# Patient Record
Sex: Male | Born: 1990 | Race: White | Hispanic: No | Marital: Single | Smoking: Former smoker
Health system: Southern US, Community
[De-identification: ages and names within clinical notes are randomized; demographics above are authoritative.]

## PROBLEM LIST (undated history)

## (undated) DIAGNOSIS — F329 Major depressive disorder, single episode, unspecified: Secondary | ICD-10-CM

## (undated) DIAGNOSIS — I10 Essential (primary) hypertension: Secondary | ICD-10-CM

## (undated) DIAGNOSIS — T7840XA Allergy, unspecified, initial encounter: Secondary | ICD-10-CM

## (undated) DIAGNOSIS — K219 Gastro-esophageal reflux disease without esophagitis: Secondary | ICD-10-CM

## (undated) DIAGNOSIS — R569 Unspecified convulsions: Secondary | ICD-10-CM

## (undated) DIAGNOSIS — F32A Depression, unspecified: Secondary | ICD-10-CM

## (undated) DIAGNOSIS — F319 Bipolar disorder, unspecified: Secondary | ICD-10-CM

## (undated) DIAGNOSIS — F419 Anxiety disorder, unspecified: Secondary | ICD-10-CM

## (undated) DIAGNOSIS — G4730 Sleep apnea, unspecified: Secondary | ICD-10-CM

## (undated) HISTORY — DX: Essential (primary) hypertension: I10

## (undated) HISTORY — DX: Major depressive disorder, single episode, unspecified: F32.9

## (undated) HISTORY — DX: Bipolar disorder, unspecified: F31.9

## (undated) HISTORY — DX: Allergy, unspecified, initial encounter: T78.40XA

## (undated) HISTORY — PX: WISDOM TOOTH EXTRACTION: SHX21

## (undated) HISTORY — PX: NO PAST SURGERIES: SHX2092

## (undated) HISTORY — DX: Depression, unspecified: F32.A

## (undated) HISTORY — DX: Unspecified convulsions: R56.9

## (undated) HISTORY — DX: Sleep apnea, unspecified: G47.30

## (undated) HISTORY — DX: Gastro-esophageal reflux disease without esophagitis: K21.9

## (undated) HISTORY — DX: Anxiety disorder, unspecified: F41.9

---
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
## 2014-06-11 ENCOUNTER — Ambulatory Visit: Payer: Self-pay | Admitting: Family Medicine

## 2014-06-20 ENCOUNTER — Emergency Department: Payer: Self-pay | Admitting: Emergency Medicine

## 2014-06-20 LAB — CBC WITH DIFFERENTIAL/PLATELET
Basophil #: 0.1 10*3/uL (ref 0.0–0.1)
Basophil %: 0.3 %
EOS ABS: 0 10*3/uL (ref 0.0–0.7)
Eosinophil %: 0.1 %
HCT: 45.9 % (ref 40.0–52.0)
HGB: 15.4 g/dL (ref 13.0–18.0)
Lymphocyte #: 2 10*3/uL (ref 1.0–3.6)
Lymphocyte %: 6.5 %
MCH: 28.4 pg (ref 26.0–34.0)
MCHC: 33.5 g/dL (ref 32.0–36.0)
MCV: 85 fL (ref 80–100)
MONO ABS: 1.6 x10 3/mm — AB (ref 0.2–1.0)
Monocyte %: 5.2 %
NEUTROS PCT: 87.9 %
Neutrophil #: 27.1 10*3/uL — ABNORMAL HIGH (ref 1.4–6.5)
PLATELETS: 241 10*3/uL (ref 150–440)
RBC: 5.4 10*6/uL (ref 4.40–5.90)
RDW: 13.3 % (ref 11.5–14.5)
WBC: 30.7 10*3/uL — ABNORMAL HIGH (ref 3.8–10.6)

## 2014-06-20 LAB — URINALYSIS, COMPLETE
BLOOD: NEGATIVE
Bilirubin,UR: NEGATIVE
Glucose,UR: NEGATIVE mg/dL (ref 0–75)
LEUKOCYTE ESTERASE: NEGATIVE
NITRITE: NEGATIVE
Ph: 5 (ref 4.5–8.0)
Protein: 30
Specific Gravity: 1.038 (ref 1.003–1.030)
Squamous Epithelial: NONE SEEN
WBC UR: 1 /HPF (ref 0–5)

## 2014-06-20 LAB — BASIC METABOLIC PANEL
Anion Gap: 12 (ref 7–16)
BUN: 18 mg/dL
CALCIUM: 9.4 mg/dL
Chloride: 101 mmol/L
Co2: 24 mmol/L
Creatinine: 1.15 mg/dL
EGFR (African American): >60
Glucose: 117 mg/dL — ABNORMAL HIGH
POTASSIUM: 4 mmol/L
SODIUM: 137 mmol/L

## 2014-07-02 ENCOUNTER — Ambulatory Visit
Admit: 2014-07-02 | Disposition: A | Discharge status: Home / Self Care | Payer: Self-pay | Attending: Family Medicine | Admitting: Family Medicine

## 2014-07-23 ENCOUNTER — Ambulatory Visit
Admit: 2014-07-23 | Disposition: A | Discharge status: Home / Self Care | Payer: Self-pay | Attending: Family Medicine | Admitting: Family Medicine

## 2015-02-23 ENCOUNTER — Inpatient Hospital Stay
Admit: 2015-02-23 | Discharge: 2015-02-28 | DRG: 885 | Disposition: A | Discharge status: Home / Self Care | Payer: BLUE CROSS/BLUE SHIELD | Source: Intra-hospital | Attending: Psychiatry | Admitting: Psychiatry

## 2015-02-23 ENCOUNTER — Encounter: Payer: Self-pay | Admitting: *Deleted

## 2015-02-23 ENCOUNTER — Emergency Department
Admission: EM | Admit: 2015-02-23 | Discharge: 2015-02-23 | Disposition: A | Discharge status: Other Acute Inpatient Hospital | Payer: BLUE CROSS/BLUE SHIELD | Attending: Emergency Medicine | Admitting: Emergency Medicine

## 2015-02-23 DIAGNOSIS — R45850 Homicidal ideations: Secondary | ICD-10-CM | POA: Diagnosis present

## 2015-02-23 DIAGNOSIS — I10 Essential (primary) hypertension: Secondary | ICD-10-CM | POA: Diagnosis present

## 2015-02-23 DIAGNOSIS — R45851 Suicidal ideations: Secondary | ICD-10-CM

## 2015-02-23 DIAGNOSIS — F32A Depression, unspecified: Secondary | ICD-10-CM

## 2015-02-23 DIAGNOSIS — F332 Major depressive disorder, recurrent severe without psychotic features: Principal | ICD-10-CM | POA: Diagnosis present

## 2015-02-23 DIAGNOSIS — E663 Overweight: Secondary | ICD-10-CM | POA: Diagnosis present

## 2015-02-23 DIAGNOSIS — F419 Anxiety disorder, unspecified: Secondary | ICD-10-CM | POA: Diagnosis present

## 2015-02-23 DIAGNOSIS — F329 Major depressive disorder, single episode, unspecified: Secondary | ICD-10-CM | POA: Diagnosis not present

## 2015-02-23 DIAGNOSIS — G4700 Insomnia, unspecified: Secondary | ICD-10-CM | POA: Diagnosis present

## 2015-02-23 LAB — CBC
HCT: 45.2 % (ref 40.0–52.0)
HEMOGLOBIN: 15.3 g/dL (ref 13.0–18.0)
MCH: 27.7 pg (ref 26.0–34.0)
MCHC: 33.8 g/dL (ref 32.0–36.0)
MCV: 82 fL (ref 80.0–100.0)
Platelets: 188 10*3/uL (ref 150–440)
RBC: 5.51 MIL/uL (ref 4.40–5.90)
RDW: 12.9 % (ref 11.5–14.5)
WBC: 7.1 10*3/uL (ref 3.8–10.6)

## 2015-02-23 LAB — COMPREHENSIVE METABOLIC PANEL
ALBUMIN: 4.4 g/dL (ref 3.5–5.0)
ALK PHOS: 71 U/L (ref 38–126)
ALT: 58 U/L (ref 17–63)
AST: 28 U/L (ref 15–41)
Anion gap: 8 (ref 5–15)
BILIRUBIN TOTAL: 0.8 mg/dL (ref 0.3–1.2)
BUN: 18 mg/dL (ref 6–20)
CO2: 28 mmol/L (ref 22–32)
Calcium: 9.9 mg/dL (ref 8.9–10.3)
Chloride: 105 mmol/L (ref 101–111)
Creatinine, Ser: 1.03 mg/dL (ref 0.61–1.24)
GFR calc Af Amer: >60 mL/min (ref >60)
GLUCOSE: 113 mg/dL — AB (ref 65–99)
Potassium: 3.9 mmol/L (ref 3.5–5.1)
Sodium: 141 mmol/L (ref 135–145)
Total Protein: 7.8 g/dL (ref 6.5–8.1)

## 2015-02-23 LAB — URINE DRUG SCREEN, QUALITATIVE (ARMC ONLY)
Amphetamines, Ur Screen: NOT DETECTED
BARBITURATES, UR SCREEN: NOT DETECTED
Benzodiazepine, Ur Scrn: NOT DETECTED
CANNABINOID 50 NG, UR ~~LOC~~: NOT DETECTED
COCAINE METABOLITE, UR ~~LOC~~: NOT DETECTED
MDMA (ECSTASY) UR SCREEN: NOT DETECTED
Methadone Scn, Ur: NOT DETECTED
Opiate, Ur Screen: NOT DETECTED
PHENCYCLIDINE (PCP) UR S: NOT DETECTED
TRICYCLIC, UR SCREEN: NOT DETECTED

## 2015-02-23 LAB — TSH: TSH: 3.197 u[IU]/mL (ref 0.350–4.500)

## 2015-02-23 LAB — ETHANOL: Alcohol, Ethyl (B): <5 mg/dL (ref <5)

## 2015-02-23 MED ORDER — FLUOXETINE HCL 20 MG PO CAPS
20.0000 mg | ORAL_CAPSULE | Freq: Every day | ORAL | Status: DC
  Administered 2015-02-23 – 2015-02-28 (×6): 20 mg via ORAL
  Filled 2015-02-23 (×6): qty 1

## 2015-02-23 MED ORDER — ACETAMINOPHEN 325 MG PO TABS: 650.0000 mg | ORAL_TABLET | Freq: Four times a day (QID) | ORAL | Status: DC | PRN

## 2015-02-23 MED ORDER — FLUOXETINE HCL 20 MG PO CAPS: 20.0000 mg | ORAL_CAPSULE | Freq: Every day | ORAL | Status: DC

## 2015-02-23 MED ORDER — MAGNESIUM HYDROXIDE 400 MG/5ML PO SUSP: 30.0000 mL | Freq: Every day | ORAL | Status: DC | PRN

## 2015-02-23 MED ORDER — ALUM & MAG HYDROXIDE-SIMETH 200-200-20 MG/5ML PO SUSP: 30.0000 mL | ORAL | Status: DC | PRN
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
## 2015-02-23 NOTE — ED Notes (Signed)
Pt ready for transport to the unit downstairs. Waiting on an officer to escort the pt. Report has been called to Dole FoodCaroline RN.

## 2015-02-23 NOTE — ED Notes (Signed)
Pt requesting to speak to a chaplain. Chaplain being paged at this time. Pt made aware.
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
## 2015-02-23 NOTE — ED Notes (Signed)
Pt speaking with psychiatrist
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
## 2015-02-23 NOTE — ED Notes (Signed)
Pt sitting in day room watching tv. No complaints.   Q 15 min checks continue. Monitored for safety.

## 2015-02-23 NOTE — ED Notes (Signed)
Pt asked to make a phone call. Went to room to make call.

## 2015-02-23 NOTE — Progress Notes (Signed)
   02/23/15 1745  Clinical Encounter Type  Visited With Patient  Visit Type Initial;Behavioral Health  Referral From Nurse  Consult/Referral To Chaplain  Spiritual Encounters  Spiritual Needs Literature;Prayer;Emotional  Stress Factors  Patient Stress Factors Family relationships  [facility]. Upset about recent break up in relationship. Wanted someone to talk to and say a prayer. Chaplain Cordelia PocheEmily Syed Zukas
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
## 2015-02-23 NOTE — ED Notes (Signed)
Pt eating diner in his room.

## 2015-02-23 NOTE — Discharge Instructions (Signed)
You were evaluated today for depression and anxiety with some suicidal thoughts. After evaluation by the psychiatrist Dr. Toni Amendlapacs, he is recommending you start on fluoxetine and has provided a prescription for 1 month. You do need to follow up with a psychiatrist, and you may find a private psychiatrist through your insurance referral, or at [facility].  Also follow up with your counselor in the next 1-2 days.  Return to the emergency department immediately for any worsening depression, or any thoughts of wanting to hurt or kill yourself or others.    Major Depressive Disorder Major depressive disorder is a mental illness. It also may be called clinical depression or unipolar depression. Major depressive disorder usually causes feelings of sadness, hopelessness, or helplessness. Some people with this disorder do not feel particularly sad but lose interest in doing things they used to enjoy (anhedonia). Major depressive disorder also can cause physical symptoms. It can interfere with work, school, relationships, and other normal everyday activities. The disorder varies in severity but is longer lasting and more serious than the sadness we all feel from time to time in our lives. Major depressive disorder often is triggered by stressful life events or major life changes. Examples of these triggers include divorce, loss of your job or home, a move, and the death of a family member or close friend. Sometimes this disorder occurs for no obvious reason at all. People who have family members with major depressive disorder or bipolar disorder are at higher risk for developing this disorder, with or without life stressors. Major depressive disorder can occur at any age. It may occur just once in your life (single episode major depressive disorder). It may occur multiple times (recurrent major depressive disorder). SYMPTOMS People with major depressive disorder have either anhedonia or depressed mood on nearly a daily  basis for at least 2 weeks or longer. Symptoms of depressed mood include:  Feelings of sadness (blue or down in the dumps) or emptiness.  Feelings of hopelessness or helplessness.  Tearfulness or episodes of crying (may be observed by others).  Irritability (children and adolescents). In addition to depressed mood or anhedonia or both, people with this disorder have at least four of the following symptoms:  Difficulty sleeping or sleeping too much.   Significant change (increase or decrease) in appetite or weight.   Lack of energy or motivation.  Feelings of guilt and worthlessness.   Difficulty concentrating, remembering, or making decisions.  Unusually slow movement (psychomotor retardation) or restlessness (as observed by others).   Recurrent wishes for death, recurrent thoughts of self-harm (suicide), or a suicide attempt. People with major depressive disorder commonly have persistent negative thoughts about themselves, other people, and the world. People with severe major depressive disorder may experiencedistorted beliefs or perceptions about the world (psychotic delusions). They also may see or hear things that are not real (psychotic hallucinations). DIAGNOSIS Major depressive disorder is diagnosed through an assessment by your health care provider. Your health care provider will ask aboutaspects of your daily life, such as mood,sleep, and appetite, to see if you have the diagnostic symptoms of major depressive disorder. Your health care provider may ask about your medical history and use of alcohol or drugs, including prescription medicines. Your health care provider also may do a physical exam and blood work. This is because certain medical conditions and the use of certain substances can cause major depressive disorder-like symptoms (secondary depression). Your health care provider also may refer you to a mental health specialist for  further evaluation and  treatment. TREATMENT It is important to recognize the symptoms of major depressive disorder and seek treatment. The following treatments can be prescribed for this disorder:   Medicine. Antidepressant medicines usually are prescribed. Antidepressant medicines are thought to correct chemical imbalances in the brain that are commonly associated with major depressive disorder. Other types of medicine may be added if the symptoms do not respond to antidepressant medicines alone or if psychotic delusions or hallucinations occur.  Talk therapy. Talk therapy can be helpful in treating major depressive disorder by providing support, education, and guidance. Certain types of talk therapy also can help with negative thinking (cognitive behavioral therapy) and with relationship issues that trigger this disorder (interpersonal therapy). A mental health specialist can help determine which treatment is best for you. Most people with major depressive disorder do well with a combination of medicine and talk therapy. Treatments involving electrical stimulation of the brain can be used in situations with extremely severe symptoms or when medicine and talk therapy do not work over time. These treatments include electroconvulsive therapy, transcranial magnetic stimulation, and vagal nerve stimulation.   This information is not intended to replace advice given to you by your health care provider. Make sure you discuss any questions you have with your health care provider.   Document Released: 07/07/2012 Document Revised: 04/02/2014 Document Reviewed: 07/07/2012 Elsevier Interactive Patient Education 2016 [employer].   Suicidal Feelings: How to Help Yourself Suicide is the taking of one's own life. If you feel as though life is getting too tough to handle and are thinking about suicide, get help right away. To get help:  Call your local emergency services (911 in the [place]  Call a suicide hotline to speak with a  trained counselor who understands how you are feeling. The following is a list of suicide hotlines in the [place]. For a list of hotlines in [place], visit InkDistributor.it.  1-800-273-TALK 959-572-8924).  1-800-SUICIDE 8138440091).  310 145 3250. This is a hotline for Spanish speakers.  6-644-034-7QQV (574) 667-0969). This is a hotline for TTY users.  1-866-4-U-TREVOR 223-161-0601). This is a hotline for lesbian, gay, bisexual, transgender, or questioning youth.  Contact a crisis center or a local suicide prevention center. To find a crisis center or suicide prevention center:  Call your local hospital, clinic, community service organization, mental health center, social service provider, or health department. Ask for assistance in connecting to a crisis center.  Visit https://www.patel-king.com/ for a list of crisis centers in the [place], or visit www.suicideprevention.ca/thinking-about-suicide/find-a-crisis-centre for a list of centers in [place].  Visit the following websites:  National Suicide Prevention Lifeline: www.suicidepreventionlifeline.org  Hopeline: www.hopeline.com  [employer] for Suicide Prevention: https://www.ayers.com/  [employer] (for lesbian, gay, bisexual, transgender, or questioning youth): www.thetrevorproject.org HOW CAN I HELP MYSELF FEEL BETTER?  Promise yourself that you will not do anything drastic when you have suicidal feelings. Remember, there is hope. Many people have gotten through suicidal thoughts and feelings, and you will, too. You may have gotten through them before, and this proves that you can get through them again.  Let family, friends, teachers, or counselors know how you are feeling. Try not to isolate yourself from those who care about you. Remember, they will want to help you. Talk with someone every day, even if you do not  feel sociable. Face-to-face conversation is best.  Call a mental health professional and see one regularly.  Visit your primary health care provider every year.  Eat a well-balanced diet,  and space your meals so you eat regularly.  Get plenty of rest.  Avoid alcohol and drugs, and remove them from your home. They will only make you feel worse.  If you are thinking of taking a lot of medicine, give your medicine to someone who can give it to you one day at a time. If you are on antidepressants and are concerned you will overdose, let your health care provider know so he or she can give you safer medicines. Ask your mental health professional about the possible side effects of any medicines you are taking.  Remove weapons, poisons, knives, and anything else that could harm you from your home.  Try to stick to routines. Follow a schedule every day. Put self-care on your schedule.  Make a list of realistic goals, and cross them off when you achieve them. Accomplishments give a sense of worth.  Wait until you are feeling better before doing the things you find difficult or unpleasant.  Exercise if you are able. You will feel better if you exercise for even a half hour each day.  Go out in the sun or into nature. This will help you recover from depression faster. If you have a favorite place to walk, go there.  Do the things that have always given you pleasure. Play your favorite music, read a good book, paint a picture, play your favorite instrument, or do anything else that takes your mind off your depression if it is safe to do.  Keep your living space well lit.  When you are feeling well, write yourself a letter about tips and support that you can read when you are not feeling well.  Remember that life's difficulties can be sorted out with help. Conditions can be treated. You can work on thoughts and strategies that serve you well.   This information is not intended to replace advice  given to you by your health care provider. Make sure you discuss any questions you have with your health care provider.   Document Released: 09/16/2002 Document Revised: 04/02/2014 Document Reviewed: 07/07/2013 Elsevier Interactive Patient Education [employer]

## 2015-02-23 NOTE — ED Notes (Signed)
Pt wanting to speak with a psychiatrist regarding his IVC.  Pt informed there was no psychiatrist at this time.  RN speaking with pt to answers his questions.  Q 15 min checks continue. Monitored for safety.

## 2015-02-23 NOTE — Tx Team (Addendum)
Initial Interdisciplinary Treatment Plan   PATIENT STRESSORS: Legal issue Marital or family conflict   PATIENT STRENGTHS: Capable of independent living Physical Health   PROBLEM LIST: Problem List/Patient Goals Date to be addressed Date deferred Reason deferred Estimated date of resolution  Depression  11/30           Suicide Ideas  11/30            "work on my coping skills"                               DISCHARGE CRITERIA:  Ability to meet basic life and health needs Improved stabilization in mood, thinking, and/or behavior  PRELIMINARY DISCHARGE PLAN: Attend aftercare/continuing care group Return to previous living arrangement  PATIENT/FAMIILY INVOLVEMENT: This treatment plan has been presented to and reviewed with the patient, Chase Hampton, and/or family member, .  The patient and family have been given the opportunity to ask questions and make suggestions.  Ignacia FellingJennifer A Morrow 02/23/2015, 6:23 PM
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
## 2015-02-23 NOTE — ED Notes (Signed)
Pt eating diner in his room. 

## 2015-02-23 NOTE — Progress Notes (Signed)
Pt. is to be admitted to [facility] by Dr. Toni Amendlapacs. Attending Physician will be [name].  Pt. has been assigned to room 316, by [facility] Charge Nurse Gwen.  Intake Paper Work has been signed and placed on pt. chart. ER staff Rivka Barbara( [facility].; Dr. Shaune PollackLord, ER MD;  Amy Patient's Nurse &  Renee Patient Access) have been made aware of the admission.  02/23/2015 Cheryl FlashNicole Cameren Earnest, MS, NCC, LPCA Therapeutic Triage Specialist

## 2015-02-23 NOTE — ED Notes (Signed)
Pt arrives POV with SI and HI thoughts. Pt was recently dx with depression. Pt and his fiance called off their engagement and ended the relationship which seemed to have sparked SI and HI thoughts. Pt tearful. Pt arrives with his parents. Pt pleasant and cooperative at this time. No hx of such thoughts.

## 2015-02-23 NOTE — ED Notes (Signed)
Pt speaking with chaplain 

## 2015-02-23 NOTE — ED Notes (Signed)
[phone] Chase Hampton, mother. Pt states that it is okay to release medical information to mother.

## 2015-02-24 ENCOUNTER — Encounter: Payer: Self-pay | Admitting: Psychiatry

## 2015-02-24 LAB — LIPID PANEL
Cholesterol: 167 mg/dL (ref 0–200)
HDL: 41 mg/dL (ref >40)
LDL Cholesterol: 110 mg/dL — ABNORMAL HIGH (ref 0–99)
Total CHOL/HDL Ratio: 4.1 RATIO
Triglycerides: 81 mg/dL (ref <150)
VLDL: 16 mg/dL (ref 0–40)

## 2015-02-24 LAB — TSH: TSH: 4.591 u[IU]/mL — AB (ref 0.350–4.500)

## 2015-02-24 MED ORDER — TRAZODONE HCL 100 MG PO TABS
100.0000 mg | ORAL_TABLET | Freq: Every evening | ORAL | Status: DC | PRN
  Administered 2015-02-24 (×2): 100 mg via ORAL
  Filled 2015-02-24 (×2): qty 1

## 2015-02-24 NOTE — Progress Notes (Signed)
Recreation Therapy Notes  INPATIENT RECREATION THERAPY ASSESSMENT  Patient Details Name: Gelene MinkStewart Wasco Vanepps MRN: [id number] DOB: 12/03/1990 Today's Date: 02/24/2015  Patient Stressors: Family, Relationship, Death, Work, Other (Comment) (Parents can be stressful - just moved back in with parents; fiance just broke up with him; fiance's grandfather just passed away; just started a new job 7 months ago and had a stressful weekend recently; was planning a wedding )  Coping Skills:   Isolate, Arguments, Avoidance, Talking, Music, Sports, Other (Comment) (Netflix, play games, play with dog)  Personal Challenges: Anger, Communication, Concentration, Expressing Yourself, Relationships, Self-Esteem/Confidence, Social Interaction, Stress Management, Time Management, Trusting Others  Leisure Interests (2+):  Individual - Other (Comment) (Watch sports, go to the movies, play games, play with dog)  Awareness of Community Resources:  Yes  Community Resources:  Research scientist (physical sciences)Movie Theaters, [employer]  Current Use: Yes  If no, Barriers?:    Patient Strengths:  Medical sales representativempathetic, intelligent  Patient Identified Areas of Improvement:  Self-esteem, learning to deal with stress better  Current Recreation Participation:  Not much  Patient Goal for Hospitalization:  To build self-esteem and work on Social research officer, governmentsocial axiety and try to understand what was going on  [place]idence[place]  [place]  County of Residence:  [place]   Current SI (including self-harm):  No  Current HI:  No  Consent to Intern Participation: N/A   Jacquelynn CreeGreene,Issabela Lesko M, LRT/CTRS 02/24/2015, 4:54 PM

## 2015-02-24 NOTE — [facility] Group Notes (Signed)
[facility] Group Notes:  (Nursing/MHT/Case Management/Adjunct)  Date:  02/24/2015  Time:  2:06 PM  Type of Therapy:  Group Therapy  Participation Level:  Active  Participation Quality:  Appropriate, Attentive and Sharing  Affect:  Appropriate  Cognitive:  Alert, Appropriate and Oriented  Insight:  Appropriate  Engagement in Group:  Engaged  Modes of Intervention:  Activity  Summary of Progress/Problems:  English Tomer De'Chelle Kashawna Manzer 02/24/2015, 2:06 PM

## 2015-02-24 NOTE — Progress Notes (Signed)
Pt is angry and upset. When approached, states he was tricked into coming here by the doctor. States he has no thoughts of hurting himself. Pt reports having a gun, states he would never use it to hurt himself. Med compliant. Attends group. No behavior problems noted. Denies SI/HI/AV/H. No c/o pain/discomfort.   At 0200, pt  c/o difficulty sleeping and racing thoughts. Dr Ardyth HarpsHernandez notified. N.O. Trazodone 100 mg po given at 0235 PRN for sleep. Slept 5.45 hours. Will continue to monitor for safety and behavior.

## 2015-02-24 NOTE — Progress Notes (Signed)
Patient noted to be anxious. He asked about getting discharged and stated that there was a big misunderstanding that made him to be brought here. Patient remained cooperative with unit routine and attended groups.

## 2015-02-24 NOTE — Plan of Care (Signed)
Problem: Alteration in mood Goal: LTG-Patient reports reduction in suicidal thoughts (Patient reports reduction in suicidal thoughts and is able to verbalize a safety plan for whenever patient is feeling suicidal)  Outcome: Progressing Pt denies SI at this time  Problem: Diagnosis: Increased Risk For Suicide Attempt Goal: LTG-Patient Will Report Improved Mood and Deny Suicidal LTG (by discharge) Patient will report improved mood and deny suicidal ideation.  Outcome: Progressing Pt stated he was feeling better today than yesterday

## 2015-02-24 NOTE — Progress Notes (Signed)
Recreation Therapy Notes  Date: 12.01.16 Time: 3:00 pm Location: Craft Room  Group Topic: Leisure Education  Goal Area(s) Addresses:  Patient will identify activities for each letter of the alphabet. Patient will verbalize ability to integrate positive leisure into life post d/c. Patient will verbalize ability to use leisure as a Associate Professorcoping skill.  Behavioral Response: Attentive, Interactive  Intervention: Leisure Alphabet  Activity: Patients were given a leisure Information systems manageralphabet worksheet and instructed to write positive leisure activities for each letter of the alphabet.  Education: LRT educated patients on what they need to participate in leisure  Education Outcome: Acknowledges education/In group clarification offered  Clinical Observations/Feedback: Patient completed activity by writing healthy leisure activities down. Patient contributed to group discussion by stating some healthy leisure activities.  Jacquelynn CreeGreene,Carvell Hoeffner M, LRT/CTRS 02/24/2015 4:13 PM

## 2015-02-24 NOTE — Progress Notes (Signed)
D: Pt denies SI/HI/AVH. Pt is pleasant and cooperative. Pt stated he was doing better due to him "coping and dealing better" with his situations. Pt seen interacting with peers in the dayroom this evening.  A: Pt was offered support and encouragement. Pt was given scheduled medications. Pt was encourage to attend groups. Q 15 minute checks were done for safety.   R:Pt attends groups and interacts well with peers and staff. Pt is taking medication. Pt has no complaints at this time .Pt receptive to treatment and safety maintained on unit.

## 2015-02-24 NOTE — Plan of Care (Signed)
Problem: Diagnosis: Increased Risk For Suicide Attempt Goal: STG-Patient Will Comply With Medication Regime Outcome: Not Met (add Reason) Pt remains anxious. Med compliant. No voiced thoughts of hurting himself. q 15 min checks maintained for safety. Will continue to monitor for behavior.
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
## 2015-02-24 NOTE — [facility] Suicide Risk Assessment (Signed)
[facility]   Nursing information obtained from:    Demographic factors:    Current Mental Status:    Loss Factors:    Historical Factors:    Risk Reduction Factors:    Total Time spent with patient: 1 hour Principal Problem: Severe recurrent major depression without psychotic features [facility](HCC) Diagnosis:   Patient Active Problem List   Diagnosis Date Noted  . Severe recurrent major depression without psychotic features (HCC) [F33.2] 02/23/2015     Continued Clinical Symptoms:  Alcohol Use Disorder Identification Test Final Score (AUDIT): 0 The "Alcohol Use Disorders Identification Test", Guidelines for Use in Primary Care, Second Edition.  World Science writerHealth Organization [facility](WHO). Score between 0-7:  no or low risk or alcohol related problems. Score between 8-15:  moderate risk of alcohol related problems. Score between 16-19:  high risk of alcohol related problems. Score 20 or above:  warrants further diagnostic evaluation for alcohol dependence and treatment.   CLINICAL FACTORS:   Severe Anxiety and/or Agitation Depression:   Aggression    Psychiatric Specialty Exam: Physical Exam  ROS    COGNITIVE FEATURES THAT CONTRIBUTE TO RISK:  None    SUICIDE RISK:   Mild:  Suicidal ideation of limited frequency, intensity, duration, and specificity.  There are no identifiable plans, no associated intent, mild dysphoria and related symptoms, good self-control (both objective and subjective assessment), few other risk factors, and identifiable protective factors, including available and accessible social support.  [facility]  Medical Decision Making:  Established Problem, Worsening (2)  I certify that inpatient services furnished can reasonably be expected to improve the patient's condition.   Jimmy FootmanHernandez-Gonzalez,  Meral Geissinger 02/24/2015, 4:09 PM

## 2015-02-24 NOTE — Plan of Care (Signed)
Problem: Diagnosis: Increased Risk For Suicide Attempt Goal: STG-Patient Will Attend All Groups On The Unit Outcome: Progressing Patient attended groups today.

## 2015-02-25 LAB — HEMOGLOBIN A1C: HEMOGLOBIN A1C: 5.2 % (ref 4.0–6.0)

## 2015-02-25 MED ORDER — TRAZODONE HCL 150 MG PO TABS: 150.0000 mg | ORAL_TABLET | Freq: Every day | ORAL | Status: DC

## 2015-02-25 MED ORDER — TRAZODONE HCL 50 MG PO TABS
150.0000 mg | ORAL_TABLET | Freq: Every day | ORAL | Status: DC
  Administered 2015-02-25 – 2015-02-27 (×3): 150 mg via ORAL
  Filled 2015-02-25 (×3): qty 1

## 2015-02-25 MED ORDER — FLUOXETINE HCL 20 MG PO CAPS: 20.0000 mg | ORAL_CAPSULE | Freq: Every day | ORAL | Status: DC

## 2015-02-25 NOTE — Progress Notes (Signed)
Pt's mood is euthymic this AM with congruent affect. Pt denies any SI/HI/VAH, said did not sleep too well last night; pt denies any other concerns, remains complaint with meds and meals. Trazodone added to pt's regimen for sleep. Pt's 72hr release expires on Monday 01/28/15 @ 1536, will continue to monitor for safety.

## 2015-02-25 NOTE — Plan of Care (Signed)
Problem: [facility] Participation in Recreation Therapeutic Interventions Goal: STG-Patient will demonstrate improved self esteem by identif STG: Self-Esteem - Within 3 treatment sessions, patient will verbalize at least 5 positive affirmation statements in one treatment session to increase self-esteem post d/c.  Outcome: Completed/Met Date Met:  02/25/15 Treatment Session 1; Completed 1 out of 1: At approximately 12:00 pm, LRT met with patient in craft room. Patient verbalized 5 positive affirmation statements. Patient reported it felt "good". LRT encouraged patient to continue saying positive affirmation statements. Intervention Used: I Am statements   M , LRT/CTRS 12.02.16 1:47 pm Goal: STG-Patient will identify at least five coping skills for ** STG: Coping Skills - Within 3 treatment sessions, patient will verbalize at least 5 coping skills for anger in one treatment session to increase anger management skills post d/c.  Outcome: Completed/Met Date Met:  02/25/15 Treatment Session 1; Completed 1 out of 1: At approximately 12:00 pm, LRT met with patient in craft room. Patient verbalized 5 coping skills for anger. Patient verbalized what triggers him to get angry, how his body responds to anger, and how he is going to remember to use his healthy coping skills. LRT provided suggestions as well. Intervention Used: Coping Skills worksheet   M , LRT/CTRS 12.02.16 1:48 pm Goal: STG-Other Recreation Therapy Goal (Specify) STG: Stress Management - Within 3 treatment sessions, patient will verbalize understanding of the stress management techniques in one treatment session to increase stress management skills post d/c.  Outcome: Completed/Met Date Met:  02/25/15 Treatment Session 1; Completed 1 out of 1: At approximately 12:00 pm, LRT met with patient in craft room. LRT educated and provided patient with handouts on stress management techniques. Patient verbalized understanding.  LRT encouraged patient to read over and practice the stress management technqiues. Intervention Used: Stress Management handouts   M , LRT/CTRS 12.02.16 1:49 pm     

## 2015-02-25 NOTE — Progress Notes (Signed)
[facility] MD Progress Note  02/25/2015 4:18 PM Chase Hampton  MRN:  [id number] Subjective:  Patient reports feeling much better. He is requesting discharge. He denies having any suicidal thoughts since Tuesday of this week. He denies any thoughts of wanting to harm anybody else. He denies issues with appetite, energy or concentration. As far as his sleep is that he had a fair night of sleep last night. He denies side effects from the medications. He denies having any physical complaints today. Patient has been seen attending groups.  Patient signed a 72 hour request for yesterday afternoon. He will expire on Sunday afternoon around 3:00pm.    Per patient to come he had has been removed by his family and is on a locked storage inside a jewelry store.  Per nursing D: Pt denies SI/HI/AVH. Pt is pleasant and cooperative. Pt stated he was doing better due to him "coping and dealing better" with his situations. Pt seen interacting with peers in the dayroom this evening.  A: Pt was offered support and encouragement. Pt was given scheduled medications. Pt was encourage to attend groups. Q 15 minute checks were done for safety.   R:Pt attends groups and interacts well with peers and staff. Pt is taking medication. Pt has no complaints at this time .Pt receptive to treatment and safety maintained on unit.  Principal Problem: Severe recurrent major depression without psychotic features (HCC) Diagnosis:   Patient Active Problem List   Diagnosis Date Noted  . Severe recurrent major depression without psychotic features (HCC) [F33.2] 02/23/2015   Total Time spent with patient: 30 minutes    Past Psychiatric History: Patient has never had a psychiatric hospitalization. No history of suicide attempts. Patient describes some aggression as noted above. Denies ever having been arrested on any assault charges. Never has been prescribed any medicine for psychiatric treatment in the past.   Past Medical  History:Patient is overweight and has been told by his doctor that he was on the borderline for diabetes but has no diagnosed medical problems. No current medication. Patient reports history of multiple concussions while playing football.  States during college he had a shoulder injury which limited his ability to do sports, and as result of that he did gain 60 pounds.  Family History: History reviewed. No pertinent family history.  Family Psychiatric History:  Social History: Engineer, maintenance (IT) works in Firefighter. Had been involved with the same woman since high school. This is the woman with whom he has the problematic relationship. Currently has been residing for the last couple days back with his biological parents. Denies history of legal problems. Patient just graduated from college in May he has a Neurosurgeon in business and Firefighter. He currently works for [employer] in [place]. History        Sleep: Fair  Appetite:  Good  Current Medications: Current Facility-Administered Medications  Medication Dose Route Frequency Provider Last Rate Last Dose  . acetaminophen (TYLENOL) tablet 650 mg  650 mg Oral Q6H PRN Audery Amel, MD      . alum & mag hydroxide-simeth (MAALOX/MYLANTA) 200-200-20 MG/5ML suspension 30 mL  30 mL Oral Q4H PRN Audery Amel, MD      . FLUoxetine (PROZAC) capsule 20 mg  20 mg Oral Daily Audery Amel, MD   20 mg at 02/25/15 0929  . magnesium hydroxide (MILK OF MAGNESIA) suspension 30 mL  30 mL Oral Daily PRN Audery Amel, MD      .  traZODone (DESYREL) tablet 150 mg  150 mg Oral QHS Jimmy Footman, MD        Lab Results:  Results for orders placed or performed during the hospital encounter of 02/23/15 (from the past 48 hour(s))  Hemoglobin A1c     Status: None   Collection Time: 02/24/15  6:56 AM  Result Value Ref Range   Hgb A1c MFr Bld 5.2 4.0 - 6.0 %  Lipid panel, fasting     Status: Abnormal   Collection  Time: 02/24/15  6:56 AM  Result Value Ref Range   Cholesterol 167 0 - 200 mg/dL   Triglycerides 81 <161 mg/dL   HDL 41 >09 mg/dL   Total CHOL/HDL Ratio 4.1 RATIO   VLDL 16 0 - 40 mg/dL   LDL Cholesterol 604 (H) 0 - 99 mg/dL    Comment:        Total Cholesterol/HDL:CHD Risk Coronary Heart Disease Risk Table                     Men   Women  1/2 Average Risk   3.4   3.3  Average Risk       5.0   4.4  2 X Average Risk   9.6   7.1  3 X Average Risk  23.4   11.0        Use the calculated Patient Ratio above and the CHD Risk Table to determine the patient's CHD Risk.        ATP III CLASSIFICATION (LDL):  <100     mg/dL   Optimal  540-981  mg/dL   Near or Above                    Optimal  130-159  mg/dL   Borderline  191-478  mg/dL   High  >295     mg/dL   Very High   TSH     Status: Abnormal   Collection Time: 02/24/15  6:56 AM  Result Value Ref Range   TSH 4.591 (H) 0.350 - 4.500 uIU/mL    Physical Findings: AIMS: Facial and Oral Movements Muscles of Facial Expression: None, normal Lips and Perioral Area: None, normal Jaw: None, normal Tongue: None, normal,Extremity Movements Upper (arms, wrists, hands, fingers): None, normal Lower (legs, knees, ankles, toes): None, normal, Trunk Movements Neck, shoulders, hips: None, normal, Overall Severity Severity of abnormal movements (highest score from questions above): None, normal Incapacitation due to abnormal movements: None, normal Patient's awareness of abnormal movements (rate only patient's report): No Awareness, Dental Status Current problems with teeth and/or dentures?: No Does patient usually wear dentures?: No  CIWA:    COWS:     Musculoskeletal: Strength & Muscle Tone: within normal limits Gait & Station: normal Patient leans: N/A  Psychiatric Specialty Exam: Review of Systems  Constitutional: Negative.   HENT: Negative.   Eyes: Negative.   Respiratory: Negative.   Cardiovascular: Negative.    Gastrointestinal: Negative.   Genitourinary: Negative.   Musculoskeletal: Negative.   Skin: Negative.   Neurological: Negative.   Endo/Heme/Allergies: Negative.   Psychiatric/Behavioral: Negative.     Blood pressure 138/88, pulse 92, temperature 98.1 F (36.7 C), temperature source Oral, resp. rate 18, height  (1.753 m), weight 127.007 kg (280 lb), SpO2 98 %.Body mass index is 41.33 kg/(m^2).  General Appearance: Well Groomed  Eye Contact::  Good  Speech:  Clear and Coherent  Volume:  Normal  Mood:  Euthymic  Affect:  Congruent  Thought Process:  Linear  Orientation:  Full (Time, Place, and Person)  Thought Content:  Hallucinations: None  Suicidal Thoughts:  No  Homicidal Thoughts:  No  Memory:  Immediate;   Good Recent;   Good Remote;   Good  Judgement:  Fair  Insight:  Fair  Psychomotor Activity:  Normal  Concentration:  Good  Recall:  Good  Fund of Knowledge:Good  Language: Good  Akathisia:  No  Handed:    AIMS (if indicated):     Assets:  ArchitectCommunication Skills Financial Resources/Insurance Housing Social Support  ADL's:  Intact  Cognition: WNL  Sleep:  Number of Hours: 7.3   Treatment Plan Summary: Daily contact with patient to assess and evaluate symptoms and progress in treatment and Medication management   24 year old single Caucasian male who presented to the emergency department voicing worsening depression, thoughts of suicide and some homicidal ideation towards his girlfriend. These worsening of symptoms was triggered after his girlfriend of 5 years broke up with him. Patient reports a long history of anger issues that started in childhood. The patient also has history of multiple concussions due to playing football. Per reports from the patient's family he was holding a gun and threatening to kill himself and her.  Major depressive disorder : patient has been is started on fluoxetine 20 mg by mouth daily to target depressive symptoms and irritability  . The patient has received education about this medication. And potential side effects of SSRIs including day risk of increasing suicidality in young adults. Tolerating SSRI well  Insomnia: I will increase trazodone to 150 mg by mouth daily at bedtime as patient did not sleep last night  Hypertension: Blood pressure has been mildly elevated since admission. Will continue to monitor and see if there is any need of antihypertensives  Collateral information will be obtained from patient's family  Precautions every 15 minute checks  Hospitalization status: Patient was made voluntary on December 1 thing he signed a 72 hour discharge requests that day.  72h  will expire on Sunday evening  Discharge planning: Once a stable patient will be discharged back to his parents  Discharge follow-up: Patient will be discharged to follow with psychiatry and will try to arrange anger management.  Jimmy FootmanHernandez-Gonzalez,  Shivaan Tierno 02/25/2015, 4:18 PM

## 2015-02-25 NOTE — Plan of Care (Signed)
Problem: Consults Goal: Suicide Risk Patient Education (See Patient Education module for education specifics)  Outcome: Progressing Suicide behavior not identified, pt denies any suicide ideations at this time.

## 2015-02-25 NOTE — Progress Notes (Signed)
Recreation Therapy Notes  Date: 12.02.16 Time: 3:00 pm Location: Craft Room  Group Topic: Problem solving, Communication, Teamwork  Goal Area(s) Addresses:  Patient will effectively work with peer towards shared goal. Patient will identify skills used to make activity successful. Patient will identify benefit of using group skills effectively post d/c.  Behavioral Response: Arrived late, Attentive, Interactive  Intervention: Pipe Stage managerCleaner Tower  Activity: Patients were given 2 minutes to strategize on how they were going to build a free standing tower out of 15 pipe cleaners. After approximately 5 minutes of building, patients were instructed to put their dominant hand behind their back. After approximately 4 minutes, patients were instructed not to talk to each other.  Education: LRT educated patients on healthy support systems.  Education Outcome: Acknowledges education/In group clarification offered  Clinical Observations/Feedback: Patient arrived to group at approximately 3:37 pm. Patient contributed to group discussion by stating what prevents him from using communication, problem solving, and teamwork at home.  Jacquelynn CreeGreene,Clemence Lengyel M, LRT/CTRS 02/25/2015 5:00 PM

## 2015-02-26 DIAGNOSIS — F332 Major depressive disorder, recurrent severe without psychotic features: Principal | ICD-10-CM

## 2015-02-26 NOTE — Tx Team (Signed)
Interdisciplinary Treatment Plan Update (Adult)  Date:  02/26/2015 Time Reviewed:  11:41 AM  Progress in Treatment: Attending groups: Yes. Participating in groups:  Yes. Taking medication as prescribed:  Yes. Tolerating medication:  Yes. Family/Significant othe contact made:  No, will contact:  Parents  Patient understands diagnosis:  Yes. Discussing patient identified problems/goals with staff:  Yes. Medical problems stabilized or resolved:  Yes. Denies suicidal/homicidal ideation: Yes. Issues/concerns per patient self-inventory:  No. Other:  New problem(s) identified: No, Describe:  NA  Discharge Plan or Barriers: Pt plans to return home and follow up with outpatient.   Reason for Continuation of Hospitalization: Anxiety Depression Medication stabilization Suicidal ideation  Comments:Patient reports feeling much better. He is requesting discharge. He denies having any suicidal thoughts since Tuesday of this week. He denies any thoughts of wanting to harm anybody else. He denies issues with appetite, energy or concentration. As far as his sleep is that he had a fair night of sleep last night. He denies side effects from the medications. He denies having any physical complaints today. Patient has been seen attending groups. Patient signed a 72 hour request for yesterday afternoon. He will expire on Sunday afternoon around 3:00pm.    Estimated length of stay: Pt will d/c on Sunday.   New goal(s): NA  Review of initial/current patient goals per problem list:   1.  Goal(s): Patient will participate in aftercare plan * Met:  * Target date: at discharge * As evidenced by: Patient will participate within aftercare plan AEB aftercare provider and housing plan at discharge being identified.   2.  Goal (s): Patient will exhibit decreased depressive symptoms and suicidal ideations. * Met:  *  Target date: at discharge * As evidenced by: Patient will utilize self rating of  depression at 3 or below and demonstrate decreased signs of depression or be deemed stable for discharge by MD.   3.  Goal(s): Patient will demonstrate decreased signs and symptoms of anxiety. * Met:  * Target date: at discharge * As evidenced by: Patient will utilize self rating of anxiety at 3 or below and demonstrated decreased signs of anxiety, or be deemed stable for discharge by MD    Attendees: Patient:  Chase Hampton 12/3/201611:41 AM  Family:   12/3/201611:41 AM  Physician:  Dr. Jerilee Hoh   12/3/201611:41 AM  Nursing:   Marcie Bal, RN 12/3/201611:41 AM  Case Manager:   12/3/201611:41 AM  Counselor:   12/3/201611:41 AM  Other:  Wray Kearns, LCSWA 12/3/201611:41 AM  Other:  Everitt Amber, LRT  12/3/201611:41 AM  Other:   12/3/201611:41 AM  Other:  12/3/201611:41 AM  Other:  12/3/201611:41 AM  Other:  12/3/201611:41 AM  Other:  12/3/201611:41 AM  Other:  12/3/201611:41 AM  Other:  12/3/201611:41 AM  Other:   12/3/201611:41 AM   Scribe for Treatment Team:   Campbell Stall Maraki Macquarrie,MSW, [place]  02/26/2015, 11:41 AM

## 2015-02-26 NOTE — Progress Notes (Signed)
Patient will be seen by Dr Jenne CampusMcQueen due to conflict of interest with this Clinical research associatewriter.

## 2015-02-26 NOTE — Progress Notes (Signed)
D: Observed pt in day room interacting with peers. Patient alert and oriented x4. Patient denies SI/HI/AVH. Pt affect is anxious. Pt talked at length about problems leading to current admission. Pt states " I'm feeling good and optimistic." Pt hopefull for discharge tomorrow. Pt stated " I've been able to use positive coping skills." A: Offered active listening and support. Provided therapeutic communication. Administered scheduled medications. Encouraged continual use of positive coping skills post discharge. Discussed plan for SI post-discharge is situation becomes overwhelming. R: Patient endorsed keeping distance from ex-GF, living with parents, and continual use of coping skills as a plan. Pt endorsed positive support system. Will continue Q15 min. checks.
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
## 2015-02-26 NOTE — [facility] Group Notes (Signed)
[facility] Group Notes:  (Nursing/MHT/Case Management/Adjunct)  Date:  02/26/2015  Time:  3:32 PM  Type of Therapy:  Psychoeducational Skills  Participation Level:  Active  Participation Quality:  Appropriate, Attentive and Sharing  Affect:  Appropriate  Cognitive:  Alert and Appropriate  Insight:  Appropriate and Good  Engagement in Group:  Engaged  Modes of Intervention:  Discussion, Education and Support  Summary of Progress/Problems:  Lynelle SmokeCara Travis Bobbie Valletta 02/26/2015, 3:32 PM
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
## 2015-02-26 NOTE — Progress Notes (Addendum)
D:  Per pt self inventory pt reports sleeping good, appetite good, energy level normal, ability to pay attention is good, rates depression at a 2 out of 10, denies hopelessness, rates anxiety at a 2 out of 10,  pt states that he is hoping he will get to go home today, states that he signed a 72 hour request for discharge that will be up at 3:30pm tomorrow 12/4, denies SI/HI/AVH, anxious during interaction. Goal to "Read info about coping and place it into action, talk with the social worker and MD"  A:  Emotional support provided, Encouraged pt to continue with treatment plan and attend all group activities, q15 min checks maintained for safety.  R:  Pt is receptive, going to groups, visible in the milieu and interacts appropriately, pt states that he feels he is ready to go home because he has "good supportive family and friends at home" and he plans to live with his parents after discharge.

## 2015-02-26 NOTE — Plan of Care (Signed)
Problem: Alteration in mood Goal: STG-Patient is able to discuss feelings and issues (Patient is able to discuss feelings and issues leading to depression)  Outcome: Progressing Pt discussed issues and feelings leading to depression and current admission.

## 2015-02-26 NOTE — Progress Notes (Addendum)
[facility] MD Progress Note  02/26/2015 3:04 PM Chase Hampton  MRN:  [id number]    Subjective:  Chase Hampton is a 24 y.o. with a recent diagnosis of depression who states he is doing better today. He states he is doing well overall. He is medication compliant and without medication side effects. He is eating well and sleeping better with Trazodone  po QHS for insomnia.   Nursing notes reviewed.   Chase Hampton shared his reasons for admission. He noted the occurences from last week were irrational.  He discussed grabbing gun and discussing wanting to shoot himself and his ex-fiance. The pt denies holding the gun to his head or otherwise. His parents were able to get the gun away from the pt and he did no harm to himself or anyone else. He states the gun is now at his brother-in-law's jewelry store locked in a safe where the pt is not able to access it. When he came to [facility] ED, he was IVC'd because he wanted to leave. He also was not honest about obtaining the gun and threatening to harm himself and others. Pt shared his engagement ended Thursday.   Today the pt states medications and inpatient psychiatric therapy have been very helpful.  Chase Hampton shared he does not want to contact his fiance after discharge from the hospital. He states he wants to give her a letter (after discharge) to discuss some of his feelings and to let her know he is out of the hospital and doing well.   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 He lives with his parents and will return to their home upon discharge.  Pt denies SI, HI and AVH.   SW shared there are no outpatient appts set up just yet for the patient. He has a therapist, Madaline Guthrie at [facility]. Pt normally has a an appt with him each Wednesday but pt feels he  may have attend therapy twice per week. He also does not have a psychiatrist at this time but wants to see a psychiatrist at [facility].    Principal Problem: Severe recurrent major depression without psychotic features (HCC) Diagnosis:   Patient Active Problem List   Diagnosis Date Noted  . Severe recurrent major depression without psychotic features (HCC) [F33.2] 02/23/2015   Total Time spent with patient: 30 minutes    Past Psychiatric History: Patient has never had a psychiatric hospitalization. No history of suicide attempts. Patient describes some aggression as noted above. Denies ever having been arrested on any assault charges. Never has been prescribed any medicine for psychiatric treatment in the past.   Past Medical History:Patient is overweight and has been told by his doctor that he was on the borderline for diabetes but has no diagnosed medical problems. No current medication. Patient reports history of multiple concussions while playing football.  States during college he had a shoulder injury which limited his ability to do sports, and as result of that he did gain 60 pounds.  Family History: History reviewed. No pertinent family history.  Family Psychiatric History:  Social History: Engineer, maintenance (IT) works in Firefighter. Had been involved with the same woman since high school. This is the woman with whom he has the problematic relationship. Currently has been residing for the last couple days back with his biological parents.  Denies history of legal problems. Patient just graduated from college in May he has a Neurosurgeonbachelor degrees in business and Firefighterinformation technology. He currently works for [employer] in [place]. History        Sleep: Fair  Appetite:  Good  Current Medications: Current Facility-Administered Medications  Medication Dose Route Frequency Provider Last Rate Last Dose  . acetaminophen (TYLENOL) tablet 650 mg  650 mg Oral Q6H PRN Audery AmelJohn T Clapacs, MD       . alum & mag hydroxide-simeth (MAALOX/MYLANTA) 200-200-20 MG/5ML suspension 30 mL  30 mL Oral Q4H PRN Audery AmelJohn T Clapacs, MD      . FLUoxetine (PROZAC) capsule 20 mg  20 mg Oral Daily Audery AmelJohn T Clapacs, MD   20 mg at 02/26/15 0813  . magnesium hydroxide (MILK OF MAGNESIA) suspension 30 mL  30 mL Oral Daily PRN Audery AmelJohn T Clapacs, MD      . traZODone (DESYREL) tablet 150 mg  150 mg Oral QHS Jimmy FootmanAndrea Hernandez-Gonzalez, MD   150 mg at 02/25/15 2237    Lab Results:  No results found for this or any previous visit (from the past 48 hour(s)).  Physical Findings: AIMS: Facial and Oral Movements Muscles of Facial Expression: None, normal Lips and Perioral Area: None, normal Jaw: None, normal Tongue: None, normal,Extremity Movements Upper (arms, wrists, hands, fingers): None, normal Lower (legs, knees, ankles, toes): None, normal, Trunk Movements Neck, shoulders, hips: None, normal, Overall Severity Severity of abnormal movements (highest score from questions above): None, normal Incapacitation due to abnormal movements: None, normal Patient's awareness of abnormal movements (rate only patient's report): No Awareness, Dental Status Current problems with teeth and/or dentures?: No Does patient usually wear dentures?: No  CIWA:    COWS:     Musculoskeletal: Strength & Muscle Tone: within normal limits Gait & Station: normal Patient leans: N/A  Psychiatric Specialty Exam: Review of Systems  Constitutional: Negative.   HENT: Negative.   Eyes: Negative.   Respiratory: Negative.   Cardiovascular: Negative.   Gastrointestinal: Negative.   Genitourinary: Negative.   Musculoskeletal: Negative.   Skin: Negative.   Neurological: Negative.   Endo/Heme/Allergies: Negative.   Psychiatric/Behavioral: Negative.     Blood pressure 94/76, pulse 80, temperature 98.2 F (36.8 C), temperature source Oral, resp. rate 18, height 5\' 9"  (1.753 m), weight 127.007 kg (280 lb), SpO2 98 %.Body mass index is 41.33  kg/(m^2).  General Appearance: Well Groomed  Patent attorneyye Contact::  Good  Speech:  Clear and Coherent  Volume:  Normal  Mood:  Euthymic  Affect:  Congruent  Thought Process:  Linear  Orientation:  Full (Time, Place, and Person)  Thought Content:  Hallucinations: None  Suicidal Thoughts:  No  Homicidal Thoughts:  No  Memory:  Immediate;   Good Recent;   Good Remote;   Good  Judgement:  Fair  Insight:  Fair  Psychomotor Activity:  Normal  Concentration:  Good  Recall:  Good  Fund of Knowledge:Good  Language: Good  Akathisia:  No  Handed:    AIMS (if indicated):     Assets:  ArchitectCommunication Skills Financial Resources/Insurance Housing Social Support  ADL's:  Intact  Cognition: WNL  Sleep:  Number of Hours: 6.5   Treatment Plan Summary: Daily contact with patient to assess and evaluate symptoms and progress in treatment and Medication management   24 year old single Caucasian male who presented to the emergency department voicing worsening depression, thoughts of suicide and some homicidal ideation towards his girlfriend. Pt feels he is improving but there  an adequate  safety plan has not been developed at this point. Outpatient psychiatric appointments need to be confirmed and the pt's knife collection should be removed from the home.   Plan:  1. Major depressive disorder, single episode: patient has    - Continue fluoxetine  po daily to target depressive symptoms and irritability .   2. Insomnia: improving   - Continue trazodone  po QHS   3. Hypertension: Blood pressure has been mildly elevated since admission. Will continue to monitor and see if there is any need of antihypertensives  4. Precautions every 15 minute checks  5. Hospitalization status: Patient was made voluntary on December 1 thing he signed a 72 hour discharge requests that day.  72h  will expire on Sunday evening  6. Discharge planning: Once a stable patient will be discharged back to his parents  7.  Discharge follow-up: Patient will be discharged to follow with psychiatry and will try to arrange anger management.  Gena Fray 02/26/2015, 3:04 PM

## 2015-02-27 NOTE — Plan of Care (Signed)
Problem: Diagnosis: Increased Risk For Suicide Attempt Goal: LTG-Patient Will Report Improved Mood and Deny Suicidal LTG (by discharge) Patient will report improved mood and deny suicidal ideation.  Outcome: Progressing Pt denies SI Goal: STG-Patient Will Comply With Medication Regime Outcome: Progressing Pt taking medications as prescribed     

## 2015-02-27 NOTE — Progress Notes (Signed)
D) Patient pleasant and cooperative upon my assessment. Patient did complete Self Inventory Assessment and rates depression as  0 /10, patient rates hopeless feelings as 0 /10.  Patient denies SI/HI, denies A/V hallucinations.  Patient's affect is sad and mood is appropriate.  Reports that  His appetite is good.  Patient was scheduled to D/C per 72hour hold.  Patient was IVC'd per Dr. Tami Ribas, MD explained reason for IVC, patient received information without incident.   A) Patient offered support and encouragement, patient encouraged to discuss feelings/concerns with staff. Patient verbalized understanding. Patient monitored Q15 minutes for safety. Patient met with MD  to discuss today's goals and plan of care.  R) Patient visible in milieu, he attended groups today.  Patient come to dining room for meals and snacks. Patient appropriate with staff and peers.   Patient taking medications as ordered. Will continue to monitor.

## 2015-02-27 NOTE — Progress Notes (Signed)
[facility]  02/27/2015 4:27 PM Chase Hampton  MRN:  [id number]    Subjective:  Chase Hampton  Giller is a 24 y.o. with a recent diagnosis of depression who states he is doing better today. He states he is doing well overall. He is medication compliant and without medication side effects. He is eating well and sleeping better with Trazodone 150mg  po QHS for insomnia.   Nursing notes reviewed.   Chase Hampton discussed he is currently doing well. He is medication compliant and without medication side effects. Sleep and appetite are stable. Pt does wake with some anxiety in the morning but states that has been occuring since Friday with anticipation of discharge. He contacted his ex-fiance today by telephone and it was decided between the two of them, they will not speak. Pt denies SI, HI and AVH.    Principal Problem: Severe recurrent major depression without psychotic features (HCC) Diagnosis:   Patient Active Problem List   Diagnosis Date Noted  . Severe recurrent major depression without psychotic features (HCC) [F33.2] 02/23/2015   Total Time spent with patient: 30 minutes    Past Psychiatric History: Patient has never had a psychiatric hospitalization. No history of suicide attempts. Patient describes some aggression as noted above. Denies ever having been arrested on any assault charges. Never has been prescribed any medicine for psychiatric treatment in the past.   Past Medical History:Patient is overweight and has been told by his doctor that he was on the borderline for diabetes but has no diagnosed medical problems. No current medication. Patient reports history of multiple concussions while playing football.  States during college he had a shoulder injury which limited his ability to do sports, and as result of that he did gain 60 pounds.  Family History: History reviewed. No pertinent family history.  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 Had been involved with the same woman since high school. This is the woman with whom he has the problematic relationship. Currently has been residing for the last couple days back with his biological parents. Denies history of legal problems. Patient just graduated from college in May he has a Neurosurgeonbachelor degrees in business and Firefighterinformation technology. He currently works for [employer] in [place]. History        Sleep: Fair  Appetite:  Good  Current Medications: Current Facility-Administered Medications  Medication Dose Route Frequency Provider Last Rate Last Dose  . acetaminophen (TYLENOL) tablet 650 mg  650 mg Oral Q6H PRN Chase AmelJohn T Clapacs, MD      . alum & mag hydroxide-simeth (MAALOX/MYLANTA) 200-200-20 MG/5ML suspension 30 mL  30 mL Oral Q4H PRN Chase AmelJohn T Clapacs, MD      . FLUoxetine (PROZAC) capsule 20 mg  20 mg Oral Daily Chase AmelJohn T Clapacs, MD   20 mg at 02/27/15 0935  . magnesium hydroxide (MILK OF MAGNESIA) suspension 30 mL  30 mL Oral Daily PRN Chase AmelJohn T Clapacs, MD      . traZODone (DESYREL) tablet 150 mg  150 mg Oral QHS Chase FootmanAndrea Hernandez-Gonzalez, MD   150 mg at 02/26/15 2253    Lab Results:  No results found for this or any previous visit (from the past 48 hour(s)).  Physical Findings: AIMS: Facial and Oral Movements Muscles of Facial Expression: None, normal Lips and Perioral Area: None, normal Jaw: None, normal Tongue: None, normal,Extremity Movements Upper (arms, wrists, hands, fingers): None, normal Lower (legs, knees, ankles, toes): None, normal, Trunk Movements  Neck, shoulders, hips: None, normal, Overall Severity Severity of abnormal movements (highest score from questions above): None, normal Incapacitation due to abnormal movements: None, normal Patient's awareness of abnormal movements (rate only patient's report): No Awareness, Dental Status Current problems with teeth and/or dentures?: No Does patient usually wear  dentures?: No  CIWA:    COWS:     Musculoskeletal: Strength & Muscle Tone: within normal limits Gait & Station: normal Patient leans: N/A  Psychiatric Specialty Exam: Review of Systems  Constitutional: Negative.   HENT: Negative.   Eyes: Negative.   Respiratory: Negative.   Cardiovascular: Negative.   Gastrointestinal: Negative.   Genitourinary: Negative.   Musculoskeletal: Negative.   Skin: Negative.   Neurological: Negative.   Endo/Heme/Allergies: Negative.   Psychiatric/Behavioral: Negative.     Blood pressure 113/82, pulse 92, temperature 98.2 F (36.8 C), temperature source Oral, resp. rate 18, height  (1.753 m), weight 127.007 kg (280 lb), SpO2 98 %.Body mass index is 41.33 kg/(m^2).  General Appearance: Well Groomed  Patent attorney::  Good  Speech:  Clear and Coherent  Volume:  Normal  Mood:  Euthymic  Affect:  Congruent  Thought Process:  Linear  Orientation:  Full (Time, Place, and Person)  Thought Content:  Hallucinations: None  Suicidal Thoughts:  No  Homicidal Thoughts:  No  Memory:  Immediate;   Good Recent;   Good Remote;   Good  Judgement:  Fair  Insight:  Fair  Psychomotor Activity:  Normal  Concentration:  Good  Recall:  Good  Fund of Knowledge:Good  Language: Good  Akathisia:  No  Handed:    AIMS (if indicated):     Assets:  Architect Housing Social Support  ADL's:  Intact  Cognition: WNL  Sleep:  Number of Hours: 7   Treatment Plan Summary: Daily contact with patient to assess and evaluate symptoms and progress in treatment and Medication management   24 year old single Caucasian male who presented to the emergency department voicing worsening depression, thoughts of suicide and some homicidal ideation towards his girlfriend. Pt feels he is improving but there an adequate  safety plan has not been developed at this point. Outpatient psychiatric appointments need to be confirmed and the pt's  knife collection should be removed from the home.   Plan:  1. Major depressive disorder, single episode: patient has    - Continue fluoxetine  po daily to target depressive symptoms and irritability .   2. Insomnia: improving   - Continue trazodone  po QHS   3. Hypertension: Blood pressure has been mildly elevated since admission. Will continue to monitor and see if there is any need of antihypertensives  4. Precautions every 15 minute checks  5. Hospitalization status: IVC was reinstated today and it can be reversed tomorrow if pt is to be discharged.   6. Discharge planning: pt will need psychiatric appts confirmed/scheduled.   7. Discharge follow-up: Patient will be discharged to follow with psychiatry and will try to arrange anger management.  Gena Fray 02/27/2015, 4:27 PM

## 2015-02-27 NOTE — Progress Notes (Signed)
D: Pt in dayroom upon approach. Pt is pleasant and cooperative. Denies SI/HI/AVH at this time. Denies pain. Pt discusses 72 hour request for discharge that will be up tomorrow 12/4. No concerns or complaints at this time. A: Medications given as prescribed. Support and encouragement offered. q15 minute checks maintained. R: Pt remains free from harm.

## 2015-02-28 NOTE — Progress Notes (Signed)
  [facility] Discharge [facility] :  Will you be returning to the same living situation after discharge:  Yes,  Home At discharge, do you have transportation home?: Yes,  parents  Do you have the ability to pay for your medications: Yes,  Insuance, income  Release of information consent forms completed and in the chart;  Patient's signature needed at discharge.  Patient to Follow up at: Follow-up Information    [facility]  On 04/27/2015.   Why:  Your neuology appointment is on Wednesday, Feb. 1st at 10:30am. They will call you if an eariler appointment comes available. They will mail you new patient forms to complete prior to your appointment. Please bring those forms and any insurance info.    Contact information:   [address] [place], [place] [address] [phone] phone [phone] fax      [facility] On 03/29/2015.   Why:  Your hospital follow up appointment is on Tuesday, Jan. 3rd at 9:00 am. Please bring your photo ID, insurance information and medication list.    Contact information:   [address] [place], [place] [address] ([phone])  [phone]      Next level of care provider has [facility] to [facility] Link:no  Patient denies SI/HI: Yes,  Yes    Safety Planning and Suicide Prevention discussed: Yes,  with patient and parents  Have you used any form of tobacco in the last 30 days? (Cigarettes, Smokeless Tobacco, Cigars, and/or Pipes): No  Has patient been referred to the Quitline?: N/A patient is not a smoker  Sempra EnergyCandace L Erendira Crabtree MSW, LCSWA  02/28/2015, 2:29 PM

## 2015-02-28 NOTE — [facility] Group Notes (Signed)
[facility] Group Notes:  (Nursing/MHT/Case Management/Adjunct)  Date:  02/28/2015  Time:  1:57 PM  Type of Therapy:  Psychoeducational Skills  Participation Level:  Active  Participation Quality:  Appropriate  Affect:  Appropriate  Cognitive:  Appropriate  Insight:  Good  Engagement in Group:  Engaged  Modes of Intervention:  Support  Summary of Progress/Problems:  Mayra NeerJackie L Aadya Kindler 02/28/2015, 1:57 PM

## 2015-02-28 NOTE — Discharge Summary (Addendum)
Physician Discharge Summary Note  Patient:  Chase Hampton is an 24 y.o., male MRN:  [id number] DOB:  04/03/1990 Patient phone:  [phone] (home)  Patient address:   [address] [place] [address],  Total Time spent with patient: 45 minutes  Date of Admission:  02/23/2015 Date of Discharge: 03/01/2015   Principal Problem: Severe recurrent major depression without psychotic features [facility]) Discharge Diagnoses: Patient Active Problem List   Diagnosis Date Noted  . Severe recurrent major depression without psychotic features ([place]) [F33.2] 02/23/2015      Past Psychiatric History: Patient has never had a psychiatric hospitalization. No history of suicide attempts. Patient describes some aggression as noted above. Denies ever having been arrested on any assault charges. Never has been prescribed any medicine for psychiatric treatment in the past.   Past Medical History:Patient is overweight and has been told by his doctor that he was on the borderline for diabetes but has no diagnosed medical problems. No current medication. Patient reports history of multiple concussions while playing football.  States during college he had a shoulder injury which limited his ability to do sports, and as result of that he did gain 60 pounds.  Family History: History reviewed. No pertinent family history.  Family Psychiatric History:  Social History: Forensic psychologist works in Librarian, academic. Had been involved with the same woman since high school. This is the woman with whom he has the problematic relationship. Currently has been residing for the last couple days back with his biological parents. Denies history of legal problems. Patient just graduated from college in May he has a Sports administrator in business and Librarian, academic. He currently works for [employer] in [place]. History  Alcohol Use No    History  Drug Use Not on file    Social History   Social  History  . Marital Status: Single    Spouse Name: N/A  . Number of Children: N/A  . Years of Education: N/A   Social History Main Topics  . Smoking status: Never Smoker   . Smokeless tobacco: None  . Alcohol Use: No  . Drug Use: None  . Sexual Activity: Not Asked   Other Topics Concern  . None   Social History Narrative    Allergies: No Known Allergies          Hospital Course:    24 year old single Caucasian male who presented to the emergency department voicing worsening depression, thoughts of suicide and some homicidal ideation towards his girlfriend. These worsening of symptoms was triggered after his girlfriend of 5 years broke up with him. Patient reports a long history of anger issues that started in childhood. The patient also has history of multiple concussions due to playing football. Per reports from the patient's family he was holding a gun and threatening to kill himself and her.  Major depressive disorder : patient has been  started on fluoxetine 20 mg by mouth daily to target depressive symptoms and irritability . The patient has received education about this medication. And potential side effects of SSRIs including day risk of increasing suicidality in young adults.  Insomnia the patient has been started on trazodone 100 mg by mouth daily at bedtime when necessary.   Collateral information was obtained from the patient's parents would not have any concerns about his safety prior to discharge. They confirmed that all guns have been removed from the home and they are locked in a safe. The patient's girlfriend Wells Guiles was also contacted. She was  informed that the patient was going to be discharged. Patient provided consent for asked to contact his girlfriend.  No other information was provided to her.  After the patient was hospitalized he no longer made threats against himself or anybody else.  During his stay in the unit the  patient was calm, pleasant, friendly and cooperative he did not display any unsafe behaviors. He did not require seclusion, restraints or forced medications.  Patient participated actively in groups. He had appropriate interactions with peers and with staff.  Patient is not longer meeting criteria for involuntary commitment and therefore the involuntary commitment will be discontinued.  Patient tolerated his medications well. He felt that they were beneficial. On the day of the discharge he denied SI, HI or having auditory or visual hallucinations. He denied having problems with his sleep, appetite, energy or concentration. He didn't display any symptoms consistent with mania, hypomania, paranoia, delusions or any type of psychosis. He appeared hopeful, future oriented and motivated for treatment.  A follow-up appointment will be made with psychiatry. He has been advised to continue following up with his individual therapist and to seek anger management treatment. He also will be referred to neurology for evaluation of possible traumatic brain injury as a result of having multiple concussions from playing football in the past.  Physical Findings: AIMS: Facial and Oral Movements Muscles of Facial Expression: None, normal Lips and Perioral Area: None, normal Jaw: None, normal Tongue: None, normal,Extremity Movements Upper (arms, wrists, hands, fingers): None, normal Lower (legs, knees, ankles, toes): None, normal, Trunk Movements Neck, shoulders, hips: None, normal, Overall Severity Severity of abnormal movements (highest score from questions above): None, normal Incapacitation due to abnormal movements: None, normal Patient's awareness of abnormal movements (rate only patient's report): No Awareness, Dental Status Current problems with teeth and/or dentures?: No Does patient usually wear dentures?: No  CIWA:    COWS:     Musculoskeletal: Strength & Muscle Tone: within normal limits Gait &  Station: normal Patient leans: N/A  Psychiatric Specialty Exam: Review of Systems  Constitutional: Negative.   HENT: Negative.   Eyes: Negative.   Respiratory: Negative.   Cardiovascular: Negative.   Gastrointestinal: Negative.   Genitourinary: Negative.   Musculoskeletal: Negative.   Skin: Negative.   Neurological: Negative.   Endo/Heme/Allergies: Negative.   Psychiatric/Behavioral: Negative.     Blood pressure 131/84, pulse 103, temperature 98 F (36.7 C), temperature source Oral, resp. rate 20, height '5\' 9"'  (1.753 m), weight 127.007 kg (280 lb), SpO2 98 %.Body mass index is 41.33 kg/(m^2).  General Appearance: Well Groomed  Engineer, water::  Good  Speech:  Normal Rate  Volume:  Normal  Mood:  Euthymic  Affect:  Congruent  Thought Process:  Logical  Orientation:  Full (Time, Place, and Person)  Thought Content:  Hallucinations: None  Suicidal Thoughts:  No  Homicidal Thoughts:  No  Memory:  Immediate;   Good Recent;   Good Remote;   Good  Judgement:  Fair  Insight:  Fair  Psychomotor Activity:  Normal  Concentration:  Good  Recall:  NA  Fund of Knowledge:Good  Language: Good  Akathisia:  No  Handed:    AIMS (if indicated):     Assets:  Chief Executive Officer Physical Health Social Support  ADL's:  Intact  Cognition: WNL  Sleep:  Number of Hours: 6.96    Metabolic Disorder Labs:  Lab Results  Component Value Date   HGBA1C 5.2 02/24/2015   No results found for: PROLACTIN  Lab Results  Component Value Date   CHOL 167 02/24/2015   TRIG 81 02/24/2015   HDL 41 02/24/2015   CHOLHDL 4.1 02/24/2015   VLDL 16 02/24/2015   LDLCALC 110* 02/24/2015   Results for EUAN, WANDLER [facility] (MRN [id number]) as of 02/28/2015 15:23  Ref. Range 06/20/2014 22:42 07/23/2014 13:39 02/23/2015 10:02 02/24/2015 06:56  Sodium Latest Ref Range: 135-145 mmol/L 137  141   Potassium Latest Ref Range: 3.5-5.1 mmol/L 4.0  3.9   Chloride Latest Ref Range: 101-111 mmol/L 101  105    CO2 Latest Ref Range: 22-32 mmol/L 24  28   BUN Latest Ref Range: 6-20 mg/dL 18  18   Creatinine Latest Ref Range: 0.61-1.24 mg/dL 1.15  1.03   Calcium Latest Ref Range: 8.9-10.3 mg/dL 9.4  9.9   EGFR (Non-African Amer.) Latest Ref Range: >60 mL/min >60  >60   EGFR (African American) Latest Ref Range: >60 mL/min >60  >60   Glucose Latest Ref Range: 65-99 mg/dL 117 (H)  113 (H)   Anion gap Latest Ref Range: 5-'15  12  8   ' Alkaline Phosphatase Latest Ref Range: 38-126 U/L   71   Albumin Latest Ref Range: 3.5-5.0 g/dL   4.4   AST Latest Ref Range: 15-41 U/L   28   ALT Latest Ref Range: 17-63 U/L   58   Total Protein Latest Ref Range: 6.5-8.1 g/dL   7.8   Total Bilirubin Latest Ref Range: 0.3-1.2 mg/dL   0.8   Cholesterol Latest Ref Range: 0-200 mg/dL    167  Triglycerides Latest Ref Range: <150 mg/dL    81  HDL Cholesterol Latest Ref Range: >40 mg/dL    41  LDL (calc) Latest Ref Range: 0-99 mg/dL    110 (H)  VLDL Latest Ref Range: 0-40 mg/dL    16  Total CHOL/HDL Ratio Latest Units: RATIO    4.1  WBC Latest Ref Range: 3.8-10.6 K/uL 30.7 (H)  7.1   RBC Latest Ref Range: 4.40-5.90 MIL/uL 5.40  5.51   Hemoglobin Latest Ref Range: 13.0-18.0 g/dL   15.3   HGB Latest Ref Range: 13.0-18.0 g/dL 15.4     HCT Latest Ref Range: 40.0-52.0 %   45.2   HCT Latest Ref Range: 40.0-52.0 % 45.9     MCV Latest Ref Range: 80.0-100.0 fL 85  82.0   MCH Latest Ref Range: 26.0-34.0 pg 28.4  27.7   MCHC Latest Ref Range: 32.0-36.0 g/dL 33.5  33.8   RDW Latest Ref Range: 11.5-14.5 % 13.3  12.9   Platelets Latest Ref Range: 150-440 K/uL 241  188   Neutrophils Latest Units: % 87.9     Lymphocytes Latest Units: % 6.5     Monocytes Relative Latest Units: % 5.2     Eosinophil Latest Units: % 0.1     Basophil Latest Units: % 0.3     NEUT# Latest Ref Range: 1.4-6.5 x10 3/mm 3 27.1 (H)     Lymphocyte # Latest Ref Range: 1.0-3.6 x10 3/mm 3 2.0     Monocyte # Latest Ref Range: 0.2-1.0 x10 3/mm  1.6 (H)      Eosinophils Absolute Latest Ref Range: 0.0-0.7 x10 3/mm 3 0.0     Basophils Absolute Latest Ref Range: 0.0-0.1 x10 3/mm 3 0.1     Hemoglobin A1C Latest Ref Range: 4.0-6.0 %    5.2  TSH Latest Ref Range: 0.350-4.500 uIU/mL   3.197 4.591 (H)  Bacteria Latest Ref Range: NONE SEEN  TRACE  Bilirubin,UR Latest Ref Range: NEGATIVE  Negative     Blood Latest Ref Range: NEGATIVE  Negative     Clarity - urine Unknown Clear     Color - urine Unknown Amber     Glucose,UR Latest Ref Range: 0-75 mg/dL Negative     Ketone Latest Ref Range: NEGATIVE  Trace     Leukocyte Esterase Latest Ref Range: NEGATIVE  Negative     Mucous Unknown PRESENT     Nitrite Latest Ref Range: NEGATIVE  Negative     pH Latest Ref Range: 4.5-8.0  5.0     Protein Latest Ref Range: NEGATIVE  30 mg/dL     RBC,UR Latest Ref Range: 0-5 /HPF 2 /HPF     SPECIFIC GRAVITY Latest Ref Range: 1.003-1.030  1.038     Squamous Epithelial Unknown NONE SEEN     WBC UR Latest Ref Range: 0-5 /HPF 1 /HPF     Potassium, Urine Random Latest Units: mmol/L DNP     Sodium, Urine Random Latest Units: mmol/L DNP     Chloride, Urine Random Latest Units: mmol/L DNP     Glucose, CSF Latest Units: mg/dL DNP     Alcohol, Ethyl (B) Latest Ref Range: <5 mg/dL   <5   Amphetamines, Ur Screen Latest Ref Range: NONE DETECTED    NONE DETECTED   Barbiturates, Ur Screen Latest Ref Range: NONE DETECTED    NONE DETECTED   Benzodiazepine, Ur Scrn Latest Ref Range: NONE DETECTED    NONE DETECTED   Cocaine Metabolite,Ur [place] Latest Ref Range: NONE DETECTED    NONE DETECTED   Methadone Scn, Ur Latest Ref Range: NONE DETECTED    NONE DETECTED   MDMA (Ecstasy)Ur Screen Latest Ref Range: NONE DETECTED    NONE DETECTED   Cannabinoid 50 Ng, Ur [place] Latest Ref Range: NONE DETECTED    NONE DETECTED   Opiate, Ur Screen Latest Ref Range: NONE DETECTED    NONE DETECTED   Phencyclidine (PCP) Ur S Latest Ref Range: NONE DETECTED    NONE DETECTED   Tricyclic, Ur Screen Latest Ref  Range: NONE DETECTED    NONE DETECTED   DG CHEST 2 VIEW Unknown  Rpt         Medication List    TAKE these medications      Indication   FLUoxetine 20 MG capsule  Commonly known as:  PROZAC  Take 1 capsule (20 mg total) by mouth daily.  Notes to Patient:  depression      traZODone 150 MG tablet  Commonly known as:  DESYREL  Take 1 tablet (150 mg total) by mouth at bedtime.  Notes to Patient:  Insomnia        Follow-up Information    Follow up with [facility] -Neurology  On 04/27/2015.   Why:  Your neuology appointment is on Wednesday, Feb. 1st at 10:30am. They will call you if an eariler appointment comes available. They will mail you new patient forms to complete prior to your appointment. Please bring those forms and any insurance info.    Contact information:   [address] [place], [place] [address] [phone] phone [phone] fax      Follow up with [facility] On 03/29/2015.   Why:  Your hospital follow up appointment is on Tuesday, Jan. 3rd at 9:00 am. Please bring your photo ID, insurance information and medication list.    Contact information:   [address] [place], [place]  [address] ([phone] (f)  [phone]        Signed: Hildred Priest 02/28/2015, 2:52 PM

## 2015-02-28 NOTE — Plan of Care (Signed)
Problem: Diagnosis: Increased Risk For Suicide Attempt Goal: STG-Patient Will Attend All Groups On The Unit Outcome: Progressing Patient attended groups today.      

## 2015-02-28 NOTE — Plan of Care (Signed)
Problem: Diagnosis: Increased Risk For Suicide Attempt Goal: LTG-Patient Will Report Improved Mood and Deny Suicidal LTG (by discharge) Patient will report improved mood and deny suicidal ideation.  Outcome: Progressing Pt states he is "feeling good" tonight and denies SI.

## 2015-02-28 NOTE — [facility] Group Notes (Signed)
[facility] Group Notes:  (Nursing/MHT/Case Management/Adjunct)  Date:  02/28/2015  Time:  2:55 AM  Type of Therapy:  Group Therapy  Participation Level:  Active  Participation Quality:  Appropriate  Affect:  Appropriate  Cognitive:  Appropriate  Insight:  Appropriate  Engagement in Group:  Engaged  Modes of Intervention:  n/a  Summary of Progress/Problems:  Chase Hampton 02/28/2015, 2:55 AM

## 2015-02-28 NOTE — Progress Notes (Signed)
D: Observed pt in day room interacting with peers. Patient alert and oriented x4. Patient denies SI/HI/AVH. Pt affect is depressed but improving. Pt rates depression as 3 and denies anxiety. Pt stated "I fee let down" in regards to not going home and being IVC'd, but pt stated that "I understand though" and he seems to be  taking it well. Pt feels that IVC is a temporary situation, and his expecting to be discharged tomorrow. Pt said that conversation with ex-fiance was good, but had a stressful visitation with family.    A: Offered active listening and support. Provided therapeutic communication. Administered scheduled medications. Encouraged continual use of positive coping skills post discharge.  R:  Pt remains calm and cooperative. Will continue Q15 min. checks.

## 2015-02-28 NOTE — Progress Notes (Signed)
Recreation Therapy Notes  INPATIENT RECREATION TR PLAN  Patient Details Name: Chase Hampton MRN: [id number] DOB: Feb 19, 1991 Today's Date: 02/28/2015  Rec Therapy Plan Is patient appropriate for Therapeutic Recreation?: Yes Treatment times per week: At least once a week TR Treatment/Interventions: 1:1 session, Group participation (Comment) (Appropriate participation in daily recreation therapy tx)  Discharge Criteria Pt will be discharged from therapy if:: Treatment goals are met, Discharged Treatment plan/goals/alternatives discussed and agreed upon by:: Patient/family  Discharge Summary Short term goals set: See Care Plan Short term goals met: Complete Progress toward goals comments: One-to-one attended Which groups?: Leisure education, Communication, Other (Comment) (Problem solving, teamwork) One-to-one attended: Self-esteem, anger management, stress management Reason goals not met: N/A Therapeutic equipment acquired: None Reason patient discharged from therapy: Discharge from hospital Pt/family agrees with progress & goals achieved: Yes Date patient discharged from therapy: 02/28/15   Leonette Monarch, LRT/CTRS 02/28/2015, 5:36 PM

## 2015-02-28 NOTE — [facility] Suicide Risk Assessment (Signed)
[facility]   Demographic Factors:  Male and Caucasian  Total Time spent with patient: 30 minutes   Psychiatric Specialty Exam: Physical Exam  ROS                                                         Have you used any form of tobacco in the last 30 days? (Cigarettes, Smokeless Tobacco, Cigars, and/or Pipes): No  Has this patient used any form of tobacco in the last 30 days? (Cigarettes, Smokeless Tobacco, Cigars, and/or Pipes) No  Mental Status Per Nursing Assessment::   On Admission:     Current Mental Status by Physician: Calm, pleasant, cooperative, hopeful, future oriented, has good insight. Denies any thoughts of wanting to harm himself or anybody else. Patient has a job, no prior history of suicide or violence towards others, no substance abuse issues. Has a supportive family. No access to guns this has been confirmed  Loss Factors: Loss of significant relationship  Historical Factors: Impulsivity  Risk Reduction Factors:   Sense of responsibility to family, Religious beliefs about death, Living with another person, especially a relative and Positive social support  Continued Clinical Symptoms:  Depression:   Impulsivity  Cognitive Features That Contribute To Risk:  None    Suicide Risk:  Minimal: No identifiable suicidal ideation.  Patients presenting with no risk factors but with morbid ruminations; may be classified as minimal risk based on the severity of the depressive symptoms  Principal Problem: Severe recurrent major depression without psychotic features [facility](HCC) Discharge Diagnoses:  Patient Active Problem List   Diagnosis Date Noted  . Severe recurrent major depression without psychotic features ([facility]) [F33.2] 02/23/2015    Follow-up Information    [facility]  On 04/[facility]/2015.   Why:  Your neuology appointment is on Wednesday, Feb. 1st at 10:30am. They will call you if an  eariler appointment comes available. They will mail you new patient forms to complete prior to your appointment. Please bring those forms and any insurance info.    Contact information:   [address] [place], [place] [id number] [phone] phone [phone] fax        Is patient on multiple antipsychotic therapies at discharge:  No   Has Patient had three or more failed trials of antipsychotic monotherapy by history:  No  Recommended Plan for Multiple Antipsychotic Therapies: NA    Jimmy FootmanHernandez-Gonzalez,  Bradley Bostelman 02/28/2015, 11:07 AM

## 2015-02-28 NOTE — [facility] Group Notes (Signed)
[facility]   02/28/2015 1:12 PM  Participation Quality: Patient attended and participated in group discussion appropriately sharing that his SMART goal is to "follow up with self care and use my coping skills to deal with disappointments". Patient reports he was promised discharge on Sunday and this did not happen but will be discharging today.    Mood/Affect:  Appropriate   Thoughts of Suicide:  No Will you contract for safety?   NA  Current AVH:  No  Plan for Discharge/Comments:   Patient will discharge home with follow up care scheduled for mental health outpatient care.   Lulu RidingIngle, Shaily Librizzi T, MSW, LCSWA

## 2015-02-28 NOTE — Progress Notes (Signed)
Patient's discharge instructions and medications discussed with patient and he verbalized understanding. Patient had nothing in his locker but had his own clothes/belongings that he took with him. Patient was escorted to the lobby where his parents were waiting for him. Patient had no concerns at the time of d/c.

## 2015-02-28 NOTE — Progress Notes (Signed)
Patient stated that he was anxious because he did not know what was going on as far as his discharge was concerned. Patient however stated he was going to wait to talk to his doctor this morning.   Patient spoke to his MD and is on schedule for d/c.

## 2015-03-02 ENCOUNTER — Ambulatory Visit (INDEPENDENT_AMBULATORY_CARE_PROVIDER_SITE_OTHER): Payer: BLUE CROSS/BLUE SHIELD | Admitting: Family Medicine

## 2015-03-02 ENCOUNTER — Encounter: Payer: Self-pay | Admitting: Family Medicine

## 2015-03-02 VITALS — BP 118/77 | HR 80 | Resp 16 | Ht 69.0 in | Wt 274.0 lb

## 2015-03-02 DIAGNOSIS — F332 Major depressive disorder, recurrent severe without psychotic features: Secondary | ICD-10-CM

## 2015-03-02 DIAGNOSIS — F419 Anxiety disorder, unspecified: Secondary | ICD-10-CM

## 2015-03-02 MED ORDER — LORAZEPAM 0.5 MG PO TABS: 0.5000 mg | ORAL_TABLET | Freq: Two times a day (BID) | ORAL | Status: DC | PRN

## 2015-03-02 NOTE — Patient Instructions (Signed)
Contact me if any further anxiety issues.

## 2015-03-02 NOTE — Progress Notes (Signed)
Name: Chase Hampton   MRN: [id number]    DOB: [birth year]   Date:03/02/2015       Progress Note  Subjective  Chief Complaint  Chief Complaint  Patient presents with  . Depression    Hosp FU    Depression        Associated symptoms include no myalgias and no headaches.  Here for f/u of hospitalization for depressive breakdown.  Involuntarily committed for several days.  On Prozac and Trazodone for 1 week.  He has a Psychiatric appointment on Jan. 3.  He has lost his counselor and must establish another.  He has the worst time early in AM with vivid dreams and "anxiety attack" in the AM .  No suicidal ideations. No problem-specific assessment & plan notes found for this encounter.   Past Medical History  Diagnosis Date  . GERD (gastroesophageal reflux disease)   . Anxiety   . Depression     History reviewed. No pertinent past surgical history.  Family History  Problem Relation Age of Onset  . Heart disease Maternal Grandfather   . Diabetes Maternal Grandfather   . Heart disease Paternal Grandmother     Social History   Social History  . Marital Status: Single    Spouse Name: N/A  . Number of Children: N/A  . Years of Education: N/A   Occupational History  . Not on file.   Social History Main Topics  . Smoking status: Light Tobacco Smoker    Types: Cigars  . Smokeless tobacco: Never Used  . Alcohol Use: No  . Drug Use: Yes    Special: Marijuana  . Sexual Activity: Not on file   Other Topics Concern  . Not on file   Social History Narrative     Current outpatient prescriptions:  .  FLUoxetine (PROZAC) 20 MG capsule, Take 1 capsule (20 mg total) by mouth daily., Disp: 30 capsule, Rfl: 0 .  LORazepam (ATIVAN) 0.5 MG tablet, Take 1 tablet (0.5 mg total) by mouth 2 (two) times daily as needed for anxiety., Disp: 60 tablet, Rfl: 0  No Known Allergies   Review of Systems  Constitutional: Negative for fever, chills, weight loss and malaise/fatigue.   HENT: Negative for hearing loss.   Eyes: Negative for blurred vision and double vision.  Respiratory: Negative for cough, shortness of breath and wheezing.   Cardiovascular: Negative for chest pain, palpitations and leg swelling.  Gastrointestinal: Negative for heartburn, nausea, vomiting, abdominal pain, diarrhea and blood in stool.  Genitourinary: Negative for dysuria, urgency and frequency.  Musculoskeletal: Negative for myalgias and joint pain.  Skin: Negative for rash.  Neurological: Negative for weakness and headaches.  Psychiatric/Behavioral: Positive for depression. The patient is nervous/anxious.        Depression with anxiety      Objective  Filed Vitals:   03/02/15 1417  BP: 118/77  Pulse: 80  Resp: 16  Height: _0  (1.753 m)  Weight: 274 lb (124.286 kg)    Physical Exam     Recent Results (from the past 2160 hour(s))  Comprehensive metabolic panel     Status: Abnormal   Collection Time: 02/23/15 10:02 AM  Result Value Ref Range   Sodium 141 135 - 145 mmol/L   Potassium 3.9 3.5 - 5.1 mmol/L   Chloride 105 101 - 111 mmol/L   CO2 28 22 - 32 mmol/L   Glucose, Bld 113 (H) 65 - 99 mg/dL   BUN 18 6 - 20 mg/dL  Creatinine, Ser 1.03 0.61 - 1.24 mg/dL   Calcium 9.9 8.9 - 10.3 mg/dL   Total Protein 7.8 6.5 - 8.1 g/dL   Albumin 4.4 3.5 - 5.0 g/dL   AST 28 15 - 41 U/L   ALT 58 17 - 63 U/L   Alkaline Phosphatase 71 38 - 126 U/L   Total Bilirubin 0.8 0.3 - 1.2 mg/dL   GFR calc non Af Amer >60 >60 mL/min   GFR calc Af Amer >60 >60 mL/min    Comment: (NOTE) The eGFR has been calculated using the CKD EPI equation. This calculation has not been validated in all clinical situations. eGFR's persistently <60 mL/min signify possible Chronic Kidney Disease.    Anion gap 8 5 - 15  Ethanol (ETOH)     Status: None   Collection Time: 02/23/15 10:02 AM  Result Value Ref Range   Alcohol, Ethyl (B) <5 <5 mg/dL    Comment:        LOWEST DETECTABLE LIMIT FOR SERUM  ALCOHOL IS 5 mg/dL FOR MEDICAL PURPOSES ONLY   CBC     Status: None   Collection Time: 02/23/15 10:02 AM  Result Value Ref Range   WBC 7.1 3.8 - 10.6 K/uL   RBC 5.51 4.40 - 5.90 MIL/uL   Hemoglobin 15.3 13.0 - 18.0 g/dL   HCT 45.2 40.0 - 52.0 %   MCV 82.0 80.0 - 100.0 fL   MCH 27.7 26.0 - 34.0 pg   MCHC 33.8 32.0 - 36.0 g/dL   RDW 12.9 11.5 - 14.5 %   Platelets 188 150 - 440 K/uL  Urine Drug Screen, Qualitative ([facility] only)     Status: None   Collection Time: 02/23/15 10:02 AM  Result Value Ref Range   Tricyclic, Ur Screen NONE DETECTED NONE DETECTED   Amphetamines, Ur Screen NONE DETECTED NONE DETECTED   MDMA (Ecstasy)Ur Screen NONE DETECTED NONE DETECTED   Cocaine Metabolite,Ur [place] NONE DETECTED NONE DETECTED   Opiate, Ur Screen NONE DETECTED NONE DETECTED   Phencyclidine (PCP) Ur S NONE DETECTED NONE DETECTED   Cannabinoid 50 Ng, Ur [place] NONE DETECTED NONE DETECTED   Barbiturates, Ur Screen NONE DETECTED NONE DETECTED   Benzodiazepine, Ur Scrn NONE DETECTED NONE DETECTED   Methadone Scn, Ur NONE DETECTED NONE DETECTED    Comment: (NOTE) 902  Tricyclics, urine               Cutoff 1000 ng/mL 200  Amphetamines, urine             Cutoff 1000 ng/mL 300  MDMA (Ecstasy), urine           Cutoff 500 ng/mL 400  Cocaine Metabolite, urine       Cutoff 300 ng/mL 500  Opiate, urine                   Cutoff 300 ng/mL 600  Phencyclidine (PCP), urine      Cutoff 25 ng/mL 700  Cannabinoid, urine              Cutoff 50 ng/mL 800  Barbiturates, urine             Cutoff 200 ng/mL 900  Benzodiazepine, urine           Cutoff 200 ng/mL 1000 Methadone, urine                Cutoff 300 ng/mL 1100 1200 The urine drug screen provides only a preliminary, unconfirmed 1300 analytical test result  and should not be used for non-medical 1400 purposes. Clinical consideration and professional judgment should 1500 be applied to any positive drug screen result due to possible 1600 interfering substances. A more  specific alternate chemical method 1700 must be used in order to obtain a confirmed analytical result.  1800 Gas chromato graphy / mass spectrometry (GC/MS) is the preferred 1900 confirmatory method.   TSH     Status: None   Collection Time: 02/23/15 10:02 AM  Result Value Ref Range   TSH 3.197 0.350 - 4.500 uIU/mL  Hemoglobin A1c     Status: None   Collection Time: 02/24/15  6:56 AM  Result Value Ref Range   Hgb A1c MFr Bld 5.2 4.0 - 6.0 %  Lipid panel, fasting     Status: Abnormal   Collection Time: 02/24/15  6:56 AM  Result Value Ref Range   Cholesterol 167 0 - 200 mg/dL   Triglycerides 81 <150 mg/dL   HDL 41 >40 mg/dL   Total CHOL/HDL Ratio 4.1 RATIO   VLDL 16 0 - 40 mg/dL   LDL Cholesterol 110 (H) 0 - 99 mg/dL    Comment:        Total Cholesterol/HDL:CHD Risk Coronary Heart Disease Risk Table                     Men   Women  1/2 Average Risk   3.4   3.3  Average Risk       5.0   4.4  2 X Average Risk   9.6   7.1  3 X Average Risk  23.4   11.0        Use the calculated Patient Ratio above and the CHD Risk Table to determine the patient's CHD Risk.        ATP III CLASSIFICATION (LDL):  <100     mg/dL   Optimal  100-129  mg/dL   Near or Above                    Optimal  130-159  mg/dL   Borderline  160-189  mg/dL   High  >190     mg/dL   Very High   TSH     Status: Abnormal   Collection Time: 02/24/15  6:56 AM  Result Value Ref Range   TSH 4.591 (H) 0.350 - 4.500 uIU/mL     Assessment & Plan  Problem List Items Addressed This Visit      Other   Severe recurrent major depression without psychotic features (HCC)   Relevant Medications   LORazepam (ATIVAN) 0.5 MG tablet   Acute anxiety - Primary   Relevant Medications   LORazepam (ATIVAN) 0.5 MG tablet      Meds ordered this encounter  Medications  . LORazepam (ATIVAN) 0.5 MG tablet    Sig: Take 1 tablet (0.5 mg total) by mouth 2 (two) times daily as needed for anxiety.    Dispense:  60 tablet     Refill:  0   1. Acute anxiety  - LORazepam (ATIVAN) 0.5 MG tablet; Take 1 tablet (0.5 mg total) by mouth 2 (two) times daily as needed for anxiety.  Dispense: 60 tablet; Refill: 0  2. Severe recurrent major depression without psychotic features ([place]) Keep Psych appointment Discussed counseling choices.

## 2015-03-06 ENCOUNTER — Emergency Department
Admission: EM | Admit: 2015-03-06 | Discharge: 2015-03-07 | Disposition: A | Discharge status: Other Acute Inpatient Hospital | Payer: BLUE CROSS/BLUE SHIELD | Attending: Emergency Medicine | Admitting: Emergency Medicine

## 2015-03-06 DIAGNOSIS — F29 Unspecified psychosis not due to a substance or known physiological condition: Secondary | ICD-10-CM | POA: Diagnosis not present

## 2015-03-06 DIAGNOSIS — F332 Major depressive disorder, recurrent severe without psychotic features: Secondary | ICD-10-CM | POA: Diagnosis present

## 2015-03-06 DIAGNOSIS — R000 Tachycardia, unspecified: Secondary | ICD-10-CM | POA: Diagnosis not present

## 2015-03-06 DIAGNOSIS — F911 Conduct disorder, childhood-onset type: Secondary | ICD-10-CM | POA: Insufficient documentation

## 2015-03-06 DIAGNOSIS — F23 Brief psychotic disorder: Secondary | ICD-10-CM

## 2015-03-06 DIAGNOSIS — R456 Violent behavior: Secondary | ICD-10-CM | POA: Insufficient documentation

## 2015-03-06 DIAGNOSIS — F17210 Nicotine dependence, cigarettes, uncomplicated: Secondary | ICD-10-CM | POA: Insufficient documentation

## 2015-03-06 DIAGNOSIS — R45850 Homicidal ideations: Secondary | ICD-10-CM | POA: Diagnosis present

## 2015-03-06 MED ORDER — HALOPERIDOL LACTATE 5 MG/ML IJ SOLN
INTRAMUSCULAR | Status: AC
  Administered 2015-03-06: 10 mg via INTRAMUSCULAR
  Filled 2015-03-06: qty 2

## 2015-03-06 MED ORDER — LORAZEPAM 2 MG/ML IJ SOLN
2.0000 mg | Freq: Once | INTRAMUSCULAR | Status: AC
Start: 2015-03-06 — End: 2015-03-06
  Administered 2015-03-06: 2 mg via INTRAMUSCULAR

## 2015-03-06 MED ORDER — DIPHENHYDRAMINE HCL 50 MG/ML IJ SOLN
INTRAMUSCULAR | Status: AC
  Administered 2015-03-06: 50 mg via INTRAMUSCULAR
  Filled 2015-03-06: qty 1

## 2015-03-06 MED ORDER — HALOPERIDOL LACTATE 5 MG/ML IJ SOLN
10.0000 mg | Freq: Once | INTRAMUSCULAR | Status: AC
  Administered 2015-03-06: 10 mg via INTRAMUSCULAR

## 2015-03-06 MED ORDER — LORAZEPAM 2 MG/ML IJ SOLN
INTRAMUSCULAR | Status: AC
  Administered 2015-03-06: 2 mg via INTRAMUSCULAR
  Filled 2015-03-06: qty 1

## 2015-03-06 MED ORDER — DIPHENHYDRAMINE HCL 50 MG/ML IJ SOLN
50.0000 mg | Freq: Once | INTRAMUSCULAR | Status: AC
  Administered 2015-03-06: 50 mg via INTRAMUSCULAR

## 2015-03-06 NOTE — ED Notes (Signed)
Violent restraints ordered by ed dr - pt placed in restraints. meds also ordered but held when pt appeared will to stop his violent behavior.

## 2015-03-06 NOTE — ED Notes (Signed)
While documenting restraints pt became violent in the restraints, rolling and hitting rails and trying to spit on officers. Pt medicated as ordered.

## 2015-03-06 NOTE — ED Provider Notes (Signed)
[facility] Emergency Department Provider Note     Time seen: ----------------------------------------- 10:02 PM on 03/06/2015 -----------------------------------------    I have reviewed the triage vital signs and the nursing notes.   HISTORY  Chief Complaint No chief complaint on file.    HPI Chase Hampton is a 24 y.o. male brought the ER by police for psychotic behavior and violence. According to report he got into an altercation with his father and then stated he was getting in his car to go kill his fiance and her family which lives very close by. Police were called and isn't the patient he's been belligerent and violent since he was taken into custody.   Past Medical History  Diagnosis Date  . GERD (gastroesophageal reflux disease)   . Anxiety   . Depression     Patient Active Problem List   Diagnosis Date Noted  . Acute anxiety 03/02/2015  . Severe recurrent major depression without psychotic features (HCC) 02/23/2015    No past surgical history on file.  Allergies Review of patient's allergies indicates no known allergies.  Social History Social History  Substance Use Topics  . Smoking status: Light Tobacco Smoker    Types: Cigars  . Smokeless tobacco: Never Used  . Alcohol Use: No    Review of Systems Constitutional: Negative for fever. Eyes: Negative for visual changes. ENT: Negative for sore throat. Cardiovascular: Negative for chest pain. Respiratory: Negative for shortness of breath. Gastrointestinal: Negative for abdominal pain, vomiting and diarrhea. Genitourinary: Negative for dysuria. Musculoskeletal: Negative for back pain. Skin: Negative for rash. Neurological: Negative for headaches, focal weakness or numbness. Psychiatric: Patient denies suicidal or homicidal ideation.  10-point ROS otherwise negative.  ____________________________________________   PHYSICAL EXAM:  VITAL SIGNS: ED Triage  Vitals  Enc Vitals Group     BP --      Pulse --      Resp --      Temp --      Temp src --      SpO2 --      Weight --      Height --      Head Cir --      Peak Flow --      Pain Score --      Pain Loc --      Pain Edu? --      Excl. in GC? --     Constitutional: Alert and oriented, patient uncooperative but in no distress. Eyes: Conjunctivae are normal. PERRL. Normal extraocular movements. ENT   Head: Normocephalic and atraumatic.   Nose: No congestion/rhinnorhea.   Mouth/Throat: Mucous membranes are moist.   Neck: No stridor. Cardiovascular: Rapid rate, regular rhythm. Normal and symmetric distal pulses are present in all extremities. No murmurs, rubs, or gallops. Respiratory: Normal respiratory effort without tachypnea nor retractions. Breath sounds are clear and equal bilaterally. No wheezes/rales/rhonchi. Gastrointestinal: Soft and nontender. No distention. No abdominal bruits.  Musculoskeletal: Nontender with normal range of motion in all extremities. No joint effusions.  No lower extremity tenderness nor edema. Neurologic:  Normal speech and language. No gross focal neurologic deficits are appreciated. Skin:  Skin is warm, dry and intact. No rash noted. Psychiatric: Patient with bizarre mood and affect, stating he will only speak to someone with an IQ as high as his. Patient denies suicidal or homicidal ideation at this time. ____________________________________________  ED COURSE:  Pertinent labs & imaging results that were available during my care of the patient  were reviewed by me and considered in my medical decision making (see chart for details). Patient is having to be put in physical and chemical restraints as he will not comply with even reasonable commands. ____________________________________________    LABS (pertinent positives/negatives)  Labs Reviewed - No data to display  ____________________________________________  FINAL ASSESSMENT  AND PLAN  Acute psychosis, aggressive behavior  Plan: Patient with labs and imaging as dictated above. Patient be involuntarily committed, he did receive Haldol, Ativan, Benadryl and is currently in physical restraints due to violent behavior.   Emily FilbertWilliams, Jonathan E, MD   Emily FilbertJonathan E Williams, MD 03/06/15 [phone]

## 2015-03-06 NOTE — ED Notes (Signed)
Pt has eyes closed but is wide awake - voice is forced, and pt has hands clenched even though he states feels calmer.

## 2015-03-06 NOTE — BH Assessment (Addendum)
Assessment Note  Chase Hampton is an 24 y.o. male. Chase Hampton report to the ED by way of the police department because he is stressed by his ex-fiance.  He Got a phone call from her and she upset him. He reports that he went out for a drive and when he got home the police were there. He states that the police told him that they were IVC and bringing him to the hospital .  He reports "a little" depression. He reports that he is anxious with racing thoughts and jitteryiness.  He denied having auditory or visual hallucinations. He denied suicidal or homicidal ideation or intent.  He denied the use of alcohol or drugs. IVC paperwork reports that Chase Hampton, Chase Hampton stated that "I am going to kill her Chase Hampton) and kill the two of them Chase Hampton and Chase Hampton).  He is reported as seeing mobile crisis 03/05/2015.   Diagnosis: Depression  Past Medical History:  Past Medical History  Diagnosis Date  . GERD (gastroesophageal reflux disease)   . Anxiety   . Depression     No past surgical history on file.  Family History:  Family History  Problem Relation Age of Onset  . Heart disease Maternal Grandfather   . Diabetes Maternal Grandfather   . Heart disease Paternal Grandmother     Social History:  reports that he has been smoking Cigars.  He has never used smokeless tobacco. He reports that he uses illicit drugs (Marijuana). He reports that he does not drink alcohol.  Additional Social History:  Alcohol / Drug Use History of alcohol / drug use?: Yes Substance #1 Name of Substance 1: Alcohol 1 - Age of First Use: 17 1 - Amount (size/oz): 1 beer 1 - Frequency: about once a month 1 - Last Use / Amount: 3 weeks ago  CIWA: CIWA-Ar BP: 130/67 mmHg Pulse Rate: (!) 132 COWS:    Allergies: No Known Allergies  Home Medications:  (Not in a hospital admission)  OB/GYN Status:  No LMP for male patient.  General Assessment Data Location of Assessment: [facility] ED TTS  Assessment: In system Is this a Tele or Face-to-Face Assessment?: Face-to-Face Is this an Initial Assessment or a Re-assessment for this encounter?: Initial Assessment Marital status: Single Maiden name: n/a Is patient pregnant?: No Pregnancy Status: No Living Arrangements: Parent Can pt return to current living arrangement?: Yes Admission Status: Involuntary Is patient capable of signing voluntary admission?: Yes Referral Source: Self/Family/Friend Insurance type: BCBS  Medical Screening Exam [facility] Walk-in ONLY) Medical Exam completed: Yes  Crisis Care Plan Living Arrangements: Parent Legal Guardian:  (None) Name of Psychiatrist: Appt. Scheduled for [facility] (Not seen as yet) Name of Therapist: [facility]   Education Status Is patient currently in school?: No Current Grade: n/a Highest grade of school patient has completed: Bachelors degree Name of school: [employer] person: n/a  Risk to self with the past 6 months Suicidal Ideation: No Has patient been a risk to self within the past 6 months prior to admission? : Yes Suicidal Intent: No-Not Currently/Within Last 6 Months Has patient had any suicidal intent within the past 6 months prior to admission? : Yes Is patient at risk for suicide?: No Suicidal Plan?: No-Not Currently/Within Last 6 Months Has patient had any suicidal plan within the past 6 months prior to admission? : Yes Access to Means: No What has been your use of drugs/alcohol within the last 12 months?: Reports use  of alcohol about once a month Previous Attempts/Gestures: No How many times?: 0 Other Self Harm Risks: denied Triggers for Past Attempts: Family contact (ex- fiance conflicts) Intentional Self Injurious Behavior: None Family Suicide History: No Recent stressful life event(s): Conflict (Comment) (ex-fiance) Persecutory voices/beliefs?: No Depression: Yes Depression Symptoms: Feeling  angry/irritable Substance abuse history and/or treatment for substance abuse?: No Suicide prevention information given to non-admitted patients: Not applicable  Risk to Others within the past 6 months Homicidal Ideation: No Does patient have any lifetime risk of violence toward others beyond the six months prior to admission? : Yes (comment) Thoughts of Harm to Others: No-Not Currently Present/Within Last 6 Months Current Homicidal Intent: No-Not Currently/Within Last 6 Months Current Homicidal Plan: No-Not Currently/Within Last 6 Months Access to Homicidal Means: No Identified Victim: None reported History of harm to others?: No Assessment of Violence: None Noted Violent Behavior Description: denied Does patient have access to weapons?: No Criminal Charges Pending?: No Does patient have a court date: No Is patient on probation?: No  Psychosis Hallucinations: None noted Delusions: None noted  Mental Status Report Appearance/Hygiene: Unremarkable Eye Contact: Poor Motor Activity: Unremarkable Speech: Logical/coherent Level of Consciousness: Quiet/awake Mood: Irritable Affect: Anxious Anxiety Level: Minimal Thought Processes: Coherent Judgement: Partial Orientation: Person, Place, Time, Situation Obsessive Compulsive Thoughts/Behaviors: None  Cognitive Functioning Concentration: Normal Memory: Recent Intact IQ: Average Insight: Poor Impulse Control: Poor Appetite: Fair Sleep: Decreased Vegetative Symptoms: None  ADLScreening [facility]) Patient's cognitive ability adequate to safely complete daily activities?: Yes Patient able to express need for assistance with ADLs?: Yes Independently performs ADLs?: Yes (appropriate for developmental age)  Prior Inpatient Therapy Prior Inpatient Therapy: No  Prior Outpatient Therapy Prior Outpatient Therapy: Yes Prior Therapy Dates: Current Prior Therapy Facilty/Provider(s): [facility]  Reason  for Treatment: Depression  Does patient have an ACCT team?: No Does patient have Intensive In-House Services?  : No Does patient have [facility] services? : No Does patient have P4CC services?: No  ADL Screening (condition at time of admission) Patient's cognitive ability adequate to safely complete daily activities?: Yes Patient able to express need for assistance with ADLs?: Yes Independently performs ADLs?: Yes (appropriate for developmental age)       Abuse/Neglect Assessment (Assessment to be complete while patient is alone) Physical Abuse: Denies Verbal Abuse: Denies Sexual Abuse: Denies Exploitation of patient/patient's resources: Denies Self-Neglect: Denies Values / Beliefs Cultural Requests During Hospitalization: None Spiritual Requests During Hospitalization: None        Additional Information 1:1 In Past 12 Months?: Yes CIRT Risk: Yes Elopement Risk: Yes Does patient have medical clearance?: Yes     Disposition:  Disposition Initial Assessment Completed for this Encounter: Yes Disposition of Patient: Other dispositions  On Site Evaluation by:   Reviewed with Physician:    Justice DeedsKeisha Dejohn Ibarra 03/06/2015 11:29 PM

## 2015-03-06 NOTE — ED Notes (Addendum)
Pt resting quietly; currently calm and cooperative; following commands; Stacy, hospital orderly now sitting with pt; says pt is known to her from his last visit; he has contracted for safety and understands any acting out after restraints removed will mean restraints will have to be placed back on his limbs; lower limb restraints removed at this time as trial; pt appreciative, calm and cooperative; given sips of water; upper limbs remain in place at this time

## 2015-03-06 NOTE — ED Notes (Signed)
Evaluated last week for trying to kill his ex fiance and her kids - today threatening to hurt them again and was stalking her. Violent with police and with staff. Hard restraint applied which pt promptly began trying to escape from - pt medicated as ordered.

## 2015-03-07 ENCOUNTER — Inpatient Hospital Stay
Admit: 2015-03-07 | Discharge: 2015-03-14 | DRG: 885 | Disposition: A | Discharge status: Home / Self Care | Payer: BLUE CROSS/BLUE SHIELD | Source: Intra-hospital | Attending: Psychiatry | Admitting: Psychiatry

## 2015-03-07 DIAGNOSIS — F17290 Nicotine dependence, other tobacco product, uncomplicated: Secondary | ICD-10-CM | POA: Diagnosis present

## 2015-03-07 DIAGNOSIS — F332 Major depressive disorder, recurrent severe without psychotic features: Principal | ICD-10-CM | POA: Diagnosis present

## 2015-03-07 DIAGNOSIS — Z8249 Family history of ischemic heart disease and other diseases of the circulatory system: Secondary | ICD-10-CM | POA: Diagnosis not present

## 2015-03-07 DIAGNOSIS — R45850 Homicidal ideations: Secondary | ICD-10-CM | POA: Diagnosis present

## 2015-03-07 DIAGNOSIS — R4587 Impulsiveness: Secondary | ICD-10-CM | POA: Diagnosis present

## 2015-03-07 DIAGNOSIS — E663 Overweight: Secondary | ICD-10-CM | POA: Diagnosis present

## 2015-03-07 DIAGNOSIS — G4700 Insomnia, unspecified: Secondary | ICD-10-CM | POA: Diagnosis present

## 2015-03-07 DIAGNOSIS — F29 Unspecified psychosis not due to a substance or known physiological condition: Secondary | ICD-10-CM | POA: Diagnosis not present

## 2015-03-07 DIAGNOSIS — F419 Anxiety disorder, unspecified: Secondary | ICD-10-CM | POA: Diagnosis present

## 2015-03-07 DIAGNOSIS — K219 Gastro-esophageal reflux disease without esophagitis: Secondary | ICD-10-CM | POA: Diagnosis present

## 2015-03-07 DIAGNOSIS — Z833 Family history of diabetes mellitus: Secondary | ICD-10-CM | POA: Diagnosis not present

## 2015-03-07 LAB — SALICYLATE LEVEL: Salicylate Lvl: <4 mg/dL (ref 2.8–30.0)

## 2015-03-07 LAB — CBC WITH DIFFERENTIAL/PLATELET
BASOS PCT: 1 %
Basophils Absolute: 0.1 10*3/uL (ref 0–0.1)
EOS ABS: 0.1 10*3/uL (ref 0–0.7)
Eosinophils Relative: 0 %
HEMATOCRIT: 44.3 % (ref 40.0–52.0)
HEMOGLOBIN: 15.3 g/dL (ref 13.0–18.0)
LYMPHS ABS: 2.2 10*3/uL (ref 1.0–3.6)
Lymphocytes Relative: 14 %
MCH: 28 pg (ref 26.0–34.0)
MCHC: 34.5 g/dL (ref 32.0–36.0)
MCV: 81.4 fL (ref 80.0–100.0)
MONOS PCT: 7 %
Monocytes Absolute: 1.2 10*3/uL — ABNORMAL HIGH (ref 0.2–1.0)
NEUTROS ABS: 12.5 10*3/uL — AB (ref 1.4–6.5)
NEUTROS PCT: 78 %
Platelets: 198 10*3/uL (ref 150–440)
RBC: 5.44 MIL/uL (ref 4.40–5.90)
RDW: 13.3 % (ref 11.5–14.5)
WBC: 16 10*3/uL — AB (ref 3.8–10.6)

## 2015-03-07 LAB — COMPREHENSIVE METABOLIC PANEL
ALBUMIN: 4.1 g/dL (ref 3.5–5.0)
ALT: 47 U/L (ref 17–63)
AST: 22 U/L (ref 15–41)
Alkaline Phosphatase: 77 U/L (ref 38–126)
Anion gap: 6 (ref 5–15)
BUN: 14 mg/dL (ref 6–20)
CALCIUM: 9.5 mg/dL (ref 8.9–10.3)
CHLORIDE: 109 mmol/L (ref 101–111)
CO2: 26 mmol/L (ref 22–32)
CREATININE: 1.18 mg/dL (ref 0.61–1.24)
GFR calc Af Amer: >60 mL/min (ref >60)
GLUCOSE: 93 mg/dL (ref 65–99)
POTASSIUM: 3.4 mmol/L — AB (ref 3.5–5.1)
SODIUM: 141 mmol/L (ref 135–145)
TOTAL PROTEIN: 7.3 g/dL (ref 6.5–8.1)
Total Bilirubin: 1 mg/dL (ref 0.3–1.2)

## 2015-03-07 LAB — TSH: TSH: 3.913 u[IU]/mL (ref 0.350–4.500)

## 2015-03-07 LAB — ETHANOL

## 2015-03-07 LAB — ACETAMINOPHEN LEVEL

## 2015-03-07 MED ORDER — FLUOXETINE HCL 20 MG PO CAPS
20.0000 mg | ORAL_CAPSULE | Freq: Every day | ORAL | Status: DC
  Administered 2015-03-07: 20 mg via ORAL
  Filled 2015-03-07: qty 1

## 2015-03-07 MED ORDER — TRAZODONE HCL 100 MG PO TABS: 150.0000 mg | ORAL_TABLET | Freq: Every day | ORAL | Status: DC

## 2015-03-07 MED ORDER — FLUOXETINE HCL 20 MG PO CAPS
20.0000 mg | ORAL_CAPSULE | Freq: Every day | ORAL | Status: DC
  Administered 2015-03-08 – 2015-03-14 (×7): 20 mg via ORAL
  Filled 2015-03-07 (×8): qty 1

## 2015-03-07 MED ORDER — MAGNESIUM HYDROXIDE 400 MG/5ML PO SUSP: 30.0000 mL | Freq: Every day | ORAL | Status: DC | PRN

## 2015-03-07 MED ORDER — ALUM & MAG HYDROXIDE-SIMETH 200-200-20 MG/5ML PO SUSP: 30.0000 mL | ORAL | Status: DC | PRN

## 2015-03-07 MED ORDER — TRAZODONE HCL 50 MG PO TABS
150.0000 mg | ORAL_TABLET | Freq: Every day | ORAL | Status: DC
  Administered 2015-03-07: 150 mg via ORAL
  Filled 2015-03-07: qty 1

## 2015-03-07 MED ORDER — ACETAMINOPHEN 325 MG PO TABS: 650.0000 mg | ORAL_TABLET | Freq: Four times a day (QID) | ORAL | Status: DC | PRN

## 2015-03-07 MED ORDER — LORAZEPAM 0.5 MG PO TABS: 0.5000 mg | ORAL_TABLET | Freq: Once | ORAL | Status: DC

## 2015-03-07 NOTE — ED Notes (Addendum)
Wrong pt

## 2015-03-07 NOTE — ED Notes (Signed)
Tech at bedside for blood draw

## 2015-03-07 NOTE — ED Notes (Signed)
Pt sleeping soundly; resp even and unlabored; sitter at bedside released
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
## 2015-03-07 NOTE — BH Assessment (Signed)
[facility] by Dr. Toni Amendlapacs.  Attending Physician will be Dr. Jennet MaduroPucilowska.   Patient has [facility], [facility].   Intake Paper Work has been signed and placed on patient chart.  ER staff is aware of the admission Misty Stanley(Lisa, ER Sect.; Dr. Mayford KnifeWilliams, ER MD; Nellie Patient's Nurse & Dennie BiblePat, Patient Access).

## 2015-03-08 DIAGNOSIS — F332 Major depressive disorder, recurrent severe without psychotic features: Principal | ICD-10-CM

## 2015-03-08 MED ORDER — DIVALPROEX SODIUM ER 500 MG PO TB24
1000.0000 mg | ORAL_TABLET | Freq: Every day | ORAL | Status: DC
  Administered 2015-03-08 – 2015-03-14 (×7): 1000 mg via ORAL
  Filled 2015-03-08 (×8): qty 2

## 2015-03-08 MED ORDER — LORAZEPAM 0.5 MG PO TABS
0.5000 mg | ORAL_TABLET | Freq: Every day | ORAL | Status: DC
  Administered 2015-03-08 – 2015-03-12 (×5): 0.5 mg via ORAL
  Filled 2015-03-08 (×5): qty 1

## 2015-03-08 MED ORDER — DIPHENHYDRAMINE HCL 50 MG/ML IJ SOLN
50.0000 mg | Freq: Once | INTRAMUSCULAR | Status: AC
  Administered 2015-03-08: 50 mg via INTRAMUSCULAR
  Filled 2015-03-08: qty 1

## 2015-03-08 MED ORDER — DIPHENHYDRAMINE HCL 25 MG PO CAPS
50.0000 mg | ORAL_CAPSULE | Freq: Every day | ORAL | Status: DC
  Administered 2015-03-09: 50 mg via ORAL
  Filled 2015-03-08 (×2): qty 2

## 2015-03-08 MED ORDER — DIPHENHYDRAMINE HCL 50 MG/ML IJ SOLN
50.0000 mg | Freq: Three times a day (TID) | INTRAMUSCULAR | Status: DC | PRN
  Administered 2015-03-08 (×2): 50 mg via INTRAVENOUS
  Filled 2015-03-08 (×2): qty 1
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
## 2015-03-08 NOTE — Progress Notes (Signed)
Admission Assessment: 24 yo WM admitted from [facility] Ed for Depression, HI towards ex-fiance, BIB and IVC by PD, "My fiance keeps on breaking up the engagement, I had it with her .Marland Kitchen..." Place on S&R while in ED, was recently discharged from this unit last Monday. Patient searched by 2 nursing staffs on admission for contrabands to ensure a safe and therapeutic milieu for all, no paraphilia or any other contrabands found on patient or in the belongings. Skin Assessment completed and documented, gross skin intact; no tattoos, no piercing.

## 2015-03-08 NOTE — B[facility] Group Notes (Signed)
[facility] LCSW Group Therapy   03/08/2015 1:15 PM   Type of Therapy: Group Therapy   Participation Level: Did Not Attend. Patient invited to participate but declined.    Allyn Bartelson F. Arturo Sofranko, MSW, LCSWA, LCAS   

## 2015-03-08 NOTE — Progress Notes (Signed)
D:  Pt has had 2 dystonic episodes, MD was notified and new order given for Benadryl 50mg  IM injection q8 hours prn, pt has a depressed/flat affect, denies SI/HI/AVH.     A:  Emotional support provided, Encouraged pt to continue with treatment plan and attend all group activities, q15 min checks maintained for safety.  R:  Pt has not been able to go to all the groups and activities today due to his dystonic reactions, pt is receptive and cooperative, pleasant with staff and other patients on the unit.

## 2015-03-08 NOTE — Plan of Care (Signed)
Problem: Ineffective individual coping Goal: LTG: Patient will report a decrease in negative feelings Outcome: Progressing Patient denies SI/HI.      
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
## 2015-03-08 NOTE — BHH Suicide Risk Assessment (Signed)
[facility] INPATIENT:  Family/Significant Other Suicide Prevention Education  Suicide Prevention Education:  Patient Refusal for Family/Significant Other Suicide Prevention Education: The patient Chase Hampton has refused to provide written consent for family/significant other to be provided Family/Significant Other Suicide Prevention Education during admission and/or prior to discharge.  Physician notified.  CSW completed with pt.  Dorothe PeaJonathan F Berkleigh Beckles LCSWA, LCAS   03/08/2015, 10:16 AM

## 2015-03-08 NOTE — Progress Notes (Signed)
D: Pt denies SI/HI/AVH. Pt is pleasant and cooperative, affect flat but brightens upon approach, Patient had a dystonic episode at night time he was medicated with 50 mg benadryl IM with great effect, he appears less anxious and he is interacting with peers and staff appropriately.  A: Pt was offered support and encouragement. Pt was given scheduled medications. Pt was encouraged to attend groups. Q 15 minute checks were done for safety.  R:Pt attends groups and interacts well with peers and staff. Pt is taking medication. Pt has no complaints.Pt receptive to treatment and safety maintained on unit.

## 2015-03-08 NOTE — [facility] Suicide Risk Assessment (Signed)
[facility]   Nursing information obtained from:    Demographic factors:    Current Mental Status:    Loss Factors:    Historical Factors:    Risk Reduction Factors:    Total Time spent with patient: 1 hour Principal Problem: Severe recurrent major depression without psychotic features [facility](HCC) Diagnosis:   Patient Active Problem List   Diagnosis Date Noted  . Severe recurrent major depression without psychotic features (HCC) [F33.2] 02/23/2015     Continued Clinical Symptoms:  Alcohol Use Disorder Identification Test Final Score (AUDIT): 0 The "Alcohol Use Disorders Identification Test", Guidelines for Use in Primary Care, Second Edition.  World Science writerHealth Organization [facility](WHO). Score between 0-7:  no or low risk or alcohol related problems. Score between 8-15:  moderate risk of alcohol related problems. Score between 16-19:  high risk of alcohol related problems. Score 20 or above:  warrants further diagnostic evaluation for alcohol dependence and treatment.   CLINICAL FACTORS:   Severe Anxiety and/or Agitation Depression:   Aggression Impulsivity Severe Previous Psychiatric Diagnoses and Treatments    Psychiatric Specialty Exam: Physical Exam  ROS    COGNITIVE FEATURES THAT CONTRIBUTE TO RISK:  None    SUICIDE RISK:   Moderate:  Frequent suicidal ideation with limited intensity, and duration, some specificity in terms of plans, no associated intent, good self-control, limited dysphoria/symptomatology, some risk factors present, and identifiable protective factors, including available and accessible social support.  PL[facility] OF CARE[facility]  Medical Decision Making:  Established Problem, Worsening (2)  I certify that inpatient services furnished can reasonably be expected to improve the patient's condition.   Jimmy FootmanHernandez-Gonzalez,  Jakota Manthei 03/08/2015, 2:17 PM

## 2015-03-08 NOTE — Progress Notes (Signed)
Recreation Therapy Notes  Date: 12.13.16 Time: 3:00 pm Location: Craft Room  Group Topic: Self-expression  Goal Area(s) Addresses:  Patient will effectively use art as a means of self-expression. Patient will recognize positive benefit of self-expression. Patient will be able to identify one emotion experienced during group session. Patient will identify use of art/self-expression as a coping skill.  Behavioral Response: Did not attend  Intervention: Two Faces of Me  Activity: Patients were given a blank face worksheet and instructed to draw a line down the middle. On one side, patients were instructed to draw or write how they felt when they were admitted to the hospital and on the other side they were instructed to draw or write how they want to feel when they are d/c.  Education: LRT educated patient on different forms of self-expression.  Education Outcome: Patient did not attend group.   Clinical Observations/Feedback: Patient did not attend group.  Jacquelynn CreeGreene,Raymondo Garcialopez M, LRT/CTRS 03/08/2015 4:29 PM

## 2015-03-08 NOTE — Plan of Care (Signed)
Problem: Aggression Towards others,Towards Self, and or Destruction Goal: LTG - No aggression,physical/verbal/destruction prior to D/C (Patient will have no episodes of physical or verbal aggression or property destruction towards self or others for _____ day (s) prior to discharge.)  Outcome: Progressing Pt shows no signs of aggression today on day shift.

## 2015-03-08 NOTE — Progress Notes (Signed)
Pleasant on admission and adjusted well immediately to the unit; Trazodone 150 mg given as scheduled; requested for Ativan, "The Dr. Nathaneil CanaryJust added ativan to my medications, it helps me to sleep better. Can I request the care of [name], I got along well the last time I was here .Marland Kitchen.Marland Kitchen."

## 2015-03-08 NOTE — Tx Team (Signed)
Initial Interdisciplinary Treatment Plan   PATIENT STRESSORS: Loss of (Anticipated Loss of Fiance) Marital or family conflict   PATIENT STRENGTHS: Ability for insight Average or above average intelligence Capable of independent living   PROBLEM LIST: Problem List/Patient Goals Date to be addressed Date deferred Reason deferred Estimated date of resolution  Mood Liability 03/07/2015     Depressive Symptoms 03/07/2015     Homicidal Ideations 03/07/2015     Anger and Aggression 03/07/2015                                    DISCHARGE CRITERIA:  Improved stabilization in mood, thinking, and/or behavior Motivation to continue treatment in a less acute level of care Reduction of life-threatening or endangering symptoms to within safe limits  PRELIMINARY DISCHARGE PLAN: Outpatient therapy Return to previous living arrangement  PATIENT/FAMIILY INVOLVEMENT: This treatment plan has been presented to and reviewed with the patient, Robyne PeersStewart [employer] Jiles.  The patient and family have been given the opportunity to ask questions and make suggestions.  Kidada Ging Mosie Lukes Eliah Marquard 03/08/2015, 4:39 AM

## 2015-03-08 NOTE — Progress Notes (Signed)
Recreation Therapy Notes  INPATIENT RECREATION THERAPY ASSESSMENT  Patient Details Name: Chase Hampton MRN: [id number] DOB: 05/23/1990 Today's Date: 03/08/2015  Patient Stressors: Family, Relationship, Work ("My parents are pestering me every 10 minutes"; talked to ex-fiance and got upset)  Coping Skills:   Isolate, Arguments, Avoidance, Exercise, Music, Sports, Other (Comment) (Working, go for a drive, deep breathing, mindfulness, play with dog)  Personal Challenges: Anger, Communication, Concentration, Expressing Yourself, Relationships, Self-Esteem/Confidence, Social Interaction, Stress Management, Time Management, Trusting Others  Leisure Interests (2+):  Individual - Other (Comment) (Go for a drive, play with dog)  Awareness of Community Resources:  Yes  Community Resources:  Park, Other (Comment) (Track)  Current Use: Yes  If no, Barriers?:    Patient Strengths:  Smart, caring  Patient Identified Areas of Improvement:  Anger management  Current Recreation Participation:  Watch TV, play with dog  Patient Goal for Hospitalization:  To get out  [place]  County [place] Residence:  [place]   Current SI (including self-harm):  No  Current HI:  No  Consent to Intern Participation: N/A   Jacquelynn CreeGreene,Krista Godsil M, LRT/CTRS 03/08/2015, 1:23 PM
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
## 2015-03-08 NOTE — [facility] Group Notes (Signed)
[facility] Group Notes:  (Nursing/MHT/Case Management/Adjunct)  Date:  03/08/2015  Time:  2:21 PM  Type of Therapy:  Psychoeducational Skills  Participation Level:  Did Not Attend    Arelia LongestLinda F Woodward Klem 03/08/2015, 2:21 PM

## 2015-03-09 NOTE — Plan of Care (Signed)
Problem: [facility] Participation in Recreation Therapeutic Interventions Goal: STG-Patient will identify at least five coping skills for ** STG: Coping Skills - Within 4 treatment sessions, patient will verbalize at least 5 new coping skills in each of 2 treatment sessions to increase healthy coping skills post d/c.  Outcome: Progressing Treatment Session 1; Completed 1 out of 2: At approximately 12:05 pm, LRT met with patient in consultation room. Patient verbalized 5 coping skills. LRT encouraged patient to continue thinking of coping skills.  Intervention Used: Coping Skills worksheet  Leonette Monarch, LRT/CTRS 12.14.16 12:19 pm Goal: STG-Other Recreation Therapy Goal (Specify) STG: Time Management - Within 4 treatment sessions, patient will verbalize understanding of scheduling in each of 2 treatment sessions to increase time management skills post d/c.  Outcome: Progressing Treatment Session 1; Completed 1 out of 2: At approximately 12:05 pm, LRT met with patient in consultation room. LRT educated and provided patient with blank schedules. Patient verbalized understanding. LRT encouraged patient to use schedules to help him manage his time. Intervention Used: [place], LRT/CTRS 12.14.16 12:20 pm
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
## 2015-03-09 NOTE — Progress Notes (Signed)
Recreation Therapy Notes  Date: 12.14.16 Time: 3:00 pm Location: Craft Room  Group Topic: Self-esteem  Goal Area(s) Addresses:   Patient will write at least one positive trait about self. Patient will verbalize benefit of having a healthy self-esteem.  Behavioral Response: Attentive  Intervention: I Am  Activity: Patients were given a worksheet with the letter I on it and instructed to fill it with as many positive traits about themselves as they could.  Education: LRT educated patients on ways they can increase their self-esteem.  Education Outcome: Acknowledges education/In group clarification offered  Clinical Observations/Feedback: Patient completed activity by writing positive traits down. Patient did not contribute to group discussion.  Jacquelynn CreeGreene,Charron Coultas M, LRT/CTRS 03/09/2015 4:25 PM

## 2015-03-09 NOTE — Progress Notes (Signed)
D- Patient in bed resting upon approach.   Patient denies SI/ HI/AVH and pain. Contracts for safety during inpatient stay. Patient rates depression a 5/ 10 and endorses anxiety rated a 4/10. Patient states that his depression and anxiety levels have decreased since he has been participating in care. Patient attends scheduled groups and is seen interacting with peers.   A- Nurse provided reassurance and helped maintain a safe environment. Every 15 minute checks completed for safety. Provided scheduled medications.  R-  Patient compliant with treatment and is visible in the milieu. Safety maintained.

## 2015-03-09 NOTE — Plan of Care (Signed)
Problem: Ineffective individual coping Goal: LTG: Patient will report a decrease in negative feelings Outcome: Progressing Patient reports decreased anxiety and depression.

## 2015-03-09 NOTE — [facility] Group Notes (Signed)
[facility]   03/09/2015 9:15 AM  ?  Participation Quality: Alert, Appropriate and Oriented   Mood/Affect: Appropriate  Depression Rating:  6  Anxiety Rating: 5  Thoughts of Suicide: Pt denies SI/HI   Will you contract for safety? Yes   Current AVH: Pt denies   Plan for Discharge/Comments: Pt attended discharge planning group and actively participated in group. CSW provided pt with today's workbook. Pt was polite and cooperative with the CSW and other group members and focused and attentive to the topics discussed and the sharing of others.   Transportation Means: Pt reports access to transportation   Supports: No supports mentioned at this time     Dorothe PeaJonathan F. Hazaiah [place], MSW, LCSWA, LCAS

## 2015-03-09 NOTE — B[facility] Group Notes (Signed)
[facility] Group Notes:  (Nursing/MHT/Case Management/Adjunct)  Date:  03/09/2015  Time:  12:35 PM  Type of Therapy:  Psychoeducational Skills  Participation Level:  Did Not Attend   Lynelle SmokeCara Travis [facility] 03/09/2015, 12:35 PM

## 2015-03-10 MED ORDER — DIPHENHYDRAMINE HCL 25 MG PO CAPS
50.0000 mg | ORAL_CAPSULE | Freq: Three times a day (TID) | ORAL | Status: DC | PRN
  Administered 2015-03-10 – 2015-03-12 (×2): 50 mg via ORAL
  Filled 2015-03-10 (×2): qty 2

## 2015-03-10 NOTE — Progress Notes (Signed)
D: Patient appears brighter. He stated he had a better day than previously. He denies SI/HI/AVH. He also denies pain.  A: Medication was given with education. Encouragement provided.  R: Patient was compliant with medication. He has been calm and cooperative. He was seen reading a book that his parent gave him. No complaints.

## 2015-03-10 NOTE — [facility] Group Notes (Signed)
[facility] Group Notes:  (Nursing/MHT/Case Management/Adjunct)  Date:  03/10/2015  Time:  2:19 PM  Type of Therapy:  Group Therapy  Participation Level:  Active  Participation Quality:  Appropriate  Affect:  Appropriate  Cognitive:  Alert, Appropriate and Oriented  Insight:  Appropriate  Engagement in Group:  Engaged  Modes of Intervention:  Discussion  Summary of Progress/Problems:  Chase Hampton 03/10/2015, 2:19 PM

## 2015-03-10 NOTE — B[facility] Group Notes (Signed)
[facility] LCSW Group Therapy   03/10/2015 11:00 am   Type of Therapy: Group Therapy   Participation Level: Active   Participation Quality: Attentive, Sharing and Supportive   Affect: Appropriate   Cognitive: Alert and Oriented   Insight: Developing/Improving and Engaged   Engagement in Therapy: Developing/Improving and Engaged   Modes of Intervention: Clarification, Confrontation, Discussion, Education, Exploration, Limit-setting, Orientation, Problem-solving, Rapport Building, Dance movement psychotherapisteality Testing, Socialization and Support   Summary of Progress/Problems: The topic for group was balance in life. Today's group focused on defining balance in one's own words, identifying things that can knock one off balance, and exploring healthy ways to maintain balance in life. Group members were asked to provide an example of a time when they felt off balance, describe how they handled that situation, and process healthier ways to regain balance in the future. Group members were asked to share the most important tool for maintaining balance that they [facility] and how they plan to apply this method after discharge. Pt presented as pleasant and calm and at times displayed a sense of humor and at times was smiling and laughing with others during group  Pt shared his situation would get better if he were to stabilize using therapy and medication management and how the pt would, in the future, balance work, going to movies and seeking out anger management groups which would enable the pt to effectively cope with lonelness.

## 2015-03-10 NOTE — Plan of Care (Signed)
Problem: [facility] Participation in Recreation Therapeutic Interventions Goal: STG-Patient will identify at least five coping skills for ** STG: Coping Skills - Within 4 treatment sessions, patient will verbalize at least 5 new coping skills in each of 2 treatment sessions to increase healthy coping skills post d/c.  Outcome: Completed/Met Date Met:  03/10/15 Treatment Session 2; Completed 2 out of 2: At approximately 10:15 am, LRT met with patient in craft room. Patient verbalized about 35 new coping skills. LRT encouraged patient to continue thinking about coping skills and to write them down as he thought of them. Intervention Used: Coping Skills worksheet   M , LRT/CTRS 12.15.16 1:36 pm Goal: STG-Other Recreation Therapy Goal (Specify) STG: Time Management - Within 4 treatment sessions, patient will verbalize understanding of scheduling in each of 2 treatment sessions to increase time management skills post d/c.  Outcome: Completed/Met Date Met:  03/10/15 Treatment Session 2; Completed 2 out of 2: At approximately 10:15 am, LRT met with patient in craft room. Patient verbalized understanding of the schedules. LRT encouraged patient to use the schedules to help him manage his time. Intervention Used: Schedules   M , LRT/CTRS 12.15.16 1:37 pm     

## 2015-03-10 NOTE — Plan of Care (Signed)
Problem: Aggression Towards others,Towards Self, and or Destruction Goal: STG-Patient will comply with prescribed medication regimen (Patient will comply with prescribed medication regimen)  Outcome: Progressing Patient has been compliant with medication during this shift.

## 2015-03-10 NOTE — [facility] Group Notes (Signed)
[facility] LCSW Group Therapy   03/09/2015 1:15 PM   Type of Therapy: Group Therapy   Participation Level: Did Not Attend. Patient invited to participate but declined.    Sequoia Mincey F. Dona Klemann, MSW, LCSWA, LCAS   

## 2015-03-10 NOTE — [facility] Group Notes (Signed)
[facility] Group Notes:  (Nursing/MHT/Case Management/Adjunct)  Date:  03/10/2015  Time:  1:07 AM  Type of Therapy:  Group Therapy  Participation Level:  Active  Participation Quality:  Appropriate  Affect:  Appropriate  Cognitive:  Appropriate  Insight:  Appropriate  Engagement in Group:  Engaged  Modes of Intervention:  n/a  Summary of Progress/Problems:  Chase Hampton 03/10/2015, 1:07 AM

## 2015-03-10 NOTE — Progress Notes (Signed)
Recreation Therapy Notes  LRT did not hold group as there were not appropriate group rooms available.  Tashanda Fuhrer M, LRT/CTRS 03/10/2015 4:48 PM 
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
## 2015-03-10 NOTE — Progress Notes (Signed)
D:  Per pt self inventory pt reports sleeping good, appetite fair, energy level low, ability to pay attention poor, rates depression at a 5 out of 10, hopelessness at a 5 out of 10, anxiety at a 4 out of 10, goal: "getting adjusted to meds, try to stay out of bed"     A:  Emotional support provided, Encouraged pt to continue with treatment plan and attend all group activities, q15 min checks maintained for safety.  R:  Pt is receptive going to group, pleasant and cooperative with staff and other patients.

## 2015-03-11 NOTE — Progress Notes (Signed)
Recreation Therapy Notes  Date: 12.16.16 Time: 3:15 pm Location: Craft Room  Group Topic: Coping Skills  Goal Area(s) Addresses:  Patient will participate in healthy coping skill. Patient will verbalize benefit of using art as a coping skill.  Behavioral Response: Attentive  Intervention: Coloring  Activity: Patients were given coloring sheets and instructed to color and focus on what emotions they were feeling and what they were focused on.   Education: LRT educated patients on healthy coping skills.  Education Outcome: Acknowledges education/In group clarification offered  Clinical Observations/Feedback: Patient colored coloring sheet. Patient did not contribute to group discussion.  Jacquelynn CreeGreene,Ronell Boldin M, LRT/CTRS 03/11/2015 4:31 PM
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
## 2015-03-11 NOTE — [facility] Group Notes (Signed)
[facility] Group Notes:  (Nursing/MHT/Case Management/Adjunct)  Date:  03/11/2015  Time:  4:07 AM  Type of Therapy:  Psychoeducational Skills  Participation Level:  Minimal  Participation Quality:  Appropriate and Attentive  Affect:  Appropriate  Cognitive:  Appropriate and Oriented  Insight:  Good  Engagement in Group:  Improving and Lacking  Modes of Intervention:  Discussion  Summary of Progress/Problems:  Rameen Gohlke R Stefhanie Kachmar 03/11/2015, 4:07 AM

## 2015-03-11 NOTE — [facility] Group Notes (Signed)
[facility] Group Notes:  (Nursing/MHT/Case Management/Adjunct)  Date:  03/11/2015  Time:  11:47 AM  Type of Therapy:  Group Therapy  Participation Level:  Active  Participation Quality:  Appropriate and Attentive  Affect:  Appropriate  Cognitive:  Alert, Appropriate and Oriented  Insight:  Appropriate  Engagement in Group:  Engaged  Modes of Intervention:  Activity  Summary of Progress/Problems:  Chase Hampton Chase Hampton 03/11/2015, 11:47 AM

## 2015-03-11 NOTE — Progress Notes (Signed)
D- Patient had visitors. Interacted appropriately. Patient denies SI/ HI/AVH and pain. Contracts for safety.  is overall feeling better and looking forward to discharge next week. A- Nurse provided encouragement and helped maintain a safe environment. Q15 minute checks completed for safety. Provided scheduled medications.  R- Patient compliant with treatment and is visible in the milieu . Safety maintained. No complaints or concerns.

## 2015-03-11 NOTE — [facility] Group Notes (Signed)
[facility] LCSW Group Therapy   03/11/2015 1:15 PM   Type of Therapy: Group Therapy   Participation Level: Active   Participation Quality: Attentive, Sharing and Supportive   Affect: Depressed and Flat   Cognitive: Alert and Oriented   Insight: Developing/Improving and Engaged   Engagement in Therapy: Developing/Improving and Engaged   Modes of Intervention: Clarification, Confrontation, Discussion, Education, Exploration, Limit-setting, Orientation, Problem-solving, Rapport Building, Dance movement psychotherapisteality Testing, Socialization and Support   Summary of Progress/Problems: The topic for today was feelings about relapse. Pt discussed what relapse prevention is to them and identified triggers that they are on the path to relapse. Pt processed their feeling towards relapse and was able to relate to peers. Pt discussed coping skills that can be used for relapse prevention.  Pt described relapse as going backwards and having felt symptoms of depression, as a result.  Pt shared with the group that he has used self-reflection as a coping mechanism and reconstructing how he became depressed and there have been good results, as a result.         Dorothe PeaJonathan F. Salisa Broz, MSW, LCSWA, LCAS
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
## 2015-03-11 NOTE — Plan of Care (Signed)
Problem: Ineffective individual coping Goal: LTG: Patient will report a decrease in negative feelings Outcome: Progressing Patient does not have SI or HI but still endorses depression.

## 2015-03-11 NOTE — [facility] Group Notes (Signed)
[facility] LCSW Aftercare Discharge Planning Group Note  03/11/2015 9:15 AM  Participation Quality: Did Not Attend. Patient invited to participate but declined.   Chase Hampton F. Keylah Darwish, MSW, LCSWA, LCAS   

## 2015-03-11 NOTE — Plan of Care (Signed)
Problem: Ineffective individual coping Goal: LTG: Patient will report a decrease in negative feelings Outcome: Progressing Patient reports feeling he is progressing.
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
## 2015-03-11 NOTE — Care Management (Signed)
Several attempts made to obtain authorization for this admission.Seems to be a lapse in coverage. Advised Mr. And Chase Hampton, who state they will call [employer] to have information updated.

## 2015-03-11 NOTE — Progress Notes (Signed)
D- Patient in bed resting upon approach.  Patient denies SI/ HI/AVH and pain. Contracts for safety during inpatient stay. Patient rates depression a  6 / 10 and endorses anxiety of a 2/10.States that he is overall feeling better but still is struggling with depression. However he wishes to work on acceptance and how to change his thinking.  A- Nurse provided reassurance and helped maintain a safe environment. Every 15 minute checks completed for safety. Provided scheduled medications.  R-  Patient compliant with treatment and is visible in the milieu. Safety maintained.

## 2015-03-12 NOTE — Progress Notes (Signed)
Pt has been seclusive to his room most of the day. Pt's mood and affect has been depressed. Pt not attending unit activities. Pt endorses fleeting thoughts of suicide but denies having a plan. Pt denies A/V hallucinations. Pt encouraged to participate in unit activities.

## 2015-03-12 NOTE — [facility] Group Notes (Addendum)
[facility] LCSW Group Therapy  03/12/2015 4:24 PM  Type of Therapy:  Group Therapy  Participation Level:  Minimal  Participation Quality:  Attentive  Affect:  Depressed   Cognitive:  Alert  Insight:  Limited  Engagement in Therapy:  Limited  Modes of Intervention:  Discussion, Education, Socialization and Support  Summary of Progress/Problems: Balance in life: Patients will discuss the concept of balance and how it looks and feels to be unbalanced. Pt will identify areas in their life that is unbalanced and ways to become more balanced.  Pt attended group and stayed the entire time. He sat quietly and listened to other group members share. He appeared to be frustrated. When asked how he was doing, he states he is going to talk to his lawyer.   Sempra EnergyCandace L Damira Kem MSW, LCSWA  03/12/2015, 4:24 PM
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
## 2015-03-13 MED ORDER — LORAZEPAM 2 MG PO TABS
2.0000 mg | ORAL_TABLET | Freq: Four times a day (QID) | ORAL | Status: DC | PRN
  Administered 2015-03-13: 2 mg via ORAL
  Filled 2015-03-13: qty 1

## 2015-03-13 NOTE — Progress Notes (Signed)
Pt has been seclusive to his room most of the day. Pt's mood and affect has been depressed. Pt not attending unit activities. Pt endorses fleeting thoughts of suicide but denies having a plan. Pt denies A/V hallucinations. Pt encouraged to participate in unit activities but said, no.Pt stated he was tried  And needed to sleep. .Marland Kitchen

## 2015-03-13 NOTE — Plan of Care (Signed)
Problem: Alteration in mood Goal: LTG-Pt's behavior demonstrates decreased signs of depression (Patient's behavior demonstrates decreased signs of depression to the point the patient is safe to return home and continue treatment in an outpatient setting)  Outcome: Progressing Pt is pleasant and interacting with staff appropriately.
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
## 2015-03-13 NOTE — Progress Notes (Signed)
D: Pt is awake and active in the milieu this evening. Pt mood is depressed and his affect is sad. Pt denies SI/HI and AVH at this time. Pt is pleasant and cooperative with staff but is somewhat isolative and forwards little to staff.   A: Writer provided emotional support and administered medications as prescribed. Prn medications were effective for sleep.  R: Pt spent most of the evening in his room and he went to bed following medication administration.

## 2015-03-13 NOTE — [facility] Group Notes (Signed)
[facility] LCSW Group Therapy  03/13/2015 4:09 PM  Type of Therapy:  Group Therapy  Participation Level:  Minimal  Participation Quality:  Attentive  Affect:  Irritable  Cognitive:  Alert  Insight:  Limited  Engagement in Therapy:  Limited  Modes of Intervention:  Discussion, Education, Socialization and Support  Summary of Progress/Problems: Pt will identify unhealthy thoughts and how they impact their emotions and behavior. Pt will be encouraged to discuss these thoughts, emotions and behaviors with the group. Chase Hampton attended group but did not stay the entire time. He expressed frustration about being involuntarily committed and how he will be talking to his lawyer on Monday.   Sempra EnergyCandace L Darlena Koval MSW, LCSWA  03/13/2015, 4:09 PM

## 2015-03-14 ENCOUNTER — Telehealth: Payer: Self-pay | Admitting: *Deleted

## 2015-03-14 LAB — VALPROIC ACID LEVEL: Valproic Acid Lvl: 73 ug/mL (ref 50.0–100.0)

## 2015-03-14 MED ORDER — DIVALPROEX SODIUM ER 500 MG PO TB24: 1000.0000 mg | ORAL_TABLET | Freq: Every day | ORAL | Status: DC

## 2015-03-14 MED ORDER — LORAZEPAM 0.5 MG PO TABS: 0.5000 mg | ORAL_TABLET | Freq: Every evening | ORAL | Status: DC | PRN

## 2015-03-14 NOTE — [facility] Group Notes (Signed)
[facility] Group Notes:  (Nursing/MHT/Case Management/Adjunct)  Date:  03/14/2015  Time:  1:49 PM  Type of Therapy:  Group Therapy  Participation Level:  Active    Participation Quality:  Appropriate  Affect:  Appropriate  Cognitive:  Appropriate  Insight:  Appropriate  Engagement in Group:  Engaged  Modes of Intervention:  Discussion and Support  Summary of Progress/Problems:  Chase Hampton 03/14/2015, 1:49 PM

## 2015-03-14 NOTE — Telephone Encounter (Signed)
Excellent-jh

## 2015-03-14 NOTE — Telephone Encounter (Signed)
Called spoke to [place]llis in regards to moving up neuro appt. She will send a message back to Dr. Sherryll BurgerShah to see if patient can be seen earlier. They will give us a call.

## 2015-03-14 NOTE — Progress Notes (Signed)
D- Patient in bed resting upon approach.  Patient denies SI/ HI/AVH and pain. Patient states that he no longer has homicidal ideation because he needed the time way to "gather his thoughts" and now he feels safe to return home. Patient states that he became agitated the night prior because he felt ready to be discharged. Patient has no complaints and was compliant with scheduled medications. A- Nurse provided reassurance and helped maintain a safe environment. Every 15 minute checks completed for safety. Provided scheduled medications.  R-  Patient compliant with treatment and is visible in the milieu. Safety maintained.

## 2015-03-14 NOTE — Telephone Encounter (Signed)
Appt 03/15/2015 @  10:15 Patient's mother aware.
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
## 2015-03-14 NOTE — Progress Notes (Signed)
Patient ID: Chase Hampton R Sensabaugh, male   DOB: 04/20/1990, 24 y.o.   MRN: [id number]  Patient denies SI/HI/AVH and pain. Patient discharged in custody of parents. Patient belongings (1 sweatshirt) returned to patient. Discharge instructions reviewed.
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
## 2015-03-14 NOTE — Telephone Encounter (Signed)
Thanks.  Let me know results.-jh

## 2015-03-14 NOTE — Telephone Encounter (Signed)
Patient's mother aware of appt change to [facility] 03/15/2015.

## 2015-03-14 NOTE — Progress Notes (Signed)
Nurse spoke with mother Wenda LowLynne Lequire [phone] concerning patient's  pickup from the hospital. The parents agreed to pick up the patient this afternoon.

## 2015-03-14 NOTE — [facility] Group Notes (Signed)
[facility]   03/14/2015 9:15 AM  ?  Participation Quality: Alert, Appropriate and Oriented   Mood/Affect: Appropriate  Depression Rating: 2  Anxiety Rating: 4  Thoughts of Suicide: Pt denies SI/HI   Will you contract for safety? Yes   Current AVH: Pt denies   Plan for Discharge/Comments: Pt attended discharge planning group and actively participated in group. CSW provided pt with today's workbook.   Transportation Means: Pt reports access to transportation   Supports: Pt lists his friends and his family as supports.    Dorothe PeaJonathan F. Vaeda Westall, MSW, LCSWA, LCAS

## 2015-03-14 NOTE — Progress Notes (Signed)
Mother of patient called wanted to speak to nurse. Nurse informed mother of discharge plan and mother stated she would check with her husband and call back to let staff know when to pick him up.
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
## 2015-03-14 NOTE — [facility] Suicide Risk Assessment (Signed)
[facility]   Demographic Factors:  Male, Adolescent or young adult and Caucasian  Total Time spent with patient: 30 minutes   Psychiatric Specialty Exam: Physical Exam  ROS   Has this patient used any form of tobacco in the last 30 days? (Cigarettes, Smokeless Tobacco, Cigars, and/or Pipes) No  Mental Status Per Nursing Assessment::   On Admission:     Current Mental Status by Physician: Hopeful, future oriented, mood euthymic affect bright and reactive, calm, pleasant, cooperative participated in programming. Compliant with medications. Has supportive family. Guns have been removed from the home. Patient has a restraining order now  Loss Factors: Loss of significant relationship  Historical Factors: Impulsivity  Risk Reduction Factors:   Sense of responsibility to family, Religious beliefs about death, Employed, Living with another person, especially a relative and Positive social support  Continued Clinical Symptoms:  Previous Psychiatric Diagnoses and Treatments  Cognitive Features That Contribute To Risk:  None    Suicide Risk:  Minimal: No identifiable suicidal ideation.  Patients presenting with no risk factors but with morbid ruminations; may be classified as minimal risk based on the severity of the depressive symptoms  Principal Problem: Severe recurrent major depression without psychotic features [facility](HCC) Discharge Diagnoses:  Patient Active Problem List   Diagnosis Date Noted  . Severe recurrent major depression without psychotic features [facility](HCC) [F33.2] 02/23/2015    Follow-up Information    [facility] with [facility].   Why:  Please call between the hours of 8-5pm M-F to sign up for anger managment classes lasting 12 weeks.  Please arrive as early as possible for prompt service.   Contact information:   [address] [place], [place] [address] Ph: [phone] Fax: [phone]      [facility].   Why:  Please arrive for your appointment on Wednesday, December 21st at 10:45am for your assessment for medication management with Caroline MoreKathleen Sisk   Contact information:   [facility] [address] [place], [place], [place] [address] Ph: [phone] Fax: [phone]      Follow up with [facility].   Why:  Please arrive on Feb 1st, 2016 at 10:15am for your neurology appointment with Dr. Marquette SaaSchadePlease [facility] on Wednesday 03/16/15 at 3pm to see Ramond DialNathan Blake for therapy.  Please arrive15 minutes earlier than the appointment time.   Contact information:   [address] [place] [address] Ph: [phone] Fax: [phone]       Follow up with Ramond DialNathan Blake; [facility].   Why:  [facility] Wednesday 03/16/15 at 3pm to see Ramond DialNathan Blake for therapy.  Please arrive15 minutes earlier than the appointment time and call to reschedule if necessary.   Contact information:   [facility] [address]  [place], [place] [address] [phone]      Is patient on multiple antipsychotic therapies at discharge:  No   Has Patient had three or more failed trials of antipsychotic monotherapy by history:  No  Recommended Plan for Multiple Antipsychotic Therapies: NA    Jimmy FootmanHernandez-Gonzalez,  Jilliann Subramanian 03/14/2015, 9:12 AM

## 2015-03-14 NOTE — Progress Notes (Signed)
  [facility] :  Will you be returning to the same living situation after discharge:  Yes,  pt will return home to live with his parents At discharge, do you have transportation home?: Yes,  pt will be picked up by his parents Do you have the ability to pay for your medications: Yes,  pt will be provided with prescriptions at [facility]  Release of information consent forms completed and in the chart;  Patient's signature needed at discharge.  Patient to Follow up at: Follow-up Information    [facility].   Why:  Please call between the hours of 8-5pm M-F to sign up for anger managment classes lasting 12 weeks.  Please arrive as early as possible for prompt service.   Contact information:   [address] [place], [place] [address] Ph: [phone] Fax: [phone]      [facility] with [facility].   Why:  Please arrive for your appointment on Wednesday, December 21st at 10:45am for your assessment for medication management with Caroline MoreKathleen Sisk   Contact information:   [facility] [address] [place], [place], [place] [address] Ph: [phone] Fax: [phone]      [facility].   Why:  Please arrive on Feb 1st, 2016 at 10:15am for your neurology appointment with Dr. Marquette SaaSchadePlease [facility] on Wednesday 03/16/15 at 3pm to see Ramond DialNathan Blake for therapy.  Please arrive15 minutes earlier than the appointment time.   Contact information:   [address] [place] [address] Ph: [phone] Fax: [phone]       Follow up with Ramond DialNathan Blake; [facility].   Why:  [facility] Wednesday 03/16/15 at 3pm to see Ramond DialNathan Blake for therapy.  Please arrive15 minutes earlier than the appointment time and call to reschedule if necessary.   Contact information:   [facility] [address]  [place], [place]  [phone]      Next level of care provider has access to [facility] Link:no  Safety Planning and Suicide Prevention discussed: Yes,  completed with pt     Has patient been referred to the Quitline?: N/A patient is not a smoker  Patient has been referred for addiction treatment: N/A  Mercy RidingJonathan F Ryheem Jay 03/14/2015, 12:14 PM

## 2015-03-16 NOTE — Progress Notes (Signed)
Recreation Therapy Notes  INPATIENT RECREATION TR PLAN  Patient Details Name: Chase Hampton MRN: [id number] DOB: 05/12/1990 Today's Date: 03/16/2015  Rec Therapy Plan Is patient appropriate for Therapeutic Recreation?: Yes Treatment times per week: At least once a week TR Treatment/Interventions: 1:1 session, Group participation (Comment) (Appropriate participation in daily recreation therapy tx)  Discharge Criteria Pt will be discharged from therapy if:: Treatment goals are met, Discharged Treatment plan/goals/alternatives discussed and agreed upon by:: Patient/family  Discharge Summary Short term goals set: See Care Plan Short term goals met: Complete Progress toward goals comments: One-to-one attended Which groups?: Self-esteem, Coping skills One-to-one attended: Coping skills, time management Reason goals not met: N/A Therapeutic equipment acquired: None Reason patient discharged from therapy: Discharge from hospital Pt/family agrees with progress & goals achieved: Yes Date patient discharged from therapy: 03/14/15   Leonette Monarch, LRT/CTRS 03/16/2015, 1:47 PM

## 2015-03-17 ENCOUNTER — Other Ambulatory Visit: Payer: Self-pay | Admitting: Neurology

## 2015-03-17 DIAGNOSIS — F688 Other specified disorders of adult personality and behavior: Secondary | ICD-10-CM

## 2015-03-29 ENCOUNTER — Ambulatory Visit (INDEPENDENT_AMBULATORY_CARE_PROVIDER_SITE_OTHER): Payer: BLUE CROSS/BLUE SHIELD

## 2015-03-29 ENCOUNTER — Ambulatory Visit
Admission: EM | Admit: 2015-03-29 | Discharge: 2015-03-29 | Disposition: A | Discharge status: Home / Self Care | Payer: BLUE CROSS/BLUE SHIELD | Attending: Family Medicine | Admitting: Family Medicine

## 2015-03-29 DIAGNOSIS — S60222A Contusion of left hand, initial encounter: Secondary | ICD-10-CM | POA: Diagnosis not present

## 2015-03-29 DIAGNOSIS — S60211A Contusion of right wrist, initial encounter: Secondary | ICD-10-CM

## 2015-03-29 MED ORDER — MELOXICAM 15 MG PO TABS: 15.0000 mg | ORAL_TABLET | Freq: Every day | ORAL | Status: DC

## 2015-03-29 NOTE — ED Notes (Signed)
Right thumb and left 5th digit paresthesias for 2 days. Pt also c/o edema of affected areas. "It feels like it's asleep".

## 2015-03-29 NOTE — ED Provider Notes (Signed)
CSN: [id number]     Arrival date & time 03/29/15  1755 History   First MD Initiated Contact with Patient 03/29/15 1946    Nurses notes were reviewed. Chief Complaint  Patient presents with  . Numbness   Patient is here because of numbness and pain of the right thumb and hand and numbness of the left fifth metatarsal area. Patient is having difficulty coping with the engagement ending recently. Apparently he's had some mental illness challenges and his girlfriend called off the wedding that was to take place at the end of the year. Apparently he's not been able to not contact her and she had a restraining order against which subsequently led to his arrest on Sunday and handcuffs being placed. Apparently this did not go well or smoothly and he was handcuffed for an extended amount of time she is being restrained. He has subsequently developed pain in the right thumb and tenderness in the right snuffbox and he also has numbness over the left fifth metacarpal bone as well.  He has subsequently been placed on medication and is calm now but was incarcerated between Sunday and today Tuesday..  While there is a family history of heart disease and diabetes heart disease other than mental illness and GERD he reports no other medical problems. (Consider location/radiation/quality/duration/timing/severity/associated sxs/prior Treatment) Patient is a 25 y.o. male presenting with wrist pain. The history is provided by the patient and a parent. No language interpreter was used.  Wrist Pain This is a new problem. The current episode started more than 2 days ago. The problem occurs constantly. The problem has not changed since onset.Pertinent negatives include no chest pain, no abdominal pain, no headaches and no shortness of breath. The symptoms are aggravated by bending and twisting (Moving his hands worsens the pain and discomfort). The symptoms are relieved by rest. He has tried nothing for the symptoms. The  treatment provided no relief.    Past Medical History  Diagnosis Date  . GERD (gastroesophageal reflux disease)   . Anxiety   . Depression    No past surgical history on file. Family History  Problem Relation Age of Onset  . Heart disease Maternal Grandfather   . Diabetes Maternal Grandfather   . Heart disease Paternal Grandmother    Social History  Substance Use Topics  . Smoking status: Former Smoker    Types: Cigars  . Smokeless tobacco: Never Used  . Alcohol Use: No    Review of Systems  Respiratory: Negative for shortness of breath.   Cardiovascular: Negative for chest pain.  Gastrointestinal: Negative for abdominal pain.  Neurological: Negative for headaches.  All other systems reviewed and are negative.   Allergies  Review of patient's allergies indicates no known allergies.  Home Medications   Prior to Admission medications   Medication Sig Start Date End Date Taking? Authorizing Provider  divalproex (DEPAKOTE ER) 500 MG 24 hr tablet Take 2 tablets (1,000 mg total) by mouth daily. 03/14/15  Yes Jimmy FootmanAndrea Hernandez-Gonzalez, MD  FLUoxetine (PROZAC) 20 MG capsule Take 20 mg by mouth daily.   Yes Historical Provider, MD  LORazepam (ATIVAN) 0.5 MG tablet Take 1 tablet (0.5 mg total) by mouth at bedtime as needed for anxiety. 03/14/15  Yes Jimmy FootmanAndrea Hernandez-Gonzalez, MD  meloxicam (MOBIC) 15 MG tablet Take 1 tablet (15 mg total) by mouth daily. 03/29/15   Hassan RowanEugene Chinedu Agustin, MD   Meds Ordered and Administered this Visit  Medications - No data to display  BP 132/66 mmHg  Pulse 90  Temp(Src) 98.1 F (36.7 C) (Oral)  Resp 16  Ht 5\' 10"  (1.778 m)  Wt 265 lb (120.203 kg)  BMI 38.02 kg/m2  SpO2 98% No data found.   Physical Exam  Constitutional: He is oriented to person, place, and time. He appears well-developed and well-nourished.  HENT:  Head: Normocephalic and atraumatic.  Eyes: Pupils are equal, round, and reactive to light.  Musculoskeletal: He exhibits edema and  tenderness.       Right wrist: He exhibits tenderness and swelling.       Arms:      Right hand: He exhibits decreased range of motion, tenderness, bony tenderness and swelling. He exhibits no deformity. Decreased sensation noted. Decreased sensation is present in the radial distribution.       Left hand: He exhibits tenderness, bony tenderness and swelling.       Hands: Neurological: He is alert and oriented to person, place, and time.  Skin: Skin is warm and dry. No erythema.  Psychiatric: He has a normal mood and affect. His behavior is normal.  Vitals reviewed.   ED Course  Procedures (including critical care time)  Labs Review Labs Reviewed - No data to display  Imaging Review Dg Wrist Complete Right  03/29/2015  CLINICAL DATA:  Radial right wrist pain.  Handcuffed 3 days prior. EXAM: RIGHT WRIST - COMPLETE 3+ VIEW COMPARISON:  None. FINDINGS: No fracture or dislocation. The alignment and joint spaces are maintained. Scaphoid is intact. No focal soft tissue abnormality. IMPRESSION: Negative radiographs of the right wrist. Electronically Signed   By: Rubye Oaks M.D.   On: 03/29/2015 21:37   Dg Hand Complete Left  03/29/2015  CLINICAL DATA:  Left hand pain. Patient was hand cuffed 3 days prior. EXAM: LEFT HAND - COMPLETE 3+ VIEW COMPARISON:  None. FINDINGS: No fracture or dislocation. The alignment and joint spaces are maintained. No focal soft tissue abnormality. IMPRESSION: Negative radiographs of the left hand. Electronically Signed   By: Rubye Oaks M.D.   On: 03/29/2015 21:39     Visual Acuity Review  Right Eye Distance:   Left Eye Distance:   Bilateral Distance:    Right Eye Near:   Left Eye Near:    Bilateral Near:         MDM   1. Wrist contusion, right, initial encounter   2. Hand contusion, left, initial encounter    Patient x-rays were negative. I've extended to him and his mother that the tenderness on the right is over the navicular bone and  because of that we will put him in a right wrist splint with thumb support that he needs where 24 7 and repeat x-ray in 1-2 weeks and take Mobic for pain. On the left wrist indexes are negative that we can just say this is a simple contusion and should be any problems. Ice to keep the swelling down.   Hassan Rowan, MD 03/29/15 2154

## 2015-03-29 NOTE — Discharge Instructions (Signed)
Contusion A contusion is a deep bruise. Contusions happen when an injury causes bleeding under the skin. Symptoms of bruising include pain, swelling, and discolored skin. The skin may turn blue, purple, or yellow. HOME CARE   Rest the injured area.  If told, put ice on the injured area.  Put ice in a plastic bag.  Place a towel between your skin and the bag.  Leave the ice on for 20 minutes, 2-3 times per day.  If told, put light pressure (compression) on the injured area using an elastic bandage. Make sure the bandage is not too tight. Remove it and put it back on as told by your doctor.  If possible, raise (elevate) the injured area above the level of your heart while you are sitting or lying down.  Take over-the-counter and prescription medicines only as told by your doctor. GET HELP IF:  Your symptoms do not get better after several days of treatment.  Your symptoms get worse.  You have trouble moving the injured area. GET HELP RIGHT AWAY IF:   You have very bad pain.  You have a loss of feeling (numbness) in a hand or foot.  Your hand or foot turns pale or cold.   This information is not intended to replace advice given to you by your health care provider. Make sure you discuss any questions you have with your health care provider.   Document Released: 08/29/2007 Document Revised: 12/01/2014 Document Reviewed: 07/28/2014 Elsevier Interactive Patient Education 2016 Else[employer]  Cryotherapy Cryotherapy is when you put ice on your injury. Ice helps lessen pain and puffiness (swelling) after an injury. Ice works the best when you start using it in the first 24 to 48 hours after an injury. HOME CARE  Put a dry or damp towel between the ice pack and your skin.  You may press gently on the ice pack.  Leave the ice on for no more than 10 to 20 minutes at a time.  Check your skin after 5 minutes to make sure your skin is okay.  Rest at least 20 minutes between ice  pack uses.  Stop using ice when your skin loses feeling (numbness).  Do not use ice on someone who cannot tell you when it hurts. This includes small children and people with memory problems (dementia). GET HELP RIGHT AWAY IF:  You have white spots on your skin.  Your skin turns blue or pale.  Your skin feels waxy or hard.  Your puffiness gets worse. MAKE SURE YOU:   Understand these instructions.  Will watch your condition.  Will get help right away if you are not doing well or get worse.   This information is not intended to replace advice given to you by your health care provider. Make sure you discuss any questions you have with your health care provider.   Document Released: 08/29/2007 Document Revised: 06/04/2011 Document Reviewed: 11/02/2010 Elsevier Interactive Patient Education 2016 Else[employer] Inc.  Hand Contusion  A hand contusion is a deep bruise to the hand. Contusions happen when an injury causes bleeding under the skin. Signs of bruising include pain, puffiness (swelling), and discolored skin. The contusion may turn blue, purple, or yellow. HOME CARE  Put ice on the injured area.  Put ice in a plastic bag.  Place a towel between your skin and the bag.  Leave the ice on for 15-20 minutes, 03-04 times a day.  Only take medicines as told by your doctor.  Use an  elastic wrap only as told. You may remove the wrap for sleeping, showering, and bathing. Take the wrap off if you lose feeling (have numbness) in your fingers, or they turn blue or cold. Put the wrap on more loosely.  Keep the hand raised (elevated) with pillows.  Avoid using your hand too much if it is painful. GET HELP RIGHT AWAY IF:   You have more redness, puffiness, or pain in your hand.  Your puffiness or pain does not get better with medicine.  You lose feeling in your hand, or you cannot move your fingers.  Your hand turns cold or blue.  You have pain when you move your fingers.  Your  hand feels warm.  Your contusion does not get better in 2 days. MAKE SURE YOU:   Understand these instructions.  Will watch this condition.  Will get help right away if you are not doing well or you get worse.   This information is not intended to replace advice given to you by your health care provider. Make sure you discuss any questions you have with your health care provider.   Document Released: 08/29/2007 Document Revised: 04/02/2014 Document Reviewed: 09/03/2011 Elsevier Interactive Patient Education [employer] Else[employer] Inc[employer]

## 2015-03-30 ENCOUNTER — Ambulatory Visit: Payer: BLUE CROSS/BLUE SHIELD | Attending: Neurology

## 2015-03-30 DIAGNOSIS — Z8249 Family history of ischemic heart disease and other diseases of the circulatory system: Secondary | ICD-10-CM | POA: Insufficient documentation

## 2015-03-30 DIAGNOSIS — R251 Tremor, unspecified: Secondary | ICD-10-CM | POA: Diagnosis not present

## 2015-03-30 DIAGNOSIS — Z809 Family history of malignant neoplasm, unspecified: Secondary | ICD-10-CM | POA: Diagnosis not present

## 2015-03-30 DIAGNOSIS — G4733 Obstructive sleep apnea (adult) (pediatric): Secondary | ICD-10-CM | POA: Insufficient documentation

## 2015-03-30 DIAGNOSIS — K219 Gastro-esophageal reflux disease without esophagitis: Secondary | ICD-10-CM | POA: Insufficient documentation

## 2015-03-30 DIAGNOSIS — R0683 Snoring: Secondary | ICD-10-CM | POA: Diagnosis present

## 2015-03-30 DIAGNOSIS — F329 Major depressive disorder, single episode, unspecified: Secondary | ICD-10-CM | POA: Diagnosis not present

## 2015-03-30 DIAGNOSIS — Z833 Family history of diabetes mellitus: Secondary | ICD-10-CM | POA: Insufficient documentation

## 2015-04-04 ENCOUNTER — Ambulatory Visit: Payer: Self-pay | Admitting: Family Medicine

## 2015-04-12 ENCOUNTER — Ambulatory Visit: Admission: RE | Admit: 2015-04-12 | Payer: BLUE CROSS/BLUE SHIELD | Source: Ambulatory Visit

## 2015-04-13 ENCOUNTER — Telehealth: Payer: Self-pay

## 2015-04-13 ENCOUNTER — Ambulatory Visit: Payer: Self-pay | Admitting: Family Medicine

## 2015-04-13 NOTE — Telephone Encounter (Signed)
Left message to call and schedule for Flu Shot

## 2015-04-19 ENCOUNTER — Emergency Department
Admission: EM | Admit: 2015-04-19 | Discharge: 2015-04-29 | Disposition: A | Discharge status: Other Acute Inpatient Hospital | Payer: BLUE CROSS/BLUE SHIELD | Attending: Emergency Medicine | Admitting: Emergency Medicine

## 2015-04-19 ENCOUNTER — Encounter: Payer: Self-pay | Admitting: *Deleted

## 2015-04-19 DIAGNOSIS — F919 Conduct disorder, unspecified: Secondary | ICD-10-CM | POA: Diagnosis not present

## 2015-04-19 DIAGNOSIS — Z87891 Personal history of nicotine dependence: Secondary | ICD-10-CM | POA: Diagnosis not present

## 2015-04-19 DIAGNOSIS — S069X9S Unspecified intracranial injury with loss of consciousness of unspecified duration, sequela: Secondary | ICD-10-CM

## 2015-04-19 DIAGNOSIS — Z79899 Other long term (current) drug therapy: Secondary | ICD-10-CM | POA: Diagnosis not present

## 2015-04-19 DIAGNOSIS — F3164 Bipolar disorder, current episode mixed, severe, with psychotic features: Secondary | ICD-10-CM

## 2015-04-19 DIAGNOSIS — R45851 Suicidal ideations: Secondary | ICD-10-CM | POA: Insufficient documentation

## 2015-04-19 DIAGNOSIS — Z791 Long term (current) use of non-steroidal anti-inflammatories (NSAID): Secondary | ICD-10-CM | POA: Insufficient documentation

## 2015-04-19 DIAGNOSIS — F0630 Mood disorder due to known physiological condition, unspecified: Secondary | ICD-10-CM

## 2015-04-19 LAB — COMPREHENSIVE METABOLIC PANEL
ALT: 37 U/L (ref 17–63)
AST: 16 U/L (ref 15–41)
Albumin: 4.1 g/dL (ref 3.5–5.0)
Alkaline Phosphatase: 72 U/L (ref 38–126)
Anion gap: 9 (ref 5–15)
BILIRUBIN TOTAL: 0.9 mg/dL (ref 0.3–1.2)
BUN: 17 mg/dL (ref 6–20)
CHLORIDE: 104 mmol/L (ref 101–111)
CO2: 28 mmol/L (ref 22–32)
CREATININE: 0.96 mg/dL (ref 0.61–1.24)
Calcium: 9.5 mg/dL (ref 8.9–10.3)
Glucose, Bld: 122 mg/dL — ABNORMAL HIGH (ref 65–99)
Potassium: 3.6 mmol/L (ref 3.5–5.1)
Sodium: 141 mmol/L (ref 135–145)
TOTAL PROTEIN: 7.5 g/dL (ref 6.5–8.1)

## 2015-04-19 LAB — CBC
HEMATOCRIT: 44.8 % (ref 40.0–52.0)
HEMOGLOBIN: 15.1 g/dL (ref 13.0–18.0)
MCH: 28.3 pg (ref 26.0–34.0)
MCHC: 33.8 g/dL (ref 32.0–36.0)
MCV: 83.7 fL (ref 80.0–100.0)
Platelets: 188 10*3/uL (ref 150–440)
RBC: 5.35 MIL/uL (ref 4.40–5.90)
RDW: 13.8 % (ref 11.5–14.5)
WBC: 9.5 10*3/uL (ref 3.8–10.6)

## 2015-04-19 LAB — URINE DRUG SCREEN, QUALITATIVE (ARMC ONLY)
AMPHETAMINES, UR SCREEN: NOT DETECTED
BARBITURATES, UR SCREEN: NOT DETECTED
BENZODIAZEPINE, UR SCRN: NOT DETECTED
COCAINE METABOLITE, UR ~~LOC~~: NOT DETECTED
Cannabinoid 50 Ng, Ur ~~LOC~~: NOT DETECTED
MDMA (Ecstasy)Ur Screen: NOT DETECTED
Methadone Scn, Ur: NOT DETECTED
OPIATE, UR SCREEN: NOT DETECTED
PHENCYCLIDINE (PCP) UR S: NOT DETECTED
Tricyclic, Ur Screen: NOT DETECTED

## 2015-04-19 LAB — VALPROIC ACID LEVEL: VALPROIC ACID LVL: 68 ug/mL (ref 50.0–100.0)

## 2015-04-19 LAB — ETHANOL: Alcohol, Ethyl (B): <5 mg/dL (ref <5)

## 2015-04-19 MED ORDER — MELOXICAM 7.5 MG PO TABS
15.0000 mg | ORAL_TABLET | Freq: Every day | ORAL | Status: DC
  Administered 2015-04-20 – 2015-04-29 (×10): 15 mg via ORAL
  Filled 2015-04-19 (×2): qty 2
  Filled 2015-04-19: qty 1
  Filled 2015-04-19 (×6): qty 2

## 2015-04-19 MED ORDER — LORAZEPAM 0.5 MG PO TABS: 0.5000 mg | ORAL_TABLET | Freq: Every evening | ORAL | Status: DC | PRN

## 2015-04-19 MED ORDER — DIVALPROEX SODIUM ER 500 MG PO TB24
1000.0000 mg | ORAL_TABLET | Freq: Every day | ORAL | Status: DC
  Administered 2015-04-20 – 2015-04-29 (×10): 1000 mg via ORAL
  Filled 2015-04-19 (×7): qty 2

## 2015-04-19 MED ORDER — ASENAPINE MALEATE 5 MG SL SUBL
5.0000 mg | SUBLINGUAL_TABLET | Freq: Every day | SUBLINGUAL | Status: DC
  Administered 2015-04-19 – 2015-04-29 (×11): 5 mg via SUBLINGUAL
  Filled 2015-04-19 (×10): qty 1

## 2015-04-19 MED ORDER — FLUOXETINE HCL 20 MG PO CAPS
20.0000 mg | ORAL_CAPSULE | Freq: Every day | ORAL | Status: DC
  Administered 2015-04-20 – 2015-04-26 (×7): 20 mg via ORAL
  Filled 2015-04-19 (×7): qty 1

## 2015-04-19 NOTE — ED Provider Notes (Signed)
Time Seen: Approximately 1850 I have reviewed the triage notes  Chief Complaint: Behavior Problem   History of Present Illness: Chase Hampton is a 25 y.o. male who has a history of suicidal ideation. Patient was recently incarcerated and states that during his stay in the present he had periodic suicidal thoughts. He states he knew he couldn't hurt himself because of his current situation at the prison. Patient denies any current suicidal thoughts, homicidal thoughts, hallucinations. History of substance abuse etc. Patient has a long-standing history of psychiatric illness and was recently started back on his Prozac and Depakote after he been stopped during a stay at an institution in [place]. Patient denies any current physical complaints such as headaches, chest pain, abdominal pain. Past Medical History  Diagnosis Date  . GERD (gastroesophageal reflux disease)   . Anxiety   . Depression     Patient Active Problem List   Diagnosis Date Noted  . Severe recurrent major depression without psychotic features (HCC) 02/23/2015    No past surgical history on file.  No past surgical history on file.  Current Outpatient Rx  Name  Route  Sig  Dispense  Refill  . asenapine (SAPHRIS) 5 MG SUBL 24 hr tablet   Sublingual   Place 5 mg under the tongue Nightly.         . divalproex (DEPAKOTE ER) 500 MG 24 hr tablet   Oral   Take 2 tablets (1,000 mg total) by mouth daily.   60 tablet   0   . FLUoxetine (PROZAC) 20 MG capsule   Oral   Take 20 mg by mouth daily.         Marland Kitchen LORazepam (ATIVAN) 0.5 MG tablet   Oral   Take 1 tablet (0.5 mg total) by mouth at bedtime as needed for anxiety.   30 tablet   0   . meloxicam (MOBIC) 15 MG tablet   Oral   Take 1 tablet (15 mg total) by mouth daily.   30 tablet   1     Allergies:  Haldol  Family History: Family History  Problem Relation Age of Onset  . Heart disease Maternal Grandfather   . Diabetes Maternal Grandfather    . Heart disease Paternal Grandmother     Social History: Social History  Substance Use Topics  . Smoking status: Former Smoker    Types: Cigars  . Smokeless tobacco: Never Used  . Alcohol Use: No     Review of Systems:   10 point review of systems was performed and was otherwise negative:  Constitutional: No fever Eyes: No visual disturbances ENT: No sore throat, ear pain Cardiac: No chest pain Respiratory: No shortness of breath, wheezing, or stridor Abdomen: No abdominal pain, no vomiting, No diarrhea Endocrine: No weight loss, No night sweats Extremities: No peripheral edema, cyanosis Skin: No rashes, easy bruising Neurologic: No focal weakness, trouble with speech or swollowing Urologic: No dysuria, Hematuria, or urinary frequency   Physical Exam:  ED Triage Vitals  Enc Vitals Group     BP 04/19/15 1825 130/85 mmHg     Pulse Rate 04/19/15 1825 96     Resp 04/19/15 1825 20     Temp 04/19/15 1825 97.4 F (36.3 C)     Temp Source 04/19/15 1825 Oral     SpO2 04/19/15 1825 97 %     Weight 04/19/15 1825 270 lb (122.471 kg)     Height 04/19/15 1825  (1.753 m)  Head Cir --      Peak Flow --      Pain Score 04/19/15 2139 0     Pain Loc --      Pain Edu? --      Excl. in GC? --     General: Awake , Alert , and Oriented times 3; GCS 15 Head: Normal cephalic , atraumatic Eyes: Pupils equal , round, reactive to light Nose/Throat: No nasal drainage, patent upper airway without erythema or exudate.  Neck: Supple, Full range of motion, No anterior adenopathy or palpable thyroid masses Lungs: Clear to ascultation without wheezes , rhonchi, or rales Heart: Regular rate, regular rhythm without murmurs , gallops , or rubs Abdomen: Soft, non tender without rebound, guarding , or rigidity; bowel sounds positive and symmetric in all 4 quadrants. No organomegaly .        Extremities: 2 plus symmetric pulses. No edema, clubbing or cyanosis Neurologic: normal  ambulation, Motor symmetric without deficits, sensory intact Skin: warm, dry, no rashes   Labs:   All laboratory work was reviewed including any pertinent negatives or positives listed below:  Labs Reviewed  COMPREHENSIVE METABOLIC PANEL - Abnormal; Notable for the following:    Glucose, Bld 122 (*)    All other components within normal limits  ETHANOL  CBC  URINE DRUG SCREEN, QUALITATIVE ([facility] ONLY)  VALPROIC ACID LEVEL       ED Course:  Patient's stay here was overall uneventful. He is been cooperative since his recent transport from from the jail. He is currently on IVC paperwork and I elected to continue that paperwork until he can be seen by psychiatry. Patient will be continued on all of his current medications and his Depakote levels appear to be therapeutic at this time. The patient is currently medically cleared  Assessment:  Suicidal ideation     Plan:  Psychiatric observation            Jennye Moccasin, MD 04/19/15 2147

## 2015-04-19 NOTE — ED Notes (Signed)
Report received from Jaclyn Prime., RN. Pt. Alert and oriented in no distress denies SI, HI, AVH and pain.  Pt. Instructed to come to me with problems or concerns.Will continue to monitor for safety via security cameras and Q 15 minute checks.
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
## 2015-04-19 NOTE — ED Notes (Addendum)
Pt to triage in a wheelchair handcuffed and schackled.  Pt calm and cooperative.  Pt is IVC from the jail.  Pt states last night while in jail he was SI.  Pt denies SI or HI now.

## 2015-04-19 NOTE — ED Notes (Signed)
Pt. To ED-B[facility] from ED ambulatory without difficulty, to room #3 . Report from RN. Pt. Is alert and oriented, warm and dry in no distress. Pt. Denies SI, HI, and AVH. Presents for c/o having Mood D/O and legal issues. Pt. Is currently calm and cooperative; food and fluids offered; and has had a tour of the unit. Pt. Made aware of security cameras and Q15 minute rounds. Pt. Encouraged to let Nursing staff know of any concerns or needs.

## 2015-04-20 DIAGNOSIS — F3164 Bipolar disorder, current episode mixed, severe, with psychotic features: Secondary | ICD-10-CM | POA: Diagnosis not present

## 2015-04-20 NOTE — ED Notes (Signed)
Pt. Noted in the day room watching the tv. No complaints or concerns voiced. No distress or abnormal behavior noted. Will continue to monitor with security cameras. Q 15 minute rounds continue. 

## 2015-04-20 NOTE — ED Notes (Signed)
Report received from Reather Converse., RN. Pt. Alert and oriented in no distress; verbalizes having SI; and is requesting to go to [facility]; denies having HI, AVH and pain.  Pt. Instructed to come to me with problems or concerns.Will continue to monitor for safety via security cameras and Q 15 minute checks.

## 2015-04-20 NOTE — ED Notes (Signed)
Patient currently denies SI and HI but states that these feelings are consistently passive. Patient does not report AVH or pain. Patient endorses depression related to incarceration and broken relationship with family but denies anxiety at this time. Expresses desire to receive long term inpatient treatment. Maintained on 15 minute checks and observation by security camera for safety.

## 2015-04-20 NOTE — ED Notes (Signed)
IVC/Consult completed/ All Legal paperwork on chart / pending placement
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
## 2015-04-20 NOTE — ED Notes (Signed)
Patient resting quietly in room. No noted distress or abnormal behaviors noted. Will continue 15 minute checks and observation by security camera for safety. 

## 2015-04-20 NOTE — ED Notes (Signed)
Patient received breakfast tray 

## 2015-04-20 NOTE — BHH Counselor (Signed)
Received phone call from [place] from [employer] to report tracking number #[id number].  Provided tracking number to [facility].

## 2015-04-20 NOTE — ED Notes (Signed)
Patient received dinner tray. No signs of distress noted as patient rests in the dayroom. Maintained on 15 minute checks and observation by security camera for safety.

## 2015-04-20 NOTE — ED Notes (Signed)
Patient asleep in room. No noted distress or abnormal behavior. Will continue 15 minute checks and observation by security cameras for safety. 

## 2015-04-20 NOTE — ED Notes (Signed)
Pt. Noted in room awake watching the tv. No complaints or concerns voiced. No distress or abnormal behavior noted. Will continue to monitor with security cameras. Q 15 minute rounds continue. 

## 2015-04-20 NOTE — ED Notes (Signed)
Dr Clapacs here 

## 2015-04-20 NOTE — ED Notes (Signed)
Patient mother called [name] [phone]) and wished to know the treatment plan for her son. Writer informed her that patient is pending psychiatric evaluation. Mother wished to be informed of any treatment decisions and to be reached by staff if needed for more information.

## 2015-04-20 NOTE — ED Notes (Signed)
Several police officers came onto the unit to inform nursing staff and security that they would try to find an avenue to expedite patient's admittance to a long-term inpatient facility. Police officers then agreed to speak with parents about any concerns that they had concerning where the patient would be placed.

## 2015-04-20 NOTE — ED Notes (Signed)
Patient received lunch tray 

## 2015-04-20 NOTE — ED Notes (Signed)
Patient currently using the phone. Patient is calm at this time. No signs of distress noted. Maintained on 15 minute checks and observation by security camera for safety.

## 2015-04-20 NOTE — ED Notes (Signed)
Patient complains of numbness along the lateral 1st digit of the right and lateral 5th digit of the left hand. States that this numbness has been persistent over the past couple of weeks. Psychiatrist notified.

## 2015-04-20 NOTE — ED Notes (Signed)
Patient currently has family visiting. Patient is calm at this time, displays no signs of distress. Maintained on 15 minute checks and observation by security camera for safety.

## 2015-04-20 NOTE — BHH Counselor (Signed)
Received call from Jane with [facility].  Pt is on waitlist.

## 2015-04-20 NOTE — ED Notes (Signed)
A telephone call was received from Detective Denny--[phone] to check on client's status; abd he was told by this Clinical research associate; that client will be seen by a psychiatrist today. Detective Katherina Right stated that; he'l call back later today; to speak with the unit nurse and the Triage Specialist about client's status.

## 2015-04-20 NOTE — ED Notes (Signed)
ENVIRONMENTAL ASSESSMENT Potentially harmful objects out of patient reach: Yes Personal belongings secured: Yes Patient dressed in hospital provided attire only: Yes Plastic bags out of patient reach: Yes Patient care equipment (cords, cables, call bells, lines, and drains) shortened, removed, or accounted for: Yes Equipment and supplies removed from bottom of stretcher: Yes Potentially toxic materials out of patient reach: Yes Sharps container removed or out of patient reach: Yes  Patient currently sleeping. No signs of distress noted. Maintained on 15 minute checks and observation by security camera for safety.  

## 2015-04-20 NOTE — BH Assessment (Signed)
Patient Referred to [facility].  Verbal Screening Completed with [facility] ([phone])   Information faxed to [employer] (Michelle-[phone]), waiting on Tracking/Auth Number for Regional Referral.   Referral information faxed to [facility] ([phone]) and confirmed it was received.  Hospital Denials: [facility] ([phone]) [facility] (Shante-[phone]) [facility] (Hope-[phone])

## 2015-04-21 DIAGNOSIS — F3164 Bipolar disorder, current episode mixed, severe, with psychotic features: Secondary | ICD-10-CM | POA: Diagnosis not present

## 2015-04-21 NOTE — ED Notes (Signed)
Patient with visitor in room, and Chase Hampton standing outside of door monitoring visit.

## 2015-04-21 NOTE — ED Notes (Signed)
Patient watching tv, talked with patient , denies Si/HI, He is alert and oriented, has been calm, cooperative, camera monitoring 24 hours daily, and q 15 min. Checks.

## 2015-04-21 NOTE — ED Provider Notes (Signed)
-----------------------------------------   5:11 AM on 04/21/2015 -----------------------------------------   Blood pressure 123/69, pulse 102, temperature 97.7 F (36.5 C), temperature source Oral, resp. rate 18, height  (1.753 m), weight 122.471 kg, SpO2 99 %.  The patient had no acute events since last update.  Calm and cooperative at this time.  Disposition is pending per Psychiatry/Behavioral Medicine team recommendations; the patient is currently on the wait list for [facility].     Loleta Rose, MD 04/21/15 [phone]

## 2015-04-21 NOTE — ED Notes (Signed)
Snack and beverage given. 

## 2015-04-21 NOTE — ED Notes (Signed)
Report received from Amy B., RN. Pt. Alert and oriented in no distress denies SI, HI, AVH and pain.  Pt. Instructed to come to me with problems or concerns.Will continue to monitor for safety via security cameras and Q 15 minute checks. 

## 2015-04-21 NOTE — ED Notes (Signed)
Pt. Noted in room sleeping;. No complaints or concerns voiced. No distress or abnormal behavior noted. Will continue to monitor with security cameras. Q 15 minute rounds continue. 

## 2015-04-21 NOTE — ED Notes (Signed)
Patient oriented, states that He knows that He needs help, states " I can be fine and then snap like a light switch, and get out of control, I don't know what I might do" I am afraid of what I might do" Nurse listened and told him to let her know if He started to have in changes in His mood, and talked about triggers and how to cope with stressors more positively.

## 2015-04-21 NOTE — ED Notes (Signed)
Nurse talked with patient about how He was feeling, Patient smiling and is pleasant, but He did tell nurse that He was afraid of His own self and that He can black out when He has emotional stresses. Patient is fixated on His girlfriend and states she knew i was having problems and did not tell anyone, Patient is blaming her for His actions. Nurse tried to redirect and He would then change and say " yes I have to work on me" denies si/hi at this time.

## 2015-04-21 NOTE — ED Notes (Signed)
Reported off to oncoming nurse

## 2015-04-21 NOTE — ED Notes (Signed)
Pt. Noted in  room watching the tv.;. No complaints or concerns voiced. No distress or abnormal behavior noted. Will continue to monitor with security cameras. Q 15 minute rounds continue. 

## 2015-04-21 NOTE — ED Notes (Signed)
Pt. Noted in the day room watching the tv.; stating, "I'm just waiting for a bed to open up.". No complaints or concerns voiced. No distress or abnormal behavior noted. Will continue to monitor with security cameras. Q 15 minute rounds continue.

## 2015-04-21 NOTE — Consult Note (Signed)
[place] [facility] Consult   Reason for Consult:  Follow-up for 25 year old man with mood instability and personality disorder-like behavior history of violent homicidal threats and suicidal threats. Currently in the emergency room because of referral from jail. Referring Physician:  Thomasene Lot Patient Identification: Chase Hampton MRN:  [id number] Principal Diagnosis: Bipolar disorder, curr episode mixed, severe, with psychotic features [facility]) Diagnosis:   Patient Active Problem List   Diagnosis Date Noted  . Bipolar disorder, curr episode mixed, severe, with psychotic features ([place]) [F31.64] 04/20/2015  . Suicidal ideation [R45.851] 04/20/2015  . Homicidal ideation [R45.850] 04/20/2015  . Problems related to other legal circumstances [Z65.3] 04/20/2015    Total Time spent with patient: 30 minutes  Subjective:   Chase Hampton is a 25 y.o. male patient admitted with "I guess I'm doing okay".  HPI:  Update from yesterday. Patient has no new complaints. Mood is stated as being stable. Denies feeling depressed. Denies any acute suicidal or homicidal ideation. Still has some level of anxiety. No psychotic symptoms reported. No side effects from medicine  Past Psychiatric History: History of treatment for mood instability and depression. History of violence threats and suicidality all in the context of his past relationship with his ex-girlfriend  Risk to Self: Suicidal Ideation: No-Not Currently/Within Last 6 Months Suicidal Intent: No-Not Currently/Within Last 6 Months Is patient at risk for suicide?: No Suicidal Plan?: No-Not Currently/Within Last 6 Months Access to Means: No Specify Access to Suicidal Means: Currently hospitalized What has been your use of drugs/alcohol within the last 12 months?: social drinking How many times?: 3 Other Self Harm Risks: denied Triggers for Past Attempts: Other (Comment) (Emotional and relationship stressors) Intentional Self Injurious  Behavior: None Risk to Others: Homicidal Ideation: No Thoughts of Harm to Others: No-Not Currently Present/Within Last 6 Months Current Homicidal Intent: No-Not Currently/Within Last 6 Months Current Homicidal Plan: No Access to Homicidal Means: No Identified Victim: Denied History of harm to others?: No Assessment of Violence: None Noted Violent Behavior Description: denied Does patient have access to weapons?: No Criminal Charges Pending?: Yes Describe Pending Criminal Charges: Breaking a restraining order, B&E, Resisting arrest,  Does patient have a court date: Yes Court Date: 04/19/15 (04/18/2015, 05/06/2015, 05/10/2015, 06/14/2015 ) Prior Inpatient Therapy: Prior Inpatient Therapy: Yes Prior Therapy Dates: 02/2015 (3 placements in the last month) Prior Therapy Facilty/Provider(s): [place] Reason for Treatment: Major Depression Prior Outpatient Therapy: Prior Outpatient Therapy: Yes Prior Therapy Dates: Current Prior Therapy Facilty/Provider(s): [facility]  Reason for Treatment: Depression  Does patient have an ACCT team?: No Does patient have Intensive In-House Services?  : No Does patient have [facility] services? : No Does patient have P4CC services?: No  Past Medical History:  Past Medical History  Diagnosis Date  . GERD (gastroesophageal reflux disease)   . Anxiety   . Depression    No past surgical history on file. Family History:  Family History  Problem Relation Age of Onset  . Heart disease Maternal Grandfather   . Diabetes Maternal Grandfather   . Heart disease Paternal Grandmother    Family Psychiatric  History: Nonidentified right now Social History:  History  Alcohol Use No     History  Drug Use  . Yes  . Special: Marijuana    Social History   Social History  . Marital Status: Single    Spouse Name: N/A  . Number of Children: N/A  . Years of Education: N/A   Social History Main  Topics  . Smoking  status: Former Smoker    Types: Cigars  . Smokeless tobacco: Never Used  . Alcohol Use: No  . Drug Use: Yes    Special: Marijuana  . Sexual Activity: Not Asked   Other Topics Concern  . None   Social History Narrative   Additional Social History:    History of alcohol / drug use?: No history of alcohol / drug abuse (Reports social use of alcohol)                     Allergies:   Allergies  Allergen Reactions  . Haldol [Haloperidol Lactate] Other (See Comments)    Labs:  Results for orders placed or performed during the hospital encounter of 04/19/15 (from the past 48 hour(s))  Ethanol (ETOH)     Status: None   Collection Time: 04/19/15  6:28 PM  Result Value Ref Range   Alcohol, Ethyl (B) <5 <5 mg/dL    Comment:        LOWEST DETECTABLE LIMIT FOR SERUM ALCOHOL IS 5 mg/dL FOR MEDICAL PURPOSES ONLY   CBC     Status: None   Collection Time: 04/19/15  6:28 PM  Result Value Ref Range   WBC 9.5 3.8 - 10.6 K/uL   RBC 5.35 4.40 - 5.90 MIL/uL   Hemoglobin 15.1 13.0 - 18.0 g/dL   HCT 44.8 40.0 - 52.0 %   MCV 83.7 80.0 - 100.0 fL   MCH 28.3 26.0 - 34.0 pg   MCHC 33.8 32.0 - 36.0 g/dL   RDW 13.8 11.5 - 14.5 %   Platelets 188 150 - 440 K/uL  Urine Drug Screen, Qualitative ([facility] only)     Status: None   Collection Time: 04/19/15  6:28 PM  Result Value Ref Range   Tricyclic, Ur Screen NONE DETECTED NONE DETECTED   Amphetamines, Ur Screen NONE DETECTED NONE DETECTED   MDMA (Ecstasy)Ur Screen NONE DETECTED NONE DETECTED   Cocaine Metabolite,Ur [place] NONE DETECTED NONE DETECTED   Opiate, Ur Screen NONE DETECTED NONE DETECTED   Phencyclidine (PCP) Ur S NONE DETECTED NONE DETECTED   Cannabinoid 50 Ng, Ur [place] NONE DETECTED NONE DETECTED   Barbiturates, Ur Screen NONE DETECTED NONE DETECTED   Benzodiazepine, Ur Scrn NONE DETECTED NONE DETECTED   Methadone Scn, Ur NONE DETECTED NONE DETECTED    Comment: (NOTE) 308  Tricyclics, urine               Cutoff 1000 ng/mL 200   Amphetamines, urine             Cutoff 1000 ng/mL 300  MDMA (Ecstasy), urine           Cutoff 500 ng/mL 400  Cocaine Metabolite, urine       Cutoff 300 ng/mL 500  Opiate, urine                   Cutoff 300 ng/mL 600  Phencyclidine (PCP), urine      Cutoff 25 ng/mL 700  Cannabinoid, urine              Cutoff 50 ng/mL 800  Barbiturates, urine             Cutoff 200 ng/mL 900  Benzodiazepine, urine           Cutoff 200 ng/mL 1000 Methadone, urine                Cutoff 300 ng/mL 1100 1200 The urine  drug screen provides only a preliminary, unconfirmed 1300 analytical test result and should not be used for non-medical 1400 purposes. Clinical consideration and professional judgment should 1500 be applied to any positive drug screen result due to possible 1600 interfering substances. A more specific alternate chemical method 1700 must be used in order to obtain a confirmed analytical result.  1800 Gas chromato graphy / mass spectrometry (GC/MS) is the preferred 1900 confirmatory method.   Comprehensive metabolic panel     Status: Abnormal   Collection Time: 04/19/15  6:28 PM  Result Value Ref Range   Sodium 141 135 - 145 mmol/L   Potassium 3.6 3.5 - 5.1 mmol/L   Chloride 104 101 - 111 mmol/L   CO2 28 22 - 32 mmol/L   Glucose, Bld 122 (H) 65 - 99 mg/dL   BUN 17 6 - 20 mg/dL   Creatinine, Ser 0.96 0.61 - 1.24 mg/dL   Calcium 9.5 8.9 - 10.3 mg/dL   Total Protein 7.5 6.5 - 8.1 g/dL   Albumin 4.1 3.5 - 5.0 g/dL   AST 16 15 - 41 U/L   ALT 37 17 - 63 U/L   Alkaline Phosphatase 72 38 - 126 U/L   Total Bilirubin 0.9 0.3 - 1.2 mg/dL   GFR calc non Af Amer >60 >60 mL/min   GFR calc Af Amer >60 >60 mL/min    Comment: (NOTE) The eGFR has been calculated using the CKD EPI equation. This calculation has not been validated in all clinical situations. eGFR's persistently <60 mL/min signify possible Chronic Kidney Disease.    Anion gap 9 5 - 15  Valproic acid level     Status: None   Collection  Time: 04/19/15  6:28 PM  Result Value Ref Range   Valproic Acid Lvl 68 50.0 - 100.0 ug/mL    Current Facility-Administered Medications  Medication Dose Route Frequency Provider Last Rate Last Dose  . asenapine (SAPHRIS) sublingual tablet 5 mg  5 mg Sublingual QHS Gregor Hams, MD   5 mg at 04/20/15 2126  . divalproex (DEPAKOTE ER) 24 hr tablet 1,000 mg  1,000 mg Oral Daily Daymon Larsen, MD   1,000 mg at 04/21/15 1013  . FLUoxetine (PROZAC) capsule 20 mg  20 mg Oral Daily Daymon Larsen, MD   20 mg at 04/21/15 1013  . LORazepam (ATIVAN) tablet 0.5 mg  0.5 mg Oral QHS PRN Daymon Larsen, MD      . meloxicam [facility]) tablet 15 mg  15 mg Oral Daily Daymon Larsen, MD   15 mg at 04/21/15 1012   Current Outpatient Prescriptions  Medication Sig Dispense Refill  . asenapine (SAPHRIS) 5 MG SUBL 24 hr tablet Place 5 mg under the tongue Nightly.    . divalproex (DEPAKOTE ER) 500 MG 24 hr tablet Take 2 tablets (1,000 mg total) by mouth daily. 60 tablet 0  . FLUoxetine (PROZAC) 20 MG capsule Take 20 mg by mouth daily.    Marland Kitchen LORazepam (ATIVAN) 0.5 MG tablet Take 1 tablet (0.5 mg total) by mouth at bedtime as needed for anxiety. 30 tablet 0  . meloxicam (MOBIC) 15 MG tablet Take 1 tablet (15 mg total) by mouth daily. 30 tablet 1    Musculoskeletal: Strength & Muscle Tone: within normal limits Gait & Station: normal Patient leans: N/A  Psychiatric Specialty Exam: Review of Systems  Constitutional: Negative.   HENT: Negative.   Eyes: Negative.   Respiratory: Negative.   Cardiovascular: Negative.  Gastrointestinal: Negative.   Musculoskeletal: Negative.   Skin: Negative.   Neurological: Negative.   Psychiatric/Behavioral: Negative for depression, suicidal ideas, hallucinations, memory loss and substance abuse. The patient is nervous/anxious. The patient does not have insomnia.     Blood pressure 117/79, pulse 75, temperature 98.3 F (36.8 C), temperature source Oral, resp. rate 20,  height '5\' 9"'  (1.753 m), weight 122.471 kg (270 lb), SpO2 99 %.Body mass index is 39.85 kg/(m^2).  General Appearance: Casual  Eye Contact::  Fair  Speech:  Normal Rate  Volume:  Normal  Mood:  Euthymic  Affect:  Flat  Thought Process:  Intact  Orientation:  Full (Time, Place, and Person)  Thought Content:  Negative  Suicidal Thoughts:  No  Homicidal Thoughts:  No  Memory:  Immediate;   Fair Recent;   Fair Remote;   Fair  Judgement:  Impaired  Insight:  Shallow  Psychomotor Activity:  Normal  Concentration:  Fair  Recall:  [place]  Language: Fair  Akathisia:  No  Handed:  Right  AIMS (if indicated):     Assets:  Communication Skills Desire for Improvement Financial Resources/Insurance Housing Physical Health Resilience Social Support  ADL's:  Intact  Cognition: WNL  Sleep:      Treatment Plan Summary: Medication management and Plan Patient currently in the emergency room. Under IVC order. Because of the chronicity and tenacity of his behavior and illness and resistance to recent treatments including multiple hospitalizations as well as the dangerousness of his behavior I am recommending that we refer him to a longer term facility such as the state hospital preferably to a forensic bed because of his legal problems. Despite his current presentation the patient remains high risk for dangerous behavior given his multiple previous episodes. He is tolerating medicine well. Continue current medicine as prescribed. We have worked on the referral to [employer].  Disposition: See note above. Trying to refer patient to longer term more appropriate and potentially forensic setting  Alethia Berthold 04/21/2015 4:23 PM

## 2015-04-21 NOTE — ED Notes (Signed)
Patient up eating breakfast, Patient alert and oriented, denies Si/HI, or AVH.

## 2015-04-22 DIAGNOSIS — F3164 Bipolar disorder, current episode mixed, severe, with psychotic features: Secondary | ICD-10-CM | POA: Diagnosis not present

## 2015-04-22 MED ORDER — LORAZEPAM 1 MG PO TABS
1.0000 mg | ORAL_TABLET | Freq: Three times a day (TID) | ORAL | Status: DC | PRN
  Administered 2015-04-22 – 2015-04-29 (×7): 1 mg via ORAL
  Filled 2015-04-22 (×7): qty 1

## 2015-04-22 MED ORDER — LORAZEPAM 1 MG PO TABS
ORAL_TABLET | ORAL | Status: AC
  Administered 2015-04-22: 1 mg via ORAL
  Filled 2015-04-22: qty 1

## 2015-04-22 NOTE — ED Notes (Signed)
Patient resting quietly in room. No noted distress or abnormal behaviors noted. Will continue 15 minute checks and observation by security camera for safety. 

## 2015-04-22 NOTE — ED Notes (Signed)
Maintained on 15 minute checks and observation by security camera for safety. 

## 2015-04-22 NOTE — ED Provider Notes (Signed)
-----------------------------------------   7:08 AM on 04/22/2015 -----------------------------------------   Blood pressure 117/79, pulse 75, temperature 98.3 F (36.8 C), temperature source Oral, resp. rate 20, height  (1.753 m), weight 270 lb (122.471 kg), SpO2 99 %.  The patient had no acute events since last update.  Calm and cooperative at this time.  Disposition is pending per Psychiatry/Behavioral Medicine team recommendations.     Jennye Moccasin, MD 04/22/15 [phone]

## 2015-04-22 NOTE — ED Notes (Signed)
Patient asleep in room. No noted distress or abnormal behavior. Will continue 15 minute checks and observation by security cameras for safety. 

## 2015-04-22 NOTE — ED Notes (Signed)

## 2015-04-22 NOTE — BH Assessment (Signed)
Confirmed with [facility] ([phone]), patient remain on their Waitlist.

## 2015-04-22 NOTE — ED Notes (Signed)
Patient states he is not concerned about his legal issues because he believes his lawyer will use his diagnosis as a defense. Patient says when he has one of his "episodes" he dissociates and should not be held responsible for his actions.  Patient asserts that he is depressed but his affect is bright, he is animated during conversations, and he denies SI.  Patient is waiting for a bed at an inpatient facility.Maintained on 15 minute checks and observation by security camera for safety.

## 2015-04-22 NOTE — ED Notes (Signed)
IVC/ Patient on [facility] wait list at this time 

## 2015-04-22 NOTE — ED Notes (Signed)
Patient visiting with parents. No problems noticed. Maintained on all safety precautions.

## 2015-04-22 NOTE — Consult Note (Signed)
  Psychiatry: Follow-up for this 25 year old man with severe behavior disturbance with threats of suicide and homicide. 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 Patient today has no new complaints. He says that he woke up today feeling a little bit agitated but he was able to calm himself down. He says his mood is now feeling stable. He denies any hallucinations. He denies acute suicidal or homicidal thoughts. He denies any physical symptoms. Patient is alert and oriented 4 cooperative and pleasant. Good eye contact. Psychomotor activity normal. Speech normal. Affect euthymic. No sign of psychotic thinking. Denies suicidal or homicidal ideation. Immediate judgment and insight adequate.  No change to diagnosis. Tolerating medication adequately. As mentioned previously because of his complicated legal situation I have suggested that a better environment for him would be at the state hospital preferably at a forensic bed. We are making referrals at this time. If for any other treatment changes for now. I will continue to follow up daily.

## 2015-04-22 NOTE — ED Notes (Signed)
Patient has been quiet, cooperative.  Maintained on 15 minute checks and observation by security camera for safety.

## 2015-04-22 NOTE — ED Notes (Signed)
Patient alert, awake, and oriented. States he is worried about his "personal and mental problems." He says he is angry because his girlfriend was "not supportive." Declined any further details.  Patient was cooperative with taking all scheduled medications. He took a shower. Will continue all safety precautions.

## 2015-04-22 NOTE — ED Notes (Signed)
Meal served. Patient in room, watching TV. In no apparent distress. Maintained on 15 minute checks and observation by security camera for safety.

## 2015-04-23 NOTE — ED Notes (Addendum)
Pt mother called at the same time this conversation was going on she was crying and upset and said that the pt was very agitated and that he was very angry at them ,because he is still here.,he spoke with his family during phone time

## 2015-04-23 NOTE — ED Notes (Signed)
IVC/ Patient on [facility] wait list at this time

## 2015-04-23 NOTE — ED Notes (Signed)
Patient denies SI/HI/AVH and pain. He states that he feels that his depression and anxiety has improved. Patient is calm and cooperative at this time, no complaints. Will continue to monitor. Maintained on 15 minute checks and observation by security camera for safety.

## 2015-04-23 NOTE — ED Notes (Signed)
Patient resting comfortably in his bed. No signs of distress noted. Maintained on 15 minute checks and observation by security camera for safety.

## 2015-04-23 NOTE — ED Notes (Signed)
ENVIRONMENTAL ASSESSMENT Potentially harmful objects out of patient reach: Yes Personal belongings secured: Yes Patient dressed in hospital provided attire only: Yes Plastic bags out of patient reach: Yes Patient care equipment (cords, cables, call bells, lines, and drains) shortened, removed, or accounted for: Yes Equipment and supplies removed from bottom of stretcher: Yes Potentially toxic materials out of patient reach: Yes Sharps container removed or out of patient reach: Yes  Patient in room sleeping. No signs of distress noted. Maintained on 15 minute checks and observation by security camera for safety.  

## 2015-04-23 NOTE — ED Notes (Signed)
Patient resting quietly in room. Patient had a cordial visit with family.  No noted distress or abnormal behaviors noted. Will continue 15 minute checks and observation by security camera for safety.

## 2015-04-23 NOTE — ED Notes (Signed)
Patient currently has visitors. No signs of distress noted. Will continue to monitor for safety. Maintained on 15 minute checks and observation by security camera for safety.

## 2015-04-23 NOTE — ED Notes (Signed)
Pt awake and alert this evening. Pt mood is frustrated and his affect is depressed. Pt states that he is frustrated about being stuck on this unit rather than going downstairs to wait on the in patient unit. Writer discussed developing plan of care and encouraged him to be patient while waiting for a bed come available. Pt also requested to see his medical records. Pt is pleasant and cooperative despite being frustrated. Writer printed medical records request form. PRN medication orders adjusted, and 15 minute checks ongoing for safety.

## 2015-04-23 NOTE — ED Notes (Signed)
Patient currently resting and watching television. No complaints or signs of discomfort at this time. Patient is calm and cooperative. Maintained on 15 minute checks and observation by security camera for safety.

## 2015-04-23 NOTE — ED Notes (Signed)
Patient resting quietly in room. No noted distress or abnormal behaviors noted. Will continue 15 minute checks and observation by security camera for safety. 

## 2015-04-23 NOTE — ED Notes (Signed)
Patient received breakfast tray 

## 2015-04-23 NOTE — BH Assessment (Signed)
Confirmed with [facility] ([phone]), patient remain on their Waitlist.

## 2015-04-23 NOTE — ED Notes (Signed)
Patient currently on the phone. No signs of distress noted. Maintained on 15 minute checks and observation by security camera for safety.

## 2015-04-23 NOTE — ED Notes (Signed)
Pt is awake watching TV this evening. Pt denies SI/HI and AVH at this time and is calm and cooperative with staff. 15 minute checks are ongoing for safety. Pt went to sleep following medication administration.

## 2015-04-23 NOTE — ED Notes (Signed)
Patient asleep in room. No noted distress or abnormal behavior. Will continue 15 minute checks and observation by security cameras for safety. 

## 2015-04-23 NOTE — ED Provider Notes (Signed)
-----------------------------------------   6:43 AM on 04/23/2015 -----------------------------------------   Blood pressure 133/94, pulse 80, temperature 98 F (36.7 C), temperature source Oral, resp. rate 20, height  (1.753 m), weight 122.471 kg, SpO2 98 %.  The patient had no acute events since last update.  Calm and cooperative at this time.  Disposition is pending per Psychiatry/Behavioral Medicine team recommendations.  The patient is on a wait list for [facility]     Loleta Rose, MD 04/23/15 [phone]

## 2015-04-23 NOTE — ED Notes (Signed)
Patient came to nurse quite frustrated because he did not feel like he was informed of the treatment plan and he said that he wanted to receive treatment in the inpatient unit. Patient became quite red and the face and upset while talking to the nurse and stated that he was frustrated that he had a diagnosis of bipolar disorder also on his record. Nurse spoke with patient at length and allowed him to share his concerns, nurse offered therapeutic support and patient de-escalated. Nurse then offered patient ativan when needed and patient refused at that time. At the end of conversation patient was calm and sat in the dayroom to watch T.V. Will continue to monitor for further escalation and for safety. Maintained on 15 minute checks and observation by security camera for safety.

## 2015-04-23 NOTE — ED Notes (Signed)
Patient is currently on the phone. No signs of distress noted. Maintained on 15 minute checks and observation by security camera for safety.

## 2015-04-24 NOTE — ED Notes (Addendum)
Mother called and asked that patient be asked if the parents could be granted medical power of attorney. Nurse spoke with patient who stated that he did not wish for them to be power of attorney. Nurse asked if he wanted to divulge that information himself to his parents and he said "yes". Nurse then informed mother that he would disclose his decision to the parents at a later time.   Patient is currently calm and cooperative and says that he will accept visitors today. Will continue to monitor for agitation and safety Q15 minutes .

## 2015-04-24 NOTE — ED Notes (Signed)
ENVIRONMENTAL ASSESSMENT Potentially harmful objects out of patient reach: Yes Personal belongings secured: Yes Patient dressed in hospital provided attire only: Yes Plastic bags out of patient reach: Yes Patient care equipment (cords, cables, call bells, lines, and drains) shortened, removed, or accounted for: Yes Equipment and supplies removed from bottom of stretcher: Yes Potentially toxic materials out of patient reach: Yes Sharps container removed or out of patient reach: Yes  Patient currently sleeping. No signs of distress noted. Patient received breakfast tray. Maintained on 15 minute checks and observation by security camera for safety.

## 2015-04-24 NOTE — ED Notes (Signed)
Patient resting quietly in room. No noted distress or abnormal behaviors noted. Will continue 15 minute checks and observation by security camera for safety. 

## 2015-04-24 NOTE — ED Notes (Signed)
BEHAVIORAL HEALTH ROUNDING  Patient sleeping: No.  Patient alert and oriented: yes  Behavior appropriate: Yes. ; If no, describe:  Nutrition and fluids offered: Yes  Toileting and hygiene offered: Yes  Sitter present: not applicable, Q 15 min safety rounds and observation via security camera. Law enforcement present: Yes ODS  

## 2015-04-24 NOTE — ED Notes (Signed)
Patient states that he feels "fine" this morning. He has no complaints or concerns at this time, denies SI/HI/AVH and pain. Patient affect is bright and pleasant, although he says he continually feels depressed. No signs of agitation and patient is calm and cooperative. Maintained on 15 minute checks and observation by security camera for safety.

## 2015-04-24 NOTE — ED Notes (Signed)
IVC/ Patient on [facility] wait list at this time 

## 2015-04-24 NOTE — ED Notes (Signed)
Patient asleep in room. No noted distress or abnormal behavior. Will continue 15 minute checks and observation by security cameras for safety. 

## 2015-04-24 NOTE — ED Notes (Signed)
Patient currently in room resting. No signs of distress noted at this time. Maintained on 15 minute checks and observation by security camera for safety.  

## 2015-04-24 NOTE — ED Notes (Signed)
Patient came to the nurse and requested to have ativan PRN. Nurse noticed that patient's hands were trembling more than at baseline and that patient was beginning to redden in the face. Nurse administered ativan to patient, will continue to monitor for increased agitation and will determine reason for increased agitation once patient wishes to speak and becomes more calm.

## 2015-04-24 NOTE — ED Provider Notes (Signed)
-----------------------------------------   6:39 AM on 04/24/2015 -----------------------------------------   Blood pressure 135/86, pulse 75, temperature 98.3 F (36.8 C), temperature source Oral, resp. rate 20, height  (1.753 m), weight 270 lb (122.471 kg), SpO2 98 %.  The patient had no acute events since last update.  Calm and cooperative at this time.  Disposition is pending per Psychiatry/Behavioral Medicine team recommendations.     Irean Hong, MD 04/24/15 [phone]

## 2015-04-24 NOTE — Progress Notes (Signed)
TTS has been notified that that pt has become agitated and has inquired on several occasions about his treatment plan and referral process. Per staffs' report the pt has requested an updated in regards to placement. TTS has spoken with the pt and provided him with updated information in regards to the referral process. Pt was educated on the average length of stay for various inpt. facilities. Pt was informed that it is very likely that he will be reviewed for placement at [facility]. Pt presents as frustrated and guarded although he did process with staff and remained cooperative throughout. Pt has requested to arrange a time to allow his parents to come to the facility and sign legal guardianship documentation. Pt states that he would prefer to bring an outside notary to accomplish this task. Pt nurse was made aware of his requests. Pt was informed that TTS will follow-up with him to inform him of how we would be able to assist with this transition.   04/24/2015 Cheryl Flash, MS, NCC, LPCA Therapeutic Triage Specialist

## 2015-04-24 NOTE — ED Notes (Signed)
Patient discussed with nurse plans to allow parents to receive medical power of attorney. Nurse informed patient that an outside notary should be used to fill out the form during visitation times. Will continue to offer therapeutic support to patient.

## 2015-04-24 NOTE — ED Notes (Signed)
ENVIRONMENTAL ASSESSMENT  Potentially harmful objects out of patient reach: Yes.  Personal belongings secured: Yes.  Patient dressed in hospital provided attire only: Yes.  Plastic bags out of patient reach: Yes.  Patient care equipment (cords, cables, call bells, lines, and drains) shortened, removed, or accounted for: Yes.  Equipment and supplies removed from bottom of stretcher: Yes.  Potentially toxic materials out of patient reach: Yes.  Sharps container removed or out of patient reach: Yes.   BEHAVIORAL HEALTH ROUNDING  Patient sleeping: No.  Patient alert and oriented: yes  Behavior appropriate: Yes. ; If no, describe:  Nutrition and fluids offered: Yes  Toileting and hygiene offered: Yes  Sitter present: not applicable, Q 15 min safety rounds and observation via security camera. Law enforcement present: Yes ODS   ED BHU PLACEMENT JUSTIFICATION  Is the patient under IVC or is there intent for IVC: Yes.  Is the patient medically cleared: Yes.  Is there vacancy in the ED BHU: Yes.  Is the population mix appropriate for patient: Yes.  Is the patient awaiting placement in inpatient or outpatient setting: Yes.  Has the patient had a psychiatric consult: Yes.  Survey of unit performed for contraband, proper placement and condition of furniture, tampering with fixtures in bathroom, shower, and each patient room: Yes. ; Findings: All clear  APPEARANCE/BEHAVIOR  calm, cooperative and adequate rapport can be established  NEURO ASSESSMENT  Orientation: time, place and person  Hallucinations: No.None noted (Hallucinations)  Speech: Normal  Gait: normal  RESPIRATORY ASSESSMENT  WNL  CARDIOVASCULAR ASSESSMENT  WNL  GASTROINTESTINAL ASSESSMENT  WNL  EXTREMITIES  WNL  PLAN OF CARE  Provide calm/safe environment. Vital signs assessed twice daily. ED BHU Assessment once each 12-hour shift. Collaborate with intake RN daily or as condition indicates. Assure the ED provider has rounded  once each shift. Provide and encourage hygiene. Provide redirection as needed. Assess for escalating behavior; address immediately and inform ED provider.  Assess family dynamic and appropriateness for visitation as needed: Yes. ; If necessary, describe findings:  Educate the patient/family about BHU procedures/visitation: Yes. ; If necessary, describe findings: Pt is calm and cooperative at this time. Pt understanding and accepting of unit procedures/rules. Will continue to monitor with Q 15 min safety rounds and observation via security camera.     

## 2015-04-24 NOTE — ED Notes (Signed)
Patient came up to the nurse's station and spoke with staff saying that he changed his mind and wished for the parents to be power of attorney. Staff agreed to document this information.

## 2015-04-24 NOTE — ED Notes (Signed)
Patient in room currently eating lunch. No signs of distress at this time. Maintained on 15 minute checks and observation by security camera for safety.

## 2015-04-25 DIAGNOSIS — F3164 Bipolar disorder, current episode mixed, severe, with psychotic features: Secondary | ICD-10-CM | POA: Diagnosis not present

## 2015-04-25 NOTE — ED Notes (Signed)
Patient asleep in room. No noted distress or abnormal behavior. Will continue 15 minute checks and observation by security cameras for safety. 
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
## 2015-04-25 NOTE — ED Notes (Signed)
Spoke to patient's mother. Mother wants her son to go to the [facility] in [place]. Mother stated their lawyer was going to speak to the DA to determine if this was an option for her son.  RN has verbal permission to inform Dr. Toni Amend.

## 2015-04-25 NOTE — ED Notes (Signed)
Pt. Noted sleeping in room. No complaints or concerns voiced. No distress or abnormal behavior noted. Will continue to monitor with security cameras. Q 15 minute rounds continue. 

## 2015-04-25 NOTE — ED Notes (Signed)
Pt. Noted in room. No complaints or concerns voiced. No distress or abnormal behavior noted. Will continue to monitor with security cameras. Q 15 minute rounds continue. 

## 2015-04-25 NOTE — ED Notes (Signed)
Pt denies SI/HI and AVH. Denies pain. Pt was in good spirits. No complaints voiced. No distress noted. Pt stated his parents gave him good news this morning: He was accepted by a clinic in [place]. Pt wants to speak with Dr. Toni Amend when he arrives. Maintained on 15 minute checks and observation by security camera for safety.

## 2015-04-25 NOTE — ED Notes (Signed)
Patient resting quietly in room. No noted distress or abnormal behaviors noted. Will continue 15 minute checks and observation by security camera for safety. 

## 2015-04-25 NOTE — ED Notes (Signed)
Pt taking a shower 

## 2015-04-25 NOTE — ED Notes (Signed)
Pt. Noted in day room. No complaints or concerns voiced. No distress or abnormal behavior noted. Will continue to monitor with security cameras. Q 15 minute rounds continue. 

## 2015-04-25 NOTE — ED Provider Notes (Signed)
-----------------------------------------   6:27 AM on 04/25/2015 -----------------------------------------   Blood pressure 126/78, pulse 100, temperature 98.5 F (36.9 C), temperature source Oral, resp. rate 14, height  (1.753 m), weight 270 lb (122.471 kg), SpO2 96 %.  The patient had no acute events since last update.  Calm and cooperative at this time.  Disposition is pending per Psychiatry/Behavioral Medicine team recommendations.     Irean Hong, MD 04/25/15 [phone]

## 2015-04-25 NOTE — ED Notes (Signed)
Pt watching movie in dayroom. Requested PO Ativan. Medication administered. No further complaints. No distress noted. Maintained on 15 minute checks and observation by security camera for safety.

## 2015-04-25 NOTE — BH Assessment (Signed)
Confirmed with [facility] ([phone]), patient remain on their Waitlist.

## 2015-04-25 NOTE — ED Notes (Signed)
Pt visiting with mother.  No distress noted. Pt calm. Maintained on 15 minute checks and observation by security camera for safety.

## 2015-04-25 NOTE — ED Notes (Signed)
Pt speaking on phone in dayroom. Pt remains calm. No complaints. No distress noted. Maintained on 15 minute checks and observation by security camera for safety.

## 2015-04-25 NOTE — BH Assessment (Signed)
Patient requested to talk with TTS. Writer spoke with him in regards to him signing papers for his parents to be his Power of attorney. Writer advised the patient, he would request a chaplain consult on tomorrow (04/26/2015). Patient states, his parents called the hospital today (04/25/2015) and they were advised, they would have to talk with the Hospitals Lawyer. Writer told the patient he was unsure as to why they were told that, but he would relay the message to the chaplain because TTS doesn't complete Power of Attorney forms.

## 2015-04-25 NOTE — ED Notes (Signed)
Report received from Amy RN. Pt. Alert and oriented in no distress denies SI, HI, AVH and pain.  Pt. Instructed to come to me with problems or concerns.Will continue to monitor for safety via security cameras and Q 15 minute checks. 

## 2015-04-25 NOTE — ED Notes (Signed)
Pt asked to use phone to call his mother after speaking to Dr. Toni Amend. Pt remained calm. Minutes later Wenda Low (pt's mother) called to speak with Dr. Toni Amend. Dr. Toni Amend will return her call. Maintained on 15 minute checks and observation by security camera for safety.

## 2015-04-26 LAB — VALPROIC ACID LEVEL: Valproic Acid Lvl: 69 ug/mL (ref 50.0–100.0)

## 2015-04-26 MED ORDER — ASENAPINE MALEATE 5 MG SL SUBL
SUBLINGUAL_TABLET | SUBLINGUAL | Status: AC
  Administered 2015-04-26: 5 mg via SUBLINGUAL
  Filled 2015-04-26: qty 1

## 2015-04-26 NOTE — ED Notes (Signed)
Pt received breakfast tray 

## 2015-04-26 NOTE — ED Notes (Signed)
Pt. Noted sleeping in room. No complaints or concerns voiced. No distress or abnormal behavior noted. Will continue to monitor with security cameras. Q 15 minute rounds continue. 

## 2015-04-26 NOTE — Progress Notes (Signed)
Patient discussed with Psychiatry Medical Director and with Dr. Di Kindle in support of pursuing option for patient to be released to law enforcement with ankle bracelet monitor.  Jackquline Denmark, Director of Security for [employer] spoke with [employer]. Harle Stanford at [employer] [phone]) this afternoon and it has been agreed that so that Mr. Axel Filler could coordinate this plan with them.  Mr. Axel Filler reports that Sgt. Harle Stanford has ordered ankle monitor and expects it to arrive between now and Friday, 2/3.  When it arrives Sgt. Harle Stanford will place it on patient and transport him to his parents' home.  He (Sgt. Harle Stanford) is also encouraging patient's family to explore options for private psychiatric treatment for him at a later date.    Dr. Di Kindle, nursing staff, and Security officer in ED updated.    Rae Halsted, ACSW, Designer, industrial/product, Clinical Social Work [phone]

## 2015-04-26 NOTE — BH Assessment (Signed)
Confirmed with [facility] ([phone]), patient remain on their Waitlist.

## 2015-04-26 NOTE — ED Notes (Signed)
Patient resting quietly in room. No noted distress or abnormal behaviors noted. Will continue 15 minute checks and observation by security camera for safety. 
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
## 2015-04-26 NOTE — ED Notes (Signed)
Patient asleep in room. No noted distress or abnormal behavior. Will continue 15 minute checks and observation by security cameras for safety. 

## 2015-04-26 NOTE — ED Notes (Signed)
Pt cooperative. Denies SI/HI, AVH and pain. Pt stated he had a good night. No complaints voiced. No distress noted. Maintained on 15 minute checks and observation by security camera for safety.

## 2015-04-26 NOTE — ED Provider Notes (Signed)
-----------------------------------------   6:47 AM on 04/26/2015 -----------------------------------------   Blood pressure 122/70, pulse 81, temperature 98.4 F (36.9 C), temperature source Oral, resp. rate 16, height  (1.753 m), weight 270 lb (122.471 kg), SpO2 98 %.  The patient had no acute events since last update.  Calm and cooperative at this time.   The patient has been referred to [facility] and is currently on the waiting list for acceptance.     Rebecka Apley, MD 04/26/15 [phone]

## 2015-04-26 NOTE — ED Notes (Signed)
Patient making phone call. No concerns voiced. No distress noted. Pt calm. Maintained on 15 minute checks and observation by security camera for safety.

## 2015-04-26 NOTE — ED Notes (Signed)
Pt speaking with psychiatrist. RN informed psychiatrist of pt's concerns regarding a second opinion.  Maintained on 15 minute checks and observation by security camera for safety.

## 2015-04-26 NOTE — ED Notes (Signed)
Report to Amy B. RN. 

## 2015-04-26 NOTE — ED Notes (Signed)
With patient permission, his black wallet and several paper documents were given to mother to take home.

## 2015-04-26 NOTE — ED Notes (Signed)
Pt watching tv in room. No complaints. No distress. Remains calm and cooperative. Maintained on 15 minute checks and observation by security camera for safety.

## 2015-04-26 NOTE — ED Notes (Signed)
Pt visiting with grandmother. Pt has remained calm. Pt requesting to speak to [facility], TTS, regarding a second opinion.  RN informed Jerilynn Som. No distress noted. Maintained on 15 minute checks and observation by security camera for safety.

## 2015-04-26 NOTE — ED Notes (Addendum)
Pt resting comfortably in bed watching TV, no needs expressed at this time. Pt reports was feeling anxious earlier today and was given medication to assist to calm patient down. Pt verbalized understanding to contract for safety. Will continue 15 minutes monitoring. NAD noted.

## 2015-04-26 NOTE — BH Assessment (Signed)
Per the request of Psych MD (Dr. Toni Amend), writer called patient's mother [phone]) to inquire about the programs & hospitals they had mentioned to ER Staff, they want to take the patient to. Per the mother, they were told about [facility] [address] and [place]. They also set up an appointment with the "[facility]."  It's a three day, outpatient clinic. The center they arranged to go to was in [place], [place]. Mother has cancelled the appointment, due to the patient being in the ER. Per the mother, the first two days, he will undergo brain scans. On the third day, he will talk with one of their psychiatrist and they will share with him the results of the scans and give him "an accurate diagnosis." After that the discussion, they will develop a treatment plan and will hopefully see a therapist in [place], [place], who is apart of the "[facility].  Writer collected the [facility] contact information and made attempts to contact them.  Per the Mother, Vicente Serene [phone]) was their assigned case worker. She's based out of [facility]. Writer left a HIPPA complaint message, requesting a return phone call. Patient information or any indicators of him, were not on the message. Writer's message asked for more information about the programs and treatment options they offer.  Writer contacted the main toll free number [phone]) for information. Writer was advised to look at their website for information.   [email or web address] Writer did not disclosed any of the patient's course of treatment with the patient's parents, due to the patient being over 6 years old and no Release of information being on file. During the phone call, the mother had this Clinical research associate on speaker phone and at times someone spoke up and shared additional information about the "[facility]." Mother was informed, the information she provided will be forwarded to the Psychiatric Team.

## 2015-04-26 NOTE — ED Notes (Signed)
Pt requested an Ativan. Medication administered as ordered. No further concerns. No distress. Maintained on 15 minute checks and observation by security camera for safety.Maintained on 15 minute checks and observation by security camera for safety.

## 2015-04-26 NOTE — ED Notes (Signed)
Pt compliant with medications and blood draw. Pt states he is still feeling good this morning. Made a phone call, pt remained calm. No complaints. No distress noted. Maintained on 15 minute checks and observation by security camera for safety.

## 2015-04-26 NOTE — ED Notes (Signed)
Pt provided with snack; graham crackers, peanut butter, and ginger ale to drink. Pt denies other needs at this time. Will continue to monitor.

## 2015-04-27 DIAGNOSIS — F3164 Bipolar disorder, current episode mixed, severe, with psychotic features: Secondary | ICD-10-CM | POA: Diagnosis not present

## 2015-04-27 LAB — GLUCOSE, CAPILLARY: GLUCOSE-CAPILLARY: 97 mg/dL (ref 65–99)

## 2015-04-27 MED ORDER — MELOXICAM 7.5 MG PO TABS
ORAL_TABLET | ORAL | Status: AC
  Filled 2015-04-27: qty 2

## 2015-04-27 MED ORDER — ASENAPINE MALEATE 5 MG SL SUBL
SUBLINGUAL_TABLET | SUBLINGUAL | Status: AC
  Filled 2015-04-27: qty 1

## 2015-04-27 MED ORDER — DIVALPROEX SODIUM ER 500 MG PO TB24
ORAL_TABLET | ORAL | Status: AC
  Administered 2015-04-27: 1000 mg via ORAL
  Filled 2015-04-27: qty 2

## 2015-04-27 NOTE — ED Notes (Signed)
Pt. Noted in room. No complaints or concerns voiced. No distress or abnormal behavior noted. Will continue to monitor with security cameras. Q 15 minute rounds continue..both mother and father visited and visit seemed to go well
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
## 2015-04-27 NOTE — ED Notes (Signed)
Dr Demetrius Charity with pt

## 2015-04-27 NOTE — ED Notes (Signed)
Pt. Noted in room. No complaints or concerns voiced. No distress or abnormal behavior noted. Will continue to monitor with security cameras. Q 15 minute rounds continue.,on phone watching tv in day room
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
## 2015-04-27 NOTE — ED Notes (Signed)
Pt awake momentarily for blood sugar check and breakast but has now gone back to sleep , he is no distress

## 2015-04-27 NOTE — BH Assessment (Signed)
Per the request of Psych MD (Dr. Toni Amend), writer called patient's parents [phone]) to check on the arrangements and status of the ankle monitoring bracelet being put in place. Per the report of his parent's, they the way the judge wrote the order and the technology of it, it wasn't ready at this time. Parents have been in communication with the company who will be providing the ankle monitor and they will know something by tomorrow (04/28/2015). At this point, it is unclear when the monitor will be ready. Parents state, they will like for it to be tomorrow but they were unsure as to when it will be.  Per the report of the parent's, [employer]," is the name of the company.  They were selected by the "DA." The contact person they have been in communication with, Hassie Bruce [phone] ext. [phone]).

## 2015-04-27 NOTE — ED Notes (Addendum)
Pt. Noted in room. No complaints or concerns voiced. No distress or abnormal behavior noted. Will continue to monitor with security cameras. Q 15 minute rounds continue.given lunch and he took a shower

## 2015-04-27 NOTE — ED Notes (Signed)
Pt is awake in bed this evening watching TV. Pt mood is appropriate and his affect is bright. Pt states that he is eager to go home and is satisfied with his treatment plan. Pt denies SI/HI and AVH, although he does c/o some acid reflux. Milk provided and will continue to monitor. Pt is pleasant and cooperative with staff. 15 minute checks ongoing for safety.

## 2015-04-27 NOTE — BH Assessment (Signed)
Confirmed with [facility] ([facility]-[phone]), patient remain on their Waitlist.

## 2015-04-27 NOTE — ED Notes (Signed)
Pt. Noted in room. No complaints or concerns voiced. No distress or abnormal behavior noted. Will continue to monitor with security cameras. Q 15 minute rounds continue.,pt remains asleep ,resps easy in no distress

## 2015-04-27 NOTE — ED Notes (Signed)
Pt awake now asking to use the phone, he has no c/o he is aware is is waiting  for a legal ankle bractlet to be applied

## 2015-04-27 NOTE — ED Notes (Signed)
Pt noted sleeping in room. NAD noticed. Will continue 15 minute observations and security monitoring.
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
## 2015-04-27 NOTE — Consult Note (Signed)
  Chase Hampton is a 25 year old male with a history of depression, anxiety, traumatic brain injury, mood instability, suicide attempt, and homicidal threats who was brought to the hospital after he was released from jail on the bond. He was IVC from jail and per judges order the patient was to be held on psychiatry ward or released to the community with a safety bracelet. Up until now we did not realize that the bracelet can be placed in the hospital before the patient is discharged to home.   I was asked to evaluate if the patient is safe to be discharged under condition determined by the judge.   I interviewed the patient personally in the emergency room today. The patient denies any thoughts intentions or plans to hurt himself or others. He is forward thinking and optimistic about the future. Full consult note to follow.  PLAN:  1. In my opinion the patient no longer meets criteria for involuntary inpatient psychiatric commitment. I support release from IVC as long as the bracelet is placed prior to discharge.  2. I concur with current medication regimen that includes Depakote, Saphris, and as needed Zyprexa which the patient finds helpful. I support the discontinuation of Prozac.  3. The patient will follow up with his primary psychiatrist in the community, Dr. Mila Palmer at [facility].  4. The patient has appointment for next week with [facility] in [place] to diagnose and treat presumed traumatic brain injury. The patient agrees with the plan and is rather hopeful about possible treatment.

## 2015-04-28 MED ORDER — MELOXICAM 7.5 MG PO TABS
ORAL_TABLET | ORAL | Status: AC
  Filled 2015-04-28: qty 2

## 2015-04-28 MED ORDER — DIVALPROEX SODIUM 500 MG PO DR TAB
DELAYED_RELEASE_TABLET | ORAL | Status: AC
  Administered 2015-04-28: 1000 mg
  Filled 2015-04-28: qty 2

## 2015-04-28 MED ORDER — ASENAPINE MALEATE 5 MG SL SUBL
SUBLINGUAL_TABLET | SUBLINGUAL | Status: AC
  Administered 2015-04-28: 5 mg via SUBLINGUAL
  Filled 2015-04-28: qty 1

## 2015-04-28 MED ORDER — LORAZEPAM 1 MG PO TABS
ORAL_TABLET | ORAL | Status: AC
  Administered 2015-04-28: 1 mg via ORAL
  Filled 2015-04-28: qty 1

## 2015-04-28 NOTE — ED Notes (Signed)
Patient is talking on phone, no s/s of distress.

## 2015-04-28 NOTE — ED Provider Notes (Signed)
-----------------------------------------   6:07 AM on 04/28/2015 -----------------------------------------   Blood pressure 128/77, pulse 96, temperature 98.4 F (36.9 C), temperature source Oral, resp. rate 20, height  (1.753 m), weight 122.471 kg, SpO2 99 %.  The patient had no acute events since last update.  Calm and cooperative at this time.  Dr. Demetrius Charity and Dr. Toni Amend both evaluated the patient yesterday and decided that he is okay to be discharged and follow-up as an outpatient.  They have rescinded his involuntary commitment.  However, he needs to have an ankle bracelet placed before he may be discharged.  The family is responsible for the ankle bracelet, and is expected to happen today.   Loleta Rose, MD 04/28/15 [phone]

## 2015-04-28 NOTE — Progress Notes (Addendum)
Per the request of the Director, Clinical research associate called patient's parents [phone]) to check on the arrangements and status of the ankle monitoring bracelet being put in place. No answer, TTS will reach out again shortly to attempt to get an update.   Pts mother has return TTS call and given permission for TTS/Social Work to intervene and engaged on the patients behave in attempts to expedite that pts discharge as he has become increasingly agitated and aggressive. TTS/Social Work will contact the DA and that company that is responsible for providing the ankle tracking device. Nurse Toniann Fail has agreed to inform the pt of these plans. Pt mother states that the monitoring company has stated that it could be as late as next tuesday before arrangments can be made to provide service to the pt.   04/28/2015 Cheryl Flash, MS, NCC, LPCA Therapeutic Triage Specialist

## 2015-04-28 NOTE — ED Notes (Signed)
Patient is safe, no s/s of distress, denies Si/HI, patient is calm, watching tv, patient states that he hopes to get out of here soon, 15 min. Checks maintained and security cameras.

## 2015-04-28 NOTE — ED Notes (Signed)
Patient is alert and oriented, no s/s of distress. Watching tv, denies SI/HI patient with q 15 min. Checks and camera monitoring.

## 2015-04-28 NOTE — ED Notes (Signed)
Snack and beverage given. 

## 2015-04-28 NOTE — ED Notes (Signed)
Pt. Noted in room awake watching the tv. No complaints or concerns voiced. No distress or abnormal behavior noted. Will continue to monitor with security cameras. Q 15 minute rounds continue. 
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
## 2015-04-28 NOTE — ED Notes (Signed)
Nurse gave patient breakfast tray, patient without complaints, states that He slept good. Patient denies SI/HI/ avh, q 15 min checks and camera monitoring.

## 2015-04-28 NOTE — Discharge Instructions (Signed)
You have been seen in the Emergency Department (ED) today for a psychiatric complaint.  You have been evaluated by psychiatry and we believe you are safe to be discharged from the hospital.   ° °Please return to the ED immediately if you have ANY thoughts of hurting yourself or anyone else, so that we may help you. ° °Please avoid alcohol and drug use. ° °Follow up with your doctor and/or therapist as soon as possible regarding today's ED visit.   Please follow up any other recommendations and clinic appointments provided by the psychiatry team that saw you in the Emergency Department. ° ° °Bipolar Disorder °Bipolar disorder is a mental illness. The term bipolar disorder actually is used to describe a group of disorders that all share varying degrees of emotional highs and lows that can interfere with daily functioning, such as work, school, or relationships. Bipolar disorder also can lead to drug abuse, hospitalization, and suicide. °The emotional highs of bipolar disorder are periods of elation or irritability and high energy. These highs can range from a mild form (hypomania) to a severe form (mania). People experiencing episodes of hypomania may appear energetic, excitable, and highly productive. People experiencing mania may behave impulsively or erratically. They often make poor decisions. They may have difficulty sleeping. The most severe episodes of mania can involve having very distorted beliefs or perceptions about the world and seeing or hearing things that are not real (psychotic delusions and hallucinations).  °The emotional lows of bipolar disorder (depression) also can range from mild to severe. Severe episodes of bipolar depression can involve psychotic delusions and hallucinations. °Sometimes people with bipolar disorder experience a state of mixed mood. Symptoms of hypomania or mania and depression are both present during this mixed-mood episode. °SIGNS AND SYMPTOMS °There are signs and symptoms of  the episodes of hypomania and mania as well as the episodes of depression. The signs and symptoms of hypomania and mania are similar but vary in severity. They include: °· Inflated self-esteem or feeling of increased self-confidence. °· Decreased need for sleep. °· Unusual talkativeness (rapid or pressured speech) or the feeling of a need to keep talking. °· Sensation of racing thoughts or constant talking, with quick shifts between topics that may or may not be related (flight of ideas). °· Decreased ability to focus or concentrate. °· Increased purposeful activity, such as work, studies, or social activity, or nonproductive activity, such as pacing, squirming and fidgeting, or finger and toe tapping. °· Impulsive behavior and use of poor judgment, resulting in high-risk activities, such as having unprotected sex or spending excessive amounts of money. °Signs and symptoms of depression include the following:  °· Feelings of sadness, hopelessness, or helplessness. °· Frequent or uncontrollable episodes of crying. °· Lack of feeling anything or caring about anything. °· Difficulty sleeping or sleeping too much.  °· Inability to enjoy the things you used to enjoy.   °· Desire to be alone all the time.   °· Feelings of guilt or worthlessness.  °· Lack of energy or motivation.   °· Difficulty concentrating, remembering, or making decisions.  °· Change in appetite or weight beyond normal fluctuations. °· Thoughts of death or the desire to harm yourself. °DIAGNOSIS  °Bipolar disorder is diagnosed through an assessment by your caregiver. Your caregiver will ask questions about your emotional episodes. There are two main types of bipolar disorder. People with type I bipolar disorder have manic episodes with or without depressive episodes. People with type II bipolar disorder have hypomanic episodes and major depressive   episodes, which are more serious than mild depression. The type of bipolar disorder you have can make an  important difference in how your illness is monitored and treated. Your caregiver may ask questions about your medical history and use of alcohol or drugs, including prescription medication. Certain medical conditions and substances also can cause emotional highs and lows that resemble bipolar disorder (secondary bipolar disorder).  TREATMENT  Bipolar disorder is a long-term illness. It is best controlled with continuous treatment rather than treatment only when symptoms occur. The following treatments can be prescribed for bipolar disorders:  Medication--Medication can be prescribed by a doctor that is an expert in treating mental disorders (psychiatrists). Medications called mood stabilizers are usually prescribed to help control the illness. Other medications are sometimes added if symptoms of mania, depression, or psychotic delusions and hallucinations occur despite the use of a mood stabilizer.  Talk therapy--Some forms of talk therapy are helpful in providing support, education, and guidance. A combination of medication and talk therapy is best for managing the disorder over time. A procedure in which electricity is applied to your brain through your scalp (electroconvulsive therapy) is used in cases of severe mania when medication and talk therapy do not work or work too slowly.   This information is not intended to replace advice given to you by your health care provider. Make sure you discuss any questions you have with your health care provider.   Document Released: 06/18/2000 Document Revised: 04/02/2014 Document Reviewed: 04/07/2012 Elsevier Interactive Patient Education 2016 [employer].  Suicidal Feelings: How to Help Yourself Suicide is the taking of one's own life. If you feel as though life is getting too tough to handle and are thinking about suicide, get help right away. To get help:  Call your local emergency services (911 in the [place]  Call a suicide hotline to speak with  a trained counselor who understands how you are feeling. The following is a list of suicide hotlines in the [place]. For a list of hotlines in [place], visit InkDistributor.it.  1-800-273-TALK 276-426-8923).  1-800-SUICIDE 517-814-6520).  423-108-9003. This is a hotline for Spanish speakers.  4-696-295-2WUX 4171347865). This is a hotline for TTY users.  1-866-4-U-TREVOR 3085133575). This is a hotline for lesbian, gay, bisexual, transgender, or questioning youth.  Contact a crisis center or a local suicide prevention center. To find a crisis center or suicide prevention center:  Call your local hospital, clinic, community service organization, mental health center, social service provider, or health department. Ask for assistance in connecting to a crisis center.  Visit https://www.patel-king.com/ for a list of crisis centers in the [place], or visit www.suicideprevention.ca/thinking-about-suicide/find-a-crisis-centre for a list of centers in [place].  Visit the following websites:  National Suicide Prevention Lifeline: www.suicidepreventionlifeline.org  Hopeline: www.hopeline.com  [employer] for Suicide Prevention: https://www.ayers.com/  [employer] (for lesbian, gay, bisexual, transgender, or questioning youth): www.thetrevorproject.org HOW CAN I HELP MYSELF FEEL BETTER?  Promise yourself that you will not do anything drastic when you have suicidal feelings. Remember, there is hope. Many people have gotten through suicidal thoughts and feelings, and you will, too. You may have gotten through them before, and this proves that you can get through them again.  Let family, friends, teachers, or counselors know how you are feeling. Try not to isolate yourself from those who care about you. Remember, they will want to help you. Talk with someone every day, even if you do  not feel sociable. Face-to-face conversation is best.  Call  a mental health professional and see one regularly.  Visit your primary health care provider every year.  Eat a well-balanced diet, and space your meals so you eat regularly.  Get plenty of rest.  Avoid alcohol and drugs, and remove them from your home. They will only make you feel worse.  If you are thinking of taking a lot of medicine, give your medicine to someone who can give it to you one day at a time. If you are on antidepressants and are concerned you will overdose, let your health care provider know so he or she can give you safer medicines. Ask your mental health professional about the possible side effects of any medicines you are taking.  Remove weapons, poisons, knives, and anything else that could harm you from your home.  Try to stick to routines. Follow a schedule every day. Put self-care on your schedule.  Make a list of realistic goals, and cross them off when you achieve them. Accomplishments give a sense of worth.  Wait until you are feeling better before doing the things you find difficult or unpleasant.  Exercise if you are able. You will feel better if you exercise for even a half hour each day.  Go out in the sun or into nature. This will help you recover from depression faster. If you have a favorite place to walk, go there.  Do the things that have always given you pleasure. Play your favorite music, read a good book, paint a picture, play your favorite instrument, or do anything else that takes your mind off your depression if it is safe to do.  Keep your living space well lit.  When you are feeling well, write yourself a letter about tips and support that you can read when you are not feeling well.  Remember that life's difficulties can be sorted out with help. Conditions can be treated. You can work on thoughts and strategies that serve you well.   This information is not intended to replace  advice given to you by your health care provider. Make sure you discuss any questions you have with your health care provider.   Document Released: 09/16/2002 Document Revised: 04/02/2014 Document Reviewed: 07/07/2013 Elsevier Interactive Patient Education 2016 [employer].  No-harm Safety Contract A no-harm Engineer, manufacturing systems is a written or verbal agreement between you and a mental health professional to promote safety. It contains specific actions and promises you agree to. The agreement also includes instructions from the therapist or doctor. The instructions will help prevent you from harming yourself or harming others. Harm can be as mild as pinching yourself, but can increase in intensity to actions like burning or cutting yourself. The extreme level of self-harm would be committing suicide. No-harm safety contracts are also sometimes referred to as a Charity fundraiser, suicide Financial controller, no-harm agreements or decisions, or a Engineer, manufacturing systems.  REASONS FOR NO-HARM SAFETY CONTRACTS Safety contracts are just one part of an overall treatment plan to help keep you safe and free of harm. A safety contract may help to relieve anxiety, restore a sense of control, state clearly the alternatives to harm or suicide, and give you and your therapist or doctor a gauge for how you are doing in between visits. Many factors impact the decision to use a no-harm safety contract and its effectiveness. A proper overall treatment plan and evaluation and good patient understanding are the keys to good outcomes. CONTRACT ELEMENTS  A contract can range from simple to complex.  They include all or some of the following:  Action statements. These are statements you agree to do or not do. Example: If I feel my life is becoming too difficult, I agree to do the following so there is no harm to myself or others:  Talk with family or friends.  Rid myself of all things that I could use to harm myself.  Do an  activity I enjoy or have enjoyed in the recent past. Coping strategies. These are ways to think and feel that decrease stress, such as:  Use of affirmations or positive statements about self.  Good self-care, including improved grooming, and healthy eating, and healthy sleeping patterns.  Increase physical exercise.  Increase social involvement.  Focus on positive aspects of life. Crisis management. This would include what to do if there was trouble following the contract or an urge to harm. This might include notifying family or your therapist of suicidal thoughts. Be open and honest about suicidal urges. To prevent a crisis, do the following:  List reasons to reach out for support.  Keep contact numbers and available hours handy. Treatment goals. These are goals would include no suicidal thoughts, improved mood, and feelings of hopefulness. Listed responsibilities of different people involved in care. This could include family members. A family member may agree to remove firearms or other lethal weapons/substances from your ease of access. A timeline. A timeline can be in place from one therapy session to the next session. HOME CARE INSTRUCTIONS   Follow your no-harm safety contract.  Contact your therapist and/or doctor if you have any questions or concerns. MAKE SURE YOU:   Understand these instructions.  Will watch your condition. Noticing any mood changes or suicidal urges.  Will get help right away if you are not doing well or get worse.   This information is not intended to replace advice given to you by your health care provider. Make sure you discuss any questions you have with your health care provider.   Document Released: 08/30/2009 Document Revised: 04/02/2014 Document Reviewed: 08/30/2009 Elsevier Interactive Patient Education [employer]

## 2015-04-28 NOTE — ED Notes (Signed)
Report received from Wendy L., RN. Pt. Alert and oriented in no distress denies SI, HI, AVH and pain.  Pt. Instructed to come to me with problems or concerns.Will continue to monitor for safety via security cameras and Q 15 minute checks. 

## 2015-04-28 NOTE — Progress Notes (Signed)
TTS contacted the representaive for "[employer],"  the company designated to provide the pt with a tracking bractlet prior to discharge.Representative Hassie Bruce [phone]) has communicated that it could be as late as next Thursday 05/05/15 before the device will be given to the pt. Mr Randa Evens states that this is a complex process and that it will take an extended amount of time to update court documentation and inform all parties involved. TTS explained that the hospital has approved the pt for discharged and inquired as to if there would be anything that we could do to expedite the process. TSS was referred to the Assistant DA, Delila Spence. TTS has attempted to contact Mrs. Everest @ [phone], No answer.   04/28/2015 Cheryl Flash, MS, NCC, LPCA Therapeutic Triage Specialist

## 2015-04-28 NOTE — ED Notes (Signed)
Patient is awake, patient is oriented, denies Si/Hi/avh, Patient without s/s of distress. Camera monitoring, q15 min.

## 2015-04-28 NOTE — Consult Note (Signed)
  Psychiatry: Follow-up for this 25 year old man in the emergency room. Patient seen. Chart reviewed. Patient has no new complaints today. States that his mood is feeling fine. Denies any suicidal ideation. Not displaying any psychotic or dangerous behaviors. No new physical complaints. Glucose is normal. Vital signs stable.  No change to clinical situation or legal situation since yesterday. Plan continues to be that as soon as we can get the ankle bracelet placed by police we will release him from the emergency room with his parents. For now no change to medication. Patient understands the plan.

## 2015-04-28 NOTE — ED Notes (Signed)
Patient had mom and dad to visit back to back,nurse sitter at door, Patient did become elevated with His mom, speaking louder and louder, patient states " I'm done and you are not doing anything about this" Mom states that she is calling daily to try to get something done, MOm left and patient did hit wall with fist, patient did become easily redirected and did not need anything for agitation. Patient started watching tv after nurse spoke with him.

## 2015-04-29 ENCOUNTER — Inpatient Hospital Stay
Admit: 2015-04-29 | Discharge: 2015-05-04 | DRG: 885 | Disposition: A | Discharge status: Other Acute Inpatient Hospital | Payer: BLUE CROSS/BLUE SHIELD | Source: Intra-hospital | Attending: Psychiatry | Admitting: Psychiatry

## 2015-04-29 DIAGNOSIS — E663 Overweight: Secondary | ICD-10-CM | POA: Diagnosis present

## 2015-04-29 DIAGNOSIS — F602 Antisocial personality disorder: Secondary | ICD-10-CM | POA: Diagnosis present

## 2015-04-29 DIAGNOSIS — Z6841 Body Mass Index (BMI) 40.0 and over, adult: Secondary | ICD-10-CM | POA: Diagnosis not present

## 2015-04-29 DIAGNOSIS — Z833 Family history of diabetes mellitus: Secondary | ICD-10-CM | POA: Diagnosis not present

## 2015-04-29 DIAGNOSIS — R45850 Homicidal ideations: Secondary | ICD-10-CM | POA: Diagnosis present

## 2015-04-29 DIAGNOSIS — R451 Restlessness and agitation: Secondary | ICD-10-CM | POA: Diagnosis present

## 2015-04-29 DIAGNOSIS — F919 Conduct disorder, unspecified: Secondary | ICD-10-CM | POA: Diagnosis not present

## 2015-04-29 DIAGNOSIS — E8881 Metabolic syndrome: Secondary | ICD-10-CM | POA: Diagnosis present

## 2015-04-29 DIAGNOSIS — Z8249 Family history of ischemic heart disease and other diseases of the circulatory system: Secondary | ICD-10-CM

## 2015-04-29 DIAGNOSIS — R45851 Suicidal ideations: Secondary | ICD-10-CM | POA: Diagnosis present

## 2015-04-29 DIAGNOSIS — Z888 Allergy status to other drugs, medicaments and biological substances status: Secondary | ICD-10-CM | POA: Diagnosis not present

## 2015-04-29 DIAGNOSIS — S069X9A Unspecified intracranial injury with loss of consciousness of unspecified duration, initial encounter: Secondary | ICD-10-CM

## 2015-04-29 DIAGNOSIS — Z87820 Personal history of traumatic brain injury: Secondary | ICD-10-CM | POA: Diagnosis not present

## 2015-04-29 DIAGNOSIS — Z79899 Other long term (current) drug therapy: Secondary | ICD-10-CM

## 2015-04-29 DIAGNOSIS — K219 Gastro-esophageal reflux disease without esophagitis: Secondary | ICD-10-CM | POA: Diagnosis present

## 2015-04-29 DIAGNOSIS — F410 Panic disorder [episodic paroxysmal anxiety] without agoraphobia: Secondary | ICD-10-CM | POA: Diagnosis present

## 2015-04-29 DIAGNOSIS — Z818 Family history of other mental and behavioral disorders: Secondary | ICD-10-CM | POA: Diagnosis not present

## 2015-04-29 DIAGNOSIS — G4700 Insomnia, unspecified: Secondary | ICD-10-CM | POA: Diagnosis present

## 2015-04-29 DIAGNOSIS — F331 Major depressive disorder, recurrent, moderate: Secondary | ICD-10-CM | POA: Diagnosis present

## 2015-04-29 DIAGNOSIS — S069XAA Unspecified intracranial injury with loss of consciousness status unknown, initial encounter: Secondary | ICD-10-CM

## 2015-04-29 DIAGNOSIS — Z87891 Personal history of nicotine dependence: Secondary | ICD-10-CM | POA: Diagnosis not present

## 2015-04-29 MED ORDER — ZIPRASIDONE MESYLATE 20 MG IM SOLR
INTRAMUSCULAR | Status: AC
  Administered 2015-04-29: 20 mg via INTRAMUSCULAR
  Filled 2015-04-29: qty 20

## 2015-04-29 MED ORDER — DIVALPROEX SODIUM ER 500 MG PO TB24
ORAL_TABLET | ORAL | Status: AC
  Administered 2015-04-29: 1000 mg via ORAL
  Filled 2015-04-29: qty 2

## 2015-04-29 MED ORDER — MAGNESIUM HYDROXIDE 400 MG/5ML PO SUSP: 30.0000 mL | Freq: Every day | ORAL | Status: DC | PRN

## 2015-04-29 MED ORDER — LORAZEPAM 1 MG PO TABS
1.0000 mg | ORAL_TABLET | Freq: Three times a day (TID) | ORAL | Status: DC | PRN
  Administered 2015-05-01 – 2015-05-02 (×3): 1 mg via ORAL
  Filled 2015-04-29 (×3): qty 1

## 2015-04-29 MED ORDER — LORAZEPAM 1 MG PO TABS
ORAL_TABLET | ORAL | Status: AC
  Administered 2015-04-29: 1 mg via ORAL
  Filled 2015-04-29: qty 1

## 2015-04-29 MED ORDER — ACETAMINOPHEN 325 MG PO TABS: 650.0000 mg | ORAL_TABLET | Freq: Four times a day (QID) | ORAL | Status: DC | PRN

## 2015-04-29 MED ORDER — MELOXICAM 7.5 MG PO TABS
ORAL_TABLET | ORAL | Status: AC
  Administered 2015-04-29: 15 mg via ORAL
  Filled 2015-04-29: qty 2

## 2015-04-29 MED ORDER — ZIPRASIDONE MESYLATE 20 MG IM SOLR
20.0000 mg | Freq: Once | INTRAMUSCULAR | Status: AC
  Administered 2015-04-29: 20 mg via INTRAMUSCULAR

## 2015-04-29 MED ORDER — MELOXICAM 7.5 MG PO TABS
15.0000 mg | ORAL_TABLET | Freq: Every day | ORAL | Status: DC
  Administered 2015-04-30 – 2015-05-04 (×5): 15 mg via ORAL
  Filled 2015-04-29 (×5): qty 2
  Filled 2015-04-29: qty 1
  Filled 2015-04-29: qty 2

## 2015-04-29 MED ORDER — DIVALPROEX SODIUM ER 500 MG PO TB24
1000.0000 mg | ORAL_TABLET | Freq: Every day | ORAL | Status: DC
  Administered 2015-04-30 – 2015-05-01 (×2): 1000 mg via ORAL
  Filled 2015-04-29 (×2): qty 2

## 2015-04-29 MED ORDER — ASENAPINE MALEATE 5 MG SL SUBL
5.0000 mg | SUBLINGUAL_TABLET | Freq: Every day | SUBLINGUAL | Status: DC
  Administered 2015-04-30 – 2015-05-03 (×4): 5 mg via SUBLINGUAL
  Filled 2015-04-29 (×5): qty 1

## 2015-04-29 NOTE — ED Notes (Addendum)
Patient is meeting with a visitor. Patient does not have any signs of distress at this time. Maintained on 15 minute checks and observation by security camera for safety.

## 2015-04-29 NOTE — ED Notes (Signed)
Patient resting quietly in room. No noted distress or abnormal behaviors noted. Will continue 15 minute checks and observation by security camera for safety. 

## 2015-04-29 NOTE — BH Assessment (Signed)
Patient is to be admitted to [facility] by Dr. Toni Amend.  Attending Physician will be Dr. Jennet Maduro.   Patient has been assigned to room 307, by [facility] Charge Nurse [place].

## 2015-04-29 NOTE — ED Notes (Signed)
Pt. Noted in room resting quietly;. No complaints or concerns voiced. No distress or abnormal behavior noted. Will continue to monitor with security cameras. Q 15 minute rounds continue. 

## 2015-04-29 NOTE — ED Provider Notes (Signed)
-----------------------------------------   6:39 AM on 04/29/2015 -----------------------------------------   Blood pressure 125/72, pulse 92, temperature 98.3 F (36.8 C), temperature source Oral, resp. rate 20, height  (1.753 m), weight 122.471 kg, SpO2 99 %.  The patient had no acute events since last update.  Calm and cooperative at this time.  Patient still awaiting ankle bracelet, then may be discharged according to psychiatry notes.  Loleta Rose, MD 04/29/15 [phone]

## 2015-04-29 NOTE — ED Notes (Signed)
Patient currently meeting with a visitor. Patient is calm and cooperative at this time. Maintained on 15 minute checks and observation by security camera for safety.

## 2015-04-29 NOTE — ED Notes (Signed)
Patient is currently in room resting. Patient came and apologized to nurse stating that he "lost it" and that he "loses it" more intensely than the average individual. He appears to be more calm at this point. Will continue to monitor. Maintained on 15 minute checks and observation by security camera for safety.

## 2015-04-29 NOTE — ED Notes (Signed)
Pt. Noted in  room watching the tv.;. No complaints or concerns voiced. No distress or abnormal behavior noted. Will continue to monitor with security cameras. Q 15 minute rounds continue. 

## 2015-04-29 NOTE — ED Notes (Signed)
Pt. Noted in room watching the tv;Marland Kitchen No complaints or concerns voiced. No distress or abnormal behavior noted; currently waiting on Behavioral Medicine Inpatient Unit transfer; Will continue to monitor with security cameras. Q 15 minute rounds continue.

## 2015-04-29 NOTE — ED Notes (Signed)
Patient received breakfast tray 

## 2015-04-29 NOTE — ED Notes (Signed)
At approximately 1510, the patient was speaking on the phone and seemed to become increasingly angry. Patient yelled, "Get me the f@!! out of here", slammed the phone to the ground and attempted to throw the chair in the room at the door. He then beat his fists against the door repeatedly and continued to yell and scream that he wanted to leave the hospital. Nurse coaxed patient from outside the door to take Ativan by mouth, which patient refused by yelling that he was not going to take any medicine. When nurse arrived with IM Geodon, patient yelled at staff and security that he would not take the medicine and that they would need "more people than that" to make him take it. At this point, staff informed the patient that he would need to take the medicine per physician's order, and then he reluctantly complied to take the IM medication. Will continue to monitor the patient for increased aggression and safety. Maintained on 15 minute checks and observation by security camera for safety.

## 2015-04-29 NOTE — ED Notes (Signed)
Report received from Karena T., RN. Pt. Alert and oriented in no distress denies SI, HI, AVH and pain.  Pt. Instructed to come to me with problems or concerns.Will continue to monitor for safety via security cameras and Q 15 minute checks. 

## 2015-04-29 NOTE — ED Notes (Signed)
Patient is currently in room resting at this time. No signs of distress noted. Patient took all scheduled medications.  Maintained on 15 minute checks and observation by security camera for safety.

## 2015-04-29 NOTE — ED Notes (Signed)
Patient currently states that he is feeling increased anxiety due to uncertainty of when an ankle bracelet will be placed so that the patient can be discharged. He states that he is trying to continue to keep his "anger under control" during this time. Patient denies SI/HI/AVH and pain. Patient remains calm and cooperative. Will administer 1 mg of Ativan for anxiety. Will continue to monitor. Maintained on 15 minute checks and observation by security camera for safety.

## 2015-04-29 NOTE — ED Notes (Signed)
ENVIRONMENTAL ASSESSMENT Potentially harmful objects out of patient reach: Yes Personal belongings secured: Yes Patient dressed in hospital provided attire only: Yes Plastic bags out of patient reach: Yes Patient care equipment (cords, cables, call bells, lines, and drains) shortened, removed, or accounted for: Yes Equipment and supplies removed from bottom of stretcher: Yes Potentially toxic materials out of patient reach: Yes Sharps container removed or out of patient reach: Yes  Patient currently in room sleeping. No signs of distress noted. Maintained on 15 minute checks and observation by security camera for safety.  

## 2015-04-29 NOTE — Consult Note (Signed)
[facility] Face-to-Face Psychiatry Consult   Reason for Consult:  Follow-up consult for this 25 year old man with mood instability here in the emergency room Referring Physician:  Darnelle Catalan Patient Identification: Chase Hampton MRN:  [id number] Principal Diagnosis: Bipolar disorder, curr episode mixed, severe, with psychotic features [facility]) Diagnosis:   Patient Active Problem List   Diagnosis Date Noted  . Bipolar disorder, curr episode mixed, severe, with psychotic features (HCC) [F31.64] 04/20/2015  . Suicidal ideation [R45.851] 04/20/2015  . Homicidal ideation [R45.850] 04/20/2015  . Problems related to other legal circumstances [Z65.3] 04/20/2015    Total Time spent with patient: 30 minutes  Subjective:   Chase Hampton is a 25 y.o. male patient admitted with "I just got a little frustrated".  HPI:  Patient interviewed. Chart reviewed. Case reviewed with emergency room staff. This patient who has been waiting in the emergency room got frustrated this afternoon when he learned that the ankle bracelet would not be available until next week. Patient was reportedly yelling and turned over one of the heavy chairs. I'm not told that he directed any violence directly at anyone. He was given an intramuscular shot of Geodon. By the time I spoke with him he had calm down significantly. He was able to explain what he was angry about. He was able to discuss how going downstairs would relieve his frustration and anger because he would have treatment in the form of groups and other professionals around, more space to move, more timely access to his family. All of this made sense to me. Under the circumstances I think the best thing to do would be to admit him to the hospital downstairs now that we know that he is likely to need to be in the hospital for several more days. Case discussed with Dr. Nadara Mode poor will be on call this weekend. Orders placed.  Past Psychiatric History: See previous notes this young  man has a history of mood instability with a possible diagnosis of a atypical bipolar disorder.  Risk to Self: Suicidal Ideation: No-Not Currently/Within Last 6 Months Suicidal Intent: No-Not Currently/Within Last 6 Months Is patient at risk for suicide?: No Suicidal Plan?: No-Not Currently/Within Last 6 Months Access to Means: No Specify Access to Suicidal Means: Currently hospitalized What has been your use of drugs/alcohol within the last 12 months?: social drinking How many times?: 3 Other Self Harm Risks: denied Triggers for Past Attempts: Other (Comment) (Emotional and relationship stressors) Intentional Self Injurious Behavior: None Risk to Others: Homicidal Ideation: No Thoughts of Harm to Others: No-Not Currently Present/Within Last 6 Months Current Homicidal Intent: No-Not Currently/Within Last 6 Months Current Homicidal Plan: No Access to Homicidal Means: No Identified Victim: Denied History of harm to others?: No Assessment of Violence: None Noted Violent Behavior Description: denied Does patient have access to weapons?: No Criminal Charges Pending?: Yes Describe Pending Criminal Charges: Breaking a restraining order, B&E, Resisting arrest,  Does patient have a court date: Yes Court Date: 04/19/15 (04/18/2015, 05/06/2015, 05/10/2015, 06/14/2015 ) Prior Inpatient Therapy: Prior Inpatient Therapy: Yes Prior Therapy Dates: 02/2015 (3 placements in the last month) Prior Therapy Facilty/Provider(s): [facility], [facility] Reason for Treatment: Major Depression Prior Outpatient Therapy: Prior Outpatient Therapy: Yes Prior Therapy Dates: Current Prior Therapy Facilty/Provider(s): [facility]  Reason for Treatment: Depression  Does patient have an ACCT team?: No Does patient have Intensive In-House Services?  : No Does patient have [facility] services? : No Does patient have P4CC services?: No  Past Medical  History:  Past Medical History   Diagnosis Date  . GERD (gastroesophageal reflux disease)   . Anxiety   . Depression    No past surgical history on file. Family History:  Family History  Problem Relation Age of Onset  . Heart disease Maternal Grandfather   . Diabetes Maternal Grandfather   . Heart disease Paternal Grandmother    Family Psychiatric  History: Not identified Social History:  History  Alcohol Use No     History  Drug Use  . Yes  . Special: Marijuana    Social History   Social History  . Marital Status: Single    Spouse Name: N/A  . Number of Children: N/A  . Years of Education: N/A   Social History Main Topics  . Smoking status: Former Smoker    Types: Cigars  . Smokeless tobacco: Never Used  . Alcohol Use: No  . Drug Use: Yes    Special: Marijuana  . Sexual Activity: Not Asked   Other Topics Concern  . None   Social History Narrative   Additional Social History:    Allergies:   Allergies  Allergen Reactions  . Haldol [Haloperidol Lactate] Other (See Comments)    Labs: No results found for this or any previous visit (from the past 48 hour(s)).  Current Facility-Administered Medications  Medication Dose Route Frequency Provider Last Rate Last Dose  . asenapine (SAPHRIS) sublingual tablet 5 mg  5 mg Sublingual QHS Darci Current, MD   5 mg at 04/28/15 2140  . divalproex (DEPAKOTE ER) 24 hr tablet 1,000 mg  1,000 mg Oral Daily Jennye Moccasin, MD   1,000 mg at 04/29/15 1008  . LORazepam (ATIVAN) tablet 1 mg  1 mg Oral TID PRN Darien Ramus, MD   1 mg at 04/29/15 1008  . meloxicam (MOBIC) tablet 15 mg  15 mg Oral Daily Jennye Moccasin, MD   15 mg at 04/29/15 1007   Current Outpatient Prescriptions  Medication Sig Dispense Refill  . asenapine (SAPHRIS) 5 MG SUBL 24 hr tablet Place 5 mg under the tongue Nightly.    . divalproex (DEPAKOTE ER) 500 MG 24 hr tablet Take 2 tablets (1,000 mg total) by mouth daily. 60 tablet 0  . FLUoxetine (PROZAC) 20 MG capsule Take 20 mg by  mouth daily.    Marland Kitchen LORazepam (ATIVAN) 0.5 MG tablet Take 1 tablet (0.5 mg total) by mouth at bedtime as needed for anxiety. 30 tablet 0  . meloxicam (MOBIC) 15 MG tablet Take 1 tablet (15 mg total) by mouth daily. 30 tablet 1    Musculoskeletal: Strength & Muscle Tone: within normal limits Gait & Station: normal Patient leans: N/A  Psychiatric Specialty Exam: Review of Systems  Constitutional: Negative.   HENT: Negative.   Eyes: Negative.   Respiratory: Negative.   Cardiovascular: Negative.   Gastrointestinal: Negative.   Musculoskeletal: Negative.   Skin: Negative.   Neurological: Negative.   Psychiatric/Behavioral: Positive for depression. Negative for suicidal ideas, hallucinations, memory loss and substance abuse. The patient is nervous/anxious and has insomnia.     Blood pressure 124/69, pulse 79, temperature 98.3 F (36.8 C), temperature source Oral, resp. rate 20, height  (1.753 m), weight 122.471 kg (270 lb), SpO2 100 %.Body mass index is 39.85 kg/(m^2).  General Appearance: Casual  Eye Contact::  Fair  Speech:  Normal Rate  Volume:  Normal  Mood:  Dysphoric  Affect:  Constricted  Thought Process:  Linear  Orientation:  Full (Time, Place, and Person)  Thought Content:  Negative  Suicidal Thoughts:  No  Homicidal Thoughts:  No  Memory:  Immediate;   Good Recent;   Fair Remote;   Fair  Judgement:  Fair  Insight:  Fair  Psychomotor Activity:  Decreased  Concentration:  Fair  Recall:  [employer] of Knowledge:Fair  Language: Fair  Akathisia:  No  Handed:  Right  AIMS (if indicated):     Assets:  Communication Skills Desire for Improvement Financial Resources/Insurance Housing Physical Health Resilience Social Support  ADL's:  Intact  Cognition: WNL  Sleep:      Treatment Plan Summary: Daily contact with patient to assess and evaluate symptoms and progress in treatment, Medication management and Plan I have asked the nurses downstairs on the ward and  they are agreeable to his admission. He will be continued on his current dose of Saphris and Depakote and Ativan. Patient will be on observation when he first goes downstairs. I've reviewed the plan with him and he is agreeable to this and did not become angry when we discussed it. Hopefully this will improve his mood and allow him to better tolerate his time in the hospital. Orders done. Reviewed with emergency room staff. During the decision-making process I also briefly discussed the case with the medical director of the hospital.  Disposition: Recommend psychiatric Inpatient admission when medically cleared. Supportive therapy provided about ongoing stressors.  Mordecai Rasmussen, MD 04/29/2015 4:23 PM

## 2015-04-29 NOTE — ED Notes (Addendum)
Mother called stating that on a phone call with the patient the patient seemed to become quite agitated because he didn't understand why he his ankle monitor was not placed today. Mother told nurse that the patient stated that one way or another he would leave today.  Nurse assured mother that patient would be monitored for increased aggression and for safety. Maintained on 15 minute checks and observation by security camera for safety.

## 2015-04-29 NOTE — Tx Team (Addendum)
Initial Interdisciplinary Treatment Plan   PATIENT STRESSORS: Legal issue Loss of relationship with fiance Marital or family conflict   PATIENT STRENGTHS: Active sense of humor Average or above average intelligence Capable of independent living Communication skills General fund of knowledge Physical Health Supportive family/friends   PROBLEM LIST: Problem List/Patient Goals Date to be addressed Date deferred Reason deferred Estimated date of resolution  Aggression 04/29/15     History of suicide attempt 04/29/15           "I want to learn more coping skills" 04/29/15                                    DISCHARGE CRITERIA:  Improved stabilization in mood, thinking, and/or behavior Motivation to continue treatment in a less acute level of care Need for constant or close observation no longer present Reduction of life-threatening or endangering symptoms to within safe limits Verbal commitment to aftercare and medication compliance  PRELIMINARY DISCHARGE PLAN: Attend aftercare/continuing care group Outpatient therapy Participate in family therapy Return to previous living arrangement  PATIENT/FAMIILY INVOLVEMENT: This treatment plan has been presented to and reviewed with the patient, Chase Hampton, and/or family member.  The patient and family have been given the opportunity to ask questions and make suggestions.  [name] 04/29/2015, 11:27 PM

## 2015-04-29 NOTE — ED Notes (Signed)
Patient asleep in room. No noted distress or abnormal behavior. Will continue 15 minute checks and observation by security cameras for safety. 

## 2015-04-29 NOTE — ED Notes (Signed)
Patient is currently on the phone. No signs of distress at the moment. Maintained on 15 minute checks and observation by security camera for safety.

## 2015-04-29 NOTE — ED Notes (Signed)
Snack and beverage given. 

## 2015-04-30 ENCOUNTER — Encounter: Payer: Self-pay | Admitting: Psychiatry

## 2015-04-30 ENCOUNTER — Inpatient Hospital Stay: Payer: BLUE CROSS/BLUE SHIELD

## 2015-04-30 DIAGNOSIS — F331 Major depressive disorder, recurrent, moderate: Principal | ICD-10-CM

## 2015-04-30 MED ORDER — GADOBENATE DIMEGLUMINE 529 MG/ML IV SOLN
20.0000 mL | Freq: Once | INTRAVENOUS | Status: AC | PRN
  Administered 2015-04-30: 20 mL via INTRAVENOUS

## 2015-04-30 MED ORDER — DULOXETINE HCL 20 MG PO CPEP
40.0000 mg | ORAL_CAPSULE | Freq: Every day | ORAL | Status: DC
  Administered 2015-04-30 – 2015-05-04 (×5): 40 mg via ORAL
  Filled 2015-04-30 (×5): qty 2

## 2015-04-30 NOTE — BHH Suicide Risk Assessment (Signed)
[facility] Admission Suicide Risk Assessment   Nursing information obtained from:  Patient, Review of record Demographic factors:  Male, Adolescent or young adult, Caucasian Current Mental Status:  NA Loss Factors:  Loss of significant relationship, Legal issues Historical Factors:  Prior suicide attempts, Impulsivity Risk Reduction Factors:  Sense of responsibility to family, Living with another person, especially a relative, Positive social support, Positive therapeutic relationship  Total Time spent with patient: 1 hour Principal Problem: <principal problem not specified> Diagnosis:   Patient Active Problem List   Diagnosis Date Noted  . Bipolar affective disorder (HCC) [F31.9] 04/29/2015  . Bipolar disorder, curr episode mixed, severe, with psychotic features (HCC) [F31.64] 04/20/2015  . Suicidal ideation [R45.851] 04/20/2015  . Homicidal ideation [R45.850] 04/20/2015  . Problems related to other legal circumstances [Z65.3] 04/20/2015   Subjective Data:   Continued Clinical Symptoms:  Alcohol Use Disorder Identification Test Final Score (AUDIT): 1 The "Alcohol Use Disorders Identification Test", Guidelines for Use in Primary Care, Second Edition.  World Science writer [facility]). Score between 0-7:  no or low risk or alcohol related problems. Score between 8-15:  moderate risk of alcohol related problems. Score between 16-19:  high risk of alcohol related problems. Score 20 or above:  warrants further diagnostic evaluation for alcohol dependence and treatment.   CLINICAL FACTORS:   Depression:   Impulsivity Previous Psychiatric Diagnoses and Treatments    Psychiatric Specialty Exam: ROS                                                          COGNITIVE FEATURES THAT CONTRIBUTE TO RISK:  None    SUICIDE RISK:   Mild:  Suicidal ideation of limited frequency, intensity, duration, and specificity.  There are no identifiable plans, no associated  intent, mild dysphoria and related symptoms, good self-control (both objective and subjective assessment), few other risk factors, and identifiable protective factors, including available and accessible social support.  PLAN OF CARE: admit to [facility]  I certify that inpatient services furnished can reasonably be expected to improve the patient's condition.   Jimmy Footman, MD 04/30/2015, 9:54 AM

## 2015-04-30 NOTE — BHH Counselor (Signed)
Adult Comprehensive Assessment  Patient ID: Chase Hampton, male DOB: 04-Jun-1990, 25 y.o. MRN: [id number]  Information Source: Information source: Patient  Current Stressors:  Educational / Learning stressors: None reported  Employment / Job issues: Currently employed.  Family Relationships: None reported.  Financial / Lack of resources (include bankruptcy): None reported  Housing / Lack of housing: Lives with parents.  Physical health (include injuries & life threatening diseases): Past head trauma due to sports.  Social relationships: Ex-girlfriend and pt broke up and called off the engagement.  Substance abuse: Denies use.  Bereavement / Loss: Fiance recently broke up with him  Living/Environment/Situation:  Living Arrangements: Lives with parents Living conditions (as described by patient or guardian): "good"  How long has patient lived in current situation?: Since May, 2016 What is atmosphere in current home: Comfortable, Supportive  Family History:  Marital status: Other (comment) Are you sexually active?: No What is your sexual orientation?: Heterosexual  Has your sexual activity been affected by drugs, alcohol, medication, or emotional stress?: None reported  Does patient have children?: No  Childhood History:  By whom was/is the patient raised?: Both parents Description of patient's relationship with caregiver when they were a child: Good relationship with parents  Patient's description of current relationship with people who raised him/her: Good relationship with parents  How were you disciplined when you got in trouble as a child/adolescent?: None reported  Does patient have siblings?: Yes Number of Siblings: 2 Description of patient's current relationship with siblings: sister and brother in law. Good relationship with them.  Did patient suffer any verbal/emotional/physical/sexual abuse as a child?: No Did patient suffer from severe  childhood neglect?: No Has patient ever been sexually abused/assaulted/raped as an adolescent or adult?: No Was the patient ever a victim of a crime or a disaster?: No Witnessed domestic violence?: No Has patient been effected by domestic violence as an adult?: No  Education:  Highest grade of school patient has completed: Chief Operating Officer degree Currently a student?: No Learning disability?: No  Employment/Work Situation:  Employment situation: Employed Where is patient currently employed?: [employer] long has patient been employed?: Since May 2016 Patient's job has been impacted by current illness: No What is the longest time patient has a held a job?: 3 years  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  Has patient ever been in the [employer]?: No Are There Guns or Other Weapons in Your Home?: Yes Types of Guns/Weapons: Higher education careers adviser gun  Are These Geophysical data processor Secured?: Yes (Pt's brother in law has it locked in a safe away from the patient.)  Financial Resources:  Financial resources: Income from employment Does patient have a representative payee or guardian?: No  Alcohol/Substance Abuse:  What has been your use of drugs/alcohol within the last 12 months?: None reported  Alcohol/Substance Abuse Treatment Hx: Denies past history  Social Support System:  Forensic psychologist System: Good Describe Community Support System: Family  Type of faith/religion: Christianity  How does patient's faith help to cope with current illness?: Comfort   Leisure/Recreation:  Leisure and Hobbies: netflix, walking dog.  Strengths/Needs:  What things does the patient do well?: "my job"  In what areas does patient struggle / problems for patient: anxiety, depression, anger, relationship problems.   Discharge Plan:  Does patient have access to transportation?: Yes, parents will pick the pt up by car Will patient be returning to same living situation after  discharge?: Yes Currently receiving community mental health services: Yes (From  Whom) [facility] Ramond Dial, pt's therapist, Pt has a Neurology appt with Dr. Tonny Branch   Does patient have financial barriers related to discharge medications?: No  Summary/Recommendations:    Patient is a 25 year old male admitted with a diagnosis of Major Depression. Patient presented to the hospital due to judge ordering mental health treatment upon being released from jail. Pt has been hospitalized at [facility] three times since November 2016. After his last admission, he broke into his ex- fiance's house and threatened a murder suicide. He then took off to [place] and his parents found him. On their way back from [place] he attempted to jump out of a moving car in [facility]. He was then hospitalized in [facility]. After being discharged, he was sent to jail. He is currently waiting for the [facility] police department to bring a ankle monitor before he can be discharged. Pt plans to return home and follow up with outpatient. His parents have set up an appointment for him at the [facility] in [place] for testing and treatment.  Patient will benefit from crisis stabilization, medication evaluation, group therapy and psycho education in addition to case management for discharge. At discharge, it is recommended that patient remain compliant with established discharge plan and continued treatment.   Etheridge Geil L Wilsie Kern MSW, [address]

## 2015-04-30 NOTE — BHH Group Notes (Signed)
BHH LCSW Group Therapy  04/30/2015 3:25 PM  Type of Therapy:  Group Therapy  Participation Level:  Did Not Attend  Modes of Intervention:  Discussion, Education, Socialization and Support  Summary of Progress/Problems: Boundaries: Patients defined boundaries and discussed the importance of them. Patients identified their own boundaries and how they feel when they are crossed. Patients discussed ways to create and/ or improve their personal boundaries.   Karol Liendo L Carrisa Keller MSW, LCSWA  04/30/2015, 3:25 PM  

## 2015-04-30 NOTE — H&P (Addendum)
Psychiatric Admission Assessment Adult  Patient Identification: KYLIE GROS MRN:  [id number] Date of Evaluation:  04/30/2015 Chief Complaint:  Bipolar Principal Diagnosis: Major depressive disorder, recurrent episode, moderate (HCC) Diagnosis:   Patient Active Problem List   Diagnosis Date Noted  . Major depressive disorder, recurrent episode, moderate (HCC) [F33.1] 04/30/2015   History of Present Illness:  Patient is a 25 y/o single, caucasian male from [place] who presented to our ER on 04/19/15 under petition from local jail due to suicidal ideation. He is out on bond and the the conditions for his release  stipulate that he must be either in a mental health treatment or have an ankle monitor. Parents filed commitment paperwork.   This patient is known to me as he has been admitted to our behavioral health unit twice in Nov and then Dec 2016.  Patient had no known prior psychiatric history before his first admission in November after his fiance of 5 years broke off their engagement and cancelled the wedding which was planned for Dec 31st.   In Nov patient was admitted as he reported depressive symptoms, suicidality and homicidality.  Patient was holding a gun and threatening to kill himself and his exfiance.  He was diagnosed with MDD and prescribed with fluoxetine.  During that admission he reported a long history of anger management issues that started in childhood.  He also reported h/o multiple concussion while playing football and other contact sports in school.  Patient was schedule to f/u with neurology in order to r/o head injury.  Patient came back again to our ER on 12/12 after getting into an altercation with his father and then stated he was getting in his car to go kill his fiance and her family who live very close by. Police were called. The patient was belligerent and violent.  During that admission depakote was added in order to target irritability and aggression.   Prior to his discharge there was a restraining order in place filed by his exfiance.  Patient tells me that on 12/31 he violated the restraining by attempting to call and text her.  He went to jail for 2 days were he became more depressed and started having suicidal thoughts.    He again violated the restraining order early in January when he went to her house.  Prior to getting arrested he drove to [place] were his grandparents live.  His parents went to [facility] to bring him back to [facility]. On the way back to [place] he attempted to jump out of the car (in [facility]) and then tried to tie a cord around his neck.  He was hospitalized psychiatrically in [facility] for 7 days.  Patient reports he was treated for depression and fluoxetine was increased to 40 mg.  After his d/c from [facility] he was taken in to custody in [place] for 5 days.  During his incarceration he started having passive suicidal thoughts "what is the point in living".  He was released on bond with the condition that he was going to be in a psychiatric facility and had an ankle monitor placed upon discharge.  Patient was brought in to our ER were he was referred to  [facility] for hospitalization.  [facility] has forensic services and neuropsychologist (for possible TBI evaluation) these services are no available in our facility.  Patient was in our ER for 10 days awaiting admission there.  Patient started becoming agitated (pt was frustrated with the wait) and on 2/3 he  broke one of the phone in the ER [facility] area.  Eventually it was decided to transfer the patient to our unit.  Patient is currently reporting that his mood is still depressed, he has insomnia and feelings of guilt about the distress he has caused for his parents. He says he has intermittent suicidal thoughts (passive suicidality, no plan).  Denies having hallucinations or homicidality.  During prior hospitalizations there was no evidence of mania, hypomania, delusions, psychosis, or cognitive  deficits.  Substance abuse: patient denies the use of illicit substances, abusing prescriptions medications or alcohol.  Denies use of tobacco.  Associated Signs/Symptoms: Depression Symptoms:  depressed mood, insomnia, feelings of worthlessness/guilt, suicidal thoughts without plan, panic attacks, (Hypo) Manic Symptoms:  no euphoria, or labile mood, no psychomotor agitation, no pressure speech, thought process was linear and logical.  No evidence of flight of ideas.  Anxiety Symptoms:  Panic Symptoms, Psychotic Symptoms:  no evidence of delusions, paranoia, hallucinations. PTSD Symptoms: Negative Total Time spent with patient: 1 hour   Past Psychiatric History: no prior psychiatric history before Nov 2016 (no suicidal attempts, never prescribed with any psychotropics).   1st Hospitalization in our unit from 02/23/15 to 03/01/15.  "presented to the emergency department voicing worsening depression, thoughts of suicide and some homicidal ideation towards his girlfriend. These worsening of symptoms was triggered after his girlfriend of 5 years broke up with him. Patient reports a long history of anger issues that started in childhood. The patient also has history of multiple concussions due to playing football. Per reports from the patient's family he was holding a gun and threatening to kill himself and her."  He was diagnosed with depression and was started on fluoxetine.  2nd Hospitalization in our unit: 03/07/15 to 03/14/15: "presented to our ER on 12/11 under police custody. According to report he got into an altercation with his father and then stated he was getting in his car to go kill his fiance and her family which lives very close by. Police were called. Tthe patient was belligerent and violent."  Diagnosed with MDD and d/c on fluoxetine and depakote for irritability and anger.  3rd Hospitalization in [place]: no records available: jump out of his parents car and tried to  tied a cord around his neck.  Per pt, prozac was increased to 40 mg.  Depakote was d/c.  After his second hospitalization he f/u with [facility] in [place] were saphris was added to prozac and depakote.  He also f/o with Ramond Dial for therapy.    Risk to Self: Is patient at risk for suicide?: No Risk to Others:  no  Alcohol Screening: 1. How often do you have a drink containing alcohol?: Monthly or less 2. How many drinks containing alcohol do you have on a typical day when you are drinking?: 1 or 2 3. How often do you have six or more drinks on one occasion?: Never Preliminary Score: 0 4. How often during the last year have you found that you were not able to stop drinking once you had started?: Never 5. How often during the last year have you failed to do what was normally expected from you becasue of drinking?: Never 6. How often during the last year have you needed a first drink in the morning to get yourself going after a heavy drinking session?: Never 7. How often during the last year have you had a feeling of guilt of remorse after drinking?: Never 8. How often during the last  year have you been unable to remember what happened the night before because you had been drinking?: Never 9. Have you or someone else been injured as a result of your drinking?: No 10. Has a relative or friend or a doctor or another health worker been concerned about your drinking or suggested you cut down?: No Alcohol Use Disorder Identification Test Final Score (AUDIT): 1 Brief Intervention: AUDIT score less than 7 or less-screening does not suggest unhealthy drinking-brief intervention not indicated  Past Medical History: Patient is overweight and has been told by his doctor in the past that he was on the borderline for diabetes but has no diagnosed medical problems. No current medication. Patient reports history of multiple concussions while playing football.  States during college he had a shoulder injury  which limited his ability to do sports. Past Medical History  Diagnosis Date  . GERD (gastroesophageal reflux disease)   . Anxiety   . Depression     Past Surgical History  Procedure Laterality Date  . No past surgeries     Family History:  Family History  Problem Relation Age of Onset  . Heart disease Maternal Grandfather   . Diabetes Maternal Grandfather   . Heart disease Paternal Grandmother    Family Psychiatric  History:mother takes cymbalta for depression.  No family history of other mental illness or suicide.  Social History: Single, never married, no children.  Born and raised in [place].  Has 2 siblings. Currently living with his parents and siblings in [place]. 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 He currently works for [employer] in [place].  No prior legal history before December of 2016.   Currently he has multiple charges for violating restraining order several times.  Court order in place stating when d/c from psychiatry he would need an ankle bracelet. History  Alcohol Use No     History  Drug Use  . Yes  . Special: Marijuana    Allergies:   Allergies  Allergen Reactions  . Haldol [Haloperidol Lactate] Other (See Comments)   Lab Results: No results found for this or any previous visit (from the past 48 hour(s)).  Metabolic Disorder Labs:  Lab Results  Component Value Date   HGBA1C 5.2 02/24/2015   No results found for: PROLACTIN Lab Results  Component Value Date   CHOL 167 02/24/2015   TRIG 81 02/24/2015   HDL 41 02/24/2015   CHOLHDL 4.1 02/24/2015   VLDL 16 02/24/2015   LDLCALC 110* 02/24/2015    Current Medications: Current Facility-Administered Medications  Medication Dose Route Frequency Provider Last Rate Last Dose  . acetaminophen (TYLENOL) tablet 650 mg  650 mg Oral Q6H PRN Audery Amel, MD      . asenapine (SAPHRIS) sublingual tablet 5 mg  5 mg Sublingual  QHS Audery Amel, MD      . divalproex (DEPAKOTE ER) 24 hr tablet 1,000 mg  1,000 mg Oral Daily Audery Amel, MD   1,000 mg at 04/30/15 0949  . DULoxetine (CYMBALTA) DR capsule 40 mg  40 mg Oral Daily Jimmy Footman, MD   40 mg at 04/30/15 1610  . LORazepam (ATIVAN) tablet 1 mg  1 mg Oral TID PRN Audery Amel, MD      . magnesium hydroxide (MILK OF MAGNESIA) suspension 30 mL  30 mL Oral Daily PRN Audery Amel, MD      . meloxicam [facility]) tablet 15  mg  15 mg Oral Daily Audery Amel, MD   15 mg at 04/30/15 4098   PTA Medications: Prescriptions prior to admission  Medication Sig Dispense Refill Last Dose  . asenapine (SAPHRIS) 5 MG SUBL 24 hr tablet Place 5 mg under the tongue Nightly.     . divalproex (DEPAKOTE ER) 500 MG 24 hr tablet Take 2 tablets (1,000 mg total) by mouth daily. 60 tablet 0 04/18/2015 at Unknown time  . LORazepam (ATIVAN) 0.5 MG tablet Take 1 tablet (0.5 mg total) by mouth at bedtime as needed for anxiety. 30 tablet 0 04/18/2015 at Unknown time  . meloxicam (MOBIC) 15 MG tablet Take 1 tablet (15 mg total) by mouth daily. 30 tablet 1 04/18/2015 at Unknown time    Musculoskeletal: Strength & Muscle Tone: within normal limits Gait & Station: normal Patient leans: N/A  Psychiatric Specialty Exam: Physical Exam  Constitutional: He is oriented to person, place, and time. He appears well-developed and well-nourished.  HENT:  Head: Normocephalic and atraumatic.  Eyes: EOM are normal.  Neck: Normal range of motion. Neck supple.  Respiratory: Effort normal.  Musculoskeletal: Normal range of motion.  Neurological: He is alert and oriented to person, place, and time.    Review of Systems  Constitutional: Negative.   HENT: Negative.   Eyes: Negative.   Respiratory: Negative.   Cardiovascular: Negative.   Gastrointestinal: Negative.   Genitourinary: Negative.   Musculoskeletal: Negative.   Skin: Negative.   Neurological: Negative.    Endo/Heme/Allergies: Negative.   Psychiatric/Behavioral: Positive for depression. Negative for suicidal ideas, hallucinations, memory loss and substance abuse. The patient is nervous/anxious. The patient does not have insomnia.     Blood pressure 116/82, pulse 90, temperature 97.7 F (36.5 C), temperature source Oral, resp. rate 20, height 5\' 9"  (1.753 m), weight 122.925 kg (271 lb).Body mass index is 40 kg/(m^2).  General Appearance: Disheveled  Eye Contact::  Good  Speech:  Clear and Coherent  Volume:  Normal  Mood:  Dysphoric  Affect:  Blunt  Thought Process:  Linear and Logical  Orientation:  Full (Time, Place, and Person)  Thought Content:  Hallucinations: None  Suicidal Thoughts:  No  Homicidal Thoughts:  No  Memory:  Immediate;   Good Recent;   Good Remote;   Good  Judgement:  Fair  Insight:  Fair  Psychomotor Activity:  Decreased  Concentration:  Good  Recall:  Good  Fund of Knowledge:Good  Language: Good  Akathisia:  No  Handed:    AIMS (if indicated):     Assets:  Architect Housing Physical Health Social Support Talents/Skills Transportation Vocational/Educational  ADL's:  Intact  Cognition: WNL  Sleep:  Number of Hours: 6.25     Treatment Plan Summary: Daily contact with patient to assess and evaluate symptoms and progress in treatment and Medication management  25 y/o wm with 4 hospitalizations since Nov 2016 for suicidality and homicidality.  No known prior h/o psychiatric problems or criminal behavior until his girlfriend of 21 y/o ended their relationship and canceled their wedding.  Since then pt has accumulated a multitude of charges for violating restraining order filed by his ex.  MDD: patient has been on prozac since December. Reports no significant benefit with fluoxetine.  Medication was d/c in the ER.  Since it was d/c mood has worsen.  Pt would like to restart an antidepressant.  Says his mother takes  cymbalta with good response.  Discussed SE, pt aggress with a trial  of cymbalta.  Will start 40 mg po q day  Irritability-explosive episodes: continue depakote ER 1000 mg po qhs and saphris 5 mg qhs.  Off label use---pt understands.  Pt and family voicing concerns for possible brain injury----Will order brain MRI  Insomnia: continue ativan qhs  Agitation: continue ativan prn.  Personality d/o: consider MMPI in order to r/o cluster b personality disorder.  Metabolic syndrome screening: HbA1c 5.2, lipid panel mildly increased LDL  Prolactin levels: will order today  Labs:  TSH wnl.  Will order Depakote level tomorrow night and prolactin as he in on antipsychotic.  Imaging: MRI of brain with and w/po contracts has been ordered  Hospitalization status: will change admission to voluntary status.  Precautions q 15 minutes  Diet regular  VS: q day  Dispo: will d/c home with ankle monitoring as requested by [place].  F/u: will continue F/u with [facility] and Therapist Ramond Dial. Has f/u with neurology at [facility].  Plan to obtain collateral from family and records from [facility] hospitalization  >90 minutes.  Review of records, labs  I certify that inpatient services furnished can reasonably be expected to improve the patient's condition.    Jimmy Footman, MD 2/4/20179:58 AM

## 2015-04-30 NOTE — Progress Notes (Signed)
Pt has been pleasant and cooperative.Pt did attend some unit activities but has been mostly seclusive to his room. Pt's mood and affect has been pleasant. Pt denies SI and A/V hallucinations. No inappropriate behaviors noted this period. Pt taken to MRI for testing without incident .

## 2015-04-30 NOTE — Progress Notes (Signed)
Pt admitted to unit from [facility]. Pt is alert and oriented x4. Pt denies SI/HI/AVH at this time. Pt reports that he came here for a "psych evaluation" and was in the [facility] for 10 days. Pt does not have any concerns or complaints at this time. He is calm and cooperative with assessment. Pt reports that his biggest stressors are relationship issues with his family and ex-fiance. Pt states his goal while here is "to learn more positive coping skills." Skin search performed and no contraband found. Pt has stretch marks on his abdomen. Pt oriented to unit. q15 minute safety checks maintained. Pt remains free from harm.

## 2015-05-01 MED ORDER — DIVALPROEX SODIUM ER 500 MG PO TB24
1000.0000 mg | ORAL_TABLET | Freq: Every day | ORAL | Status: DC
  Administered 2015-05-02 – 2015-05-03 (×2): 1000 mg via ORAL
  Filled 2015-05-01 (×2): qty 2

## 2015-05-01 NOTE — Progress Notes (Signed)
Chase Hampton was cooperative on shift, he spent most of the evening in the dayroom talking with peers, he voiced no complaints on shift and he was medication complaint. He had no behavioral issues to report on shift at this time. He appeared to rest well through out the night.

## 2015-05-01 NOTE — [facility] Group Notes (Signed)
[facility] LCSW Group Therapy  05/01/2015 2:51 PM  Type of Therapy:  Group Therapy  Participation Level:  Minimal  Participation Quality:  Attentive  Affect:  Appropriate   Cognitive:  Alert  Insight:  Improving  Engagement in Therapy:  Improving  Modes of Intervention:  Activity, Discussion, Education, Socialization and Support  Summary of Progress/Problems:Feelings around diagnosis: Patients discussed what mental health means to them and how it has impacted their lives. Patients discussed receiving a diagnosis and how it made them feel. They were encouraged to share experiences related to mental health diagnosis and how other people have reacted. Patient attended group and stayed the entire time. He sat quietly and listened to other group members.   Marquet Faircloth L Pinchos Topel  MSW, LCSWA   05/01/2015, 2:51 PM

## 2015-05-01 NOTE — Progress Notes (Signed)
Pt has been pleasant and cooperative.Pt did attend some unit activities. Pt noted to be less seclusive to his room.Pt's mood and affect has been pleasant. Pt denies SI and A/V hallucinations. No inappropriate behaviors noted this period. Pt has been active on the unit.

## 2015-05-01 NOTE — Progress Notes (Signed)
[facility] MD Progress Note  05/01/2015 10:13 AM Chase Hampton  MRN:  [id number] Subjective:  Patient reports feeling somewhat better this morning he hasn't had any suicidal thoughts Friday night. He denies homicidality or having auditory or visual hallucinations. Yesterday the patient took his first dose of Cymbalta he tolerated it well his only side effect was headache and drowsiness. The patient reports his sleep and energy are fair in, he had some trouble falling asleep last night. His appetite and concentration are good. As far as physical complaints patient states that he has a below some neuropathic pain in both hands which he thinks is secondary to being handcuffed too tight back in December.  Reports having numbness and some swelling.  Per nursing: [name] was cooperative on shift, he spent most of the evening in the dayroom talking with peers, he voiced no complaints on shift and he was medication complaint. He had no behavioral issues to report on shift at this time. He appeared to rest well through out the night.  Principal Problem: Major depressive disorder, recurrent episode, moderate (HCC) Diagnosis:   Patient Active Problem List   Diagnosis Date Noted  . Major depressive disorder, recurrent episode, moderate (HCC) [F33.1] 04/30/2015   Total Time spent with patient: 30 minutes   Sleep: Good  Appetite:  Good   Past Medical History: Patient is overweight and has been told by his doctor in the past that he was on the borderline for diabetes but has no diagnosed medical problems. No current medication. Patient reports history of multiple concussions while playing football.  States during college he had a shoulder injury which limited his ability to do sports. Past Medical History  Diagnosis Date  . GERD (gastroesophageal reflux disease)   . Anxiety   . Depression     Past Surgical History  Procedure Laterality Date  . No past surgeries     Family History:   Family History  Problem Relation Age of Onset  . Heart disease Maternal Grandfather   . Diabetes Maternal Grandfather   . Heart disease Paternal Grandmother    Family Psychiatric History:mother takes cymbalta for depression. No family history of other mental illness or suicide.  Social History: Single, never married, no children. Born and raised in [place]. Has 2 siblings. Currently living with his parents and siblings in [place]. 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 He currently works for [employer] in [place].  No prior legal history before December of 2016. Currently he has multiple charges for violating restraining order several times. Court order in place stating when d/c from psychiatry he would need an ankle bracelet. History  Alcohol Use No    History  Drug Use  . Yes  . Special: Marijuana    Allergies:  Allergies  Allergen Reactions  . Haldol [Haloperidol Lactate] Other (See Comments)         Current Medications: Current Facility-Administered Medications  Medication Dose Route Frequency Provider Last Rate Last Dose  . acetaminophen (TYLENOL) tablet 650 mg  650 mg Oral Q6H PRN Audery Amel, MD      . asenapine (SAPHRIS) sublingual tablet 5 mg  5 mg Sublingual QHS Audery Amel, MD   5 mg at 04/30/15 2230  . divalproex (DEPAKOTE ER) 24 hr tablet 1,000 mg  1,000 mg Oral Daily Audery Amel, MD   1,000 mg at 05/01/15 0847  . DULoxetine (CYMBALTA) DR capsule 40 mg  40 mg Oral Daily Jimmy Footman, MD   40 mg at 05/01/15 0848  . LORazepam (ATIVAN) tablet 1 mg  1 mg Oral TID PRN Audery Amel, MD      . magnesium hydroxide (MILK OF MAGNESIA) suspension 30 mL  30 mL Oral Daily PRN Audery Amel, MD      . meloxicam (MOBIC) tablet 15 mg  15 mg Oral Daily Audery Amel, MD   15 mg at 05/01/15 0848    Lab Results: No results found for this or  any previous visit (from the past 48 hour(s)).  Physical Findings: AIMS: Facial and Oral Movements Muscles of Facial Expression: None, normal Lips and Perioral Area: None, normal Jaw: None, normal Tongue: None, normal,Extremity Movements Upper (arms, wrists, hands, fingers): None, normal Lower (legs, knees, ankles, toes): None, normal, Trunk Movements Neck, shoulders, hips: None, normal, Overall Severity Severity of abnormal movements (highest score from questions above): None, normal Incapacitation due to abnormal movements: None, normal Patient's awareness of abnormal movements (rate only patient's report): No Awareness, Dental Status Current problems with teeth and/or dentures?: No Does patient usually wear dentures?: No  CIWA:    COWS:     Musculoskeletal: Strength & Muscle Tone: within normal limits Gait & Station: normal Patient leans: N/A  Psychiatric Specialty Exam: Review of Systems  Constitutional: Negative.   HENT: Negative.   Eyes: Negative.   Respiratory: Negative.   Cardiovascular: Negative.   Gastrointestinal: Negative.   Musculoskeletal: Negative.   Skin: Negative.   Neurological: Negative.   Endo/Heme/Allergies: Negative.   Psychiatric/Behavioral: Positive for depression. Negative for suicidal ideas, hallucinations, memory loss and substance abuse. The patient is not nervous/anxious and does not have insomnia.     Blood pressure 112/71, pulse 72, temperature 98.2 F (36.8 C), temperature source Oral, resp. rate 20, height 5\' 9"  (1.753 m), weight 122.925 kg (271 lb).Body mass index is 40 kg/(m^2).  General Appearance: Fairly Groomed  Patent attorney::  Good  Speech:  Clear and Coherent  Volume:  Normal  Mood:  Dysphoric  Affect:  Appropriate  Thought Process:  Linear and Logical  Orientation:  Full (Time, Place, and Person)  Thought Content:  Hallucinations: None  Suicidal Thoughts:  No  Homicidal Thoughts:  No  Memory:  Immediate;   Good Recent;    Good Remote;   Good  Judgement:  Fair  Insight:  Fair  Psychomotor Activity:  Normal  Concentration:  Good  Recall:  Good  Fund of Knowledge:Good  Language: Good  Akathisia:  No  Handed:    AIMS (if indicated):     Assets:  Communication Skills Desire for Improvement Financial Resources/Insurance Housing Physical Health Social Support Talents/Skills Transportation Vocational/Educational  ADL's:  Intact  Cognition: WNL  Sleep:  Number of Hours: 6.25   Treatment Plan Summary: Daily contact with patient to assess and evaluate symptoms and progress in treatment and Medication management   25 y/o wm with 4 hospitalizations since Nov 2016 for suicidality and homicidality. No known prior h/o psychiatric problems or criminal behavior until his girlfriend of 40 y/o ended their relationship and canceled their wedding. Since then pt has accumulated a multitude of charges for violating restraining order filed by his ex.  MDD: patient has been on prozac since December. Reports no significant benefit with fluoxetine. Medication was d/c in the ER. Since it was d/c mood has worsen. Pt would like to restart an antidepressant. Says his mother takes cymbalta with good response. Discussed SE, pt aggress  with a trial of cymbalta.Started on Cymbalata 40 mg po q day.  Irritability-explosive episodes: continue depakote ER 1000 mg po qhs and saphris 5 mg qhs. Off label use---pt understands. Pt and family voicing concerns for possible brain injury----Brain MRI wnl  Insomnia: continue ativan qhs prn Agitation: continue ativan prn.  Personality d/o: consider MMPI in order to r/o cluster b personality disorder.  Neuropathic pain: Patient reports some tingling and numbness on the side of his hands that has been the result of being put in handcuffs back in December. Patient is currently on Cymbalta and is on Mobic  Metabolic syndrome screening: HbA1c 5.2, lipid panel mildly increased  LDL  Prolactin levels: will order today  Labs: TSH wnl. Will order a Depakote level and prolactin level on Monday night.  Imaging: MRI of brain----  WNL  Hospitalization status: will change admission to voluntary status.  Precautions q 15 minutes  Diet regular  VS: q day  Dispo: will d/c home with ankle monitoring as requested by [place] court.  F/u: will continue F/u with [facility] and Therapist Ramond Dial. Has f/u with neurology at [facility].  Plan to obtain collateral from family and records from [facility] hospitalization   Jimmy Footman, MD 05/01/2015, 10:13 AM

## 2015-05-01 NOTE — Plan of Care (Signed)
Problem: Ineffective individual coping Goal: STG: Patient will remain free from self harm Outcome: Progressing Patient denies any thoughts suicidal ideation.

## 2015-05-01 NOTE — [facility] Group Notes (Signed)
[facility] Group Notes:  (Nursing/MHT/Case Management/Adjunct)  Date:  05/01/2015  Time:  6:08 PM  Type of Therapy:  Community Meeting   Participation Level:  Did Not Attend  Devona Holmes De'Chelle Kindred Reidinger 05/01/2015, 6:08 PM

## 2015-05-02 NOTE — Progress Notes (Signed)
D:  Patient denies SI/HI/AVH.   Rates depression as a 4/10.  Declines to discuss what caused him to have to be admitted.   Complains of anxiety.   A:   Support and  Encouragement offered.   Safety maintained.  Medicated x1 for anxiety and other medications given per orders.  R:  Receptive to treatment plan.

## 2015-05-02 NOTE — [facility] Group Notes (Signed)
[facility] Group Notes:  (Nursing/MHT/Case Management/Adjunct)  Date:  05/02/2015  Time:  1:13 PM  Type of Therapy:  Psychoeducational Skills  Participation Level:  Active  Participation Quality:  Appropriate  Affect:  Appropriate  Cognitive:  Appropriate  Insight:  Appropriate  Engagement in Group:  Engaged  Modes of Intervention:  Discussion and Education  Summary of Progress/Problems:  Mickey Farber 05/02/2015, 1:13 PM

## 2015-05-02 NOTE — Progress Notes (Signed)
Recreation Therapy Notes  Date: 02.06.17 Time: 3:00 pm Location: Craft Room  Group Topic: Wellness  Goal Area(s) Addresses:  Patient will identify at least one item per dimension of health. Patient will examine areas they are deficient in.  Behavioral Response: Attentive  Intervention: 6 Dimensions of Health  Activity: Patients were given a sheets with the definitions of the 6 Dimensions of Health on it and a worksheet with the 6 Dimensions written out. Patients were instructed to write 2-3 things they are currently doing in each category.  Education: LRT educated patients on ways they can increase areas they are deficient in.  Education Outcome: Acknowledges education/In group clarification offered  Clinical Observations/Feedback: Patient completed activity by writing at least 1 item in each category. Patient did not contribute to group discussion.  Jacquelynn Cree, LRT/CTRS 05/02/2015 4:21 PM

## 2015-05-02 NOTE — Progress Notes (Signed)
Recreation Therapy Notes  INPATIENT RECREATION THERAPY ASSESSMENT  Patient Details Name: Chase Hampton MRN: [id number] DOB: 01-09-1991 Today's Date: 05/02/2015  Patient Stressors: Relationship, Work, Other (Comment) (Ex-fiance is trying to put him in jail every chance she gets; legal issues)  Coping Skills:   Isolate, Arguments, Exercise, Talking, Music, Sports, Other (Comment) (Play with dog, reflecting)  Personal Challenges: Anger, Concentration, Relationships, Self-Esteem/Confidence, Stress Management, Time Management, Work Nutritional therapist (2+):  [employer] - Therapist, music, Individual - Other (Comment) (Go to concerts; skiing)  Awareness of Community Resources:  Yes  Community Resources:  [employer], [employer]  Current Use: Yes  If no, Barriers?:    Patient Strengths:  Counselling psychologist, caring  Patient Identified Areas of Improvement:  Everything, stress management, implusivity  Current Recreation Participation:  Playing with dog, watching movies  Patient Goal for Hospitalization:  To get stabilized and get further answers  [place] of Residence:  [place] of Residence:  [place]   Current SI (including self-harm):  No  Current HI:  No  Consent to Intern Participation: N/A   Jacquelynn Cree, LRT/CTRS 05/02/2015, 2:16 PM

## 2015-05-02 NOTE — Plan of Care (Signed)
Problem: Aggression Towards others,Towards Self, and or Destruction Goal: LTG - No aggression,physical/verbal/destruction prior to D/C (Patient will have no episodes of physical or verbal aggression or property destruction towards self or others for _____ day (s) prior to discharge.)  Outcome: Progressing No destruction of property, no physical or verbal aggression.

## 2015-05-02 NOTE — Plan of Care (Signed)
Problem: Ineffective individual coping Goal: LTG: Patient will report a decrease in negative feelings Outcome: Not Progressing Looks sad and depressed.  Denies SI.  Refuses to discuss why he is here.

## 2015-05-02 NOTE — [facility] Group Notes (Signed)
[facility] LCSW Group Therapy  05/02/2015 3:31 PM  Type of Therapy:  Group Therapy  Participation Level:  Did Not Attend  Summary of Progress/Problems:Patient was called to group but did not attend.  Lulu Riding, MSW, LCSWA  05/02/2015, 3:31 PM

## 2015-05-02 NOTE — [facility] Group Notes (Signed)
[facility] LCSW Aftercare Discharge Planning Group Note   05/02/2015 12:38 PM  Participation Quality:  Patient was called to group but did not attend   Lulu Riding, MSW, LCSWA

## 2015-05-02 NOTE — Progress Notes (Signed)
D: Pt denies SI/HI/AVH. Pt is pleasant and cooperative. Pt appears anxious and restless, was medicated per standing order and he felt better.   A: Pt was offered support and encouragement. Pt was given scheduled medications. Pt was encouraged to attend groups. Q 15 minute checks were done for safety.  R:Pt attends group; super bowl and interacts well with peers and staff. Pt is taking medication. Pt has no complaints.Pt receptive to treatment and safety maintained on unit.

## 2015-05-02 NOTE — Progress Notes (Signed)
[facility] MD Progress Note  05/02/2015 1:28 PM AMRITPAL SHROPSHIRE  MRN:  [id number] Subjective:  Patient reports feeling better since he was transferred from the emergency department to our unit. He feels hopeful that things are going to be better for him. He however had an episode of anxiety yesterday when I ask him to explain to me what triggered it he told  his parents came to visit him on Sunday night. His parents told him that ex-girlfriend had changed her phone number. After that patient called his ex-girlfriend to see if she actually had changed the number. Unfortunately for him she picked up the phone. This will be another violation of the restraining order.  Patient tells me he is not suicidal today, say he has not been suicidal in several days. He denies any homicidal ideation. Denies any psychotic symptoms. Today there is no evidence of hypomania or mania. He denies side effects from medications. He denies any physical complaints.  As he once again violated the restraining order his disposition/plan might change. The original plan was for him to leave tomorrow with an ankle monitor. Now new legal repercussions are unknown.   Per nursing:  D: Pt denies SI/HI/AVH. Pt is pleasant and cooperative. Pt appears anxious and restless, was medicated per standing order and he felt better.  A: Pt was offered support and encouragement. Pt was given scheduled medications. Pt was encouraged to attend groups. Q 15 minute checks were done for safety.  R:Pt attends group; super bowl and interacts well with peers and staff. Pt is taking medication. Pt has no complaints.Pt receptive to treatment and safety maintained on unit.  Principal Problem: Major depressive disorder, recurrent episode, moderate (HCC) Diagnosis:   Patient Active Problem List   Diagnosis Date Noted  . Major depressive disorder, recurrent episode, moderate (HCC) [F33.1] 04/30/2015   Total Time spent with patient: 30 minutes   Sleep:  Good  Appetite:  Good   Past Medical History: Patient is overweight and has been told by his doctor in the past that he was on the borderline for diabetes but has no diagnosed medical problems. No current medication. Patient reports history of multiple concussions while playing football.  States during college he had a shoulder injury which limited his ability to do sports. Past Medical History  Diagnosis Date  . GERD (gastroesophageal reflux disease)   . Anxiety   . Depression     Past Surgical History  Procedure Laterality Date  . No past surgeries     Family History:  Family History  Problem Relation Age of Onset  . Heart disease Maternal Grandfather   . Diabetes Maternal Grandfather   . Heart disease Paternal Grandmother    Family Psychiatric History:mother takes cymbalta for depression. No family history of other mental illness or suicide.  Social History: Single, never married, no children. Born and raised in [place]. Has 2 siblings. Currently living with his parents and siblings in [place]. 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 He currently works for [employer] in [place].  No prior legal history before December of 2016. Currently he has multiple charges for violating restraining order several times. Court order in place stating when d/c from psychiatry he would need an ankle bracelet. History  Alcohol Use No    History  Drug Use  . Yes  . Special: Marijuana    Allergies:  Allergies  Allergen Reactions  . Haldol [Haloperidol Lactate] Other (  See Comments)         Current Medications: Current Facility-Administered Medications  Medication Dose Route Frequency Provider Last Rate Last Dose  . acetaminophen (TYLENOL) tablet 650 mg  650 mg Oral Q6H PRN Audery Amel, MD      . asenapine (SAPHRIS) sublingual tablet 5 mg  5 mg  Sublingual QHS Audery Amel, MD   5 mg at 05/01/15 2242  . divalproex (DEPAKOTE ER) 24 hr tablet 1,000 mg  1,000 mg Oral QHS Jimmy Footman, MD      . DULoxetine (CYMBALTA) DR capsule 40 mg  40 mg Oral Daily Jimmy Footman, MD   40 mg at 05/02/15 1021  . LORazepam (ATIVAN) tablet 1 mg  1 mg Oral TID PRN Audery Amel, MD   1 mg at 05/01/15 2104  . magnesium hydroxide (MILK OF MAGNESIA) suspension 30 mL  30 mL Oral Daily PRN Audery Amel, MD      . meloxicam (MOBIC) tablet 15 mg  15 mg Oral Daily Audery Amel, MD   15 mg at 05/02/15 1021    Lab Results: No results found for this or any previous visit (from the past 48 hour(s)).  Physical Findings: AIMS: Facial and Oral Movements Muscles of Facial Expression: None, normal Lips and Perioral Area: None, normal Jaw: None, normal Tongue: None, normal,Extremity Movements Upper (arms, wrists, hands, fingers): None, normal Lower (legs, knees, ankles, toes): None, normal, Trunk Movements Neck, shoulders, hips: None, normal, Overall Severity Severity of abnormal movements (highest score from questions above): None, normal Incapacitation due to abnormal movements: None, normal Patient's awareness of abnormal movements (rate only patient's report): No Awareness, Dental Status Current problems with teeth and/or dentures?: No Does patient usually wear dentures?: No  CIWA:    COWS:     Musculoskeletal: Strength & Muscle Tone: within normal limits Gait & Station: normal Patient leans: N/A  Psychiatric Specialty Exam: Review of Systems  Constitutional: Negative.   HENT: Negative.   Eyes: Negative.   Respiratory: Negative.   Cardiovascular: Negative.   Gastrointestinal: Negative.   Musculoskeletal: Negative.   Skin: Negative.   Neurological: Negative.   Endo/Heme/Allergies: Negative.   Psychiatric/Behavioral: Positive for depression. Negative for suicidal ideas, hallucinations, memory loss and substance abuse.  The patient is not nervous/anxious and does not have insomnia.     Blood pressure 115/79, pulse 84, temperature 98 F (36.7 C), temperature source Oral, resp. rate 20, height  (1.753 m), weight 122.925 kg (271 lb).Body mass index is 40 kg/(m^2).  General Appearance: Fairly Groomed  Patent attorney::  Good  Speech:  Clear and Coherent  Volume:  Normal  Mood:  Dysphoric  Affect:  Appropriate  Thought Process:  Linear and Logical  Orientation:  Full (Time, Place, and Person)  Thought Content:  Hallucinations: None  Suicidal Thoughts:  No  Homicidal Thoughts:  No  Memory:  Immediate;   Good Recent;   Good Remote;   Good  Judgement:  Fair  Insight:  Fair  Psychomotor Activity:  Normal  Concentration:  Good  Recall:  Good  Fund of Knowledge:Good  Language: Good  Akathisia:  No  Handed:    AIMS (if indicated):     Assets:  Communication Skills Desire for Improvement Financial Resources/Insurance Housing Physical Health Social Support Talents/Skills Transportation Vocational/Educational  ADL's:  Intact  Cognition: WNL  Sleep:  Number of Hours: 5.75   Treatment Plan Summary: Daily contact with patient to assess and evaluate symptoms and progress in treatment  and Medication management   25 y/o wm with 4 hospitalizations since Nov 2016 for suicidality and homicidality. No known prior h/o psychiatric problems or criminal behavior until his girlfriend of 63 y/o ended their relationship and canceled their wedding. Since then pt has accumulated a multitude of charges for violating restraining order filed by his ex.  MDD: patient has been on prozac since December. Reports no significant benefit with fluoxetine. Medication was d/c in the ER. Since it was d/c mood has worsen. Pt would like to restart an antidepressant. Says his mother takes cymbalta with good response. Discussed SE, pt aggress with a trial of cymbalta.Continue Cymbalata 40 mg po q day.  Irritability-explosive  episodes: continue depakote ER 1000 mg po qhs and saphris 5 mg qhs. Off label use---pt understands. Pt and family voicing concerns for possible brain injury----Brain MRI wnl  Insomnia: continue ativan qhs prn  Agitation: continue ativan prn.  Personality d/o: Psychology was consulted and patient is completed MMPI  Neuropathic pain: Patient reports some tingling and numbness on the side of his hands that has been the result of being put in handcuffs back in December. Patient is currently on Cymbalta and is on Mobic  Metabolic syndrome screening: HbA1c 5.2, lipid panel mildly increased LDL  Prolactin levels: will order today  Labs: TSH wnl. Will order a Depakote level and prolactin level on Monday night.  Imaging: MRI of brain----  WNL  Hospitalization status: will change admission to voluntary status.  Precautions q 15 minutes  Diet regular  VS: q day  Dispo: will d/c home with ankle monitoring as requested by [place] court. However he might have new charges and he is disposition might be different.  F/u: will continue F/u with [facility] and Therapist Ramond Dial. Has f/u with neurology at [facility].  Plan to obtain collateral from family and records from [facility] hospitalization--records have been requested   Jimmy Footman, MD 05/02/2015, 1:28 PM

## 2015-05-03 LAB — VALPROIC ACID LEVEL: VALPROIC ACID LVL: 21 ug/mL — AB (ref 50.0–100.0)

## 2015-05-03 MED ORDER — ALUM & MAG HYDROXIDE-SIMETH 200-200-20 MG/5ML PO SUSP: 30.0000 mL | ORAL | Status: DC | PRN

## 2015-05-03 MED ORDER — CLONAZEPAM 0.5 MG PO TABS
0.5000 mg | ORAL_TABLET | Freq: Every day | ORAL | Status: DC
  Administered 2015-05-03: 0.5 mg via ORAL
  Filled 2015-05-03: qty 1

## 2015-05-03 MED ORDER — CLONAZEPAM 0.5 MG PO TABS
0.5000 mg | ORAL_TABLET | Freq: Three times a day (TID) | ORAL | Status: DC | PRN
  Administered 2015-05-03: 0.5 mg via ORAL
  Filled 2015-05-03: qty 1

## 2015-05-03 NOTE — [facility] Group Notes (Signed)
[facility] Group Notes:  (Nursing/MHT/Case Management/Adjunct)  Date:  05/03/2015  Time:  3:58 AM  Type of Therapy:  Group Therapy  Participation Level:  Active  Participation Quality:  Appropriate  Affect:  Appropriate  Cognitive:  Appropriate  Insight:  Appropriate  Engagement in Group:  Engaged  Modes of Intervention:  n/a  Summary of Progress/Problems:  Chase Hampton 05/03/2015, 3:58 AM

## 2015-05-03 NOTE — Tx Team (Signed)
Interdisciplinary Treatment Plan Update (Adult)  Date:  05/03/2015 Time Reviewed:  5:26 PM  Progress in Treatment: Attending groups: No  Participating in groups: No Taking medication as prescribed: Yes. Tolerating medication: Yes. Family/Significant othe contact made: Yes, parents Patient understands diagnosis: Yes. Discussing patient identified problems/goals with staff: Yes. Medical problems stabilized or resolved: Yes. Denies suicidal/homicidal ideation: Yes. Issues/concerns per patient self-inventory: No. Other:  New problem(s) identified: No, Describe: NA  Discharge Plan or Barriers: Unclear at this time due to legal problems, but plans to return home and follow up with outpatient services for medication management and therapy  Reason for Continuation of Hospitalization: Anxiety Depression Medication stabilization Suicidal ideation  Comments: Patient reports feeling very anxious and sad as he is unsure as to what's going to happen with him once he leaves the hospital. He fears that he might have to go to jail as he violated the restraining order on Sunday evening when he called his girlfriend from here. Patient says his parents are meeting with his lawyer today to figure out what will be the legal repercussions.  Patient tells me he is not suicidal today, say he has not been suicidal in several days. However he feels that he could become suicidal if he is faced with going back to jail. He denies any homicidal ideation. Denies any psychotic symptoms. Today there is no evidence of hypomania or mania. He denies side effects from medications. He denies any physical complaints.  As he once again violated the restraining order his disposition/plan might change. The original plan was for him to leave today with an ankle monitor. Now new legal repercussions are unknown. We will be holding a family meeting tomorrow with the parents in order to formulate a discharge plan.  The  patient completed MMPI today. 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   Estimated date of discharge: 3-5 days  New goal(s): NA  Review of initial/current patient goals per problem list:  1. Goal(s): Patient will participate in aftercare plan  Met:NO  Target date: at discharge  As evidenced by: Patient will participate within aftercare plan AEB aftercare provider and housing plan at discharge being identified.  05/03/2015 -Plan in process pending understanding of legal restrictions.  2. Goal (s): Patient will exhibit decreased depressive symptoms and suicidal ideations.  Met:No  Target date: at discharge  As evidenced by: Patient will utilize self rating of depression at 3 or below and demonstrate decreased signs of depression or be deemed stable for discharge by MD.  05/03/2015: Goal progressing  3. Goal(s): Patient will demonstrate decreased signs and symptoms of anxiety.  Met:No  Target date: at discharge  As evidenced by: Patient will utilize self rating of anxiety at 3 or below and demonstrated decreased signs of anxiety, or be deemed stable for discharge by MD  05/03/2015: Goal progressing  Scribe for Treatment Team:   August Saucer, 05/03/2015, 5:26 PM, MSW, LCSW

## 2015-05-03 NOTE — Progress Notes (Signed)
Recreation Therapy Notes  Date: 02.07.17 Time: 3:00 pm Location: Craft Room  Group Topic: Self-expression  Goal Area(s) Addresses:  Patient will be able to identify a color that represents each emotion. Patient will verbalize benefit of using art as a means of self-expression. Patient will verbalize one emotion experienced while participating in activity.  Behavioral Response: Attentive, Interactive  Intervention: The Colors Within Me  Activity: Patients were given a blank face worksheet and instructed to analyze the emotions they were experiencing, pick a color for each emotion, and show on the face how much of that emotion they were experiencing.  Education: LRT educated patients on different forms of self-expression.  Education Outcome: Acknowledges education/In group clarification offered  Clinical Observations/Feedback: Patient completed activity by picking colors for each emotion he is experiencing and showing on the worksheet how much of that emotion he was experiencing. Patient contributed to group discussion by stating what emotions he was feeling.  Jacquelynn Cree, LRT/CTRS 05/03/2015 4:11 PM

## 2015-05-03 NOTE — Plan of Care (Signed)
Problem: Ineffective individual coping Goal: STG: Patient will remain free from self harm Outcome: Progressing Pt remains free from harm  Problem: Aggression Towards others,Towards Self, and or Destruction Goal: STG-Patient will comply with prescribed medication regimen (Patient will comply with prescribed medication regimen)  Outcome: Progressing Pt taking medications as prescribed     

## 2015-05-03 NOTE — Progress Notes (Signed)
D: Pt is pleasant and cooperative this evening. He is seen in the milieu interacting with peers. Denies SI/HI/AVH at this time. Denies pain. Pt requests PRN medication for anxiety with evening medications. Pt does not elaborate on what brought him here or what he would like to work on during this admission. A: Emotional support and encouragement provided. Medications administered as prescribed. q15 minute safety checks maintained. R: Pt remains free from harm.

## 2015-05-03 NOTE — Progress Notes (Signed)
[facility] MD Progress Note  05/03/2015 2:16 PM OAKLAN PERSONS  MRN:  [id number] Subjective:  Patient reports feeling very anxious and sad as he is unsure as to what's going to happen with him once he leaves the hospital. He fears that he might have to go to jail as he violated the restraining order on Sunday evening when he called his girlfriend from here.  Patient says his parents are meeting with his lawyer today to figure out what will be the legal repercussions.  Patient tells me he is not suicidal today, say he has not been suicidal in several days. However he feels that he could become suicidal if he is faced with going back to jail. He denies any homicidal ideation. Denies any psychotic symptoms. Today there is no evidence of hypomania or mania. He denies side effects from medications. He denies any physical complaints.  As he once again violated the restraining order his disposition/plan might change. The original plan was for him to leave today with an ankle monitor. Now new legal repercussions are unknown.  We will be holding a family meeting tomorrow with the parents in order to formulate a discharge plan.  The patient completed MMPI today. 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   Per nursing: D: Pt is pleasant and cooperative this evening. He is seen in the milieu interacting with peers. Denies SI/HI/AVH at this time. Denies pain. Pt requests PRN medication for anxiety with evening medications. Pt does not elaborate on what brought him here or what he would like to work on during this admission. A: Emotional support and encouragement provided. Medications administered as prescribed. q15 minute safety checks maintained. R: Pt remains free from harm.  Principal Problem: Major depressive disorder, recurrent episode, moderate (HCC) Diagnosis:   Patient Active Problem List   Diagnosis Date Noted  . Major depressive disorder, recurrent episode, moderate  (HCC) [F33.1] 04/30/2015   Total Time spent with patient: 30 minutes   Sleep: Good  Appetite:  Good   Past Medical History: Patient is overweight and has been told by his doctor in the past that he was on the borderline for diabetes but has no diagnosed medical problems. No current medication. Patient reports history of multiple concussions while playing football.  States during college he had a shoulder injury which limited his ability to do sports. Past Medical History  Diagnosis Date  . GERD (gastroesophageal reflux disease)   . Anxiety   . Depression     Past Surgical History  Procedure Laterality Date  . No past surgeries     Family History:  Family History  Problem Relation Age of Onset  . Heart disease Maternal Grandfather   . Diabetes Maternal Grandfather   . Heart disease Paternal Grandmother    Family Psychiatric History:mother takes cymbalta for depression. No family history of other mental illness or suicide.  Social History: Single, never married, no children. Born and raised in [place]. Has 2 siblings. Currently living with his parents and siblings in [place]. 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 He currently works for [employer] in [place].  No prior legal history before December of 2016. Currently he has multiple charges for violating restraining order several times. Court order in place stating when d/c from psychiatry he would need an ankle bracelet. History  Alcohol Use No    History  Drug Use  . Yes  . Special: Marijuana  Allergies:  Allergies  Allergen Reactions  . Haldol [Haloperidol Lactate] Other (See Comments)         Current Medications: Current Facility-Administered Medications  Medication Dose Route Frequency Provider Last Rate Last Dose  . acetaminophen (TYLENOL) tablet 650 mg  650 mg Oral Q6H PRN  Audery Amel, MD      . asenapine (SAPHRIS) sublingual tablet 5 mg  5 mg Sublingual QHS Audery Amel, MD   5 mg at 05/02/15 2143  . divalproex (DEPAKOTE ER) 24 hr tablet 1,000 mg  1,000 mg Oral QHS Jimmy Footman, MD   1,000 mg at 05/02/15 2143  . DULoxetine (CYMBALTA) DR capsule 40 mg  40 mg Oral Daily Jimmy Footman, MD   40 mg at 05/03/15 1037  . LORazepam (ATIVAN) tablet 1 mg  1 mg Oral TID PRN Audery Amel, MD   1 mg at 05/02/15 2143  . magnesium hydroxide (MILK OF MAGNESIA) suspension 30 mL  30 mL Oral Daily PRN Audery Amel, MD      . meloxicam (MOBIC) tablet 15 mg  15 mg Oral Daily Audery Amel, MD   15 mg at 05/03/15 1037    Lab Results: No results found for this or any previous visit (from the past 48 hour(s)).  Physical Findings: AIMS: Facial and Oral Movements Muscles of Facial Expression: None, normal Lips and Perioral Area: None, normal Jaw: None, normal Tongue: None, normal,Extremity Movements Upper (arms, wrists, hands, fingers): None, normal Lower (legs, knees, ankles, toes): None, normal, Trunk Movements Neck, shoulders, hips: None, normal, Overall Severity Severity of abnormal movements (highest score from questions above): None, normal Incapacitation due to abnormal movements: None, normal Patient's awareness of abnormal movements (rate only patient's report): No Awareness, Dental Status Current problems with teeth and/or dentures?: No Does patient usually wear dentures?: No  CIWA:    COWS:     Musculoskeletal: Strength & Muscle Tone: within normal limits Gait & Station: normal Patient leans: N/A  Psychiatric Specialty Exam: Review of Systems  Constitutional: Negative.   HENT: Negative.   Eyes: Negative.   Respiratory: Negative.   Cardiovascular: Negative.   Gastrointestinal: Negative.   Musculoskeletal: Negative.   Skin: Negative.   Neurological: Negative.   Endo/Heme/Allergies: Negative.   Psychiatric/Behavioral:  Positive for depression. Negative for suicidal ideas, hallucinations, memory loss and substance abuse. The patient is not nervous/anxious and does not have insomnia.     Blood pressure 133/76, pulse 72, temperature 98.2 F (36.8 C), temperature source Oral, resp. rate 20, height 5\' 9"  (1.753 m), weight 122.925 kg (271 lb).Body mass index is 40 kg/(m^2).  General Appearance: Fairly Groomed  Patent attorney::  Good  Speech:  Clear and Coherent  Volume:  Normal  Mood:  Dysphoric  Affect:  Appropriate  Thought Process:  Linear and Logical  Orientation:  Full (Time, Place, and Person)  Thought Content:  Hallucinations: None  Suicidal Thoughts:  No  Homicidal Thoughts:  No  Memory:  Immediate;   Good Recent;   Good Remote;   Good  Judgement:  Fair  Insight:  Fair  Psychomotor Activity:  Normal  Concentration:  Good  Recall:  Good  Fund of Knowledge:Good  Language: Good  Akathisia:  No  Handed:    AIMS (if indicated):     Assets:  Communication Skills Desire for Improvement Financial Resources/Insurance Housing Physical Health Social Support Talents/Skills Transportation Vocational/Educational  ADL's:  Intact  Cognition: WNL  Sleep:  Number of Hours: 6.75   Treatment  Plan Summary: Daily contact with patient to assess and evaluate symptoms and progress in treatment and Medication management   25 y/o wm with 4 hospitalizations since Nov 2016 for suicidality and homicidality. No known prior h/o psychiatric problems or criminal behavior until his girlfriend of 11 y/o ended their relationship and canceled their wedding. Since then pt has accumulated a multitude of charges for violating restraining order filed by his ex.  MDD: patient has been on prozac since December. Reports no significant benefit with fluoxetine. Medication was d/c in the ER. Since it was d/c mood has worsen. Pt would like to restart an antidepressant. Says his mother takes cymbalta with good response. Discussed  SE, pt aggress with a trial of cymbalta.Continue Cymbalata 40 mg po q day.  Plan to increase Cymbalta to 60 mg in the next few days.  Irritability-explosive episodes: continue depakote ER 1000 mg po qhs and saphris 5 mg qhs. Off label use---pt understands. Pt and family voicing concerns for possible brain injury----Brain MRI wnl  Insomnia: I plan to change Ativan to clonazepam 0.5 mg by mouth daily at bedtime  Agitation: I will change Ativan to clonazepam 0.5 mg by mouth 3 times a day as needed.  Personality d/o: Psychology was consulted.  Patient completed MMPI. MMPI shows cluster B personality traits.  Neuropathic pain: Patient reports some tingling and numbness on the side of his hands that has been the result of being put in handcuffs back in December. Patient is currently on Cymbalta and is on Mobic  Metabolic syndrome screening: HbA1c 5.2, lipid panel mildly increased LDL  Prolactin levels: will order again today  Labs: TSH wnl. Depakote level and prolactin level were not checked last night. Will order these tests again this evening  Imaging: MRI of brain----  WNL  Hospitalization status: will change admission to voluntary status.  Precautions q 15 minutes  Diet regular  VS: q day  Dispo: will d/c home with ankle monitoring as requested by [place] court. However he might have new charges and he is disposition might be different.  Family meeting will be held tomorrow  F/u: will continue F/u with [facility] and Therapist Ramond Dial. Has f/u with neurology at [facility].  Plan to obtain collateral from family and records from [facility] hospitalization--records have been requested.  Records are still pending   Jimmy Footman, MD 05/03/2015, 2:16 PM

## 2015-05-03 NOTE — [facility] Group Notes (Signed)
[facility] Group Notes:  (Nursing/MHT/Case Management/Adjunct)  Date:  05/03/2015  Time:  2:05 PM  Type of Therapy:  Psychoeducational Skills  Participation Level:  Minimal  Participation Quality:  Attentive  Affect:  Flat  Cognitive:  Appropriate  Insight:  Appropriate  Engagement in Group:  Limited  Modes of Intervention:  Discussion and Education  Summary of Progress/Problems:  Marquette Old 05/03/2015, 2:05 PM

## 2015-05-03 NOTE — Progress Notes (Signed)
Support and encouragement offered.  Safety maintained.   Denies SI/HI/AVH.  Medicated x 1 for anxiety with good results.

## 2015-05-03 NOTE — Plan of Care (Signed)
Problem: [facility] Participation in Recreation Therapeutic Interventions Goal: STG-Patient will identify at least five coping skills for ** STG: Coping Skills - Within 4 treatment sessions, patient will verbalize at least 5 coping skills for anger in each of 2 treatment sessions to increase anger management skills post d/c.  Outcome: Progressing Treatment Session 1; Completed 1 out of 2: At approximately 9:40 am, LRT met with patient in craft room. Patient verbalized 5 coping skills for anger. Patient verbalized what triggers him to get angry, how his body responds to anger, and how he is going to remind himself to use his healthy coping skills. LRT provided suggestions as well. Intervention Used: Coping Skills worksheet  Leonette Monarch, LRT/CTRS 02.07.17 11:39 am Goal: STG-Other Recreation Therapy Goal (Specify) STG: Stress Management - Within 4 treatment sessions, patient will verbalize understanding of the stress management techniques in each of 2 treatment sessions to increase stress management skills post d/c.  Outcome: Progressing Treatment Session 1; Completed 1 out of 2: At approximately 9:40 am, LRT met with patient in craft room. LRT educated and provided patient with a handout on a stress management technique. Patient verbalized understanding. LRT encouraged patient to read over and practice the stress management technique. Intervention Used: Stress Management handout  Leonette Monarch, LRT/CTRS 02.07.17 11:40 am

## 2015-05-03 NOTE — Plan of Care (Signed)
Problem: Aggression Towards others,Towards Self, and or Destruction Goal: LTG - No aggression,physical/verbal/destruction prior to D/C (Patient will have no episodes of physical or verbal aggression or property destruction towards self or others for _____ day (s) prior to discharge.)  Outcome: Progressing No aggressive behaviors noted.  Pt is quiet and reserved.  Stays to himself.
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
## 2015-05-03 NOTE — [facility] Group Notes (Signed)
[facility] Group Notes:  (Nursing/MHT/Case Management/Adjunct)  Date:  05/03/2015  Time:  10:16 PM  Type of Therapy:  Evening Wrap-up Group  Participation Level:  Active  Participation Quality:  Appropriate  Affect:  Blunted  Cognitive:  Appropriate  Insight:  Good  Engagement in Group:  Developing/Improving  Modes of Intervention:  Discussion  Summary of Progress/Problems: Pt. Stated he wanted to work on his anger and anxiety.  Shira Bobst Nanta Sha Burling 05/03/2015, 10:16 PM

## 2015-05-03 NOTE — Consult Note (Signed)
  Reviewed results of testing with Mr. Zilka. Answered all questions. No problems or concerns noted.

## 2015-05-03 NOTE — [facility] Group Notes (Signed)
[facility] LCSW Group Therapy  05/03/2015 2:53 PM  Type of Therapy:  Group Therapy  Participation Level:  Did Not Attend  Summary of Progress/Problems: Patient was called to group but did not attend.  Lulu Riding, MSW, LCSWA 05/03/2015, 2:53 PM

## 2015-05-04 MED ORDER — DULOXETINE HCL 60 MG PO CPEP: 60.0000 mg | ORAL_CAPSULE | Freq: Every day | ORAL | Status: DC

## 2015-05-04 MED ORDER — MELOXICAM 15 MG PO TABS: 15.0000 mg | ORAL_TABLET | Freq: Every day | ORAL | Status: DC

## 2015-05-04 MED ORDER — DIVALPROEX SODIUM ER 500 MG PO TB24: 1000.0000 mg | ORAL_TABLET | Freq: Every day | ORAL | Status: DC

## 2015-05-04 MED ORDER — LORAZEPAM 2 MG/ML IJ SOLN
INTRAMUSCULAR | Status: AC
  Administered 2015-05-04: 12:00:00
  Filled 2015-05-04: qty 1

## 2015-05-04 MED ORDER — DIPHENHYDRAMINE HCL 50 MG/ML IJ SOLN
INTRAMUSCULAR | Status: AC
  Administered 2015-05-04: 12:00:00
  Filled 2015-05-04: qty 1

## 2015-05-04 MED ORDER — LORAZEPAM 2 MG/ML IJ SOLN: 2.0000 mg | Freq: Once | INTRAMUSCULAR | Status: DC

## 2015-05-04 MED ORDER — ASENAPINE MALEATE 5 MG SL SUBL: 5.0000 mg | SUBLINGUAL_TABLET | Freq: Every day | SUBLINGUAL | Status: DC

## 2015-05-04 MED ORDER — DIPHENHYDRAMINE HCL 50 MG/ML IJ SOLN
INTRAMUSCULAR | Status: AC
  Administered 2015-05-04: 13:00:00
  Filled 2015-05-04: qty 1

## 2015-05-04 MED ORDER — FLUPHENAZINE HCL 2.5 MG/ML IJ SOLN
10.0000 mg | Freq: Once | INTRAMUSCULAR | Status: DC
  Administered 2015-05-04: 10 mg via INTRAMUSCULAR

## 2015-05-04 MED ORDER — DIPHENHYDRAMINE HCL 50 MG/ML IJ SOLN: 50.0000 mg | Freq: Once | INTRAMUSCULAR | Status: DC

## 2015-05-04 MED ORDER — CLONAZEPAM 0.5 MG PO TABS: 0.5000 mg | ORAL_TABLET | Freq: Every day | ORAL | Status: DC

## 2015-05-04 MED ORDER — DULOXETINE HCL 40 MG PO CPEP: 60.0000 mg | ORAL_CAPSULE | Freq: Every day | ORAL | Status: DC

## 2015-05-04 MED ORDER — CLONAZEPAM 0.5 MG PO TABS: 0.5000 mg | ORAL_TABLET | Freq: Three times a day (TID) | ORAL | Status: DC | PRN

## 2015-05-04 NOTE — Progress Notes (Signed)
Patient ID: Chase Hampton, male   DOB: June 07, 1990, 25 y.o.   MRN: [id number] Family Meeting Held 12/02/2015   Patient, both of his parents, this Probation officer, [place] student [name], and Dr. Jerilee Hoh present.Meeting was held from 11 AM to 12 PM. During the meeting the patient's parents reported that because he violated the restraining order on February 5 he no longer has the option of getting an ankle monitor. Patient will have to be discharged to jail when he leaves the psychiatric unit. During the discussion family requested: -Private meeting between the patient and his lawyer here in the unit  -they requested for me to contact the patient's lawyer  -To have a notary come to the unit with and 2 witness for the pt to [place] of Attorney paperwork -Letter for jail detailing need for medications -Follow up with outpatient psychiatrist.  We all agreed to have these request met prior to discharge. We decided to set d/c date for Monday Feb 13. It was clear pt disposition was going to be jail.  After saying goodbye to parents, Pt began throwing objects and breaking things around the room.  SW escorted Pt's parents off of the unit as Pt continued to escalate.  Pt's mother tearful and walked them to waiting room off of the unit.  SW excused self to return to unit in case needed for intervention.  Pt was seen walking voluntarily down the hall to seclusion room followed by Security.    SW returned to parents on the way out to another appointment and let them know that Pt seemed to have calmed down and that we would be in touch if anything changed.    SW later alerted that PT behavior had continued to escalate requiring hold and restraint.  After meeting with treatment team it was determined that it would be most therapeutic for Pt to be discharged to local Police as Pt behaviors related to Antisocial traits and an effort to extend his stay at hospital instead of going to jail.  [name],  MSW, LCSW

## 2015-05-04 NOTE — Discharge Summary (Addendum)
Physician Discharge Summary Note  Patient:  Chase Hampton is an 25 y.o., male MRN:  [id number] DOB:  05-30-1990 Patient phone:  [phone] (home)  Patient address:   [address]  [place] [address],  Total Time spent with patient: 1 hour  Date of Admission:  04/29/2015 Date of Discharge: 05/04/2015  Reason for Admission:  suicidality  Principal Problem: Major depressive disorder, recurrent episode, moderate ([place]) Discharge Diagnoses: Patient Active Problem List   Diagnosis Date Noted  . Major depressive disorder, recurrent episode, moderate ([place]) [F33.1] 04/30/2015     Past Psychiatric History: no prior psychiatric history before Nov 2016 (no suicidal attempts, never prescribed with any psychotropics).   1st Hospitalization in our unit from 02/23/15 to 03/01/15. "presented to the emergency department voicing worsening depression, thoughts of suicide and some homicidal ideation towards his girlfriend. These worsening of symptoms was triggered after his girlfriend of 5 years broke up with him. Patient reports a long history of anger issues that started in childhood. The patient also has history of multiple concussions due to playing football. Per reports from the patient's family he was holding a gun and threatening to kill himself and her." He was diagnosed with depression and was started on fluoxetine.  2nd Hospitalization in our unit: 03/07/15 to 03/14/15: "presented to our ER on 84/13 under police custody. According to report he got into an altercation with his father and then stated he was getting in his car to go kill his fiance and her family which lives very close by. Police were called. The patient was belligerent and violent." Diagnosed with MDD and d/c on fluoxetine and depakote for irritability and anger.  3rd Hospitalization in [place]: no records available: jump out of his parents car and tried to tie a cord around his neck. Per pt, prozac was increased to 40  mg. Depakote was d/c.  After his second hospitalization he f/u with [facility] in [place] where saphris was added to prozac and depakote. He also f/o with Eugenia Pancoast for therapy.  Past Medical History:  Patient is overweight and has been told by his doctor in the past that he was on the borderline for diabetes but has no diagnosed medical problems. No current medication. Patient reports history of multiple concussions while playing football.  States during college he had a shoulder injury which limited his ability to do sports. Past Medical History  Diagnosis Date  . GERD (gastroesophageal reflux disease)   . Anxiety   . Depression     Past Surgical History  Procedure Laterality Date  . No past surgeries     Family History:  Family History  Problem Relation Age of Onset  . Heart disease Maternal Grandfather   . Diabetes Maternal Grandfather   . Heart disease Paternal Grandmother    Family Psychiatric  History:mother takes cymbalta for depression. No family history of other mental illness or suicide.  Social History: Single, never married, no children. Born and raised in [place]. Has 2 siblings. Currently living with his parents and siblings in [place]. Patient graduated from college in May 2016 he has a Sports administrator in business and Librarian, academic. He currently works for [employer] in [place].  No prior legal history before December of 2016. Currently he has multiple charges for violating restraining order several times. Court order in place stating when d/c from psychiatry he would need an ankle bracelet. History  Alcohol Use No     History  Drug Use  . Yes  .  Special: Marijuana    Social History   Social History  . Marital Status: Single    Spouse Name: N/A  . Number of Children: N/A  . Years of Education: N/A   Social History Main Topics  . Smoking status: Former Smoker    Types: Cigars  . Smokeless tobacco: Never Used  . Alcohol Use: No  .  Drug Use: Yes    Special: Marijuana  . Sexual Activity: No   Other Topics Concern  . None   Social History Narrative    Hospital Course:    25 y/o wm with 4 hospitalizations since Nov 2016 for suicidality and homicidality. No known prior h/o psychiatric problems or criminal behavior until his girlfriend of 51 y/o ended their relationship and canceled their wedding. Since then pt has accumulated a multitude of charges for violating restraining order filed by his ex.  MDD: patient has been on prozac since December. Reports no significant benefit with fluoxetine. Medication was d/c in the ER. Since it was d/c mood has worsen. Pt would like to restart an antidepressant. Says his mother takes cymbalta with good response. Discussed SE, pt aggress with a trial of cymbalta.Patient was restarted on Cymbalta 40 mg by mouth daily on 04/30/15.  He tolerated the Cymbalta well. Today I will increase the dose to 60 mg a day.    Irritability-explosive episodes: continue depakote ER 1000 mg po qhs and saphris 5 mg qhs. Off label use---pt understands. Pt and family voicing concerns for possible brain injury----Brain MRI wnl. No evidence that these explosive episodes are secondary to bipolar disorder (mania-hypomania), psychosis or TBI (nl MRI, no evidence of cognitive deficits).  Pt used terms such as "psychotic episodes" when referring to his episodes of aggression. He also reported being in a "dissociative state" during his whole stay back in [place]. There has been no objective/clinical evidence of psychosis or a dissociative state.    Insomnia: Ativan has been changed to clonazepam 0.5 mg by mouth daily at bedtime.  As pt is concerned that if he goes to jail he will not receive Ativan as it is a controlled substance. In order to prevent withdrawals I will discontinue the Ativan and instead transitioned to a long-acting benzodiazepine.  Agitation:  Ativan has been changed  to clonazepam 0.5 mg by mouth  3 times a day as needed (for same reasons mentioned above)  Personality d/o: Psychology was consulted. Patient completed MMPI. MMPI shows cluster B personality traits.   Neuropathic pain: Patient reports some tingling and numbness on the side of his hands that has been the result of being put in handcuffs back in December. Patient is currently on Cymbalta and is on Mobic  Metabolic syndrome screening: HbA1c 5.2, lipid panel mildly increased LDL  Prolactin levels: pending  Labs: TSH wnl. Depakote level and prolactin were checked on 05/03/15.   Imaging: MRI of brain---- WNL  Hospitalization status: voluntary  Precautions: during his stay he was on q 15 minutes (routine checks)  Diet regular  VS: q day  Records from [facility] hospitalization--records from [facility] were received today: Patient's discharge diagnoses were major depressive disorder recurrent moderate, rule out malingering, antisocial personality disorder, obesity, GERD.   records: "Initial Mental status exam: The patient states multiple times that he was a stress in college and had a lot of "stress", but when times of stress are brought up as bringing out mental illness he states, "I was never stressed". His stories are incongruent in he changes his story  multiple times as we were talking. I spoke to him about the stressful periods in time been entering college and graduated college and how these might bring about mental illness. I also spoke about how these are times of growth, becoming an adult and making decisions and sometimes we make poor decisions. He then is states that he did not make poor decisions and that he, in fact, was paranoid during these times. He states he believed that lizard  people were living in the ground and were trying to taking over and that he was a clone. As stated before his stories are incongruent. He is oriented to person, place and time. He is neat, clean and cooperative. "  Patient was transferred from the  emergency department to our unit on February 3rd.  He was in the emergency department in a hospital for about 10 days. The psychiatrists in the emergency department made a referral for admission to [facility].   [facility] usually offers a longer hospital stay for patient's, they also is half forensic services and neuropsychologist.  None of these services are available in our facility.  He awaited in the ER for a bed there w/o success.  While in the emergency department he threatened to sue the psychiatrist for violating his rights and mistreatment.    On Feb 3rd: "At approximately 1510, the patient was speaking on the phone and seemed to become increasingly angry. Patient yelled, "Get me the f@!! out of here", slammed the phone to the ground and attempted to throw the chair in the room at the door. He then beat his fists against the door repeatedly and continued to yell and scream that he wanted to leave the hospital. Nurse coaxed patient from outside the door to take Ativan by mouth, which patient refused by yelling that he was not going to take any medicine. When nurse arrived with IM Geodon, patient yelled at staff and security that he would not take the medicine and that they would need "more people than that" to make him take it. At this point, staff informed the patient that he would need to take the medicine per physician's order, and then he reluctantly complied to take the IM medication. Will continue to monitor the patient for increased aggression and safety. Maintained on 15 minute checks and observation by security camera for safety. "  As noted was found at [facility] the patient was admitted to our inpatient psychiatric facility.  On February 4 he was started on cymbalta 40 mg as he reported depressed mood and on and off passive suicidal thoughts.    On February 5  he once again violated the restraining order by contacting his  ex-fiance by phone from the unit.  On Feb 7: "Pt affect is anxious and irritable this evening. Pt is seen on the phone with his parents becoming increasingly agitated. Pt's mother called the nurses station and asked staff to keep an eye on pt so that he doesn't call his ex-fiance. Staff approached pt, who stated "get the f**k away from me, I need to be alone in order to calm down." Pt went to his room. Pt came out of room later in evening apologizing to staff and asking to get his labs drawn and get his evening medications. Pt refused to elaborate on what irritated him this evening. Pt reports passive SI. Pt does contract for safety. Denies HI/AVH at this time."  On Feb 8: Family meeting  was held.  Patient, both of his parents, Verdia Kuba, [place] student [name], and this Probation officer.  Meeting was held from 11 AM to 12 PM.  During the meeting the patient's parents reported that because he violated the restraining order on February 5 he no longer has the option of getting an ankle monitor. Patient will have to be discharged to jail when he leaves the psychiatric unit.  During the discussion family requested: -Private meeting between the patient and his lawyer here in the unit  -they requested for me to contact the patient's lawyer  -To have a notary come to the unit with a health care power of attorney and 2 witness for the pt to sign. -Letter for jail detailing need for medications -F/u with psychiatrist.  We all agreed to have these request met prior to discharge.  We decided to set d/c date for Monday Feb 13.  It was clear pt disposition was going to be jail.  During the meeting the patient was calm, he asked questions appropriately. He was respectful and cooperative.   After the patient said goodbye to his parents.  He suddenly started to grab and throw things around the room (parents were still there). He broke a radio, hand sanitizer dispenser, he flipped 2 tables and chairs.  He broke  a Administrator, arts. He did not respond to redirection by security.  He eventually willingly walked out of the room and went to the quiet room.  IM medications were ordered.  Patient refused and became agitated again. At that point he required manual hold.  He then started banging his head against a seclusion room walls.  He kicked a door and broke the glass from the door.  Another manual hold was needed at that point.  Multiple security officers had to restrain the patient.    Patient was then moved back to seclusion (with door open).  He at that point was calmer.  Dr Bary Leriche evaluted him at that point.  (see second opinion note written by her on Feb 8)  Treatment team met: Nursing staff, unit psychologist, social worker and this Probation officer met. As these aggressive behavior are not occurring in the setting of mania, hypomania, delusions, paranoia or fueled  by hallucinations, we decided to coordinate discharge with local jail.  Staff and myself felt this was an attempt on the patient's part to prolong his hospital stay and prevent his discharge to jail on Monday. Once the patient was calm he was discharged to police custody. Parents were notified.    Patient diagnosis seems to be more consistent with an antisocial personality disorder.  We requested to lieutenant  to have the patient on one-to-one watch due to his high aggressive behavior. Alden Server received this information and he agreed.  Letter will be faxed to jail indicating the patient's diagnosis, major depressive disorder, and need to continue current medications which include Saphris, Depakote and Cymbalta. Prescriptions were given.  Dispo: will d/c from hospital today.  [place] police to pick him up from hospital.  Per police pt will be incarcerated.   F/u: will continue F/u with [facility] and Therapist Eugenia Pancoast. Has f/u with neurology at [facility] (pt is already a establish pt there)  Follow-up has been arranged by our social  worker   Physical Findings: AIMS: Facial and Oral Movements Muscles of Facial Expression: None, normal Lips and Perioral Area: None, normal Jaw: None, normal Tongue: None, normal,Extremity Movements Upper (arms, wrists, hands, fingers): None, normal  Lower (legs, knees, ankles, toes): None, normal, Trunk Movements Neck, shoulders, hips: None, normal, Overall Severity Severity of abnormal movements (highest score from questions above): None, normal Incapacitation due to abnormal movements: None, normal Patient's awareness of abnormal movements (rate only patient's report): No Awareness, Dental Status Current problems with teeth and/or dentures?: No Does patient usually wear dentures?: No  CIWA:    COWS:     Musculoskeletal: Strength & Muscle Tone: within normal limits Gait & Station: normal Patient leans: N/A  Psychiatric Specialty Exam: Review of Systems  Constitutional: Negative.   HENT: Negative.   Eyes: Negative.   Respiratory: Negative.   Cardiovascular: Negative.   Gastrointestinal: Negative.   Genitourinary: Negative.   Musculoskeletal: Negative.   Skin: Negative.   Neurological: Negative.   Endo/Heme/Allergies: Negative.   Psychiatric/Behavioral: Positive for depression. Negative for suicidal ideas, hallucinations, memory loss and substance abuse. The patient is not nervous/anxious and does not have insomnia.     Blood pressure 119/81, pulse 89, temperature 98 F (36.7 C), temperature source Oral, resp. rate 20, height '5\' 9"'  (1.753 m), weight 122.925 kg (271 lb).Body mass index is 40 kg/(m^2).  General Appearance: Fairly Groomed  Engineer, water::  Good  Speech:  Clear and Coherent  Volume:  Normal  Mood:  Dysphoric  Affect:  Appropriate  Thought Process:  Linear and Logical  Orientation:  Full (Time, Place, and Person)  Thought Content:  Hallucinations: None  Suicidal Thoughts:  No  Homicidal Thoughts:  No  Memory:  Immediate;   Good Recent;   Good Remote;    Good  Judgement:  Poor  Insight:  Lacking  Psychomotor Activity:  Normal  Concentration:  Good  Recall:  Good  Fund of Knowledge:Good  Language: Good  Akathisia:  No  Handed:    AIMS (if indicated):     Assets:  Agricultural consultant [place] Talents/Skills  ADL's:  Intact  Cognition: WNL  Sleep:  Number of Hours: 7.5   Have you used any form of tobacco in the last 30 days? (Cigarettes, Smokeless Tobacco, Cigars, and/or Pipes): No  Has this patient used any form of tobacco in the last 30 days? (Cigarettes, Smokeless Tobacco, Cigars, and/or Pipes)  No  Metabolic Disorder Labs:  Lab Results  Component Value Date   HGBA1C 5.2 02/24/2015   No results found for: PROLACTIN Lab Results  Component Value Date   CHOL 167 02/24/2015   TRIG 81 02/24/2015   HDL 41 02/24/2015   CHOLHDL 4.1 02/24/2015   VLDL 16 02/24/2015   LDLCALC 110* 02/24/2015    Results for ETTORE, TREBILCOCK (MRN [id number]) as of 05/04/2015 13:03  Ref. Range 04/19/2015 18:28 04/26/2015 10:13 04/27/2015 07:45 04/30/2015 14:20 05/03/2015 21:54  Sodium Latest Ref Range: 135-145 mmol/L 141      Potassium Latest Ref Range: 3.5-5.1 mmol/L 3.6      Chloride Latest Ref Range: 101-111 mmol/L 104      CO2 Latest Ref Range: 22-32 mmol/L 28      BUN Latest Ref Range: 6-20 mg/dL 17      Creatinine Latest Ref Range: 0.61-1.24 mg/dL 0.96      Calcium Latest Ref Range: 8.9-10.3 mg/dL 9.5      EGFR (Non-African Amer.) Latest Ref Range: >60 mL/min >60      EGFR (African American) Latest Ref Range: >60 mL/min >60      Glucose Latest Ref Range: 65-99 mg/dL 122 (H)      Anion gap Latest Ref  Range: 5-15  9      Alkaline Phosphatase Latest Ref Range: 38-126 U/L 72      Albumin Latest Ref Range: 3.5-5.0 g/dL 4.1      AST Latest Ref Range: 15-41 U/L 16      ALT Latest Ref Range: 17-63 U/L 37      Total Protein Latest Ref Range: 6.5-8.1 g/dL 7.5      Total Bilirubin Latest Ref  Range: 0.3-1.2 mg/dL 0.9      WBC Latest Ref Range: 3.8-10.6 K/uL 9.5      RBC Latest Ref Range: 4.40-5.90 MIL/uL 5.35      Hemoglobin Latest Ref Range: 13.0-18.0 g/dL 15.1      HCT Latest Ref Range: 40.0-52.0 % 44.8      MCV Latest Ref Range: 80.0-100.0 fL 83.7      MCH Latest Ref Range: 26.0-34.0 pg 28.3      MCHC Latest Ref Range: 32.0-36.0 g/dL 33.8      RDW Latest Ref Range: 11.5-14.5 % 13.8      Platelets Latest Ref Range: 150-440 K/uL 188      Valproic Acid,S Latest Ref Range: 50.0-100.0 ug/mL 68 69   21 (L)  Alcohol, Ethyl (B) Latest Ref Range: <5 mg/dL <5      Amphetamines, Ur Screen Latest Ref Range: NONE DETECTED  NONE DETECTED      Barbiturates, Ur Screen Latest Ref Range: NONE DETECTED  NONE DETECTED      Benzodiazepine, Ur Scrn Latest Ref Range: NONE DETECTED  NONE DETECTED      Cocaine Metabolite,Ur [place] Latest Ref Range: NONE DETECTED  NONE DETECTED      Methadone Scn, Ur Latest Ref Range: NONE DETECTED  NONE DETECTED      MDMA (Ecstasy)Ur Screen Latest Ref Range: NONE DETECTED  NONE DETECTED      Cannabinoid 50 Ng, Ur [place] Latest Ref Range: NONE DETECTED  NONE DETECTED      Opiate, Ur Screen Latest Ref Range: NONE DETECTED  NONE DETECTED      Phencyclidine (PCP) Ur S Latest Ref Range: NONE DETECTED  NONE DETECTED      Tricyclic, Ur Screen Latest Ref Range: NONE DETECTED  NONE DETECTED       CLINICAL DATA: Brain injury. History of concussions. Now with aggressive in a pulse of behavior.  EXAM: MRI HEAD WITHOUT AND WITH CONTRAST  TECHNIQUE: Multiplanar, multiecho pulse sequences of the brain and surrounding structures were obtained without and with intravenous contrast.  CONTRAST: 32m MULTIHANCE GADOBENATE DIMEGLUMINE 529 MG/ML IV SOLN  COMPARISON: Head CT 12/10/2009  FINDINGS: There is no evidence of acute infarct, intracranial hemorrhage, mass, midline shift, or extra-axial fluid collection. Ventricles and sulci are normal for age. No brain parenchymal  signal abnormality or abnormal enhanced is identified.  Orbits are unremarkable. A small right maxillary sinus mucous retention cyst is noted no significant mastoid effusion. Major intracranial vascular flow voids are preserved. Calvarium and scalp soft tissues are unremarkable.  IMPRESSION: Unremarkable appearance of the brain.  See Psychiatric Specialty Exam and Suicide Risk Assessment completed by Attending Physician prior to discharge.  Discharge destination:  Other:  jail  Is patient on multiple antipsychotic therapies at discharge:  No   Has Patient had three or more failed trials of antipsychotic monotherapy by history:  No  Recommended Plan for Multiple Antipsychotic Therapies: NA     Medication List    STOP taking these medications        LORazepam 0.5 MG tablet  Commonly known as:  ATIVAN      TAKE these medications      Indication   asenapine 5 MG Subl 24 hr tablet  Commonly known as:  SAPHRIS  Place 1 tablet (5 mg total) under the tongue at bedtime.  Notes to Patient:  aggression      clonazePAM 0.5 MG tablet  Commonly known as:  KLONOPIN  Take 1 tablet (0.5 mg total) by mouth at bedtime.  Notes to Patient:  Anxiety-insomnia      clonazePAM 0.5 MG tablet  Commonly known as:  KLONOPIN  Take 1 tablet (0.5 mg total) by mouth 3 (three) times daily as needed (anxiety).  Notes to Patient:  anxiety      divalproex 500 MG 24 hr tablet  Commonly known as:  DEPAKOTE ER  Take 2 tablets (1,000 mg total) by mouth at bedtime.  Notes to Patient:  irritability      DULoxetine 60 MG capsule  Commonly known as:  CYMBALTA  Take 1 capsule (60 mg total) by mouth daily.  Notes to Patient:  Depression and anxiety      meloxicam 15 MG tablet  Commonly known as:  MOBIC  Take 1 tablet (15 mg total) by mouth daily.  Notes to Patient:  pain   Indication:  joint pain       Follow-up Information    Follow up with [facility]. Go on 05/10/2015.   Why:   11AM   Contact information:   [place] [phone]      Follow-up recommendations:  Other:  will request 1:1 watch while in jail for high aggression.  >30 minutes  Signed: Hildred Priest, MD 05/04/2015, 2:49 PM

## 2015-05-04 NOTE — Plan of Care (Signed)
Problem: Ineffective individual coping Goal: STG: Patient will remain free from self harm Outcome: Progressing Pt remains free from harm  Problem: Aggression Towards others,Towards Self, and or Destruction Goal: LTG-Pt initiate timeout/other activity,confront AngerTrigger (Patient will initiate time out or other activity when confronting situations which trigger anger)  Outcome: Progressing Pt agitated this evening. Pt went to room on his own in order to calm down
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
## 2015-05-04 NOTE — Progress Notes (Signed)
D: Pt affect is anxious and irritable this evening. Pt is seen on the phone with his parents becoming increasingly agitated. Pt's mother called the nurses station and asked staff to keep an eye on pt so that he doesn't call his ex-fiance. Staff approached pt, who stated "get the f**k away from me, I need to be alone in order to calm down." Pt went to his room. Pt came out of room later in evening apologizing to staff and asking to get his labs drawn and get his evening medications. Pt refused to elaborate on what irritated him this evening. Pt reports passive SI. Pt does contract for safety. Denies HI/AVH at this time. Denies pain.  A: Emotional support and encouragement provided. Medications administered as prescribed. Pt encouraged to come to staff with any concerns or complaints he may have. q15 minute safety checks maintained. R: Pt remains free from harm.

## 2015-05-04 NOTE — [facility] Suicide Risk Assessment (Addendum)
[facility] Discharge Suicide Risk Assessment   Principal Problem: Major depressive disorder, recurrent episode, moderate (HCC) Discharge Diagnoses:  Patient Active Problem List   Diagnosis Date Noted  . Major depressive disorder, recurrent episode, moderate (HCC) [F33.1] 04/30/2015    Total Time spent with patient: 1 hour  Psychiatric Specialty Exam: ROS  Blood pressure 119/81, pulse 89, temperature 98 F (36.7 C), temperature source Oral, resp. rate 20, height  (1.753 m), weight 122.925 kg (271 lb).Body mass index is 40 kg/(m^2).                                                       Mental Status Per Nursing Assessment::   On Admission:  NA  Demographic Factors:  Male, Adolescent or young adult, Caucasian and single  Loss Factors: Loss of significant relationship and Legal issues  Historical Factors: Prior suicide attempts and Impulsivity  Risk Reduction Factors:   Sense of responsibility to family, Religious beliefs about death, Living with another person, especially a relative and Positive social support  Continued Clinical Symptoms:  Severe Anxiety and/or Agitation Depression:   Aggression Impulsivity  Cognitive Features That Contribute To Risk:  None    Suicide Risk:  Minimal: No identifiable suicidal ideation.  Patients presenting with no risk factors but with morbid ruminations; may be classified as minimal risk based on the severity of the depressive symptoms  Current suicidality is minimal. He has been denying suicidality consisting since Saturday, February 4. He did not display any unsafe or disruptive behaviors in the unit prior to his aggressive outbursts on February 8 that was clearly a trigger after he was told we were going to discharging on Monday to jail. This patient however has a high chronic risk for aggression and suicidality. Patient is currently facing multiple legal charges, he is single, Caucasian, and impulsive, prior  suicidal behavior.Patient also has communicated threats and had consistently violated a restraining order.    Plan Of Care/Follow-up recommendations:  Other:  will request a 1:1 watch while in jail due to high aggression  Jimmy Footman, MD 05/04/2015, 12:36 PM

## 2015-05-04 NOTE — Progress Notes (Signed)
  [facility] Adult Case Management Discharge Plan :  Will you be returning to the same living situation after discharge:  No. At discharge, do you have transportation home?: Yes,  Police Do you have the ability to pay for your medications: Yes,     Release of information consent forms completed and in the chart;  Patient's signature needed at discharge.  Patient to Follow up at: Follow-up Information    Follow up with [facility]. Go on 05/10/2015.   Why:  11AM   Contact information:   [address] [place]      Next level of care provider has access to [facility] and Suicide Prevention discussed: Yes,     Have you used any form of tobacco in the last 30 days? (Cigarettes, Smokeless Tobacco, Cigars, and/or Pipes): No  Has patient been referred to the Quitline?: N/A patient is not a smoker  Patient has been referred for addiction treatment: N/A  Glennon Mac 05/04/2015, 4:50 PM
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
## 2015-05-04 NOTE — [facility] Suicide Risk Assessment (Signed)
[facility] INPATIENT:  Family/Significant Other Suicide Prevention Education  Suicide Prevention Education:  Education Completed; Engineer, water, Mother,  (name of family member/significant other) has been identified by the patient as the family member/significant other with whom the patient will be residing, and identified as the person(s) who will aid the patient in the event of a mental health crisis (suicidal ideations/suicide attempt).  With written consent from the patient, the family member/significant other has been provided the following suicide prevention education, prior to the and/or following the discharge of the patient.  The suicide prevention education provided includes the following:  Suicide risk factors  Suicide prevention and interventions  National Suicide Hotline telephone number  [facility] assessment telephone number  [facility] Emergency Assistance 911  [facility] and/or Residential Mobile Crisis Unit telephone number  Request made of family/significant other to:  Remove weapons (e.g., guns, rifles, knives), all items previously/currently identified as safety concern.    Remove drugs/medications (over-the-counter, prescriptions, illicit drugs), all items previously/currently identified as a safety concern.  The family member/significant other verbalizes understanding of the suicide prevention education information provided.  The family member/significant other agrees to remove the items of safety concern listed above.  Glennon Mac, MSW, LCSW 05/04/2015, 4:50 PM

## 2015-05-04 NOTE — [facility] Group Notes (Signed)
[facility] Group Notes:  (Nursing/MHT/Case Management/Adjunct)  Date:  05/04/2015  Time:  2:03 PM  Type of Therapy:  Psychoeducational Skills  Participation Level:  Did Not Attend  Lynelle Smoke [facility] 05/04/2015, 2:03 PM

## 2015-05-05 LAB — PROLACTIN: PROLACTIN: 8.6 ng/mL (ref 4.0–15.2)

## 2015-05-15 ENCOUNTER — Emergency Department: Payer: BLUE CROSS/BLUE SHIELD

## 2015-05-15 ENCOUNTER — Emergency Department
Admission: EM | Admit: 2015-05-15 | Discharge: 2015-05-15 | Disposition: A | Discharge status: Other Acute Inpatient Hospital | Payer: BLUE CROSS/BLUE SHIELD | Attending: Emergency Medicine | Admitting: Emergency Medicine

## 2015-05-15 ENCOUNTER — Inpatient Hospital Stay (HOSPITAL_COMMUNITY): Payer: BLUE CROSS/BLUE SHIELD

## 2015-05-15 ENCOUNTER — Inpatient Hospital Stay (HOSPITAL_COMMUNITY)
Admission: AD | Admit: 2015-05-15 | Discharge: 2015-06-14 | DRG: 091 | Payer: BLUE CROSS/BLUE SHIELD | Source: Other Acute Inpatient Hospital | Attending: Family Medicine | Admitting: Family Medicine

## 2015-05-15 ENCOUNTER — Encounter: Payer: Self-pay | Admitting: Radiology

## 2015-05-15 DIAGNOSIS — R451 Restlessness and agitation: Secondary | ICD-10-CM | POA: Diagnosis not present

## 2015-05-15 DIAGNOSIS — F319 Bipolar disorder, unspecified: Secondary | ICD-10-CM | POA: Diagnosis not present

## 2015-05-15 DIAGNOSIS — R739 Hyperglycemia, unspecified: Secondary | ICD-10-CM | POA: Diagnosis not present

## 2015-05-15 DIAGNOSIS — F329 Major depressive disorder, single episode, unspecified: Secondary | ICD-10-CM | POA: Diagnosis not present

## 2015-05-15 DIAGNOSIS — Z9289 Personal history of other medical treatment: Secondary | ICD-10-CM

## 2015-05-15 DIAGNOSIS — Z87891 Personal history of nicotine dependence: Secondary | ICD-10-CM

## 2015-05-15 DIAGNOSIS — F061 Catatonic disorder due to known physiological condition: Secondary | ICD-10-CM | POA: Diagnosis present

## 2015-05-15 DIAGNOSIS — F639 Impulse disorder, unspecified: Secondary | ICD-10-CM | POA: Diagnosis not present

## 2015-05-15 DIAGNOSIS — G40901 Epilepsy, unspecified, not intractable, with status epilepticus: Secondary | ICD-10-CM | POA: Diagnosis present

## 2015-05-15 DIAGNOSIS — R509 Fever, unspecified: Secondary | ICD-10-CM | POA: Diagnosis not present

## 2015-05-15 DIAGNOSIS — J9600 Acute respiratory failure, unspecified whether with hypoxia or hypercapnia: Secondary | ICD-10-CM | POA: Diagnosis present

## 2015-05-15 DIAGNOSIS — I959 Hypotension, unspecified: Secondary | ICD-10-CM | POA: Diagnosis not present

## 2015-05-15 DIAGNOSIS — Z888 Allergy status to other drugs, medicaments and biological substances status: Secondary | ICD-10-CM | POA: Diagnosis not present

## 2015-05-15 DIAGNOSIS — E861 Hypovolemia: Secondary | ICD-10-CM | POA: Diagnosis not present

## 2015-05-15 DIAGNOSIS — R45851 Suicidal ideations: Secondary | ICD-10-CM | POA: Diagnosis not present

## 2015-05-15 DIAGNOSIS — Z4659 Encounter for fitting and adjustment of other gastrointestinal appliance and device: Secondary | ICD-10-CM

## 2015-05-15 DIAGNOSIS — F3130 Bipolar disorder, current episode depressed, mild or moderate severity, unspecified: Secondary | ICD-10-CM | POA: Diagnosis not present

## 2015-05-15 DIAGNOSIS — K219 Gastro-esophageal reflux disease without esophagitis: Secondary | ICD-10-CM | POA: Diagnosis not present

## 2015-05-15 DIAGNOSIS — R000 Tachycardia, unspecified: Secondary | ICD-10-CM | POA: Insufficient documentation

## 2015-05-15 DIAGNOSIS — M6282 Rhabdomyolysis: Secondary | ICD-10-CM | POA: Diagnosis present

## 2015-05-15 DIAGNOSIS — Z6835 Body mass index (BMI) 35.0-35.9, adult: Secondary | ICD-10-CM | POA: Diagnosis not present

## 2015-05-15 DIAGNOSIS — R45850 Homicidal ideations: Secondary | ICD-10-CM | POA: Diagnosis present

## 2015-05-15 DIAGNOSIS — J9601 Acute respiratory failure with hypoxia: Secondary | ICD-10-CM | POA: Diagnosis present

## 2015-05-15 DIAGNOSIS — E669 Obesity, unspecified: Secondary | ICD-10-CM | POA: Diagnosis present

## 2015-05-15 DIAGNOSIS — Z9114 Patient's other noncompliance with medication regimen: Secondary | ICD-10-CM

## 2015-05-15 DIAGNOSIS — R29898 Other symptoms and signs involving the musculoskeletal system: Secondary | ICD-10-CM | POA: Diagnosis not present

## 2015-05-15 DIAGNOSIS — R21 Rash and other nonspecific skin eruption: Secondary | ICD-10-CM | POA: Diagnosis not present

## 2015-05-15 DIAGNOSIS — R1310 Dysphagia, unspecified: Secondary | ICD-10-CM | POA: Diagnosis not present

## 2015-05-15 DIAGNOSIS — M62838 Other muscle spasm: Secondary | ICD-10-CM | POA: Diagnosis present

## 2015-05-15 DIAGNOSIS — B957 Other staphylococcus as the cause of diseases classified elsewhere: Secondary | ICD-10-CM | POA: Diagnosis not present

## 2015-05-15 DIAGNOSIS — R7881 Bacteremia: Secondary | ICD-10-CM | POA: Diagnosis present

## 2015-05-15 DIAGNOSIS — R531 Weakness: Secondary | ICD-10-CM | POA: Diagnosis not present

## 2015-05-15 DIAGNOSIS — D62 Acute posthemorrhagic anemia: Secondary | ICD-10-CM | POA: Insufficient documentation

## 2015-05-15 DIAGNOSIS — G9340 Encephalopathy, unspecified: Secondary | ICD-10-CM | POA: Diagnosis present

## 2015-05-15 DIAGNOSIS — R159 Full incontinence of feces: Secondary | ICD-10-CM | POA: Insufficient documentation

## 2015-05-15 DIAGNOSIS — R569 Unspecified convulsions: Secondary | ICD-10-CM

## 2015-05-15 DIAGNOSIS — R291 Meningismus: Secondary | ICD-10-CM | POA: Diagnosis not present

## 2015-05-15 DIAGNOSIS — J9602 Acute respiratory failure with hypercapnia: Secondary | ICD-10-CM | POA: Diagnosis not present

## 2015-05-15 DIAGNOSIS — Z79899 Other long term (current) drug therapy: Secondary | ICD-10-CM | POA: Diagnosis not present

## 2015-05-15 DIAGNOSIS — I9589 Other hypotension: Secondary | ICD-10-CM | POA: Diagnosis not present

## 2015-05-15 DIAGNOSIS — D696 Thrombocytopenia, unspecified: Secondary | ICD-10-CM | POA: Diagnosis not present

## 2015-05-15 DIAGNOSIS — G210 Malignant neuroleptic syndrome: Principal | ICD-10-CM | POA: Diagnosis present

## 2015-05-15 DIAGNOSIS — F6381 Intermittent explosive disorder: Secondary | ICD-10-CM | POA: Diagnosis present

## 2015-05-15 DIAGNOSIS — I10 Essential (primary) hypertension: Secondary | ICD-10-CM | POA: Insufficient documentation

## 2015-05-15 DIAGNOSIS — Z8669 Personal history of other diseases of the nervous system and sense organs: Secondary | ICD-10-CM | POA: Diagnosis present

## 2015-05-15 DIAGNOSIS — F419 Anxiety disorder, unspecified: Secondary | ICD-10-CM | POA: Diagnosis not present

## 2015-05-15 DIAGNOSIS — K5900 Constipation, unspecified: Secondary | ICD-10-CM | POA: Diagnosis not present

## 2015-05-15 DIAGNOSIS — Z9911 Dependence on respirator [ventilator] status: Secondary | ICD-10-CM | POA: Diagnosis not present

## 2015-05-15 DIAGNOSIS — R32 Unspecified urinary incontinence: Secondary | ICD-10-CM

## 2015-05-15 DIAGNOSIS — E870 Hyperosmolality and hypernatremia: Secondary | ICD-10-CM | POA: Diagnosis present

## 2015-05-15 DIAGNOSIS — R069 Unspecified abnormalities of breathing: Secondary | ICD-10-CM

## 2015-05-15 LAB — COMPREHENSIVE METABOLIC PANEL
ALBUMIN: 3.9 g/dL (ref 3.5–5.0)
ALK PHOS: 65 U/L (ref 38–126)
ALT: 92 U/L — ABNORMAL HIGH (ref 17–63)
ANION GAP: 13 (ref 5–15)
AST: 184 U/L — ABNORMAL HIGH (ref 15–41)
BUN: 34 mg/dL — ABNORMAL HIGH (ref 6–20)
CHLORIDE: 108 mmol/L (ref 101–111)
CO2: 27 mmol/L (ref 22–32)
Calcium: 9.8 mg/dL (ref 8.9–10.3)
Creatinine, Ser: 1.06 mg/dL (ref 0.61–1.24)
GFR calc Af Amer: >60 mL/min (ref >60)
GFR calc non Af Amer: >60 mL/min (ref >60)
GLUCOSE: 107 mg/dL — AB (ref 65–99)
POTASSIUM: 4.5 mmol/L (ref 3.5–5.1)
SODIUM: 148 mmol/L — AB (ref 135–145)
Total Bilirubin: 1.5 mg/dL — ABNORMAL HIGH (ref 0.3–1.2)
Total Protein: 7.4 g/dL (ref 6.5–8.1)

## 2015-05-15 LAB — CBC
HCT: 43.7 % (ref 39.0–52.0)
Hemoglobin: 14.2 g/dL (ref 13.0–17.0)
MCH: 28.5 pg (ref 26.0–34.0)
MCHC: 32.5 g/dL (ref 30.0–36.0)
MCV: 87.8 fL (ref 78.0–100.0)
Platelets: 86 10*3/uL — ABNORMAL LOW (ref 150–400)
RBC: 4.98 MIL/uL (ref 4.22–5.81)
RDW: 14.3 % (ref 11.5–15.5)
WBC: 13.6 10*3/uL — AB (ref 4.0–10.5)

## 2015-05-15 LAB — CBC WITH DIFFERENTIAL/PLATELET
BASOS PCT: 0 %
Basophils Absolute: 0 10*3/uL (ref 0–0.1)
EOS ABS: 0 10*3/uL (ref 0–0.7)
Eosinophils Relative: 0 %
HEMATOCRIT: 49.3 % (ref 40.0–52.0)
Hemoglobin: 16.6 g/dL (ref 13.0–18.0)
LYMPHS ABS: 1 10*3/uL (ref 1.0–3.6)
Lymphocytes Relative: 6 %
MCH: 28.2 pg (ref 26.0–34.0)
MCHC: 33.6 g/dL (ref 32.0–36.0)
MCV: 84 fL (ref 80.0–100.0)
MONO ABS: 1.5 10*3/uL — AB (ref 0.2–1.0)
MONOS PCT: 9 %
Neutro Abs: 14.1 10*3/uL — ABNORMAL HIGH (ref 1.4–6.5)
Neutrophils Relative %: 85 %
Platelets: 123 10*3/uL — ABNORMAL LOW (ref 150–440)
RBC: 5.88 MIL/uL (ref 4.40–5.90)
RDW: 13.9 % (ref 11.5–14.5)
WBC: 16.7 10*3/uL — ABNORMAL HIGH (ref 3.8–10.6)

## 2015-05-15 LAB — URINE DRUG SCREEN, QUALITATIVE (ARMC ONLY)
Amphetamines, Ur Screen: NOT DETECTED
BARBITURATES, UR SCREEN: NOT DETECTED
Benzodiazepine, Ur Scrn: POSITIVE — AB
COCAINE METABOLITE, UR ~~LOC~~: NOT DETECTED
Cannabinoid 50 Ng, Ur ~~LOC~~: NOT DETECTED
MDMA (ECSTASY) UR SCREEN: NOT DETECTED
METHADONE SCREEN, URINE: NOT DETECTED
Opiate, Ur Screen: NOT DETECTED
Phencyclidine (PCP) Ur S: NOT DETECTED
TRICYCLIC, UR SCREEN: NOT DETECTED

## 2015-05-15 LAB — URINALYSIS COMPLETE WITH MICROSCOPIC (ARMC ONLY)
BACTERIA UA: NONE SEEN
BILIRUBIN URINE: NEGATIVE
GLUCOSE, UA: NEGATIVE mg/dL
Leukocytes, UA: NEGATIVE
NITRITE: NEGATIVE
RBC / HPF: NONE SEEN RBC/hpf (ref 0–5)
Specific Gravity, Urine: 1.015 (ref 1.005–1.030)
Squamous Epithelial / LPF: NONE SEEN
WBC, UA: NONE SEEN WBC/hpf (ref 0–5)
pH: 6 (ref 5.0–8.0)

## 2015-05-15 LAB — BLOOD GAS, ARTERIAL
ACID-BASE EXCESS: 6.6 mmol/L — AB (ref 0.0–3.0)
Allens test (pass/fail): POSITIVE — AB
Bicarbonate: 30.5 mEq/L — ABNORMAL HIGH (ref 21.0–28.0)
FIO2: 0.48
O2 SAT: 98.3 %
PATIENT TEMPERATURE: 37
PH ART: 7.49 — AB (ref 7.350–7.450)
pCO2 arterial: 40 mmHg (ref 32.0–48.0)
pO2, Arterial: 101 mmHg (ref 83.0–108.0)

## 2015-05-15 LAB — SALICYLATE LEVEL: Salicylate Lvl: <4 mg/dL (ref 2.8–30.0)

## 2015-05-15 LAB — TROPONIN I: TROPONIN I: 0.05 ng/mL — AB (ref <0.031)

## 2015-05-15 LAB — PROTIME-INR
INR: 1.34 (ref 0.00–1.49)
Prothrombin Time: 16.7 seconds — ABNORMAL HIGH (ref 11.6–15.2)

## 2015-05-15 LAB — MRSA PCR SCREENING: MRSA BY PCR: NEGATIVE

## 2015-05-15 LAB — ACETAMINOPHEN LEVEL

## 2015-05-15 LAB — APTT: aPTT: 34 seconds (ref 24–37)

## 2015-05-15 LAB — CK: Total CK: 7546 U/L — ABNORMAL HIGH (ref 49–397)

## 2015-05-15 LAB — GLUCOSE, CAPILLARY: GLUCOSE-CAPILLARY: 87 mg/dL (ref 65–99)

## 2015-05-15 LAB — LACTIC ACID, PLASMA
LACTIC ACID, VENOUS: 2 mmol/L (ref 0.5–2.0)
Lactic Acid, Venous: 1.9 mmol/L (ref 0.5–2.0)

## 2015-05-15 LAB — PROCALCITONIN: Procalcitonin: <0.1 ng/mL

## 2015-05-15 MED ORDER — ENOXAPARIN SODIUM 30 MG/0.3ML ~~LOC~~ SOLN
30.0000 mg | SUBCUTANEOUS | Status: DC
Start: 2015-05-15 — End: 2015-05-16
  Administered 2015-05-15: 30 mg via SUBCUTANEOUS
  Filled 2015-05-15: qty 0.3

## 2015-05-15 MED ORDER — CLONAZEPAM 0.5 MG PO TABS
0.5000 mg | ORAL_TABLET | Freq: Every day | ORAL | Status: DC
  Administered 2015-05-15 – 2015-05-17 (×3): 0.5 mg via ORAL
  Filled 2015-05-15 (×3): qty 1

## 2015-05-15 MED ORDER — ACETAMINOPHEN 325 MG PO TABS
650.0000 mg | ORAL_TABLET | ORAL | Status: DC | PRN
  Administered 2015-05-16 – 2015-05-24 (×3): 650 mg via ORAL
  Filled 2015-05-15 (×3): qty 2

## 2015-05-15 MED ORDER — IOHEXOL 300 MG/ML  SOLN
125.0000 mL | Freq: Once | INTRAMUSCULAR | Status: AC | PRN
  Administered 2015-05-15: 125 mL via INTRAVENOUS

## 2015-05-15 MED ORDER — SODIUM CHLORIDE 0.9 % IV BOLUS (SEPSIS)
1000.0000 mL | Freq: Once | INTRAVENOUS | Status: AC
  Administered 2015-05-15: 1000 mL via INTRAVENOUS

## 2015-05-15 MED ORDER — LORAZEPAM 2 MG/ML IJ SOLN
2.0000 mg | Freq: Once | INTRAMUSCULAR | Status: AC
  Administered 2015-05-15: 2 mg via INTRAVENOUS
  Filled 2015-05-15: qty 1

## 2015-05-15 MED ORDER — PROPOFOL 10 MG/ML IV BOLUS
0.5000 mg/kg | Freq: Once | INTRAVENOUS | Status: AC
  Administered 2015-05-15: 59.7 mg via INTRAVENOUS

## 2015-05-15 MED ORDER — VANCOMYCIN HCL IN DEXTROSE 1-5 GM/200ML-% IV SOLN
1000.0000 mg | Freq: Once | INTRAVENOUS | Status: AC
  Administered 2015-05-15: 1000 mg via INTRAVENOUS
  Filled 2015-05-15: qty 200

## 2015-05-15 MED ORDER — VANCOMYCIN HCL 10 G IV SOLR
2500.0000 mg | Freq: Once | INTRAVENOUS | Status: DC
  Filled 2015-05-15: qty 2500

## 2015-05-15 MED ORDER — SODIUM CHLORIDE 0.45 % IV SOLN: INTRAVENOUS | Status: DC

## 2015-05-15 MED ORDER — VALPROATE SODIUM 500 MG/5ML IV SOLN
1000.0000 mg | INTRAVENOUS | Status: AC
  Administered 2015-05-15: 1000 mg via INTRAVENOUS
  Filled 2015-05-15: qty 10

## 2015-05-15 MED ORDER — PROPOFOL 1000 MG/100ML IV EMUL
5.0000 ug/kg/min | Freq: Once | INTRAVENOUS | Status: AC
  Administered 2015-05-15: 30 ug/kg/min via INTRAVENOUS

## 2015-05-15 MED ORDER — CHLORHEXIDINE GLUCONATE 0.12% ORAL RINSE (MEDLINE KIT)
15.0000 mL | Freq: Two times a day (BID) | OROMUCOSAL | Status: DC
  Administered 2015-05-15 – 2015-05-21 (×13): 15 mL via OROMUCOSAL

## 2015-05-15 MED ORDER — FENTANYL CITRATE (PF) 100 MCG/2ML IJ SOLN
100.0000 ug | INTRAMUSCULAR | Status: DC | PRN
  Administered 2015-05-17 – 2015-05-22 (×5): 100 ug via INTRAVENOUS
  Filled 2015-05-15 (×4): qty 2

## 2015-05-15 MED ORDER — DULOXETINE HCL 60 MG PO CPEP
60.0000 mg | ORAL_CAPSULE | Freq: Every day | ORAL | Status: DC
  Administered 2015-05-15 – 2015-05-22 (×4): 60 mg via ORAL
  Filled 2015-05-15 (×5): qty 1

## 2015-05-15 MED ORDER — DEXTROSE 5 % IV SOLN
2.0000 g | Freq: Two times a day (BID) | INTRAVENOUS | Status: DC
  Administered 2015-05-16 – 2015-05-18 (×5): 2 g via INTRAVENOUS
  Filled 2015-05-15 (×6): qty 2

## 2015-05-15 MED ORDER — PROPOFOL 1000 MG/100ML IV EMUL
5.0000 ug/kg/min | INTRAVENOUS | Status: DC
  Administered 2015-05-15 – 2015-05-16 (×5): 40 ug/kg/min via INTRAVENOUS
  Filled 2015-05-15 (×4): qty 100

## 2015-05-15 MED ORDER — ANTISEPTIC ORAL RINSE SOLUTION (CORINZ)
7.0000 mL | Freq: Four times a day (QID) | OROMUCOSAL | Status: DC
  Administered 2015-05-16 – 2015-05-22 (×26): 7 mL via OROMUCOSAL

## 2015-05-15 MED ORDER — MIDAZOLAM HCL 2 MG/2ML IJ SOLN
INTRAMUSCULAR | Status: AC
  Filled 2015-05-15: qty 2

## 2015-05-15 MED ORDER — SUCCINYLCHOLINE CHLORIDE 20 MG/ML IJ SOLN
200.0000 mg | Freq: Once | INTRAMUSCULAR | Status: AC
  Administered 2015-05-15: 200 mg via INTRAVENOUS

## 2015-05-15 MED ORDER — PROPOFOL 1000 MG/100ML IV EMUL
INTRAVENOUS | Status: AC
  Administered 2015-05-15: 30 ug/kg/min via INTRAVENOUS
  Filled 2015-05-15: qty 100

## 2015-05-15 MED ORDER — LORAZEPAM 2 MG/ML IJ SOLN
1.0000 mg | INTRAMUSCULAR | Status: DC | PRN
  Administered 2015-05-16 – 2015-05-22 (×9): 2 mg via INTRAVENOUS
  Filled 2015-05-15 (×11): qty 1

## 2015-05-15 MED ORDER — PANTOPRAZOLE SODIUM 40 MG IV SOLR: 40.0000 mg | INTRAVENOUS | Status: DC

## 2015-05-15 MED ORDER — DEXTROSE 5 % IV SOLN
2.0000 g | Freq: Once | INTRAVENOUS | Status: DC
  Filled 2015-05-15: qty 2

## 2015-05-15 MED ORDER — SODIUM CHLORIDE 0.9 % IV SOLN
1000.0000 mg | Freq: Two times a day (BID) | INTRAVENOUS | Status: DC
  Administered 2015-05-15 – 2015-05-20 (×10): 1000 mg via INTRAVENOUS
  Filled 2015-05-15 (×11): qty 10

## 2015-05-15 MED ORDER — ACYCLOVIR SODIUM 50 MG/ML IV SOLN
1000.0000 mg | Freq: Once | INTRAVENOUS | Status: AC
  Administered 2015-05-15: 1000 mg via INTRAVENOUS
  Filled 2015-05-15: qty 20

## 2015-05-15 MED ORDER — FENTANYL CITRATE (PF) 100 MCG/2ML IJ SOLN
100.0000 ug | INTRAMUSCULAR | Status: DC | PRN
  Filled 2015-05-15 (×2): qty 2

## 2015-05-15 MED ORDER — VANCOMYCIN HCL 10 G IV SOLR
1500.0000 mg | Freq: Once | INTRAVENOUS | Status: DC
  Filled 2015-05-15: qty 1500

## 2015-05-15 MED ORDER — CEFTRIAXONE SODIUM 2 G IJ SOLR
2.0000 g | Freq: Once | INTRAMUSCULAR | Status: AC
  Administered 2015-05-15: 2 g via INTRAVENOUS
  Filled 2015-05-15: qty 2

## 2015-05-15 MED ORDER — PANTOPRAZOLE SODIUM 40 MG PO PACK
40.0000 mg | PACK | Freq: Every day | ORAL | Status: DC
  Administered 2015-05-15 – 2015-05-27 (×13): 40 mg
  Filled 2015-05-15 (×14): qty 20

## 2015-05-15 MED ORDER — DEXTROSE-NACL 5-0.45 % IV SOLN
INTRAVENOUS | Status: DC
  Administered 2015-05-15: 18:00:00 via INTRAVENOUS
  Administered 2015-05-16: 100 mL/h via INTRAVENOUS
  Administered 2015-05-23: 23:00:00 via INTRAVENOUS

## 2015-05-15 MED ORDER — DIVALPROEX SODIUM ER 500 MG PO TB24
1000.0000 mg | ORAL_TABLET | Freq: Every day | ORAL | Status: DC
  Administered 2015-05-15: 1000 mg via ORAL
  Filled 2015-05-15 (×3): qty 2

## 2015-05-15 MED ORDER — SODIUM CHLORIDE 0.9 % IV SOLN: 250.0000 mL | INTRAVENOUS | Status: DC | PRN

## 2015-05-15 MED ORDER — SODIUM CHLORIDE 0.9 % IV SOLN
1000.0000 mg | INTRAVENOUS | Status: DC
  Filled 2015-05-15: qty 10

## 2015-05-15 MED ORDER — ASENAPINE MALEATE 5 MG SL SUBL
5.0000 mg | SUBLINGUAL_TABLET | Freq: Every day | SUBLINGUAL | Status: DC
  Administered 2015-05-15 – 2015-05-21 (×7): 5 mg via SUBLINGUAL
  Filled 2015-05-15 (×8): qty 1

## 2015-05-15 MED ORDER — PROPOFOL 1000 MG/100ML IV EMUL: 5.0000 ug/kg/min | Freq: Once | INTRAVENOUS | Status: DC

## 2015-05-15 MED ORDER — VANCOMYCIN HCL IN DEXTROSE 1-5 GM/200ML-% IV SOLN
1000.0000 mg | Freq: Three times a day (TID) | INTRAVENOUS | Status: DC
  Administered 2015-05-16 – 2015-05-17 (×5): 1000 mg via INTRAVENOUS
  Filled 2015-05-15 (×6): qty 200
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
## 2015-05-15 NOTE — ED Notes (Addendum)
Care link at bedside. 4th bag of fluid hung.

## 2015-05-15 NOTE — Progress Notes (Signed)
ANTIBIOTIC CONSULT NOTE - INITIAL  Pharmacy Consult for Ceftriaxone and Vancomycin Indication: presumed meningitis I  Allergies  Allergen Reactions  . Haldol [Haloperidol Lactate] Other (See Comments)    Patient Measurements: Height:  (180.3 cm) (from [facility] on 05/15/15) Weight: 263 lb (119.296 kg) (from [facility] on 05/15/15) IBW/kg (Calculated) : 75.3 Adjusted Body Weight:   Vital Signs: Temp: 99.2 F (37.3 C) (02/19 1420) Temp Source: Other (Comment) (02/19 1143) BP: 106/72 mmHg (02/19 1430) Pulse Rate: 111 (02/19 1430) Intake/Output from previous day:   Intake/Output from this shift:    Labs:  Recent Labs  05/15/15 1113  WBC 16.7*  HGB 16.6  PLT 123*  CREATININE 1.06   Estimated Creatinine Clearance: 143.6 mL/min (by C-G formula based on Cr of 1.06). No results for input(s): VANCOTROUGH, VANCOPEAK, VANCORANDOM, GENTTROUGH, GENTPEAK, GENTRANDOM, TOBRATROUGH, TOBRAPEAK, TOBRARND, AMIKACINPEAK, AMIKACINTROU, AMIKACIN in the last 72 hours.   Microbiology: No results found for this or any previous visit (from the past 720 hour(s)).  Medical History: Past Medical History  Diagnosis Date  . GERD (gastroesophageal reflux disease)   . Anxiety   . Depression     Medications:  Scheduled:  . cefTRIAXone (ROCEPHIN)  IV  2 g Intravenous Once  . [START ON 05/16/2015] cefTRIAXone (ROCEPHIN)  IV  2 g Intravenous Q12H  . enoxaparin (LOVENOX) injection  30 mg Subcutaneous Q24H  . pantoprazole sodium  40 mg Per Tube Daily   Assessment: 25 y.o male transferred from [facility]. Intubated and ventilated. Chief complaint of seizures. Witnessed seizure. Pt found unresponsive in jail. He received Acyclovir 1gm IV x1 at [facility] today @ 13:30.  Pharmacy consulted to does  Ceftriaxone and Vancomycin for presumed meningitis.  SCr =   1.06 , estimated Crcll ~ >100 ml/min  WBC 16.7. Tc 99.2  GOAL:  Vancomycin trough level 15-20 mcg/ml  Plan:  Ceftriaxone 2 gm IV q12h Vancomycin 2500 mg  IV x1 loading dose then 1000 mg IV q8h Monitor clinical status, renal function, culture results Vanc Trough at steady state.   Noah Delaine, RPh Clinical Pharmacist Pager: [phone] 05/15/2015,5:03 PM

## 2015-05-15 NOTE — ED Notes (Signed)
The patient was brought in via EMS for seizure. Paramedic Okey Dupre and [place] stated that the patient was not seizing at this present time. I observed the patient and agreed about his present condition. My desicion was to decon the patient prior to taking him to room 26. He was taken to the decon room and cleaned thoroughly with soap and water. The patient arrived with urine and had feces all over him.

## 2015-05-15 NOTE — ED Notes (Signed)
Upon reassessment patient is non responsive. MD at bedside. MD opened patient eyes. Eyes are fixed. Pupils are reactive but sluggish. Patient is more rigid. Neck is stiff. When pillow is taking from behind patients head, he holds his head up (not resting it back on the bed). Patients eyes begin fluttering. Patients right arm begins to shake. MD states patient is having another seizure and that he wants to intubate. New orders received at this time.

## 2015-05-15 NOTE — ED Notes (Signed)
Chase Hampton, [place], at bedside with patient.

## 2015-05-15 NOTE — Progress Notes (Signed)
eLink Physician-Brief Progress Note Patient Name: AMERE BRICCO DOB: 03-17-1991 MRN: [id number]   Date of Service  05/15/2015  HPI/Events of Note  New patient in transfer from [facility]. Intubated and ventilated.   eICU Interventions  Will order: 1. Ventilator Orders: 50%/PRVC 16/TV 600/P 5. 2. ABG at 4:30 PM. 3. Propofol IV infusion. Titrate to RASS = -2 to -3. 4. Protonix IV.      Intervention Category Major Interventions: Respiratory failure - evaluation and management Intermediate Interventions: Best-practice therapies (e.g. DVT, beta blocker, etc.) Minor Interventions: Agitation / anxiety - evaluation and management  Sommer,Steven Eugene 05/15/2015, 4:20 PM

## 2015-05-15 NOTE — ED Notes (Signed)
Per EMS, Patient comes from [employer] [facility]. Patient has been on suicide watch. They have been trying to ween him off Ativan. When EMS arrived patient was unresponsive. Pupils were non reactive. Patient was seizing, EMS is not sure how long he had been seizing prior to arrival. Patient was soaked in urine, patient decon'ed. RA SpO2 82%. EMS placed patient on non rebreather, SpO2 up to 96%. NPA to left nostril. EMS gave 4 of Versed. Patient is able to answer MD's questions at this time.

## 2015-05-15 NOTE — ED Notes (Signed)
Patient transported to CT 

## 2015-05-15 NOTE — ED Provider Notes (Signed)
[facility] Emergency Department Provider Note  ____________________________________________  Time seen: Approximately 11:15 AM  I have reviewed the triage vital signs and the nursing notes.   HISTORY  Chief Complaint Seizures  EM caveat: The patient active seizure on arrival, very somnolent and only able to answer basic questions  HPI Chase Hampton is a 25 y.o. male   Witnessed seizure. Found unresponsive incontinent jail. EMS reports he has been tapering down on benzodiazepine. The patient was given 2 boluses of IV Versed with some improvement with EMS. He remains quite somnolent.  Past Medical History  Diagnosis Date  . GERD (gastroesophageal reflux disease)   . Anxiety   . Depression     Patient Active Problem List   Diagnosis Date Noted  . Major depressive disorder, recurrent episode, moderate (HCC) 04/30/2015    Past Surgical History  Procedure Laterality Date  . No past surgeries      No current outpatient prescriptions on file.  Allergies Haldol  Family History  Problem Relation Age of Onset  . Heart disease Maternal Grandfather   . Diabetes Maternal Grandfather   . Heart disease Paternal Grandmother     Social History Social History  Substance Use Topics  . Smoking status: Former Smoker    Types: Cigars  . Smokeless tobacco: Never Used  . Alcohol Use: No    Review of Systems EM caveat: Unable to provide  ____________________________________________   PHYSICAL EXAM:  VITAL SIGNS: ED Triage Vitals  Enc Vitals Group     BP 05/15/15 1112 124/102 mmHg     Pulse Rate 05/15/15 1112 130     Resp 05/15/15 1112 26     Temp 05/15/15 1112 99.2 F (37.3 C)     Temp Source 05/15/15 1112 Axillary     SpO2 05/15/15 1105 96 %     Weight 05/15/15 1112 263 lb (119.296 kg)     Height 05/15/15 1112 5\' 11"  (1.803 m)     Head Cir --      Peak Flow --      Pain Score --      Pain Loc --      Pain Edu? --      Excl. in GC?  --    Constitutional: Patient lethargic. Doesn't answer basic questions such as his name, able to tell me his name is "Chase Hampton" until me his last name. He does deny any pain, but is unable to tell me where he is. He will follow only very basic commands such as lift both hands which he does with weakness that is symmetric bilateral. Eyes: Mild serous fluid conjunctiva bilateral Head: Atraumatic. Nose: No congestion/rhinnorhea. Mouth/Throat: Mucous membranes are moist.  Oropharynx non-erythematous. Neck: No stridor.   Cardiovascular: Tachycardic rate, regular rhythm. Grossly normal heart sounds.  Good peripheral circulation. Respiratory: Patient taking adequate respirations. Protecting airway. Lungs are clear bilateral. Gastrointestinal: Soft and nontender. No distention.  Musculoskeletal: 2+ bilateral lower extremity edema.  No joint effusions. Neurologic:  Lethargic, equal grip strength bilateral. Patient will follow only very basic commands. He is oriented to self. Skin:  Skin is quite warm, dry and intact. No rash noted.   ____________________________________________   LABS (all labs ordered are listed, but only abnormal results are displayed)  Labs Reviewed  CK - Abnormal; Notable for the following:    Total CK 7546 (*)    All other components within normal limits  TROPONIN I - Abnormal; Notable for the following:  Troponin I 0.05 (*)    All other components within normal limits  CBC WITH DIFFERENTIAL/PLATELET - Abnormal; Notable for the following:    WBC 16.7 (*)    Platelets 123 (*)    Neutro Abs 14.1 (*)    Monocytes Absolute 1.5 (*)    All other components within normal limits  URINALYSIS COMPLETEWITH MICROSCOPIC ([facility] ONLY) - Abnormal; Notable for the following:    Color, Urine AMBER (*)    APPearance CLEAR (*)    Ketones, ur 2+ (*)    Hgb urine dipstick 2+ (*)    Protein, ur 1+ (*)    All other components within normal limits  ACETAMINOPHEN LEVEL - Abnormal;  Notable for the following:    Acetaminophen (Tylenol), Serum <10 (*)    All other components within normal limits  URINE DRUG SCREEN, QUALITATIVE ([facility] ONLY) - Abnormal; Notable for the following:    Benzodiazepine, Ur Scrn POSITIVE (*)    All other components within normal limits  BLOOD GAS, ARTERIAL - Abnormal; Notable for the following:    pH, Arterial 7.49 (*)    Bicarbonate 30.5 (*)    Acid-Base Excess 6.6 (*)    Allens test (pass/fail) POSITIVE (*)    All other components within normal limits  COMPREHENSIVE METABOLIC PANEL - Abnormal; Notable for the following:    Sodium 148 (*)    Glucose, Bld 107 (*)    BUN 34 (*)    AST 184 (*)    ALT 92 (*)    Total Bilirubin 1.5 (*)    All other components within normal limits  CULTURE, BLOOD (ROUTINE X 2)  CULTURE, BLOOD (ROUTINE X 2)  LACTIC ACID, PLASMA  LACTIC ACID, PLASMA  SALICYLATE LEVEL  GLUCOSE, CAPILLARY  CBG MONITORING, ED   ____________________________________________  EKG  Reviewed and interpreted 11:15 AM Ventricular rate 120 By mouth 140 QRS 90 QTc 440 Reviewed and interpreted as sinus tachycardia without ST elevation. ____________________________________________  RADIOLOGY  CT Head Wo Contrast (Final result) Result time: 05/15/15 14:28:38   Final result by Rad Results In Interface (05/15/15 14:28:38)   Narrative:   CLINICAL DATA: Patient comes from [place] jail. Patient has been on suicide watch. They have been trying to wean him off Ativan. When EMS arrived patient was unresponsive. Pupils were non reactive. Seizures.  EXAM: CT HEAD WITHOUT CONTRAST  TECHNIQUE: Contiguous axial images were obtained from the base of the skull through the vertex without intravenous contrast.  COMPARISON: 04/30/2015 MRI  FINDINGS: There is no intra or extra-axial fluid collection or mass lesion. The basilar cisterns and ventricles have a normal appearance. There is no CT evidence for acute infarction or  hemorrhage. Bone windows show mucoperiosteal thickening of the right maxillary sinus. No acute calvarial abnormality.  IMPRESSION: No evidence for acute intracranial abnormality. Mild changes of chronic sinusitis.   Electronically Signed By: Norva Pavlov M.D. On: 05/15/2015 14:28          CT Abdomen Pelvis W Contrast (Final result) Result time: 05/15/15 14:34:35   Final result by Rad Results In Interface (05/15/15 14:34:35)   Narrative:   CLINICAL DATA: Patient comes from [employer] jail. Patient has been on suicide watch. They have been trying to wean him off Ativan. When EMS arrived patient was unresponsive. Pupils were non reactive. Seizures.  EXAM: CT ABDOMEN AND PELVIS WITH CONTRAST  TECHNIQUE: Multidetector CT imaging of the abdomen and pelvis was performed using the standard protocol following bolus administration of intravenous contrast.  CONTRAST: OMNIPAQUE IOHEXOL 300 MG/ML SOLN  COMPARISON: None.  FINDINGS: Lower chest: There patchy densities at the lung bases bilaterally, consistent atelectasis or early infiltrates. Heart size is normal. No imaged pericardial effusion or significant coronary artery calcifications.  Upper abdomen: Artifact from the patient's arms at his sides. No focal abnormality identified within the liver, spleen, pancreas, or adrenal glands. Gallbladder is present. Kidneys show symmetric bilateral enhancement. No hydronephrosis or renal mass.  Gastrointestinal tract: Nasogastric tube in place to the stomach. Stomach has a normal appearance. Small bowel loops are normal in appearance. The appendix is well seen and has a normal appearance. Colonic loops are normal in appearance.  Pelvis: Foley catheter decompresses the bladder. Air is identified within the urinary bladder. No free pelvic fluid.  Retroperitoneum: No evidence for aortic aneurysm. No retroperitoneal or mesenteric adenopathy.  Abdominal wall:  Unremarkable.  Osseous structures: Unremarkable.  IMPRESSION: No evidence for acute abnormality.  Patchy densities at the lung bases bilaterally, left greater than right raising the question of early infiltrate versus atelectasis.  Foley catheter. Nasogastric tube.   Electronically Signed By: Norva Pavlov M.D. On: 05/15/2015 14:34          DG Chest Portable 1 View (Final result) Result time: 05/15/15 13:37:39   Final result by Rad Results In Interface (05/15/15 13:37:39)   Narrative:   CLINICAL DATA: Unresponsive  EXAM: PORTABLE CHEST 1 VIEW  COMPARISON: 05/15/2015 1200 hours  FINDINGS: Endotracheal tube placed. Tip is 2.5 cm from the carina. Low volumes. Bibasilar atelectasis. No pneumothorax. Normal heart size.  IMPRESSION: Endotracheal tube placed. Bibasilar atelectasis persists   Electronically Signed By: Jolaine Click M.D. On: 05/15/2015 13:37          DG Chest Portable 1 View (Final result) Result time: 05/15/15 12:45:56   Final result by Rad Results In Interface (05/15/15 12:45:56)   Narrative:   CLINICAL DATA: Per EMS, Patient comes [place] where they have been trying to ween him off Ativan. When EMS arrived patient was unresponsive. Pupils were non reactive. Patient was seizing, EMS is not sure how long he had been seizing prior to arrival.  EXAM: PORTABLE CHEST 1 VIEW  COMPARISON: 07/23/2014  FINDINGS: Heart, mediastinum hila are unremarkable. Clear lungs. No pleural effusion or pneumothorax.  Skeletal structures are unremarkable.  IMPRESSION: No active disease.   Electronically Signed By: Amie Portland M.D. On: 05/15/2015 12:45     DG Chest Portable 1 View (Final result) Result time: 05/15/15 13:37:39   Final result by Rad Results In Interface (05/15/15 13:37:39)   Narrative:   CLINICAL DATA: Unresponsive  EXAM: PORTABLE CHEST 1 VIEW  COMPARISON: 05/15/2015 1200  hours  FINDINGS: Endotracheal tube placed. Tip is 2.5 cm from the carina. Low volumes. Bibasilar atelectasis. No pneumothorax. Normal heart size.  IMPRESSION: Endotracheal tube placed. Bibasilar atelectasis persists   Electronically Signed By: Jolaine Click M.D. On: 05/15/2015 13:37          DG Chest Portable 1 View (Final result) Result time: 05/15/15 12:45:56   Final result by Rad Results In Interface (05/15/15 12:45:56)   Narrative:   CLINICAL DATA: Per EMS, Patient comes from [place] jail where they have been trying to ween him off Ativan. When EMS arrived patient was unresponsive. Pupils were non reactive. Patient was seizing, EMS is not sure how long he had been seizing prior to arrival.  EXAM: PORTABLE CHEST 1 VIEW  COMPARISON: 07/23/2014  FINDINGS: Heart, mediastinum hila are unremarkable. Clear lungs. No pleural  effusion or pneumothorax.  Skeletal structures are unremarkable.  IMPRESSION: No active disease.   Electronically Signed By: Amie Portland M.D. On: 05/15/2015 12:45       ____________________________________________   PROCEDURES  Procedure(s) performed: Intubation  Critical Care performed: Yes, see critical care note(s)  INTUBATION Performed by: Sharyn Creamer  Required items: required blood products, implants, devices, and special equipment available Patient identity confirmed: provided demographic data and hospital-assigned identification number Time out: Immediately prior to procedure a "time out" was called to verify the correct patient, procedure, equipment, support staff and site/side marked as required.  Indications: Unresponsive, concern for recurrent seizure   Intubation method: Glidescope Laryngoscopy   Preoxygenation: BVM  Sedatives: Propofol  Paralytic: Succinylcholine  Tube Size: 7.5 cuffed  Post-procedure assessment: chest rise and ETCO2 monitor Breath sounds: equal and absent over the  epigastrium Tube secured with: ETT holder Chest x-ray interpreted by radiologist and me.  Chest x-ray findings: endotracheal tube in appropriate position  Patient tolerated the procedure well with no immediate complications. no episodes of hypoxia or desaturation. First pass.  ----------------------------------------- 1:35 PM on 05/15/2015 -----------------------------------------  Approximately 20 minutes ago the patient was noted to be unresponsive again, on reexam he is no longer answering questions appropriately and is noted to have tachycardia with a high frequency bilateral upper extremity low amplitude tremor. Also muscular rigidity. Patient appears to be potentially an active recurrent seizure. Given report of 2 previous seizures, active seizure and have given Ativan with no change after only a few minutes decision made to place patient on propofol infusion in the event of ongoing status epilepticus. The patient was intubated, no complication. Currently oxygenating well. Patient now flaccid, no evidence of ongoing seizure activity. Discussed case with medical ICU at [facility] patient has complex care, Dr. Thad Ranger of neurology who advises additional antiepileptic which is ordered. Concern for possible benzodiazepine withdrawal, intracranial hemorrhage, intracranial catastrophe, possible neuroleptic malignant syndrome, sepsis, meningitis all considered. Given complexity of care, ventilatory status and review of his labs I am covering him empirically for possible meningitis. Anticipate difficulty with lumbar puncture due to patient's size, body habitus, and ventilatory requirements, and thus at this point we'll defer lumbar puncture to critical care setting. Case and care discussed and the patient accepted in transfer to Dr. Vassie Loll at [facility] medical ICU.  Await CT head, have also obtained CT abdomen and pelvis to exclude intra-abdominal pathology as cause of leukocytosis mild transaminitis.  Salicylate acetaminophen levels are normal.  ____________________________________________   INITIAL IMPRESSION / ASSESSMENT AND PLAN / ED COURSE  Pertinent labs & imaging results that were available during my care of the patient were reviewed by me and considered in my medical decision making (see chart for details).  Patient presents after 2 witnessed seizures with EMS. He is now somnolent, responsive able to tell me his name and follow basic commands such as moving his arms lifting his hands. He does appear fatigued, somnolent but his respiratory adequately and protecting airway time of arrival. We will treat him with IV valproic acid as advised by Dr. Thad Ranger of neurology. Obtain CT imaging, labs and further workup.  CRITICAL CARE Performed by: Sharyn Creamer   Total critical care time: 75 minutes  Critical care time was exclusive of separately billable procedures and treating other patients.  Critical care was necessary to treat or prevent imminent or life-threatening deterioration.  Critical care was time spent personally by me on the following activities: development of treatment plan with patient and/or surrogate  as well as nursing, discussions with consultants, evaluation of patient's response to treatment, examination of patient, obtaining history from patient or surrogate, ordering and performing treatments and interventions, ordering and review of laboratory studies, ordering and review of radiographic studies, pulse oximetry and re-evaluation of patient's condition.  Time includes reevaluation, complex medical decision making, reviewed and ordering labs and x-rays, arranging transfer and discussion with neuro consult. Dr. rales advises transfer for further workup at critical care level with available in-house neurology and EEG monitoring which isn't available at [facility]. Continuous EEG monitoring not presently available during the weekend at this  hospital. ____________________________________________   FINAL CLINICAL IMPRESSION(S) / ED DIAGNOSES  Final diagnoses:  Seizure (HCC)  Acute respiratory failure, unspecified whether with hypoxia or hypercapnia (HCC)  Convulsive status epilepticus (HCC)  Non-traumatic rhabdomyolysis      Sharyn Creamer, MD 05/15/15 1636

## 2015-05-15 NOTE — ED Notes (Signed)
Pt back from CT. Pt taken to CT with RN and RT and Environmental consultant.

## 2015-05-15 NOTE — H&P (Signed)
PULMONARY / CRITICAL CARE MEDICINE   Name: Chase Hampton MRN: [id number] DOB: June 01, 1990    ADMISSION DATE:  05/15/2015 CONSULTATION DATE:  05/15/15  REFERRING MD:  Dr. Normand Sloop   CHIEF COMPLAINT:  Seizure, Acute Respiratory Failure   HISTORY OF PRESENT ILLNESS:  25 y/o M with PMH of GERD, anxiety and depression (prior [facility] hospitalizations) with recent admission to [facility] in  from 2/3-2/8 for major depressive disorder with homicidal / suicidal ideations after a recent break up with his fiancee.  Due to restraining order violations, he was transferred from [facility] to [facility] jail were he has been on suicide watch since arrival.  The patient reportedly refused to eat, drink or take medications.  He was reportedly seen lying in the floor and would urinate on himself.  After multiple episodes of urinating on himself, he as asked to mop up the floor which he was able to perform.  Prior to discharge from [facility], notes reflect he had multiple episodes of physically aggressive behavior - including striking his own head on the seclusion room walls.  At discharge he was to continue on Cymbalta, Depakote and Saphris.   Prison staff report the patient refused all medications after arrival.    On 2/19, the patient was noted to have a seizure in jail.  He was transported to [facility] ER via EMS.  It is documented that he arrived to the ER with urine and feces all over him.  There is question if patient was weaning off ativan?? Per [facility] notes but discharge note from [facility] does not include Rx for ativan. Room air saturations were 82%, he was treated with 100% NRB.  He was treated with  IV versed per EMS in route.  The patient had a second seizure in ER and was ultimately intubated.  He was transferred to [facility] ICU for further evaluation.    The patient arrived on propofol, mechanically intubated.    PAST MEDICAL HISTORY :  He  has a past medical history of GERD (gastroesophageal reflux disease); Anxiety; and  Depression.  PAST SURGICAL HISTORY: He  has past surgical history that includes No past surgeries.  Allergies  Allergen Reactions  . Haldol [Haloperidol Lactate] Other (See Comments)    No current facility-administered medications on file prior to encounter.   Current Outpatient Prescriptions on File Prior to Encounter  Medication Sig  . asenapine (SAPHRIS) 5 MG SUBL 24 hr tablet Place 1 tablet (5 mg total) under the tongue at bedtime.  . clonazePAM (KLONOPIN) 0.5 MG tablet Take 1 tablet (0.5 mg total) by mouth at bedtime.  . clonazePAM (KLONOPIN) 0.5 MG tablet Take 1 tablet (0.5 mg total) by mouth 3 (three) times daily as needed (anxiety).  Marland Kitchen divalproex (DEPAKOTE ER) 500 MG 24 hr tablet Take 2 tablets (1,000 mg total) by mouth at bedtime.  . DULoxetine (CYMBALTA) 60 MG capsule Take 1 capsule (60 mg total) by mouth daily.  . meloxicam (MOBIC) 15 MG tablet Take 1 tablet (15 mg total) by mouth daily.    FAMILY HISTORY:  His has no family status information on file.   SOCIAL HISTORY: He  reports that he has quit smoking. His smoking use included Cigars. He has never used smokeless tobacco. He reports that he uses illicit drugs (Marijuana). He reports that he does not drink alcohol.  REVIEW OF SYSTEMS:   Unable to complete with patient as he remains on mechanical ventilation   SUBJECTIVE:    VITAL SIGNS: There were no vitals  taken for this visit.  HEMODYNAMICS:    VENTILATOR SETTINGS: Vent Mode:  [-] AC FiO2 (%):  [75 %] 75 % Set Rate:  [14 bmp] 14 bmp Vt Set:  [500 mL] 500 mL PEEP:  [5 cmH20] 5 cmH20  INTAKE / OUTPUT:    PHYSICAL EXAMINATION: General:  Well developed adult male in NAD on vent  Neuro:  Sedate on propofol, follows commands on HEENT:  ETT, mm pink/moist, no jvd Cardiovascular:  s1s2 rrr, no m/r/g Lungs:  Even/non-labored, lungs bilaterally clear  Abdomen:  Obese/soft, bsx4 active  Musculoskeletal:  No acute deformities  Skin:  Cool /dry, old  bruising to left thigh  LABS:  BMET  Recent Labs Lab 05/15/15 1113  NA 148*  K 4.5  CL 108  CO2 27  BUN 34*  CREATININE 1.06  GLUCOSE 107*    Electrolytes  Recent Labs Lab 05/15/15 1113  CALCIUM 9.8    CBC  Recent Labs Lab 05/15/15 1113  WBC 16.7*  HGB 16.6  HCT 49.3  PLT 123*    Coag's No results for input(s): APTT, INR in the last 168 hours.  Sepsis Markers  Recent Labs Lab 05/15/15 1113 05/15/15 1420  LATICACIDVEN 2.0 1.9    ABG  Recent Labs Lab 05/15/15 1114  PHART 7.49*  PCO2ART 40  PO2ART 101    Liver Enzymes  Recent Labs Lab 05/15/15 1113  AST 184*  ALT 92*  ALKPHOS 65  BILITOT 1.5*  ALBUMIN 3.9    Cardiac Enzymes  Recent Labs Lab 05/15/15 1113  TROPONINI 0.05*    Glucose  Recent Labs Lab 05/15/15 1248  GLUCAP 87    Imaging Ct Head Wo Contrast  05/15/2015  CLINICAL DATA:  Patient comes from [facility]. Patient has been on suicide watch. They have been trying to wean him off Ativan. When EMS arrived patient was unresponsive. Pupils were non reactive. Seizures. EXAM: CT HEAD WITHOUT CONTRAST TECHNIQUE: Contiguous axial images were obtained from the base of the skull through the vertex without intravenous contrast. COMPARISON:  04/30/2015 MRI FINDINGS: There is no intra or extra-axial fluid collection or mass lesion. The basilar cisterns and ventricles have a normal appearance. There is no CT evidence for acute infarction or hemorrhage. Bone windows show mucoperiosteal thickening of the right maxillary sinus. No acute calvarial abnormality. IMPRESSION: No evidence for acute intracranial abnormality. Mild changes of chronic sinusitis. Electronically Signed   By: Norva Pavlov M.D.   On: 05/15/2015 14:28   Ct Abdomen Pelvis W Contrast  05/15/2015  CLINICAL DATA:  Patient comes from [facility] jail. Patient has been on suicide watch. They have been trying to wean him off Ativan. When EMS arrived patient was  unresponsive. Pupils were non reactive. Seizures. EXAM: CT ABDOMEN AND PELVIS WITH CONTRAST TECHNIQUE: Multidetector CT imaging of the abdomen and pelvis was performed using the standard protocol following bolus administration of intravenous contrast. CONTRAST:  OMNIPAQUE IOHEXOL 300 MG/ML  SOLN COMPARISON:  None. FINDINGS: Lower chest: There patchy densities at the lung bases bilaterally, consistent atelectasis or early infiltrates. Heart size is normal. No imaged pericardial effusion or significant coronary artery calcifications. Upper abdomen: Artifact from the patient's arms at his sides. No focal abnormality identified within the liver, spleen, pancreas, or adrenal glands. Gallbladder is present. Kidneys show symmetric bilateral enhancement. No hydronephrosis or renal mass. Gastrointestinal tract: Nasogastric tube in place to the stomach. Stomach has a normal appearance. Small bowel loops are normal in appearance. The appendix is  well seen and has a normal appearance. Colonic loops are normal in appearance. Pelvis: Foley catheter decompresses the bladder. Air is identified within the urinary bladder. No free pelvic fluid. Retroperitoneum: No evidence for aortic aneurysm. No retroperitoneal or mesenteric adenopathy. Abdominal wall: Unremarkable. Osseous structures: Unremarkable. IMPRESSION: No evidence for acute  abnormality. Patchy densities at the lung bases bilaterally, left greater than right raising the question of early infiltrate versus atelectasis. Foley catheter.  Nasogastric tube. Electronically Signed   By: Norva Pavlov M.D.   On: 05/15/2015 14:34   Dg Chest Portable 1 View  05/15/2015  CLINICAL DATA:  Unresponsive EXAM: PORTABLE CHEST 1 VIEW COMPARISON:  05/15/2015 1200 hours FINDINGS: Endotracheal tube placed. Tip is 2.5 cm from the carina. Low volumes. Bibasilar atelectasis. No pneumothorax. Normal heart size. IMPRESSION: Endotracheal tube placed.  Bibasilar atelectasis persists  Electronically Signed   By: Jolaine Click M.D.   On: 05/15/2015 13:37   Dg Chest Portable 1 View  05/15/2015  CLINICAL DATA:  Per EMS, Patient comes from [facility] jail where they have been trying to ween him off Ativan. When EMS arrived patient was unresponsive. Pupils were non reactive. Patient was seizing, EMS is not sure how long he had been seizing prior to arrival. EXAM: PORTABLE CHEST 1 VIEW COMPARISON:  07/23/2014 FINDINGS: Heart, mediastinum hila are unremarkable. Clear lungs. No pleural effusion or pneumothorax. Skeletal structures are unremarkable. IMPRESSION: No active disease. Electronically Signed   By: Amie Portland M.D.   On: 05/15/2015 12:45     STUDIES:  LP 2/19 >>   CULTURES: LP 2/19 >>   ANTIBIOTICS: Rocephin 2gm 2/19 >>   SIGNIFICANT EVENTS: 2/19  Admit from jail with seizure, intubated   LINES/TUBES: ETT 2/19 >>   DISCUSSION: 25 y/o M with PMH of major depression with multiple PSY admissions admitted 2/19 with seizure (no hx). Intubated in ER and transferred to [facility] ICU.    ASSESSMENT / PLAN:  PULMONARY A: Acute Hypoxic Respiratory Failure - in setting of seizure, brief hypoxia, resolved  P:   PRVC 8cc/kg Wean PEEP / FiO2 for sats > 92% Follow up ABG  Assess CXR post arrival to ensure proper placement of ETT  CARDIOVASCULAR A:  Hypotension - mild, suspect sedation related  P:  ICU monitoring of hemodynamics  D51/2 NS @ 153ml/hr  RENAL A:   Rhabdomyolysis - admit CK >7500 At Risk AKI - in setting of rhabdo, hypotension P:   Ensure adequate hydration  Trend BMP / UOP Monitor CK  Replace electrolytes as indicated   GASTROINTESTINAL A:   Obesity  P:   NPO / OGT Begin TF in am if remains intubated  PPI while on MV  HEMATOLOGIC A:   Leukocytosis  Thrombocytopenia  P:  Trend CBC  Heparin for DVT prophylaxis   INFECTIOUS A:   R/O Meningitis - doubt given history, no fever or nuchal rigidity.  Follows commands on propofol  P:   LP  per Neurology  ABX / Cultures as above Follow WBC / fever curve  2gm dosing of rocephin  ENDOCRINE A:   Mild hyperglycemia  P:   Trend glucose on BMP   NEUROLOGIC A:   Seizure - suspect related to abrupt cessation of medications +/- benzo withdrawal.  R/O Meningitis  P:   RASS goal: -1 to 2 Propofol for sedation  PRN fentanyl for pain  Continue home saphris, klonopin, depakote, cymbalta  Ativan PRN seizure   FAMILY  - Updates:  Unable to update  family due to patients status as a prisoner.    - Inter-disciplinary family meet or Palliative Care meeting due by: 2/26     Canary Brim, NP-C [facility] Pgr: [phone] or if no answer [phone] 05/15/2015, 4:46 PM

## 2015-05-15 NOTE — Progress Notes (Signed)
Pt transferred to 3M11.  Dr. Lavon Paganini inquired about consent for an LP.  I called Earlean Shawl at [phone] to ask if they gave consent since patient was in custody.  He gave me moms name and number, Nickoli Bagheri [phone] and told me to call her for consent.  He also informed me that the patientt is not allowed to have visitors and medical update is not to be given to anyone.  Patient was then made a XXX.  Mother was called to prepare for consent for LP.  She preceded to say she was coming to visit and was informed that visitation was not allowed.  The only information given was that he was in the hospital and needed consents for procedures.  She asked about visitation and I gave her Lt Anderson's number to contact him.  Shortly after, Dr. [name]. [phone] called and said he was patient' physician and wanted an update and to talk to the physician.  Information was again not disclosed and told him I would write his number down in case a provider needed to get in contact with him.  [place] sherrif department at bedside and updated.

## 2015-05-15 NOTE — Progress Notes (Signed)
Pharmacy note: Vancomycin   -RN called to report that patient received a dose of  IV vancomycin at [place]  Plan -Change vancomycin to  IV now to complete a  IV load -Will follow renal function, cultures and clinical progress  Harland German, Pharm D 05/15/2015 5:32 PM

## 2015-05-16 ENCOUNTER — Inpatient Hospital Stay (HOSPITAL_COMMUNITY): Payer: BLUE CROSS/BLUE SHIELD

## 2015-05-16 DIAGNOSIS — R569 Unspecified convulsions: Secondary | ICD-10-CM | POA: Diagnosis present

## 2015-05-16 LAB — BASIC METABOLIC PANEL
Anion gap: 6 (ref 5–15)
BUN: 19 mg/dL (ref 6–20)
CALCIUM: 8.7 mg/dL — AB (ref 8.9–10.3)
CO2: 28 mmol/L (ref 22–32)
Chloride: 112 mmol/L — ABNORMAL HIGH (ref 101–111)
Creatinine, Ser: 0.86 mg/dL (ref 0.61–1.24)
GFR calc Af Amer: >60 mL/min (ref >60)
GLUCOSE: 115 mg/dL — AB (ref 65–99)
Potassium: 3.8 mmol/L (ref 3.5–5.1)
Sodium: 146 mmol/L — ABNORMAL HIGH (ref 135–145)

## 2015-05-16 LAB — HIV ANTIBODY (ROUTINE TESTING W REFLEX): HIV SCREEN 4TH GENERATION: NONREACTIVE

## 2015-05-16 LAB — BLOOD CULTURE ID PANEL (REFLEXED)
Acinetobacter baumannii: NOT DETECTED
CANDIDA KRUSEI: NOT DETECTED
CARBAPENEM RESISTANCE: NOT DETECTED
Candida albicans: NOT DETECTED
Candida glabrata: NOT DETECTED
Candida parapsilosis: NOT DETECTED
Candida tropicalis: NOT DETECTED
ENTEROBACTER CLOACAE COMPLEX: NOT DETECTED
ENTEROCOCCUS SPECIES: NOT DETECTED
ESCHERICHIA COLI: NOT DETECTED
Enterobacteriaceae species: NOT DETECTED
Haemophilus influenzae: NOT DETECTED
Klebsiella oxytoca: NOT DETECTED
Klebsiella pneumoniae: NOT DETECTED
LISTERIA MONOCYTOGENES: NOT DETECTED
METHICILLIN RESISTANCE: NOT DETECTED
NEISSERIA MENINGITIDIS: NOT DETECTED
PROTEUS SPECIES: NOT DETECTED
Pseudomonas aeruginosa: NOT DETECTED
SERRATIA MARCESCENS: NOT DETECTED
STAPHYLOCOCCUS SPECIES: DETECTED — AB
STREPTOCOCCUS SPECIES: NOT DETECTED
Staphylococcus aureus (BCID): NOT DETECTED
Streptococcus agalactiae: NOT DETECTED
Streptococcus pneumoniae: NOT DETECTED
Streptococcus pyogenes: NOT DETECTED
Vancomycin resistance: NOT DETECTED

## 2015-05-16 LAB — CBC
HCT: 41.4 % (ref 39.0–52.0)
Hemoglobin: 13.5 g/dL (ref 13.0–17.0)
MCH: 28.8 pg (ref 26.0–34.0)
MCHC: 32.6 g/dL (ref 30.0–36.0)
MCV: 88.3 fL (ref 78.0–100.0)
PLATELETS: 82 10*3/uL — AB (ref 150–400)
RBC: 4.69 MIL/uL (ref 4.22–5.81)
RDW: 14.4 % (ref 11.5–15.5)
WBC: 10.6 10*3/uL — ABNORMAL HIGH (ref 4.0–10.5)

## 2015-05-16 LAB — PHOSPHORUS: Phosphorus: 3.2 mg/dL (ref 2.5–4.6)

## 2015-05-16 LAB — MAGNESIUM: Magnesium: 2.2 mg/dL (ref 1.7–2.4)

## 2015-05-16 MED ORDER — GADOBENATE DIMEGLUMINE 529 MG/ML IV SOLN
20.0000 mL | Freq: Once | INTRAVENOUS | Status: AC | PRN
  Administered 2015-05-16: 20 mL via INTRAVENOUS

## 2015-05-16 MED ORDER — VALPROATE SODIUM 500 MG/5ML IV SOLN
500.0000 mg | Freq: Two times a day (BID) | INTRAVENOUS | Status: DC
  Administered 2015-05-16 – 2015-05-28 (×26): 500 mg via INTRAVENOUS
  Filled 2015-05-16 (×30): qty 5

## 2015-05-16 NOTE — Care Management Note (Signed)
Case Management Note  Patient Details  Name: Chase Hampton MRN: [id number] Date of Birth: 07/28/1990  Subjective/Objective:   Pt admitted on 05/15/15 with seizure activity.  PTA, pt was being held at [facility], and was independent.     Action/Plan: Will follow for discharge planning needs as pt progresses.  Currently, pt remains sedated and on ventilator.    Expected Discharge Date:                  Expected Discharge Plan:  [facility]rections Facility  In-House Referral:  Clinical Social Work  Discharge planning Services  CM Consult  Post Acute Care Choice:    Choice offered to:     DME Arranged:    DME Agency:     HH Arranged:    HH Agency:     Status of Service:  In process, will continue to follow  Medicare Important Message Given:    Date Medicare IM Given:    Medicare IM give by:    Date Additional Medicare IM Given:    Additional Medicare Important Message give by:     If discussed at Long Length of Stay Meetings, dates discussed:    Additional Comments:  Quintella Baton, RN, BSN  Trauma/Neuro ICU Case Manager [phone]

## 2015-05-16 NOTE — Progress Notes (Signed)
EEG tech left msg with secretary that she would not be able to place continuous EEG leads on pt today, it will be done tomorrow am (05/17/2015).

## 2015-05-16 NOTE — Procedures (Signed)
HPI:  25 y/o with seizure and unresponsiveness  TECHNICAL SUMMARY:  A multichannel referential and bipolar montage EEG using the standard international 10-20 system was performed on the patient described as minimally responsive.  During the minimal portion of the recording in which there was waking rhythm, there is an occipital dominant rhythm of 7 hertz activity.  Low voltage fast (beta) activity is distributed symmetrically and maximally over the anterior head regions.  ACTIVATION:  Stepwise photic stimulation and HV were not done  EPILEPTIFORM ACTIVITY:  There were no spikes, sharp waves or paroxysmal activity.  SLEEP:  Most of the recording is spent in physiologic drowsiness and sleep.    CARDIAC:  The EKG lead tachycardic sinus rhythm.  IMPRESSION:  This EEG demonstrated no focal, hemispheric, or lateralizing features.  During the portion of the recording in which the patient was most awake, there was a mild diffuse slowing of electrocerebral activity, which can be seen in a wide variety of encephalopathic states including those of a toxic, metabolic, or degenerative nature.  This also can be caused by extreme drowsiness, and the patient fell quickly into further stages of sleep (and then was given 2 mg of Ativan toward the end of the recording).  There was no epileptiform activity recorded.  There were no electrographic seizures.

## 2015-05-16 NOTE — Progress Notes (Signed)
Subjective: Intubated and off sedation. Not responding to external stimuli  Objective: Current vital signs: BP 121/75 mmHg  Pulse 115  Temp(Src) 99.7 F (37.6 C) (Core (Comment))  Resp 28  Ht  (1.803 m)  Wt 120.5 kg (265 lb 10.5 oz)  BMI 37.07 kg/m2  SpO2 99% Vital signs in last 24 hours: Temp:  [97.3 F (36.3 C)-100.4 F (38 C)] 99.7 F (37.6 C) (02/20 0800) Pulse Rate:  [100-119] 115 (02/20 0843) Resp:  [0-30] 28 (02/20 0843) BP: (92-172)/(35-96) 121/75 mmHg (02/20 0843) SpO2:  [97 %-100 %] 99 % (02/20 0843) FiO2 (%):  [40 %-75 %] 40 % (02/20 0843) Weight:  [119.296 kg (263 lb)-120.5 kg (265 lb 10.5 oz)] 120.5 kg (265 lb 10.5 oz) (02/20 0445)  Intake/Output from previous day: 02/19 0701 - 02/20 0700 In: 2453.4 [I.V.:1893.4; IV Piggyback:560] Out: 5900 [Urine:5900] Intake/Output this shift: Total I/O In: 307.2 [I.V.:307.2] Out: -  Nutritional status: Diet NPO time specified  Neurologic Exam: General: NAD Mental Status: Intubated and off all sedation. grimaces to noxious stimuli on plantar surface of feet.  Cranial Nerves: II:  No blink to threat, pupils equal, round, reactive to light and accommodation III,IV, VI: eyes closed, dolls absent V,VII: face symmetric, corneal's intact VIII: unable to assess IX,X: intubated XI: unable to asses XII: intubated  Motor: Waxy rigidity and tends to hold posture whatever position UE are placed.  Sensory: as above with plantar stimulation Deep Tendon Reflexes:  2+ throughout UE, KJ and AJ Plantars: downgoing bilaterally Cerebellar: Unable to assess Gait: not able to assess    Lab Results: Basic Metabolic Panel:  Recent Labs Lab 05/15/15 1113 05/16/15 0331  NA 148* 146*  K 4.5 3.8  CL 108 112*  CO2 27 28  GLUCOSE 107* 115*  BUN 34* 19  CREATININE 1.06 0.86  CALCIUM 9.8 8.7*  MG  --  2.2  PHOS  --  3.2    Liver Function Tests:  Recent Labs Lab 05/15/15 1113  AST 184*  ALT 92*  ALKPHOS 65   BILITOT 1.5*  PROT 7.4  ALBUMIN 3.9   No results for input(s): LIPASE, AMYLASE in the last 168 hours. No results for input(s): AMMONIA in the last 168 hours.  CBC:  Recent Labs Lab 05/15/15 1113 05/15/15 1751 05/16/15 0331  WBC 16.7* 13.6* 10.6*  NEUTROABS 14.1*  --   --   HGB 16.6 14.2 13.5  HCT 49.3 43.7 41.4  MCV 84.0 87.8 88.3  PLT 123* 86* 82*    Cardiac Enzymes:  Recent Labs Lab 05/15/15 1113  CKTOTAL 7546*  TROPONINI 0.05*    Lipid Panel: No results for input(s): CHOL, TRIG, HDL, CHOLHDL, VLDL, LDLCALC in the last 168 hours.  CBG:  Recent Labs Lab 05/15/15 1248  GLUCAP 87    Microbiology: Results for orders placed or performed during the hospital encounter of 05/15/15  MRSA PCR Screening     Status: None   Collection Time: 05/15/15  3:49 PM  Result Value Ref Range Status   MRSA by PCR NEGATIVE NEGATIVE Final    Comment:        The GeneXpert MRSA Assay (FDA approved for NASAL specimens only), is one component of a comprehensive MRSA colonization surveillance program. It is not intended to diagnose MRSA infection nor to guide or monitor treatment for MRSA infections.     Coagulation Studies:  Recent Labs  05/15/15 1618  LABPROT 16.7*  INR 1.34    Imaging: Ct Head Wo Contrast  05/15/2015  CLINICAL DATA:  Patient comes from [facility] jail. Patient has been on suicide watch. They have been trying to wean him off Ativan. When EMS arrived patient was unresponsive. Pupils were non reactive. Seizures. EXAM: CT HEAD WITHOUT CONTRAST TECHNIQUE: Contiguous axial images were obtained from the base of the skull through the vertex without intravenous contrast. COMPARISON:  04/30/2015 MRI FINDINGS: There is no intra or extra-axial fluid collection or mass lesion. The basilar cisterns and ventricles have a normal appearance. There is no CT evidence for acute infarction or hemorrhage. Bone windows show mucoperiosteal thickening of the right maxillary  sinus. No acute calvarial abnormality. IMPRESSION: No evidence for acute intracranial abnormality. Mild changes of chronic sinusitis. Electronically Signed   By: Norva Pavlov M.D.   On: 05/15/2015 14:28   Ct Abdomen Pelvis W Contrast  05/15/2015  CLINICAL DATA:  Patient comes from [facility] jail. Patient has been on suicide watch. They have been trying to wean him off Ativan. When EMS arrived patient was unresponsive. Pupils were non reactive. Seizures. EXAM: CT ABDOMEN AND PELVIS WITH CONTRAST TECHNIQUE: Multidetector CT imaging of the abdomen and pelvis was performed using the standard protocol following bolus administration of intravenous contrast. CONTRAST:  OMNIPAQUE IOHEXOL 300 MG/ML  SOLN COMPARISON:  None. FINDINGS: Lower chest: There patchy densities at the lung bases bilaterally, consistent atelectasis or early infiltrates. Heart size is normal. No imaged pericardial effusion or significant coronary artery calcifications. Upper abdomen: Artifact from the patient's arms at his sides. No focal abnormality identified within the liver, spleen, pancreas, or adrenal glands. Gallbladder is present. Kidneys show symmetric bilateral enhancement. No hydronephrosis or renal mass. Gastrointestinal tract: Nasogastric tube in place to the stomach. Stomach has a normal appearance. Small bowel loops are normal in appearance. The appendix is well seen and has a normal appearance. Colonic loops are normal in appearance. Pelvis: Foley catheter decompresses the bladder. Air is identified within the urinary bladder. No free pelvic fluid. Retroperitoneum: No evidence for aortic aneurysm. No retroperitoneal or mesenteric adenopathy. Abdominal wall: Unremarkable. Osseous structures: Unremarkable. IMPRESSION: No evidence for acute  abnormality. Patchy densities at the lung bases bilaterally, left greater than right raising the question of early infiltrate versus atelectasis. Foley catheter.  Nasogastric tube.  Electronically Signed   By: Norva Pavlov M.D.   On: 05/15/2015 14:34   Dg Chest Port 1 View  05/16/2015  CLINICAL DATA:  25 year old male with respiratory failure. Subsequent encounter. EXAM: PORTABLE CHEST 1 VIEW COMPARISON:  05/15/2015. FINDINGS: Endotracheal tube tip 3 cm above the carina. Nasogastric tube courses below the diaphragm. Tip is not included on the present exam. Consolidation left base may represent atelectasis, infiltrate and/ or pleural effusion. Follow-up until clearance recommended to exclude underlying abnormality. Mild central pulmonary vascular prominence. No gross pneumothorax. Heart size top-normal. IMPRESSION: Consolidation left base may represent atelectasis. Infiltrate and/or pleural effusions secondary considerations. Electronically Signed   By: Lacy Duverney M.D.   On: 05/16/2015 07:33   Dg Chest Port 1 View  05/15/2015  CLINICAL DATA:  Acute respiratory failure. Unresponsive. Intubated. EXAM: PORTABLE CHEST 1 VIEW COMPARISON:  None. FINDINGS: 1920 hours. Endotracheal tube tip terminates 2.2 cm above the carina. Nasogastric tube projects below the diaphragm, tip at the level of the distal stomach. There are low lung volumes with left-greater-than-right basilar airspace opacities. No pneumothorax or significant pleural effusion identified. There is no evidence of rib fracture. IMPRESSION: 1. The endotracheal and nasogastric tubes are satisfactorily positioned. 2. Left-greater-than-right basilar airspace  opacities suspicious for possible aspiration. Electronically Signed   By: Carey Bullocks M.D.   On: 05/15/2015 19:38   Dg Chest Portable 1 View  05/15/2015  CLINICAL DATA:  Unresponsive EXAM: PORTABLE CHEST 1 VIEW COMPARISON:  05/15/2015 1200 hours FINDINGS: Endotracheal tube placed. Tip is 2.5 cm from the carina. Low volumes. Bibasilar atelectasis. No pneumothorax. Normal heart size. IMPRESSION: Endotracheal tube placed.  Bibasilar atelectasis persists Electronically Signed    By: Jolaine Click M.D.   On: 05/15/2015 13:37   Dg Chest Portable 1 View  05/15/2015  CLINICAL DATA:  Per EMS, Patient comes from [facility] jail where they have been trying to ween him off Ativan. When EMS arrived patient was unresponsive. Pupils were non reactive. Patient was seizing, EMS is not sure how long he had been seizing prior to arrival. EXAM: PORTABLE CHEST 1 VIEW COMPARISON:  07/23/2014 FINDINGS: Heart, mediastinum hila are unremarkable. Clear lungs. No pleural effusion or pneumothorax. Skeletal structures are unremarkable. IMPRESSION: No active disease. Electronically Signed   By: Amie Portland M.D.   On: 05/15/2015 12:45    Medications:  Scheduled: . antiseptic oral rinse  7 mL Mouth Rinse QID  . asenapine  5 mg Sublingual QHS  . cefTRIAXone (ROCEPHIN)  IV  2 g Intravenous Q12H  . chlorhexidine gluconate  15 mL Mouth Rinse BID  . clonazePAM  0.5 mg Oral QHS  . DULoxetine  60 mg Oral Daily  . levETIRAcetam  1,000 mg Intravenous Q12H  . pantoprazole sodium  40 mg Per Tube Daily  . valproate sodium  500 mg Intravenous Q12H  . vancomycin  1,000 mg Intravenous Q8H    Assessment/Plan: 1. Encephalopathy with waxy rigidity. Also with elevated white count. Lack of fever militates somewhat against a meningitis, but cannot be ruled out without LP. DDx also includes neuroleptic malignant syndrome given elevated CK level. Toxic encephalopathy should also be considered. Also possible would be catatonia secondary to surreptitious/undocumented neuroleptic use or withdrawal from undocumented or surreptitious use of a dopaminergic compound. Catatonia would be atypical for benzodiazepine withdrawal.  2. My preliminary review of the MRI study obtained this afternoon reveals no acute abnormality. Official radiology report is pending.  3. Unable to obtain LP today due to anticoagulant use.  4. EEG demonstrated no focal, hemispheric, or lateralizing features. During the portion of the recording in  which the patient was most awake, there was a mild diffuse slowing of electrocerebral activity, which can be seen in a wide variety of encephalopathic states including those of a toxic, metabolic, or degenerative nature.   Recommendations: 1. Continue Keppra. 2. Continue ceftriaxone.  3. Continue Klonopin.  4. CIWA protocol.  5. Agree with obtaining LP as soon as it is safe following anticoagulant discontinuation.  6. DVT prophylaxis with SCDs.  7. 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 Dosing is 2.5 mg through nasogastric tube q6-8h and titrated up to a maximum dose of 40 mg/day. This can be continued for 10 days after NMS is controlled and then tapered slowly.   Caryl Pina, MD  Felicie Morn PA-C [facility] [phone]  05/16/2015, 11:14 AM

## 2015-05-16 NOTE — Progress Notes (Signed)
EEG completed, results pending. 

## 2015-05-16 NOTE — Progress Notes (Addendum)
PULMONARY / CRITICAL CARE MEDICINE   Name: Chase Hampton MRN: [id number] DOB: 02-Oct-1990    ADMISSION DATE:  05/15/2015 CONSULTATION DATE:  05/15/15  REFERRING MD:  Dr. Normand Sloop   CHIEF COMPLAINT:  Seizure, Acute Respiratory Failure   HISTORY OF PRESENT ILLNESS:  25 y/o M with PMH of GERD, anxiety and depression (prior [facility] hospitalizations) with recent admission to [facility] in [place] from 2/3-2/8 for major depressive disorder with homicidal / suicidal ideations after a recent break up with his fiancee.  Due to restraining order violations, he was transferred from [facility] to [facility] jail were he has been on suicide watch since arrival.  The patient reportedly refused to eat, drink or take medications.  He was reportedly seen lying in the floor and would urinate on himself.  After multiple episodes of urinating on himself, he as asked to mop up the floor which he was able to perform.  Prior to discharge from [facility], notes reflect he had multiple episodes of physically aggressive behavior - including striking his own head on the seclusion room walls.  At discharge he was to continue on Cymbalta, Depakote and Saphris.   Prison staff report the patient refused all medications after arrival.    On 2/19, the patient was noted to have a seizure in jail.  He was transported to [facility] ER via EMS.  It is documented that he arrived to the ER with urine and feces all over him.  There is question if patient was weaning off ativan?? Per [facility] notes but discharge note from [facility] does not include Rx for ativan. Room air saturations were 82%, he was treated with 100% NRB.  He was treated with 4mg  IV versed per EMS in route.  The patient had a second seizure in ER and was ultimately intubated.  He was transferred to [facility] ICU for further evaluation.    The patient arrived on propofol, mechanically intubated.    SUBJECTIVE:  Some tremor or rhythmic movement on the L overnight, suspected to be seizure activity Propofol currently  off, has not waked yet Has MRI brain and LP planned for today  VITAL SIGNS: BP 121/75 mmHg  Pulse 107  Temp(Src) 99.7 F (37.6 C) (Core (Comment))  Resp 0  Ht 5\' 11"  (1.803 m)  Wt 120.5 kg (265 lb 10.5 oz)  BMI 37.07 kg/m2  SpO2 100%  HEMODYNAMICS:    VENTILATOR SETTINGS: Vent Mode:  [-] PRVC FiO2 (%):  [40 %-75 %] 40 % Set Rate:  [14 bmp-15 bmp] 15 bmp Vt Set:  [500 mL-600 mL] 600 mL PEEP:  [5 cmH20] 5 cmH20 Plateau Pressure:  [18 cmH20-20 cmH20] 20 cmH20  INTAKE / OUTPUT: I/O last 3 completed shifts: In: 2453.4 [I.V.:1893.4; IV Piggyback:560] Out: 5900 [Urine:5900]  PHYSICAL EXAMINATION: General:  Well developed adult male in NAD on vent  Neuro:  Unresponsive to voice, stim, pain. No seizure activity obvious at this time.  HEENT:  ETT, mm pink/moist, no jvd Cardiovascular:  s1s2 rrr, no m/r/g Lungs:  Even/non-labored, lungs bilaterally clear  Abdomen:  Obese/soft, bsx4 active  Musculoskeletal:  No acute deformities  Skin:  Cool /dry, old bruising to left thigh  LABS:  BMET  Recent Labs Lab 05/15/15 1113 05/16/15 0331  NA 148* 146*  K 4.5 3.8  CL 108 112*  CO2 27 28  BUN 34* 19  CREATININE 1.06 0.86  GLUCOSE 107* 115*    Electrolytes  Recent Labs Lab 05/15/15 1113 05/16/15 0331  CALCIUM 9.8 8.7*  MG  --  2.2  PHOS  --  3.2    CBC  Recent Labs Lab 05/15/15 1113 05/15/15 1751 05/16/15 0331  WBC 16.7* 13.6* 10.6*  HGB 16.6 14.2 13.5  HCT 49.3 43.7 41.4  PLT 123* 86* 82*    Coag's  Recent Labs Lab 05/15/15 1618  APTT 34  INR 1.34    Sepsis Markers  Recent Labs Lab 05/15/15 1113 05/15/15 1420 05/15/15 1751  LATICACIDVEN 2.0 1.9  --   PROCALCITON  --   --  <0.10    ABG  Recent Labs Lab 05/15/15 1114  PHART 7.49*  PCO2ART 40  PO2ART 101    Liver Enzymes  Recent Labs Lab 05/15/15 1113  AST 184*  ALT 92*  ALKPHOS 65  BILITOT 1.5*  ALBUMIN 3.9    Cardiac Enzymes  Recent Labs Lab 05/15/15 1113   TROPONINI 0.05*    Glucose  Recent Labs Lab 05/15/15 1248  GLUCAP 87    Imaging Ct Head Wo Contrast  05/15/2015  CLINICAL DATA:  Patient comes from [employer] [employer]. Patient has been on suicide watch. They have been trying to wean him off Ativan. When EMS arrived patient was unresponsive. Pupils were non reactive. Seizures. EXAM: CT HEAD WITHOUT CONTRAST TECHNIQUE: Contiguous axial images were obtained from the base of the skull through the vertex without intravenous contrast. COMPARISON:  04/30/2015 MRI FINDINGS: There is no intra or extra-axial fluid collection or mass lesion. The basilar cisterns and ventricles have a normal appearance. There is no CT evidence for acute infarction or hemorrhage. Bone windows show mucoperiosteal thickening of the right maxillary sinus. No acute calvarial abnormality. IMPRESSION: No evidence for acute intracranial abnormality. Mild changes of chronic sinusitis. Electronically Signed   By: Norva Pavlov M.D.   On: 05/15/2015 14:28   Ct Abdomen Pelvis W Contrast  05/15/2015  CLINICAL DATA:  Patient comes from [employer] jail. Patient has been on suicide watch. They have been trying to wean him off Ativan. When EMS arrived patient was unresponsive. Pupils were non reactive. Seizures. EXAM: CT ABDOMEN AND PELVIS WITH CONTRAST TECHNIQUE: Multidetector CT imaging of the abdomen and pelvis was performed using the standard protocol following bolus administration of intravenous contrast. CONTRAST:  OMNIPAQUE IOHEXOL 300 MG/ML  SOLN COMPARISON:  None. FINDINGS: Lower chest: There patchy densities at the lung bases bilaterally, consistent atelectasis or early infiltrates. Heart size is normal. No imaged pericardial effusion or significant coronary artery calcifications. Upper abdomen: Artifact from the patient's arms at his sides. No focal abnormality identified within the liver, spleen, pancreas, or adrenal glands. Gallbladder is present. Kidneys show symmetric  bilateral enhancement. No hydronephrosis or renal mass. Gastrointestinal tract: Nasogastric tube in place to the stomach. Stomach has a normal appearance. Small bowel loops are normal in appearance. The appendix is well seen and has a normal appearance. Colonic loops are normal in appearance. Pelvis: Foley catheter decompresses the bladder. Air is identified within the urinary bladder. No free pelvic fluid. Retroperitoneum: No evidence for aortic aneurysm. No retroperitoneal or mesenteric adenopathy. Abdominal wall: Unremarkable. Osseous structures: Unremarkable. IMPRESSION: No evidence for acute  abnormality. Patchy densities at the lung bases bilaterally, left greater than right raising the question of early infiltrate versus atelectasis. Foley catheter.  Nasogastric tube. Electronically Signed   By: Norva Pavlov M.D.   On: 05/15/2015 14:34   Dg Chest Port 1 View  05/16/2015  CLINICAL DATA:  25 year old male with respiratory failure. Subsequent encounter. EXAM: PORTABLE CHEST 1 VIEW COMPARISON:  05/15/2015. FINDINGS:  Endotracheal tube tip 3 cm above the carina. Nasogastric tube courses below the diaphragm. Tip is not included on the present exam. Consolidation left base may represent atelectasis, infiltrate and/ or pleural effusion. Follow-up until clearance recommended to exclude underlying abnormality. Mild central pulmonary vascular prominence. No gross pneumothorax. Heart size top-normal. IMPRESSION: Consolidation left base may represent atelectasis. Infiltrate and/or pleural effusions secondary considerations. Electronically Signed   By: Lacy Duverney M.D.   On: 05/16/2015 07:33   Dg Chest Port 1 View  05/15/2015  CLINICAL DATA:  Acute respiratory failure. Unresponsive. Intubated. EXAM: PORTABLE CHEST 1 VIEW COMPARISON:  None. FINDINGS: 1920 hours. Endotracheal tube tip terminates 2.2 cm above the carina. Nasogastric tube projects below the diaphragm, tip at the level of the distal stomach. There  are low lung volumes with left-greater-than-right basilar airspace opacities. No pneumothorax or significant pleural effusion identified. There is no evidence of rib fracture. IMPRESSION: 1. The endotracheal and nasogastric tubes are satisfactorily positioned. 2. Left-greater-than-right basilar airspace opacities suspicious for possible aspiration. Electronically Signed   By: Carey Bullocks M.D.   On: 05/15/2015 19:38   Dg Chest Portable 1 View  05/15/2015  CLINICAL DATA:  Unresponsive EXAM: PORTABLE CHEST 1 VIEW COMPARISON:  05/15/2015 1200 hours FINDINGS: Endotracheal tube placed. Tip is 2.5 cm from the carina. Low volumes. Bibasilar atelectasis. No pneumothorax. Normal heart size. IMPRESSION: Endotracheal tube placed.  Bibasilar atelectasis persists Electronically Signed   By: Jolaine Click M.D.   On: 05/15/2015 13:37   Dg Chest Portable 1 View  05/15/2015  CLINICAL DATA:  Per EMS, Patient comes from [facility] jail where they have been trying to ween him off Ativan. When EMS arrived patient was unresponsive. Pupils were non reactive. Patient was seizing, EMS is not sure how long he had been seizing prior to arrival. EXAM: PORTABLE CHEST 1 VIEW COMPARISON:  07/23/2014 FINDINGS: Heart, mediastinum hila are unremarkable. Clear lungs. No pleural effusion or pneumothorax. Skeletal structures are unremarkable. IMPRESSION: No active disease. Electronically Signed   By: Amie Portland M.D.   On: 05/15/2015 12:45    STUDIES:  LP 2/20 >>  MRI brain 2/20 >>  EEG 2/20 >>   CULTURES: LP 2/19 >>   ANTIBIOTICS: Rocephin 2gm 2/19 >>  Vanco 2/20 >>   SIGNIFICANT EVENTS: 2/19  Admit from jail with seizure, intubated   LINES/TUBES: ETT 2/19 >>   DISCUSSION: 25 y/o M with PMH of major depression with multiple PSY admissions admitted 2/19 with seizure (no hx). Intubated in ER and transferred to [facility] ICU.    ASSESSMENT / PLAN:  PULMONARY A: Acute Hypoxic Respiratory Failure - in setting of seizure,  brief hypoxia, resolved  P:   PRVC 8cc/kg Wean PEEP / FiO2 for sats > 92%  CARDIOVASCULAR A:  Hypotension - mild, suspect sedation related  P:  ICU monitoring of hemodynamics  D51/2 NS @ 167ml/hr  RENAL A:   Rhabdomyolysis - admit CK >7500 At Risk AKI - in setting of rhabdo, hypotension P:   Ensure adequate hydration  Trend BMP / UOP Monitor CK  Replace electrolytes as indicated   GASTROINTESTINAL A:   Obesity  P:   NPO / OGT Begin TF 2/21 SUP while on MV  HEMATOLOGIC A:   Leukocytosis  Thrombocytopenia  P:  Trend CBC  D/c enoxaparin and use SCD  INFECTIOUS A:   R/O Meningitis - doubt given history, no fever or nuchal rigidity.  Follows commands on propofol  P:   LP per Neurology recs  from [facility] ABX / Cultures as above Follow WBC / fever curve  2gm dosing of rocephin for now Added vanco 2/20  ENDOCRINE A:   Mild hyperglycemia  P:   Trend glucose on BMP   NEUROLOGIC A:   Seizure - suspect related to abrupt cessation of medications +/- benzo withdrawal.  R/O Meningitis  P:   RASS goal: -1 to 2 Will consult Neuro to see here at [facility] Propofol for sedation  PRN fentanyl for pain  Continue home saphris, klonopin, depakote, cymbalta  Ativan PRN seizure WUA qam    FAMILY  - Updates:  Will review status with family today 2/20  - Inter-disciplinary family meet or Palliative Care meeting due by: 2/26  Independent Cc time 35 minutes  Levy Pupa, MD, PhD 05/16/2015, 10:02 AM  Pulmonary and Critical Care [phone] or if no answer [phone]

## 2015-05-17 ENCOUNTER — Inpatient Hospital Stay (HOSPITAL_COMMUNITY): Payer: BLUE CROSS/BLUE SHIELD

## 2015-05-17 DIAGNOSIS — J9600 Acute respiratory failure, unspecified whether with hypoxia or hypercapnia: Secondary | ICD-10-CM | POA: Diagnosis present

## 2015-05-17 DIAGNOSIS — R7881 Bacteremia: Secondary | ICD-10-CM

## 2015-05-17 DIAGNOSIS — G9340 Encephalopathy, unspecified: Secondary | ICD-10-CM | POA: Diagnosis present

## 2015-05-17 DIAGNOSIS — F6381 Intermittent explosive disorder: Secondary | ICD-10-CM | POA: Diagnosis present

## 2015-05-17 DIAGNOSIS — Z9911 Dependence on respirator [ventilator] status: Secondary | ICD-10-CM

## 2015-05-17 DIAGNOSIS — R45850 Homicidal ideations: Secondary | ICD-10-CM | POA: Diagnosis present

## 2015-05-17 DIAGNOSIS — R45851 Suicidal ideations: Secondary | ICD-10-CM | POA: Diagnosis present

## 2015-05-17 DIAGNOSIS — F3130 Bipolar disorder, current episode depressed, mild or moderate severity, unspecified: Secondary | ICD-10-CM | POA: Diagnosis present

## 2015-05-17 LAB — BASIC METABOLIC PANEL
Anion gap: 8 (ref 5–15)
BUN: 15 mg/dL (ref 6–20)
CHLORIDE: 106 mmol/L (ref 101–111)
CO2: 31 mmol/L (ref 22–32)
CREATININE: 0.95 mg/dL (ref 0.61–1.24)
Calcium: 8.6 mg/dL — ABNORMAL LOW (ref 8.9–10.3)
GFR calc Af Amer: >60 mL/min (ref >60)
GFR calc non Af Amer: >60 mL/min (ref >60)
GLUCOSE: 115 mg/dL — AB (ref 65–99)
POTASSIUM: 3.9 mmol/L (ref 3.5–5.1)
SODIUM: 145 mmol/L (ref 135–145)

## 2015-05-17 LAB — CBC
HEMATOCRIT: 39.7 % (ref 39.0–52.0)
Hemoglobin: 13.1 g/dL (ref 13.0–17.0)
MCH: 28.8 pg (ref 26.0–34.0)
MCHC: 33 g/dL (ref 30.0–36.0)
MCV: 87.3 fL (ref 78.0–100.0)
PLATELETS: 66 10*3/uL — AB (ref 150–400)
RBC: 4.55 MIL/uL (ref 4.22–5.81)
RDW: 14.1 % (ref 11.5–15.5)
WBC: 10.4 10*3/uL (ref 4.0–10.5)

## 2015-05-17 LAB — CSF CELL COUNT WITH DIFFERENTIAL
RBC COUNT CSF: 370 /mm3 — AB
RBC Count, CSF: 22 /mm3 — ABNORMAL HIGH
Tube #: 1
Tube #: 4
WBC CSF: 2 /mm3 (ref 0–5)
WBC CSF: 2 /mm3 (ref 0–5)

## 2015-05-17 LAB — GLUCOSE, CSF: GLUCOSE CSF: 63 mg/dL (ref 40–70)

## 2015-05-17 LAB — CRYPTOCOCCAL ANTIGEN, CSF: CRYPTO AG: NEGATIVE

## 2015-05-17 LAB — TRIGLYCERIDES: Triglycerides: 101 mg/dL (ref <150)

## 2015-05-17 LAB — VANCOMYCIN, TROUGH: VANCOMYCIN TR: 7 ug/mL — AB (ref 10.0–20.0)

## 2015-05-17 LAB — VALPROIC ACID LEVEL: Valproic Acid Lvl: 52 ug/mL (ref 50.0–100.0)

## 2015-05-17 LAB — GLUCOSE, CAPILLARY: Glucose-Capillary: 101 mg/dL — ABNORMAL HIGH (ref 65–99)

## 2015-05-17 LAB — PROTEIN, CSF: Total  Protein, CSF: 60 mg/dL — ABNORMAL HIGH (ref 15–45)

## 2015-05-17 MED ORDER — MIDAZOLAM HCL 2 MG/2ML IJ SOLN
INTRAMUSCULAR | Status: AC
  Filled 2015-05-17: qty 4

## 2015-05-17 MED ORDER — VITAL HIGH PROTEIN PO LIQD
1000.0000 mL | ORAL | Status: DC
Start: 2015-05-17 — End: 2015-05-24
  Administered 2015-05-17 – 2015-05-22 (×6): 1000 mL

## 2015-05-17 MED ORDER — MIDAZOLAM HCL 2 MG/2ML IJ SOLN
2.0000 mg | Freq: Once | INTRAMUSCULAR | Status: AC
  Administered 2015-05-17: 2 mg via INTRAVENOUS

## 2015-05-17 MED ORDER — SODIUM CHLORIDE 0.9 % IV SOLN: INTRAVENOUS | Status: DC

## 2015-05-17 MED ORDER — VANCOMYCIN HCL 10 G IV SOLR
1500.0000 mg | Freq: Three times a day (TID) | INTRAVENOUS | Status: DC
  Administered 2015-05-17 – 2015-05-18 (×3): 1500 mg via INTRAVENOUS
  Filled 2015-05-17 (×4): qty 1500

## 2015-05-17 MED ORDER — PRO-STAT SUGAR FREE PO LIQD
60.0000 mL | Freq: Two times a day (BID) | ORAL | Status: DC
  Administered 2015-05-17 – 2015-05-24 (×14): 60 mL
  Filled 2015-05-17 (×14): qty 60

## 2015-05-17 MED ORDER — FENTANYL CITRATE (PF) 100 MCG/2ML IJ SOLN
50.0000 ug | Freq: Once | INTRAMUSCULAR | Status: AC
  Administered 2015-05-17: 50 ug via INTRAVENOUS

## 2015-05-17 NOTE — Progress Notes (Signed)
TEE note Indication: Bacteremia Patient intubated at time of procedure; additional sedation with versed 2 mg and fentanyl 50 micrograms IV Omniplane probe passed without difficulty  Normal LV function; trace MR and TR; no vegetations Full report to follow  Milus Height, [facility]

## 2015-05-17 NOTE — Progress Notes (Signed)
  Echocardiogram Echocardiogram Transesophageal has been performed.  Delcie Roch 05/17/2015, 1:02 PM

## 2015-05-17 NOTE — Progress Notes (Signed)
TEE done by Dr Jens Som.  Versed and 50 mcg fent given. RN at bedside throughout procedure.

## 2015-05-17 NOTE — Progress Notes (Signed)
PCCM INTERVAL PROGRESS NOTE  Asked to evaluate patient in setting of new rash posterior neck. approx 5cm x 8cm erythematous area with multiple pustules and areas of blister which have ruptured. Attempted to take photo at bedside, however, would not cross over into Epic. Rash appears infectious, suspect bacterial dermatits, however, fungal is in differential. Could be drug related, vanco just started yesterday, but does not appear like a typical vanc skin reaction. Does not appear herpetic and is not dermatomal. Would continue to monitor and continue vancomycin for now.  Could consider adding antifungal topically if does not improve. Nurse to place dry dressing.    Joneen Roach, AGACNP-BC [facility] Pulmonology/Critical Care Pager [phone] or [phone]  05/17/2015 12:54 AM

## 2015-05-17 NOTE — Procedures (Signed)
HPI:  24y/o with encephalopathy, sedated, on ventilator  TECHNICAL SUMMARY:  A multichannel referential and bipolar montage EEG using the standard international 10-20 system was performed on the patient described as minimally responsive.  The EEG is recorded while the patient is sedated on fentanyl.  During the very brief portion of the recording in which the patient is most awake (when the technologist stimulates the patient) there is an 8 Hz occipital dominant rhythm noted.  There is excessive fast activity noted intermittently throughout the recording, which is likely medication effect (often benzodiazepines).  ACTIVATION:  Photic stimulation and hyperventilation are not performed.    EPILEPTIFORM ACTIVITY:  There were no spikes, sharp waves or paroxysmal activity.  SLEEP:  Most of the recording is spent in stage II sleep architecture.  CARDIAC:  The EKG lead revealed a regular sinus rhythm.  IMPRESSION:  This EEG demonstrated no focal, hemispheric, or lateralizing features and was within normal limits for a sedated EEG.  There was very little waking time on the recording, but during the brief period in which he was stimulated, the occipital dominant rhythm was normal.  There was no epileptiform activity recorded.  It may be of value to repeat this tracing when the patient is off of sedation.  Correlate clinically.

## 2015-05-17 NOTE — Progress Notes (Signed)
Initial Nutrition Assessment  DOCUMENTATION CODES:   Obesity unspecified  INTERVENTION:  Initiate Vital High Protein @ 20 ml/hr via OG tube and increase by 10 ml every 4 hours to goal rate of 50 ml/hr.   60 ml Prostat BID.    Tube feeding regimen provides 1600 kcal, 165 grams of protein, and 1003 ml of H2O.   NUTRITION DIAGNOSIS:   Inadequate oral intake related to inability to eat as evidenced by NPO status.  GOAL:   Provide needs based on ASPEN/SCCM guidelines  MONITOR:   TF tolerance, Skin, I & O's, Vent status  REASON FOR ASSESSMENT:   Consult Enteral/tube feeding initiation and management  ASSESSMENT:   Pt with hx of GERD, recently at [place] for major depressive DO. Pt was transferred from [facility] to [place] jail where he refused to eat/drink or take meds. Pt noted to have seizure and brought to [facility] where he suffered another seizure and was intubated.    Patient is currently intubated on ventilator support MV: 12.4 L/min Temp (24hrs), Avg:99.8 F (37.7 C), Min:98.8 F (37.1 C), Max:101.3 F (38.5 C)  Labs reviewed. OG tube Nutrition-Focused physical exam completed. Findings are no fat depletion, no muscle depletion, and mild edema.    Diet Order:  Diet NPO time specified  Skin:  Wound (see comment) (Neck with rash and blister)  Last BM:  unknown  Height:   Ht Readings from Last 1 Encounters:  05/15/15  (1.803 m)    Weight:   Wt Readings from Last 1 Encounters:  05/17/15 267 lb 13.7 oz (121.5 kg)    Ideal Body Weight:  78.1 kg  BMI:  Body mass index is 37.38 kg/(m^2).  Estimated Nutritional Needs:   Kcal:  1336-1701  Protein:  >/= 156 grams  Fluid:  > 1.5 L/day  EDUCATION NEEDS:   No education needs identified at this time  Kendell Bane RD, LDN, CNSC [phone] Pager [phone] After Hours Pager

## 2015-05-17 NOTE — Progress Notes (Signed)
Pharmacy Antibiotic Note  Chase Hampton is a 25 y.o. male admitted on 05/15/2015 with bacteremia and meningitis.  Pharmacy has been consulted for vancomycin and ceftriaxone dosing. Tmax is 101.3 and WBC is now WNL. Scr is stable at 0.95. A vancomycin trough was drawn today and is low at 7.   Plan: - Change vancomycin to  IV Q8H - Continue ceftriaxone 2gm IV Q12H - F/u renal fxn, C&S, clinical status and trough at SS  Height:  (180.3 cm) (from [facility] on 05/15/15) Weight: 267 lb 13.7 oz (121.5 kg) IBW/kg (Calculated) : 75.3  Temp (24hrs), Avg:99.8 F (37.7 C), Min:98.8 F (37.1 C), Max:101.3 F (38.5 C)   Recent Labs Lab 05/15/15 1113 05/15/15 1420 05/15/15 1751 05/16/15 0331 05/17/15 0413 05/17/15 0936  WBC 16.7*  --  13.6* 10.6* 10.4  --   CREATININE 1.06  --   --  0.86 0.95  --   LATICACIDVEN 2.0 1.9  --   --   --   --   VANCOTROUGH  --   --   --   --   --  7*    Estimated Creatinine Clearance: 161.8 mL/min (by C-G formula based on Cr of 0.95).    Allergies  Allergen Reactions  . Haldol [Haloperidol Lactate] Other (See Comments)    Unknown reaction [facility] lists no allergies as of 05/15/15)    Antimicrobials this admission: Vanc 2/19>> CTX 2/19>> Acyclovir x 1 2/19  Dose adjustments this admission: 2/21 VT = 7 on 1gm Q8H, changed to  Q8H  Microbiology results: 2/19 Blood - NGTD 2/19 Blood [facility]) - CNS 2/2  Thank you for allowing pharmacy to be a part of this patient's care.  Jazelyn Sipe, Drake Leach 05/17/2015 11:42 AM

## 2015-05-17 NOTE — Progress Notes (Addendum)
PULMONARY / CRITICAL CARE MEDICINE   Name: Chase Hampton MRN: [id number] DOB: March 07, 1991    ADMISSION DATE:  05/15/2015 CONSULTATION DATE:  05/15/15  REFERRING MD:  Dr. Normand Sloop   CHIEF COMPLAINT:  Seizure, Acute Respiratory Failure   HISTORY OF PRESENT ILLNESS:  25 y/o M with PMH of GERD, anxiety and depression (prior [facility] hospitalizations) with recent admission to [facility] in [place] from 2/3-2/8 for major depressive disorder with homicidal / suicidal ideations after a recent break up with his fiancee.  Due to restraining order violations, he was transferred from [facility] to [facility] jail were he has been on suicide watch since arrival.  The patient reportedly refused to eat, drink or take medications.  He was reportedly seen lying in the floor and would urinate on himself.  After multiple episodes of urinating on himself, he as asked to mop up the floor which he was able to perform.  Prior to discharge from [facility], notes reflect he had multiple episodes of physically aggressive behavior - including striking his own head on the seclusion room walls.  At discharge he was to continue on Cymbalta, Depakote and Saphris.   Prison staff report the patient refused all medications after arrival.    On 2/19, the patient was noted to have a seizure in jail.  He was transported to [facility] ER via EMS.  It is documented that he arrived to the ER with urine and feces all over him.  There is question if patient was weaning off ativan?? Per [facility] notes but discharge note from [facility] does not include Rx for ativan. Room air saturations were 82%, he was treated with 100% NRB.  He was treated with 4mg  IV versed per EMS in route.  The patient had a second seizure in ER and was ultimately intubated.  He was transferred to [facility] ICU for further evaluation.    The patient arrived on propofol, mechanically intubated.    SUBJECTIVE:  Has had purposeful movement per RN Note rash on posterior neck  VITAL SIGNS: BP 106/55 mmHg  Pulse 87   Temp(Src) 99.5 F (37.5 C) (Axillary)  Resp 19  Ht 5\' 11"  (1.803 m)  Wt 121.5 kg (267 lb 13.7 oz)  BMI 37.38 kg/m2  SpO2 100%  HEMODYNAMICS:    VENTILATOR SETTINGS: Vent Mode:  [-] PSV;CPAP FiO2 (%):  [40 %] 40 % Set Rate:  [15 bmp] 15 bmp Vt Set:  [600 mL] 600 mL PEEP:  [5 cmH20] 5 cmH20 Pressure Support:  [5 cmH20] 5 cmH20 Plateau Pressure:  [19 cmH20-20 cmH20] 19 cmH20  INTAKE / OUTPUT: I/O last 3 completed shifts: In: 6011.2 [I.V.:4591.2; NG/GT:30; IV Piggyback:1390] Out: 1750 [Urine:1750]  PHYSICAL EXAMINATION: General:  Well developed adult male in NAD on vent  Neuro:  No seizure activity obvious at this time. Opened eyes to voice and nodded to questions HEENT:  ETT, mm pink/moist, no jvd, erythematous rash posterior neck Cardiovascular:  s1s2 rrr, no m/r/g Lungs:  Even/non-labored, lungs bilaterally clear  Abdomen:  Obese/soft, bsx4 active  Musculoskeletal:  No acute deformities  Skin:  Cool /dry, old bruising to left thigh  LABS:  BMET  Recent Labs Lab 05/15/15 1113 05/16/15 0331 05/17/15 0413  NA 148* 146* 145  K 4.5 3.8 3.9  CL 108 112* 106  CO2 27 28 31   BUN 34* 19 15  CREATININE 1.06 0.86 0.95  GLUCOSE 107* 115* 115*    Electrolytes  Recent Labs Lab 05/15/15 1113 05/16/15 0331 05/17/15 0413  CALCIUM 9.8  8.7* 8.6*  MG  --  2.2  --   PHOS  --  3.2  --     CBC  Recent Labs Lab 05/15/15 1751 05/16/15 0331 05/17/15 0413  WBC 13.6* 10.6* 10.4  HGB 14.2 13.5 13.1  HCT 43.7 41.4 39.7  PLT 86* 82* 66*    Coag's  Recent Labs Lab 05/15/15 1618  APTT 34  INR 1.34    Sepsis Markers  Recent Labs Lab 05/15/15 1113 05/15/15 1420 05/15/15 1751  LATICACIDVEN 2.0 1.9  --   PROCALCITON  --   --  <0.10    ABG  Recent Labs Lab 05/15/15 1114  PHART 7.49*  PCO2ART 40  PO2ART 101    Liver Enzymes  Recent Labs Lab 05/15/15 1113  AST 184*  ALT 92*  ALKPHOS 65  BILITOT 1.5*  ALBUMIN 3.9    Cardiac  Enzymes  Recent Labs Lab 05/15/15 1113  TROPONINI 0.05*    Glucose  Recent Labs Lab 05/15/15 1248  GLUCAP 87    Imaging Mr Laqueta Jean Wo Contrast  05/16/2015  CLINICAL DATA:  25 year old male with witnessed seizure in jail, unresponsive. Query meningitis. Initial encounter. EXAM: MRI HEAD WITHOUT AND WITH CONTRAST TECHNIQUE: Multiplanar, multiecho pulse sequences of the brain and surrounding structures were obtained without and with intravenous contrast. CONTRAST:  20mL MULTIHANCE GADOBENATE DIMEGLUMINE 529 MG/ML IV SOLN COMPARISON:  [facility] head CT without contrast 04/1915. Brain MRI without and with contrast 04/30/2015, and earlier. FINDINGS: Major intracranial vascular flow voids are stable. No restricted diffusion to suggest acute infarction. No midline shift, mass effect, evidence of mass lesion, ventriculomegaly, extra-axial collection or acute intracranial hemorrhage. Cervicomedullary junction and pituitary are within normal limits. Wallace Cullens and white matter signal remains normal. Hippocampal formations appear stable and within normal limits. No abnormal enhancement identified. No dural thickening or leptomeningeal enhancement is identified. Stable enhancement of the major intracranial venous structures. Visible internal auditory structures appear normal. Mastoids are clear. Mild right maxillary sinus mucosal thickening is unchanged, otherwise paranasal sinuses are clear. Orbit and scalp soft tissues remain within normal limits. Visualized bone marrow signal is within normal limits. Negative visualized cervical spine. IMPRESSION: Stable and normal MRI appearance of the brain. CSF analysis would be necessary to exclude meningitis. Electronically Signed   By: Odessa Fleming M.D.   On: 05/16/2015 17:51   Dg Chest Port 1 View  05/17/2015  CLINICAL DATA:  Acute respiratory failure EXAM: PORTABLE CHEST 1 VIEW COMPARISON:  05/16/2015 FINDINGS: Endotracheal tube and NG tube are  unchanged. There are bibasilar opacities, likely atelectasis. Suspect small left pleural effusion. Heart is borderline in size. No acute bony abnormality. IMPRESSION: Bibasilar opacities, likely atelectasis.  Small left effusion. Electronically Signed   By: Charlett Nose M.D.   On: 05/17/2015 07:40    STUDIES:  LP 2/20 >>  MRI brain 2/20 >> cancelled  EEG 2/20 >>  No active seizures.   CULTURES: LP 2/21 >>   ANTIBIOTICS: Rocephin 2gm 2/19 >>  Vanco 2/20 >>   SIGNIFICANT EVENTS: 2/19  Admit from jail with seizure, intubated   LINES/TUBES: ETT 2/19 >>   DISCUSSION: 25 y/o M with PMH of major depression with multiple PSY admissions admitted 2/19 with seizure (no hx). Intubated in ER and transferred to [facility] ICU.    ASSESSMENT / PLAN:  PULMONARY A: Acute Hypoxic Respiratory Failure - in setting of seizure, brief hypoxia, resolved  P:   PRVC 8cc/kg Wean PEEP / FiO2 for sats >  92% WUA and SBT after LP on 2/21, hopefully extubate soon after  CARDIOVASCULAR A:  Hypotension - mild, suspect sedation related  P:  ICU monitoring of hemodynamics  D51/2 NS @ KVO  RENAL A:   Rhabdomyolysis - admit CK >7500 At Risk AKI - in setting of rhabdo, hypotension P:   Ensure adequate hydration  Trend BMP / UOP Monitor CK  Replace electrolytes as indicated   GASTROINTESTINAL A:   Obesity  P:   NPO / OGT Begin TF 2/21 SUP while on MV  HEMATOLOGIC A:   Leukocytosis  Thrombocytopenia  P:  Trend CBC  D/c'd enoxaparin and use SCD  INFECTIOUS A:   R/O Meningitis - doubt given history, no fever or nuchal rigidity.  Follows commands on propofol  Contact rash, posterior neck without clear evidence cellulitis P:   LP per Neurology recs from [facility] ABX / Cultures as above Follow WBC / fever curve  2gm dosing of rocephin for now Added vanco 2/20 vanco for neck rash, consider topical antifungals depending on progress  ENDOCRINE A:   Mild hyperglycemia  P:   Trend glucose on BMP    NEUROLOGIC A:   Seizure - suspect related to abrupt cessation of medications +/- benzo withdrawal.  R/O Meningitis  P:   RASS goal: 0 to -1 Propofol for sedation  PRN fentanyl for pain  Continue home saphris, klonopin, depakote, cymbalta  Ativan PRN seizure WUA qam    FAMILY  - Updates:  Will review status with family 2/20  - Inter-disciplinary family meet or Palliative Care meeting due by: 2/26  Independent Cc time 35 minutes  Levy Pupa, MD, PhD 05/17/2015, 9:30 AM [facility] [phone] or if no answer [phone]

## 2015-05-17 NOTE — Progress Notes (Signed)
PCCM Interval Note  Notified that [facility] blood cx's show 2 of 2 coag neg staph. Suspect he will need TEE, but will start w TTE and ID consultation.   Levy Pupa, MD, PhD 05/17/2015, 9:45 AM [place] [facility] [phone] or if no answer [phone]

## 2015-05-17 NOTE — Progress Notes (Signed)
Bedside EEG completed, results pending. 

## 2015-05-17 NOTE — Progress Notes (Signed)
Patient transported to have a lumbar puncture completed without any complications.

## 2015-05-17 NOTE — CV Procedure (Signed)
Intubated patient. Informed consent was obtained from the patient's parents to the procedure, including potential complications of headache, allergy, and pain. With the patient prone, the lower back was prepped with Betadine. 1% Lidocaine was used for local anesthesia. Lumbar puncture was performed at the [L3-L4] level using a [20] gauge needle with return of [clear] CSF with an opening pressure of [21] cm water. [Nine] ml of CSF were obtained for laboratory studies. The patient tolerated the procedure well and there were no apparent complications.

## 2015-05-17 NOTE — Progress Notes (Signed)
Subjective: Slight improvement in ability to follow commands today, per nursing.   Objective: Current vital signs: BP 117/66 mmHg  Pulse 102  Temp(Src) 99.1 F (37.3 C) (Axillary)  Resp 17  Ht 5\' 11"  (1.803 m)  Wt 121.5 kg (267 lb 13.7 oz)  BMI 37.38 kg/m2  SpO2 100% Vital signs in last 24 hours: Temp:  [98.8 F (37.1 C)-101.3 F (38.5 C)] 99.1 F (37.3 C) (02/21 1100) Pulse Rate:  [82-121] 102 (02/21 1100) Resp:  [15-26] 17 (02/21 1100) BP: (99-135)/(44-93) 117/66 mmHg (02/21 1100) SpO2:  [98 %-100 %] 100 % (02/21 1100) FiO2 (%):  [40 %] 40 % (02/21 0753) Weight:  [121.5 kg (267 lb 13.7 oz)] 121.5 kg (267 lb 13.7 oz) (02/21 0500)  Intake/Output from previous day: 02/20 0701 - 02/21 0700 In: 3757.8 [I.V.:2697.8; NG/GT:30; IV Piggyback:1030] Out: 1200 [Urine:1200] Intake/Output this shift: Total I/O In: 575 [I.V.:210; IV Piggyback:365] Out: -  Nutritional status: Diet NPO time specified  Neurologic Exam: General: NAD Mental Status: Follows some commands with difficulty, including opening of eyelids, wiggling toes and gripping of examiners hands. Motor activity is of significantly decreased amplitude and preceded by a delay following initial command.  Cranial Nerves: II: No blink to threat, pupils equal, round, reactive to light and accommodation III,IV, VI: eyes closed, dolls absent V,VII: face symmetric, corneal's intact VIII: unable to assess IX,X: intubated XI: unable to asses XII: intubated Motor: Waxy rigidity and tends to hold posture whatever position UE are placed.  Sensory: Reacts to noxious stimuli.  Deep Tendon Reflexes:  2+ throughout UE, KJ and AJ Plantars: downgoing bilaterally Cerebellar: Unable to assess Gait: not able to assess  Has improved since yesterday on my exam. Now with slightly increased motor activity to command, but still with significant waxy rigidity and catatonic features.  Lab Results: Basic Metabolic Panel:  Recent  Labs Lab 05/15/15 1113 05/16/15 0331 05/17/15 0413  NA 148* 146* 145  K 4.5 3.8 3.9  CL 108 112* 106  CO2 27 28 31   GLUCOSE 107* 115* 115*  BUN 34* 19 15  CREATININE 1.06 0.86 0.95  CALCIUM 9.8 8.7* 8.6*  MG  --  2.2  --   PHOS  --  3.2  --     Liver Function Tests:  Recent Labs Lab 05/15/15 1113  AST 184*  ALT 92*  ALKPHOS 65  BILITOT 1.5*  PROT 7.4  ALBUMIN 3.9   No results for input(s): LIPASE, AMYLASE in the last 168 hours. No results for input(s): AMMONIA in the last 168 hours.  CBC:  Recent Labs Lab 05/15/15 1113 05/15/15 1751 05/16/15 0331 05/17/15 0413  WBC 16.7* 13.6* 10.6* 10.4  NEUTROABS 14.1*  --   --   --   HGB 16.6 14.2 13.5 13.1  HCT 49.3 43.7 41.4 39.7  MCV 84.0 87.8 88.3 87.3  PLT 123* 86* 82* 66*    Cardiac Enzymes:  Recent Labs Lab 05/15/15 1113  CKTOTAL 7546*  TROPONINI 0.05*    Lipid Panel:  Recent Labs Lab 05/17/15 0413  TRIG 101    CBG:  Recent Labs Lab 05/15/15 1248  GLUCAP 87    Microbiology: Results for orders placed or performed during the hospital encounter of 05/15/15  MRSA PCR Screening     Status: None   Collection Time: 05/15/15  3:49 PM  Result Value Ref Range Status   MRSA by PCR NEGATIVE NEGATIVE Final    Comment:        The GeneXpert MRSA  Assay (FDA approved for NASAL specimens only), is one component of a comprehensive MRSA colonization surveillance program. It is not intended to diagnose MRSA infection nor to guide or monitor treatment for MRSA infections.   Culture, blood (routine x 2)     Status: None (Preliminary result)   Collection Time: 05/15/15  9:53 PM  Result Value Ref Range Status   Specimen Description BLOOD RIGHT ANTECUBITAL  Final   Special Requests BOTTLES DRAWN AEROBIC ONLY 5CC  Final   Culture NO GROWTH < 24 HOURS  Final   Report Status PENDING  Incomplete  Culture, blood (routine x 2)     Status: None (Preliminary result)   Collection Time: 05/15/15 10:06 PM   Result Value Ref Range Status   Specimen Description BLOOD RIGHT ANTECUBITAL  Final   Special Requests BOTTLES DRAWN AEROBIC ONLY 5CC  Final   Culture NO GROWTH < 24 HOURS  Final   Report Status PENDING  Incomplete    Coagulation Studies:  Recent Labs  05/15/15 1618  LABPROT 16.7*  INR 1.34    Imaging: Ct Head Wo Contrast  05/15/2015  CLINICAL DATA:  Patient comes from [employer]. Patient has been on suicide watch. They have been trying to wean him off Ativan. When EMS arrived patient was unresponsive. Pupils were non reactive. Seizures. EXAM: CT HEAD WITHOUT CONTRAST TECHNIQUE: Contiguous axial images were obtained from the base of the skull through the vertex without intravenous contrast. COMPARISON:  04/30/2015 MRI FINDINGS: There is no intra or extra-axial fluid collection or mass lesion. The basilar cisterns and ventricles have a normal appearance. There is no CT evidence for acute infarction or hemorrhage. Bone windows show mucoperiosteal thickening of the right maxillary sinus. No acute calvarial abnormality. IMPRESSION: No evidence for acute intracranial abnormality. Mild changes of chronic sinusitis. Electronically Signed   By: Norva Pavlov M.D.   On: 05/15/2015 14:28   Mr Laqueta Jean ZO Contrast  05/16/2015  CLINICAL DATA:  25 year old male with witnessed seizure in jail, unresponsive. Query meningitis. Initial encounter. EXAM: MRI HEAD WITHOUT AND WITH CONTRAST TECHNIQUE: Multiplanar, multiecho pulse sequences of the brain and surrounding structures were obtained without and with intravenous contrast. CONTRAST:  20mL MULTIHANCE GADOBENATE DIMEGLUMINE 529 MG/ML IV SOLN COMPARISON:  [facility] head CT without contrast 04/1915. Brain MRI without and with contrast 04/30/2015, and earlier. FINDINGS: Major intracranial vascular flow voids are stable. No restricted diffusion to suggest acute infarction. No midline shift, mass effect, evidence of mass lesion,  ventriculomegaly, extra-axial collection or acute intracranial hemorrhage. Cervicomedullary junction and pituitary are within normal limits. Wallace Cullens and white matter signal remains normal. Hippocampal formations appear stable and within normal limits. No abnormal enhancement identified. No dural thickening or leptomeningeal enhancement is identified. Stable enhancement of the major intracranial venous structures. Visible internal auditory structures appear normal. Mastoids are clear. Mild right maxillary sinus mucosal thickening is unchanged, otherwise paranasal sinuses are clear. Orbit and scalp soft tissues remain within normal limits. Visualized bone marrow signal is within normal limits. Negative visualized cervical spine. IMPRESSION: Stable and normal MRI appearance of the brain. CSF analysis would be necessary to exclude meningitis. Electronically Signed   By: Odessa Fleming M.D.   On: 05/16/2015 17:51   Ct Abdomen Pelvis W Contrast  05/15/2015  CLINICAL DATA:  Patient comes from [employer]. Patient has been on suicide watch. They have been trying to wean him off Ativan. When EMS arrived patient was unresponsive. Pupils were non reactive. Seizures.  EXAM: CT ABDOMEN AND PELVIS WITH CONTRAST TECHNIQUE: Multidetector CT imaging of the abdomen and pelvis was performed using the standard protocol following bolus administration of intravenous contrast. CONTRAST:  OMNIPAQUE IOHEXOL 300 MG/ML  SOLN COMPARISON:  None. FINDINGS: Lower chest: There patchy densities at the lung bases bilaterally, consistent atelectasis or early infiltrates. Heart size is normal. No imaged pericardial effusion or significant coronary artery calcifications. Upper abdomen: Artifact from the patient's arms at his sides. No focal abnormality identified within the liver, spleen, pancreas, or adrenal glands. Gallbladder is present. Kidneys show symmetric bilateral enhancement. No hydronephrosis or renal mass. Gastrointestinal tract:  Nasogastric tube in place to the stomach. Stomach has a normal appearance. Small bowel loops are normal in appearance. The appendix is well seen and has a normal appearance. Colonic loops are normal in appearance. Pelvis: Foley catheter decompresses the bladder. Air is identified within the urinary bladder. No free pelvic fluid. Retroperitoneum: No evidence for aortic aneurysm. No retroperitoneal or mesenteric adenopathy. Abdominal wall: Unremarkable. Osseous structures: Unremarkable. IMPRESSION: No evidence for acute  abnormality. Patchy densities at the lung bases bilaterally, left greater than right raising the question of early infiltrate versus atelectasis. Foley catheter.  Nasogastric tube. Electronically Signed   By: Norva Pavlov M.D.   On: 05/15/2015 14:34   Dg Chest Port 1 View  05/17/2015  CLINICAL DATA:  Acute respiratory failure EXAM: PORTABLE CHEST 1 VIEW COMPARISON:  05/16/2015 FINDINGS: Endotracheal tube and NG tube are unchanged. There are bibasilar opacities, likely atelectasis. Suspect small left pleural effusion. Heart is borderline in size. No acute bony abnormality. IMPRESSION: Bibasilar opacities, likely atelectasis.  Small left effusion. Electronically Signed   By: Charlett Nose M.D.   On: 05/17/2015 07:40   Dg Chest Port 1 View  05/16/2015  CLINICAL DATA:  25 year old male with respiratory failure. Subsequent encounter. EXAM: PORTABLE CHEST 1 VIEW COMPARISON:  05/15/2015. FINDINGS: Endotracheal tube tip 3 cm above the carina. Nasogastric tube courses below the diaphragm. Tip is not included on the present exam. Consolidation left base may represent atelectasis, infiltrate and/ or pleural effusion. Follow-up until clearance recommended to exclude underlying abnormality. Mild central pulmonary vascular prominence. No gross pneumothorax. Heart size top-normal. IMPRESSION: Consolidation left base may represent atelectasis. Infiltrate and/or pleural effusions secondary considerations.  Electronically Signed   By: Lacy Duverney M.D.   On: 05/16/2015 07:33   Dg Chest Port 1 View  05/15/2015  CLINICAL DATA:  Acute respiratory failure. Unresponsive. Intubated. EXAM: PORTABLE CHEST 1 VIEW COMPARISON:  None. FINDINGS: 1920 hours. Endotracheal tube tip terminates 2.2 cm above the carina. Nasogastric tube projects below the diaphragm, tip at the level of the distal stomach. There are low lung volumes with left-greater-than-right basilar airspace opacities. No pneumothorax or significant pleural effusion identified. There is no evidence of rib fracture. IMPRESSION: 1. The endotracheal and nasogastric tubes are satisfactorily positioned. 2. Left-greater-than-right basilar airspace opacities suspicious for possible aspiration. Electronically Signed   By: Carey Bullocks M.D.   On: 05/15/2015 19:38   Dg Chest Portable 1 View  05/15/2015  CLINICAL DATA:  Unresponsive EXAM: PORTABLE CHEST 1 VIEW COMPARISON:  05/15/2015 1200 hours FINDINGS: Endotracheal tube placed. Tip is 2.5 cm from the carina. Low volumes. Bibasilar atelectasis. No pneumothorax. Normal heart size. IMPRESSION: Endotracheal tube placed.  Bibasilar atelectasis persists Electronically Signed   By: Jolaine Click M.D.   On: 05/15/2015 13:37   Dg Chest Portable 1 View  05/15/2015  CLINICAL DATA:  Per EMS, Patient comes from [facility]  jail where they have been trying to ween him off Ativan. When EMS arrived patient was unresponsive. Pupils were non reactive. Patient was seizing, EMS is not sure how long he had been seizing prior to arrival. EXAM: PORTABLE CHEST 1 VIEW COMPARISON:  07/23/2014 FINDINGS: Heart, mediastinum hila are unremarkable. Clear lungs. No pleural effusion or pneumothorax. Skeletal structures are unremarkable. IMPRESSION: No active disease. Electronically Signed   By: Amie Portland M.D.   On: 05/15/2015 12:45    Medications: Medication list reviewed.   Assessment/Plan:  1. Encephalopathy with waxy rigidity. Has  improved since yesterday per nursing as well as on my exam. Now with slightly increased motor activity to command, but still with significant waxy rigidity and catatonic features on exam. DDx unchanged, including neuroleptic malignant syndrome, toxic encephalopathy, catatonia secondary to surreptitious/undocumented neuroleptic use or withdrawal from undocumented or surreptitious use of a dopaminergic compound. Overall presentation would be atypical for benzodiazepine withdrawal.  2. Official report for the MRI study obtained yesterday afternoon: Stable and normal MRI appearance of the brain.  3. LP by VIR today.   4. Repeat EEG today. Prior EEG demonstrated no focal, hemispheric, or lateralizing features; during the portion of the recording in which the patient was most awake, there was a mild diffuse slowing of electrocerebral activity, which can be seen in a wide variety of encephalopathic states including those of a toxic, metabolic, or degenerative nature.   Recommendations: 1. Continue Keppra. 2. Continue ceftriaxone.  3. Continue Klonopin.  4. CIWA protocol.  5. Agree with obtaining LP today.  6. DVT prophylaxis with SCDs.  7. 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 Dosing is 2.5 mg through nasogastric tube q6-8h and titrated up to a maximum dose of 40 mg/day. This can be continued for 10 days after NMS is controlled and then tapered slowly.    Caryl Pina, MD 05/17/2015, 11:52 AM

## 2015-05-17 NOTE — Consult Note (Signed)
Date of Admission:  05/15/2015  Date of Consult:  05/17/2015  Reason for Consult: Coagulase-negative staphylococcal bacteremia and confusion Referring Physician: Dr. Lamonte Sakai   HPI: Chase Hampton is an 25 y.o. male. PMH of GERD, anxiety and depression (prior [facility] hospitalizations) with recent admission to [facility] in [place] from 2/3-2/8 for major depressive disorder with homicidal / suicidal ideations after a recent break up with his fiancee. Due to restraining order violations, he was transferred from [facility] to [facility] jail were he has been on suicide watch since arrival. The patient reportedly refused to eat, drink or take medications. He was reportedly seen lying in the floor and would urinate on himself. After multiple episodes of urinating on himself, he as asked to mop up the floor which he was able to perform. Prior to discharge from [facility], notes reflect he had multiple episodes of physically aggressive behavior - including striking his own head on the seclusion room walls. At discharge he was to continue on Cymbalta, Depakote and Saphris. Prison staff report the patient refused all medications after arrival.   On 2/19, the patient was noted to have a seizure in jail. He was transported to [facility] ER via EMS. It is documented that he arrived to the ER with urine and feces all over him. There is question if patient was weaning off ativan?? Per [facility] notes but discharge note from [facility] does not include Rx for ativan. Room air saturations were 82%, he was treated with 100% NRB. He was treated with 85m IV versed per EMS in route. The patient had a second seizure in ER and was ultimately intubated. [facility].   In the interim his blood cultures from admission were + with BIOFIRE ID of Coag negative staph which is growing on cultures, sensis are still pending.  He had an MRI of the brain performed just today with contrast which was negative for  any acute findings there is no mention of leptomeningeal enhancement. It was stable in comparison to MRI of from February 4 when he had been striking his head against the wall.  He is scheduled for lumbar puncture today.  It is not known whether the patient has any history of intravenous drug use. He was sedated and on the ventilator when I saw him and I could not obtain further history with regards to substance abuse.   Past Medical History  Diagnosis Date  . GERD (gastroesophageal reflux disease)   . Anxiety   . Depression     Past Surgical History  Procedure Laterality Date  . No past surgeries      Social History:  reports that he has quit smoking. His smoking use included Cigars. He has never used smokeless tobacco. He reports that he uses illicit drugs (Marijuana). He reports that he does not drink alcohol. I could not query him directly at time of my exam   Family History  Problem Relation Age of Onset  . Heart disease Maternal Grandfather   . Diabetes Maternal Grandfather   . Heart disease Paternal Grandmother     Allergies  Allergen Reactions  . Haldol [Haloperidol Lactate] Other (See Comments)    [facility] no allergies as of 05/15/15)     Medications: I have reviewed patients current medications as documented in Epic Anti-infectives    Start     Dose/Rate Route Frequency Ordered Stop   05/17/15 1600  vancomycin (VANCOCIN)  1,500 mg in sodium chloride 0.9 % 500 mL IVPB     1,500 mg 250 mL/hr over 120 Minutes Intravenous Every 8 hours 05/17/15 1141     05/16/15 0600  cefTRIAXone (ROCEPHIN) 2 g in dextrose 5 % 50 mL IVPB     2 g 100 mL/hr over 30 Minutes Intravenous Every 12 hours 05/15/15 1653     05/16/15 0200  vancomycin (VANCOCIN) IVPB 1000 mg/200 mL premix  Status:  Discontinued     1,000 mg 200 mL/hr over 60 Minutes Intravenous Every 8 hours 05/15/15 1713 05/17/15 1141   05/15/15 1800  vancomycin (VANCOCIN) 2,500 mg in  sodium chloride 0.9 % 500 mL IVPB  Status:  Discontinued     2,500 mg 250 mL/hr over 120 Minutes Intravenous  Once 05/15/15 1713 05/15/15 1729   05/15/15 1800  vancomycin (VANCOCIN) IVPB 1000 mg/200 mL premix     1,000 mg 200 mL/hr over 60 Minutes Intravenous  Once 05/15/15 1729 05/15/15 1900   05/15/15 1715  cefTRIAXone (ROCEPHIN) 2 g in dextrose 5 % 50 mL IVPB     2 g 100 mL/hr over 30 Minutes Intravenous  Once 05/15/15 1652 05/15/15 1749         ROS:as in HPI otherwise remainder of 12 point Review of Systems is not obtainable due to patient's confusion   Blood pressure 117/66, pulse 102, temperature 99.1 F (37.3 C), temperature source Axillary, resp. rate 17, height '5\' 11"'  (1.803 m), weight 267 lb 13.7 oz (121.5 kg), SpO2 100 %. General: Intubated sedated on ventilator and restrained with restraints and also handcuffs on his ankles. HEENT: anicteric sclera,  EOMI, ET tube in place Cardiovascular: Tachycardic, normal r,  no murmur rubs or gallops Pulmonary: Fairly clear to auscultation bilaterally, no wheezing, rales or rhonchi Gastrointestinal: soft nontender, nondistended, normal bowel sounds, Musculoskeletal: no  clubbing or edema noted bilaterally Skin, soft tissue: no rashes Neuro: nonfocal   Results for orders placed or performed during the hospital encounter of 05/15/15 (from the past 48 hour(s))  MRSA PCR Screening     Status: None   Collection Time: 05/15/15  3:49 PM  Result Value Ref Range   MRSA by PCR NEGATIVE NEGATIVE    Comment:        The GeneXpert MRSA Assay (FDA approved for NASAL specimens only), is one component of a comprehensive MRSA colonization surveillance program. It is not intended to diagnose MRSA infection nor to guide or monitor treatment for MRSA infections.   APTT     Status: None   Collection Time: 05/15/15  4:18 PM  Result Value Ref Range   aPTT 34 24 - 37 seconds  Protime-INR     Status: Abnormal   Collection Time: 05/15/15  4:18  PM  Result Value Ref Range   Prothrombin Time 16.7 (H) 11.6 - 15.2 seconds   INR 1.34 0.00 - 1.49  CBC     Status: Abnormal   Collection Time: 05/15/15  5:51 PM  Result Value Ref Range   WBC 13.6 (H) 4.0 - 10.5 K/uL   RBC 4.98 4.22 - 5.81 MIL/uL   Hemoglobin 14.2 13.0 - 17.0 g/dL   HCT 43.7 39.0 - 52.0 %   MCV 87.8 78.0 - 100.0 fL   MCH 28.5 26.0 - 34.0 pg   MCHC 32.5 30.0 - 36.0 g/dL   RDW 14.3 11.5 - 15.5 %   Platelets 86 (L) 150 - 400 K/uL    Comment: REPEATED TO VERIFY SPECIMEN CHECKED FOR  CLOTS PLATELET COUNT CONFIRMED BY SMEAR   Procalcitonin     Status: None   Collection Time: 05/15/15  5:51 PM  Result Value Ref Range   Procalcitonin <0.10 ng/mL    Comment:        Interpretation: PCT (Procalcitonin) <= 0.5 ng/mL: Systemic infection (sepsis) is not likely. Local bacterial infection is possible. (NOTE)         ICU PCT Algorithm               Non ICU PCT Algorithm    ----------------------------     ------------------------------         PCT < 0.25 ng/mL                 PCT < 0.1 ng/mL     Stopping of antibiotics            Stopping of antibiotics       strongly encouraged.               strongly encouraged.    ----------------------------     ------------------------------       PCT level decrease by               PCT < 0.25 ng/mL       >= 80% from peak PCT       OR PCT 0.25 - 0.5 ng/mL          Stopping of antibiotics                                             encouraged.     Stopping of antibiotics           encouraged.    ----------------------------     ------------------------------       PCT level decrease by              PCT >= 0.25 ng/mL       < 80% from peak PCT        AND PCT >= 0.5 ng/mL            Continuin g antibiotics                                              encouraged.       Continuing antibiotics            encouraged.    ----------------------------     ------------------------------     PCT level increase compared          PCT > 0.5  ng/mL         with peak PCT AND          PCT >= 0.5 ng/mL             Escalation of antibiotics                                          strongly encouraged.      Escalation of antibiotics        strongly encouraged.   HIV antibody     Status: None   Collection Time: 05/15/15  9:53 PM  Result Value Ref Range   HIV Screen 4th Generation wRfx Non Reactive Non Reactive    Comment: (NOTE) Performed At: [facility] [place], [place] [id number] Lindon Romp MD YT:[id number]   Culture, blood (routine x 2)     Status: None (Preliminary result)   Collection Time: 05/15/15  9:53 PM  Result Value Ref Range   Specimen Description BLOOD RIGHT ANTECUBITAL    Special Requests BOTTLES DRAWN AEROBIC ONLY 5CC    Culture NO GROWTH < 24 HOURS    Report Status PENDING   Culture, blood (routine x 2)     Status: None (Preliminary result)   Collection Time: 05/15/15 10:06 PM  Result Value Ref Range   Specimen Description BLOOD RIGHT ANTECUBITAL    Special Requests BOTTLES DRAWN AEROBIC ONLY 5CC    Culture NO GROWTH < 24 HOURS    Report Status PENDING   CBC     Status: Abnormal   Collection Time: 05/16/15  3:31 AM  Result Value Ref Range   WBC 10.6 (H) 4.0 - 10.5 K/uL   RBC 4.69 4.22 - 5.81 MIL/uL   Hemoglobin 13.5 13.0 - 17.0 g/dL   HCT 41.4 39.0 - 52.0 %   MCV 88.3 78.0 - 100.0 fL   MCH 28.8 26.0 - 34.0 pg   MCHC 32.6 30.0 - 36.0 g/dL   RDW 14.4 11.5 - 15.5 %   Platelets 82 (L) 150 - 400 K/uL    Comment: CONSISTENT WITH PREVIOUS RESULT  Basic metabolic panel     Status: Abnormal   Collection Time: 05/16/15  3:31 AM  Result Value Ref Range   Sodium 146 (H) 135 - 145 mmol/L   Potassium 3.8 3.5 - 5.1 mmol/L   Chloride 112 (H) 101 - 111 mmol/L   CO2 28 22 - 32 mmol/L   Glucose, Bld 115 (H) 65 - 99 mg/dL   BUN 19 6 - 20 mg/dL   Creatinine, Ser 0.86 0.61 - 1.24 mg/dL   Calcium 8.7 (L) 8.9 - 10.3 mg/dL   GFR calc non Af Amer >60 >60 mL/min   GFR calc Af Amer >60  >60 mL/min    Comment: (NOTE) The eGFR has been calculated using the CKD EPI equation. This calculation has not been validated in all clinical situations. eGFR's persistently <60 mL/min signify possible Chronic Kidney Disease.    Anion gap 6 5 - 15  Magnesium     Status: None   Collection Time: 05/16/15  3:31 AM  Result Value Ref Range   Magnesium 2.2 1.7 - 2.4 mg/dL  Phosphorus     Status: None   Collection Time: 05/16/15  3:31 AM  Result Value Ref Range   Phosphorus 3.2 2.5 - 4.6 mg/dL  Basic metabolic panel     Status: Abnormal   Collection Time: 05/17/15  4:13 AM  Result Value Ref Range   Sodium 145 135 - 145 mmol/L   Potassium 3.9 3.5 - 5.1 mmol/L   Chloride 106 101 - 111 mmol/L   CO2 31 22 - 32 mmol/L   Glucose, Bld 115 (H) 65 - 99 mg/dL   BUN 15 6 - 20 mg/dL   Creatinine, Ser 0.95 0.61 - 1.24 mg/dL   Calcium 8.6 (L) 8.9 - 10.3 mg/dL   GFR calc non Af Amer >60 >60 mL/min   GFR calc Af Amer >60 >60 mL/min    Comment: (NOTE) The eGFR has been calculated using the CKD EPI equation. This calculation  has not been validated in all clinical situations. eGFR's persistently <60 mL/min signify possible Chronic Kidney Disease.    Anion gap 8 5 - 15  CBC     Status: Abnormal   Collection Time: 05/17/15  4:13 AM  Result Value Ref Range   WBC 10.4 4.0 - 10.5 K/uL   RBC 4.55 4.22 - 5.81 MIL/uL   Hemoglobin 13.1 13.0 - 17.0 g/dL   HCT 39.7 39.0 - 52.0 %   MCV 87.3 78.0 - 100.0 fL   MCH 28.8 26.0 - 34.0 pg   MCHC 33.0 30.0 - 36.0 g/dL   RDW 14.1 11.5 - 15.5 %   Platelets 66 (L) 150 - 400 K/uL    Comment: CONSISTENT WITH PREVIOUS RESULT  Valproic acid level     Status: None   Collection Time: 05/17/15  4:13 AM  Result Value Ref Range   Valproic Acid Lvl 52 50.0 - 100.0 ug/mL  Triglycerides     Status: None   Collection Time: 05/17/15  4:13 AM  Result Value Ref Range   Triglycerides 101 <150 mg/dL  Vancomycin, trough     Status: Abnormal   Collection Time: 05/17/15  9:36  AM  Result Value Ref Range   Vancomycin Tr 7 (L) 10.0 - 20.0 ug/mL   '@BRIEFLABTABLE' (sdes,specrequest,cult,reptstatus)   ) Recent Results (from the past 720 hour(s))  Culture, blood (Routine X 2) w Reflex to ID Panel     Status: None (Preliminary result)   Collection Time: 05/15/15 11:13 AM  Result Value Ref Range Status   Specimen Description BLOOD LEFT ANTECUBITAL  Final   Special Requests BOTTLES DRAWN AEROBIC AND ANAEROBIC  1CC  Final   Culture  Setup Time   Final    GRAM POSITIVE COCCI IN CLUSTERS AEROBIC BOTTLE ONLY CRITICAL VALUE NOTED.  VALUE IS CONSISTENT WITH PREVIOUSLY REPORTED AND CALLED VALUE.    Culture   Final    COAGULASE NEGATIVE STAPHYLOCOCCUS AEROBIC BOTTLE ONLY IDENTIFICATION AND SUSCEPTIBILITIES TO FOLLOW    Report Status PENDING  Incomplete  Culture, blood (Routine X 2) w Reflex to ID Panel     Status: None (Preliminary result)   Collection Time: 05/15/15 11:13 AM  Result Value Ref Range Status   Specimen Description BLOOD LEFT WRIST  Final   Special Requests BOTTLES DRAWN AEROBIC AND ANAEROBIC  1CC  Final   Culture  Setup Time   Final    GRAM POSITIVE COCCI IN CLUSTERS ANAEROBIC BOTTLE ONLY CRITICAL RESULT CALLED TO, READ BACK BY AND VERIFIED WITH: KAREN HAYES 05/16/2015 1230 BY JRS    Culture   Final    COAGULASE NEGATIVE STAPHYLOCOCCUS ANAEROBIC BOTTLE ONLY IDENTIFICATION AND SUSCEPTIBILITIES TO FOLLOW    Report Status PENDING  Incomplete  Blood Culture ID Panel (Reflexed)     Status: Abnormal   Collection Time: 05/15/15 11:13 AM  Result Value Ref Range Status   Enterococcus species NOT DETECTED NOT DETECTED Final   Listeria monocytogenes NOT DETECTED NOT DETECTED Final   Staphylococcus species DETECTED (A) NOT DETECTED Final    Comment: CRITICAL RESULT CALLED TO, READ BACK BY AND VERIFIED WITH: KAREN HAYES 05/16/2015 1230 BY JRS.    Staphylococcus aureus NOT DETECTED NOT DETECTED Final   Streptococcus species NOT DETECTED NOT DETECTED Final    Streptococcus agalactiae NOT DETECTED NOT DETECTED Final   Streptococcus pneumoniae NOT DETECTED NOT DETECTED Final   Streptococcus pyogenes NOT DETECTED NOT DETECTED Final   Acinetobacter baumannii NOT DETECTED NOT DETECTED Final   Enterobacteriaceae  species NOT DETECTED NOT DETECTED Final   Enterobacter cloacae complex NOT DETECTED NOT DETECTED Final   Escherichia coli NOT DETECTED NOT DETECTED Final   Klebsiella oxytoca NOT DETECTED NOT DETECTED Final   Klebsiella pneumoniae NOT DETECTED NOT DETECTED Final   Proteus species NOT DETECTED NOT DETECTED Final   Serratia marcescens NOT DETECTED NOT DETECTED Final   Haemophilus influenzae NOT DETECTED NOT DETECTED Final   Neisseria meningitidis NOT DETECTED NOT DETECTED Final   Pseudomonas aeruginosa NOT DETECTED NOT DETECTED Final   Candida albicans NOT DETECTED NOT DETECTED Final   Candida glabrata NOT DETECTED NOT DETECTED Final   Candida krusei NOT DETECTED NOT DETECTED Final   Candida parapsilosis NOT DETECTED NOT DETECTED Final   Candida tropicalis NOT DETECTED NOT DETECTED Final   Carbapenem resistance NOT DETECTED NOT DETECTED Final   Methicillin resistance NOT DETECTED NOT DETECTED Final   Vancomycin resistance NOT DETECTED NOT DETECTED Final  MRSA PCR Screening     Status: None   Collection Time: 05/15/15  3:49 PM  Result Value Ref Range Status   MRSA by PCR NEGATIVE NEGATIVE Final    Comment:        The GeneXpert MRSA Assay (FDA approved for NASAL specimens only), is one component of a comprehensive MRSA colonization surveillance program. It is not intended to diagnose MRSA infection nor to guide or monitor treatment for MRSA infections.   Culture, blood (routine x 2)     Status: None (Preliminary result)   Collection Time: 05/15/15  9:53 PM  Result Value Ref Range Status   Specimen Description BLOOD RIGHT ANTECUBITAL  Final   Special Requests BOTTLES DRAWN AEROBIC ONLY 5CC  Final   Culture NO GROWTH < 24 HOURS   Final   Report Status PENDING  Incomplete  Culture, blood (routine x 2)     Status: None (Preliminary result)   Collection Time: 05/15/15 10:06 PM  Result Value Ref Range Status   Specimen Description BLOOD RIGHT ANTECUBITAL  Final   Special Requests BOTTLES DRAWN AEROBIC ONLY 5CC  Final   Culture NO GROWTH < 24 HOURS  Final   Report Status PENDING  Incomplete     Impression/Recommendation  Active Problems:   Status epilepticus ([place])   Seizures (HCC)   HANIF RADIN is a 25 y.o. male with  Depression, ? Bipolar disorder recent homicidal ideation, suicidal ideation who had several health inflicted head injuries while in police custody in jail, was refusing to take his medications including Depakote while in jail and then was found to have had a seizure brought to the hospital and ultimately intubated and transferred to [facility] cone. His admission blood cultures were positive for coagulase-negative Staphylococcus.  #1 Coagulase-negative Staphylococcus bacteremia:  --I have called Trish from cardiology to request a transesophageal echocardiogram since the patient is RE intubated and we will get best pictures with a TEE.  --Repeat blood culture should be obtained --fine to continue IV vancomycin and IV rocephin pending S of organism from blood --LP is reasonable as well  #2 Screening: Ensure screening for HIV and hep C have been performed       05/17/2015, 12:01 PM   Thank you so much for this interesting consult  [place] [facility] [place] [phone] (pager) [phone] (office) 05/17/2015, 12:01 PM  [place] 05/17/2015, 12:01 PM

## 2015-05-18 DIAGNOSIS — G9340 Encephalopathy, unspecified: Secondary | ICD-10-CM

## 2015-05-18 DIAGNOSIS — Z9889 Other specified postprocedural states: Secondary | ICD-10-CM

## 2015-05-18 DIAGNOSIS — R45850 Homicidal ideations: Secondary | ICD-10-CM

## 2015-05-18 DIAGNOSIS — R7881 Bacteremia: Secondary | ICD-10-CM

## 2015-05-18 DIAGNOSIS — Z4659 Encounter for fitting and adjustment of other gastrointestinal appliance and device: Secondary | ICD-10-CM

## 2015-05-18 DIAGNOSIS — J9601 Acute respiratory failure with hypoxia: Secondary | ICD-10-CM

## 2015-05-18 DIAGNOSIS — J9602 Acute respiratory failure with hypercapnia: Secondary | ICD-10-CM

## 2015-05-18 LAB — COMPREHENSIVE METABOLIC PANEL
ALT: 58 U/L (ref 17–63)
ANION GAP: 10 (ref 5–15)
AST: 59 U/L — ABNORMAL HIGH (ref 15–41)
Albumin: 2.2 g/dL — ABNORMAL LOW (ref 3.5–5.0)
Alkaline Phosphatase: 45 U/L (ref 38–126)
BUN: 19 mg/dL (ref 6–20)
CALCIUM: 8.5 mg/dL — AB (ref 8.9–10.3)
CHLORIDE: 106 mmol/L (ref 101–111)
CO2: 27 mmol/L (ref 22–32)
CREATININE: 0.76 mg/dL (ref 0.61–1.24)
Glucose, Bld: 117 mg/dL — ABNORMAL HIGH (ref 65–99)
Potassium: 3.9 mmol/L (ref 3.5–5.1)
SODIUM: 143 mmol/L (ref 135–145)
Total Bilirubin: 0.8 mg/dL (ref 0.3–1.2)
Total Protein: 5 g/dL — ABNORMAL LOW (ref 6.5–8.1)

## 2015-05-18 LAB — HERPES SIMPLEX VIRUS(HSV) DNA BY PCR
HSV 1 DNA: NEGATIVE
HSV 2 DNA: NEGATIVE

## 2015-05-18 LAB — GLUCOSE, CAPILLARY
GLUCOSE-CAPILLARY: 114 mg/dL — AB (ref 65–99)
GLUCOSE-CAPILLARY: 97 mg/dL (ref 65–99)
Glucose-Capillary: 114 mg/dL — ABNORMAL HIGH (ref 65–99)
Glucose-Capillary: 103 mg/dL — ABNORMAL HIGH (ref 65–99)
Glucose-Capillary: 96 mg/dL (ref 65–99)

## 2015-05-18 LAB — CBC
HCT: 38.1 % — ABNORMAL LOW (ref 39.0–52.0)
HEMOGLOBIN: 12.4 g/dL — AB (ref 13.0–17.0)
MCH: 28.3 pg (ref 26.0–34.0)
MCHC: 32.5 g/dL (ref 30.0–36.0)
MCV: 87 fL (ref 78.0–100.0)
PLATELETS: 75 10*3/uL — AB (ref 150–400)
RBC: 4.38 MIL/uL (ref 4.22–5.81)
RDW: 13.9 % (ref 11.5–15.5)
WBC: 8.8 10*3/uL (ref 4.0–10.5)

## 2015-05-18 LAB — SEDIMENTATION RATE: Sed Rate: 25 mm/hr — ABNORMAL HIGH (ref 0–16)

## 2015-05-18 LAB — VDRL, CSF: SYPHILIS VDRL QUANT CSF: NONREACTIVE

## 2015-05-18 LAB — CK: CK TOTAL: 1235 U/L — AB (ref 49–397)

## 2015-05-18 MED ORDER — NEOMYCIN-POLYMYXIN-PRAMOXINE 1 % EX CREA
TOPICAL_CREAM | Freq: Two times a day (BID) | CUTANEOUS | Status: DC
  Filled 2015-05-18: qty 28

## 2015-05-18 MED ORDER — CEFAZOLIN SODIUM-DEXTROSE 2-3 GM-% IV SOLR
2.0000 g | Freq: Three times a day (TID) | INTRAVENOUS | Status: DC
Start: 2015-05-18 — End: 2015-05-20
  Administered 2015-05-18 – 2015-05-20 (×6): 2 g via INTRAVENOUS
  Filled 2015-05-18 (×8): qty 50

## 2015-05-18 MED ORDER — BACITRACIN-NEOMYCIN-POLYMYXIN OINTMENT TUBE
TOPICAL_OINTMENT | Freq: Two times a day (BID) | CUTANEOUS | Status: DC
  Administered 2015-05-18 – 2015-05-23 (×11): via TOPICAL
  Administered 2015-05-24: 1 via TOPICAL
  Administered 2015-05-24: 22:00:00 via TOPICAL
  Administered 2015-05-25: 1 via TOPICAL
  Administered 2015-05-25 – 2015-06-04 (×6): via TOPICAL
  Administered 2015-06-05: 1 via TOPICAL
  Administered 2015-06-05 – 2015-06-07 (×4): via TOPICAL
  Administered 2015-06-07: 1 via TOPICAL
  Administered 2015-06-08 – 2015-06-12 (×7): via TOPICAL
  Administered 2015-06-13: 1 via TOPICAL
  Administered 2015-06-14: 10:00:00 via TOPICAL
  Filled 2015-05-18 (×4): qty 15

## 2015-05-18 MED ORDER — CLONAZEPAM 1 MG PO TABS
1.0000 mg | ORAL_TABLET | Freq: Every day | ORAL | Status: DC
  Administered 2015-05-18 – 2015-05-26 (×9): 1 mg via ORAL
  Filled 2015-05-18 (×9): qty 1

## 2015-05-18 NOTE — Progress Notes (Addendum)
Subjective: Continues to slowly improve. Now more awake per nursing.  Objective: Current vital signs: BP 118/76 mmHg  Pulse 112  Temp(Src) 100.9 F (38.3 C) (Axillary)  Resp 20  Ht  (1.803 m)  Wt 121.7 kg (268 lb 4.8 oz)  BMI 37.44 kg/m2  SpO2 97% Vital signs in last 24 hours: Temp:  [99.1 F (37.3 C)-101.3 F (38.5 C)] 100.9 F (38.3 C) (02/22 0800) Pulse Rate:  [87-117] 112 (02/22 0800) Resp:  [14-20] 20 (02/22 0800) BP: (89-130)/(55-110) 118/76 mmHg (02/22 0700) SpO2:  [96 %-100 %] 97 % (02/22 0800) FiO2 (%):  [40 %] 40 % (02/22 0316) Weight:  [121.7 kg (268 lb 4.8 oz)] 121.7 kg (268 lb 4.8 oz) (02/22 0500)  Intake/Output from previous day: 02/21 0701 - 02/22 0700 In: 2256.7 [I.V.:396.7; NG/GT:230; IV Piggyback:1630] Out: 820 [Urine:820] Intake/Output this shift: Total I/O In: 20 [I.V.:20] Out: 170 [Urine:170] Nutritional status: Diet NPO time specified  Neurologic Exam: Ment: Able to nod and shake head weakly in response to yes/no questions. Opens eyes partially to command. Mouths "five" when asked to count five fingers. CN: PERRL, fixates on visual stimulus briefly, able to count fingers, face symmetric. Intubated. Motor: Rigidity has improved slightly since yesterday.  Reflexes: Hypoactive in the context of rigidity.   Shortly after completion of neuro exam, patient stopped responding to commands with continuous rhythmic jerking of jaw noted. Exam revealed dilated, reactive pupils, elevated HR and respirations, as well as no response to verbal or noxious stimuli. Findings most consistent with seizure, which broke after 2 minutes following administration of Ativan 2 mg IV.   Lab Results: Basic Metabolic Panel:  Recent Labs Lab 05/15/15 1113 05/16/15 0331 05/17/15 0413 05/18/15 0420  NA 148* 146* 145 143  K 4.5 3.8 3.9 3.9  CL 108 112* 106 106  CO2 GLUCOSE 107* 115* 115* 117*  BUN 34* CREATININE 1.06 0.86 0.95 0.76  CALCIUM  9.8 8.7* 8.6* 8.5*  MG  --  2.2  --   --   PHOS  --  3.2  --   --     Liver Function Tests:  Recent Labs Lab 05/15/15 1113 05/18/15 0420  AST 184* 59*  ALT 92* 58  ALKPHOS 65 45  BILITOT 1.5* 0.8  PROT 7.4 5.0*  ALBUMIN 3.9 2.2*   No results for input(s): LIPASE, AMYLASE in the last 168 hours. No results for input(s): AMMONIA in the last 168 hours.  CBC:  Recent Labs Lab 05/15/15 1113 05/15/15 1751 05/16/15 0331 05/17/15 0413 05/18/15 0420  WBC 16.7* 13.6* 10.6* 10.4 8.8  NEUTROABS 14.1*  --   --   --   --   HGB 16.6 14.2 13.5 13.1 12.4*  HCT 49.3 43.7 41.4 39.7 38.1*  MCV 84.0 87.8 88.3 87.3 87.0  PLT 123* 86* 82* 66* 75*    Cardiac Enzymes:  Recent Labs Lab 05/15/15 1113 05/18/15 0420  CKTOTAL 7546* 1235*  TROPONINI 0.05*  --     Lipid Panel:  Recent Labs Lab 05/17/15 0413  TRIG 101    CBG:  Recent Labs Lab 05/15/15 1248 05/17/15 2328 05/18/15 0328  GLUCAP 87 101* 114*    Microbiology: Results for orders placed or performed during the hospital encounter of 05/15/15  MRSA PCR Screening     Status: None   Collection Time: 05/15/15  3:49 PM  Result Value Ref Range Status   MRSA by PCR NEGATIVE NEGATIVE Final  Comment:        The GeneXpert MRSA Assay (FDA approved for NASAL specimens only), is one component of a comprehensive MRSA colonization surveillance program. It is not intended to diagnose MRSA infection nor to guide or monitor treatment for MRSA infections.   Culture, blood (routine x 2)     Status: None (Preliminary result)   Collection Time: 05/15/15  9:53 PM  Result Value Ref Range Status   Specimen Description BLOOD RIGHT ANTECUBITAL  Final   Special Requests BOTTLES DRAWN AEROBIC ONLY 5CC  Final   Culture NO GROWTH 2 DAYS  Final   Report Status PENDING  Incomplete  Culture, blood (routine x 2)     Status: None (Preliminary result)   Collection Time: 05/15/15 10:06 PM  Result Value Ref Range Status   Specimen  Description BLOOD RIGHT ANTECUBITAL  Final   Special Requests BOTTLES DRAWN AEROBIC ONLY 5CC  Final   Culture NO GROWTH 2 DAYS  Final   Report Status PENDING  Incomplete  CSF culture     Status: None (Preliminary result)   Collection Time: 05/17/15  3:01 PM  Result Value Ref Range Status   Specimen Description CSF  Final   Special Requests NONE  Final   Gram Stain   Final    CYTOSPIN SMEAR WBC PRESENT, PREDOMINANTLY MONONUCLEAR NO ORGANISMS SEEN    Culture PENDING  Incomplete   Report Status PENDING  Incomplete    Coagulation Studies:  Recent Labs  05/15/15 1618  LABPROT 16.7*  INR 1.34    Imaging: Mr Lodema Pilot Contrast  05/16/2015  CLINICAL DATA:  25 year old male with witnessed seizure in jail, unresponsive. Query meningitis. Initial encounter. EXAM: MRI HEAD WITHOUT AND WITH CONTRAST TECHNIQUE: Multiplanar, multiecho pulse sequences of the brain and surrounding structures were obtained without and with intravenous contrast. CONTRAST:  20mL MULTIHANCE GADOBENATE DIMEGLUMINE 529 MG/ML IV SOLN COMPARISON:  [facility] head CT without contrast 04/1915. Brain MRI without and with contrast 04/30/2015, and earlier. FINDINGS: Major intracranial vascular flow voids are stable. No restricted diffusion to suggest acute infarction. No midline shift, mass effect, evidence of mass lesion, ventriculomegaly, extra-axial collection or acute intracranial hemorrhage. Cervicomedullary junction and pituitary are within normal limits. Wallace Cullens and white matter signal remains normal. Hippocampal formations appear stable and within normal limits. No abnormal enhancement identified. No dural thickening or leptomeningeal enhancement is identified. Stable enhancement of the major intracranial venous structures. Visible internal auditory structures appear normal. Mastoids are clear. Mild right maxillary sinus mucosal thickening is unchanged, otherwise paranasal sinuses are clear. Orbit and  scalp soft tissues remain within normal limits. Visualized bone marrow signal is within normal limits. Negative visualized cervical spine. IMPRESSION: Stable and normal MRI appearance of the brain. CSF analysis would be necessary to exclude meningitis. Electronically Signed   By: Odessa Fleming M.D.   On: 05/16/2015 17:51   Dg Chest Port 1 View  05/17/2015  CLINICAL DATA:  Acute respiratory failure EXAM: PORTABLE CHEST 1 VIEW COMPARISON:  05/16/2015 FINDINGS: Endotracheal tube and NG tube are unchanged. There are bibasilar opacities, likely atelectasis. Suspect small left pleural effusion. Heart is borderline in size. No acute bony abnormality. IMPRESSION: Bibasilar opacities, likely atelectasis.  Small left effusion. Electronically Signed   By: Charlett Nose M.D.   On: 05/17/2015 07:40   Dg Abd Portable 1v  05/17/2015  CLINICAL DATA:  Orogastric tube placement EXAM: PORTABLE ABDOMEN - 1 VIEW COMPARISON:  Abdominal CT 05/15/2015 FINDINGS: Orogastric tube tip at  pyloric region. Normal bowel gas pattern. Atelectatic type opacities at the left base, as seen on CT 05/15/2015 IMPRESSION: Orogastric tube tip at the pylorus. Electronically Signed   By: Marnee Spring M.D.   On: 05/17/2015 17:06   Dg Fluoro Guide Lumbar Puncture  05/17/2015  CLINICAL DATA:  Altered mental status, concern for meningitis. EXAM: DIAGNOSTIC LUMBAR PUNCTURE UNDER FLUOROSCOPIC GUIDANCE FLUOROSCOPY TIME:  Radiation Exposure Index (as provided by the fluoroscopic device): If the device does not provide the exposure index: Fluoroscopy Time (in minutes and seconds):  1 minutes 30 seconds Number of Acquired Images:  1 PROCEDURE: Intubated patient. Informed consent was obtained from the patient's parents to the procedure, including potential complications of headache, allergy, and pain. With the patient prone, the lower back was prepped with Betadine. 1% Lidocaine was used for local anesthesia. Lumbar puncture was performed at the L3-L4 level using a  20 gauge needle with return of clear CSF with an opening pressure of 21 cm water. Nine ml of CSF were obtained for laboratory studies. The patient tolerated the procedure well and there were no apparent complications. IMPRESSION: Successful lumbar puncture. Electronically Signed   By: Genevive Bi M.D.   On: 05/17/2015 15:38    Medications:  Current facility-administered medications:  .  0.9 %  sodium chloride infusion, 250 mL, Intravenous, PRN, Cyril Mourning V, MD .  0.9 %  sodium chloride infusion, , Intravenous, Continuous, Lewayne Bunting, MD .  acetaminophen (TYLENOL) tablet 650 mg, 650 mg, Oral, Q4H PRN, Cyril Mourning V, MD, 650 mg at 05/18/15 0534 .  antiseptic oral rinse solution (CORINZ), 7 mL, Mouth Rinse, QID, Cyril Mourning V, MD, 7 mL at 05/18/15 0400 .  asenapine (SAPHRIS) sublingual tablet 5 mg, 5 mg, Sublingual, QHS, Jeanella Craze, NP, 5 mg at 05/17/15 2103 .  cefTRIAXone (ROCEPHIN) 2 g in dextrose 5 % 50 mL IVPB, 2 g, Intravenous, Q12H, Tamera Reason, RPH, 2 g at 05/18/15 4098 .  chlorhexidine gluconate (PERIDEX) 0.12 % solution 15 mL, 15 mL, Mouth Rinse, BID, Cyril Mourning V, MD, 15 mL at 05/17/15 2115 .  clonazePAM (KLONOPIN) tablet 0.5 mg, 0.5 mg, Oral, QHS, Jeanella Craze, NP, 0.5 mg at 05/17/15 2102 .  dextrose 5 %-0.45 % sodium chloride infusion, , Intravenous, Continuous, Leslye Peer, MD, Last Rate: 10 mL/hr at 05/18/15 0800 .  DULoxetine (CYMBALTA) DR capsule 60 mg, 60 mg, Oral, Daily, Jeanella Craze, NP, 60 mg at 05/15/15 1823 .  feeding supplement (PRO-STAT SUGAR FREE 64) liquid 60 mL, 60 mL, Per Tube, BID, Leslye Peer, MD, 60 mL at 05/17/15 2103 .  feeding supplement (VITAL HIGH PROTEIN) liquid 1,000 mL, 1,000 mL, Per Tube, Continuous, Leslye Peer, MD, Last Rate: 40 mL/hr at 05/18/15 0800, 1,000 mL at 05/18/15 0800 .  fentaNYL (SUBLIMAZE) injection 100 mcg, 100 mcg, Intravenous, Q15 min PRN, Cyril Mourning V, MD .  fentaNYL (SUBLIMAZE) injection 100 mcg, 100 mcg,  Intravenous, Q2H PRN, Cyril Mourning V, MD, 100 mcg at 05/17/15 0904 .  levETIRAcetam (KEPPRA) 1,000 mg in sodium chloride 0.9 % 100 mL IVPB, 1,000 mg, Intravenous, Q12H, Ram Daniel Nones, MD, 1,000 mg at 05/17/15 2102 .  LORazepam (ATIVAN) injection 1-2 mg, 1-2 mg, Intravenous, Q4H PRN, Cyril Mourning V, MD, 2 mg at 05/18/15 0752 .  pantoprazole sodium (PROTONIX) 40 mg/20 mL oral suspension 40 mg, 40 mg, Per Tube, Daily, Cyril Mourning V, MD, 40 mg at 05/17/15 1059 .  propofol (DIPRIVAN)  1000 MG/100ML infusion, 5-70 mcg/kg/min, Intravenous, Titrated, Karl Ito, MD, Stopped at 05/16/15 [phone] .  valproate (DEPACON) 500 mg in dextrose 5 % 50 mL IVPB, 500 mg, Intravenous, Q12H, Ulice Dash, PA-C, 500 mg at 05/17/15 2102 .  vancomycin (VANCOCIN) 1,500 mg in sodium chloride 0.9 % 500 mL IVPB, 1,500 mg, Intravenous, Q8H, Rachel L Rumbarger, RPH, 1,500 mg at 05/18/15 0757   Assessment/Plan: 1. Encephalopathy with waxy rigidity. Most consistent with catatonia. Continues to improve per nursing as well as on my exams. DDx unchanged, including neuroleptic malignant syndrome, toxic encephalopathy, catatonia secondary to surreptitious/undocumented neuroleptic use or withdrawal from undocumented or surreptitious use of a dopaminergic compound. Overall presentation would be atypical for benzodiazepine withdrawal. MRI study obtained 2/20 revealed stable and normal MRI appearance of the brain. LP by VIR yesterday successful. CSF results show normal glucose, elevated protein of 60, 22 RBC without xanthochromia and 2 WBC. Cryptococcal antigen was negative. HIV testing negative.  2. Of note, during yes/no questioning this AM, the patient nods "yes" when asked if he was trying to discontinue one of his home medications. He shakes head no when asked if he was withdrawing from the following: EtOH, amphetamine, cocaine. 3. Improving LFTs and CK level (2/22).  4. Repeat EEG yesterday showed  no focal, hemispheric, or  lateralizing features and was within normal limits for a sedated EEG.Most of the recording was spent in stage II sleep architecture.There was very little waking time on the recording, but during the brief period in which he was stimulated, the occipital dominant rhythm was normal. There was no epileptiform activity recorded. 5. Seizure this AM, broken with Ativan.   Recommendations: 1. Continue Keppra, Depakote and Klonopin.  2. Continue PRN Ativan.  3. Most likely will be able to discontinue ceftriaxone from the neurological standpoint, as CSF results not consistent with meningitis. Await results of CSF culture.  4. CIWA protocol.  5. DVT prophylaxis with SCDs.  6. 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 Dosing is 2.5 mg through nasogastric tube q6-8h and titrated up to a maximum dose of 40 mg/day. This can be continued for 10 days after NMS is controlled and then tapered slowly.  7. Pharmacy or toxicology consult to determine if any of the meds he was taking at home had enough of a dopaminergic effect to result in catatonia if it were to be abruptly discontinued.  8. Repeat EEG today.   A total of 35 minutes of bedside time and coordination of care was dedicated to the evaluation and management of this critically ill patient.    Caryl Pina, MD 05/18/2015, 8:03 AM

## 2015-05-18 NOTE — Progress Notes (Signed)
Pharmacy Antibiotic Note  Chase Hampton is a 25 y.o. male admitted on 05/15/2015 with bacteremia and meningitis.  Pharmacy had been consulted for vancomycin and ceftriaxone dosing but now de-escalating to cefazolin. Tmax is 101.3 and WBC is now WNL. Scr is stable at 0.76.   Plan: - Cefazolin 2gm IV Q8H - F/u renal fxn, C&S, clinical status  *Pharmacy will sign-off and only follow peripherally. Thank you for the consult!  Height:  (180.3 cm) (from [facility] on 05/15/15) Weight: 268 lb 4.8 oz (121.7 kg) IBW/kg (Calculated) : 75.3  Temp (24hrs), Avg:100.3 F (37.9 C), Min:99.3 F (37.4 C), Max:101.3 F (38.5 C)   Recent Labs Lab 05/15/15 1113 05/15/15 1420 05/15/15 1751 05/16/15 0331 05/17/15 0413 05/17/15 0936 05/18/15 0420  WBC 16.7*  --  13.6* 10.6* 10.4  --  8.8  CREATININE 1.06  --   --  0.86 0.95  --  0.76  LATICACIDVEN 2.0 1.9  --   --   --   --   --   VANCOTROUGH  --   --   --   --   --  7*  --     Estimated Creatinine Clearance: 192.4 mL/min (by C-G formula based on Cr of 0.76).    Allergies  Allergen Reactions  . Haldol [Haloperidol Lactate] Other (See Comments)    Unknown reaction [facility] lists no allergies as of 05/15/15)    Antimicrobials this admission: Cefazolin 2/22>> Vanc 2/19>>2/22 CTX 2/19>>2/22 Acyclovir x 1 2/19  Dose adjustments this admission: 2/21 VT = 7 on 1gm Q8H, changed to  Q8H  Microbiology results: 2/19 Blood - NGTD 2/19 Blood [facility]) - staph hominis  Thank you for allowing pharmacy to be a part of this patient's care.  Neftali Thurow, Drake Leach 05/18/2015 11:17 AM

## 2015-05-18 NOTE — Progress Notes (Signed)
Subjective: Intubated sedated  Antibiotics:  Anti-infectives    Start     Dose/Rate Route Frequency Ordered Stop   05/18/15 1200  ceFAZolin (ANCEF) IVPB 2 g/50 mL premix     2 g 100 mL/hr over 30 Minutes Intravenous Every 8 hours 05/18/15 1116     05/17/15 1600  vancomycin (VANCOCIN) 1,500 mg in sodium chloride 0.9 % 500 mL IVPB  Status:  Discontinued     1,500 mg 250 mL/hr over 120 Minutes Intravenous Every 8 hours 05/17/15 1141 05/18/15 1109   05/16/15 0600  cefTRIAXone (ROCEPHIN) 2 g in dextrose 5 % 50 mL IVPB  Status:  Discontinued     2 g 100 mL/hr over 30 Minutes Intravenous Every 12 hours 05/15/15 1653 05/18/15 1109   05/16/15 0200  vancomycin (VANCOCIN) IVPB 1000 mg/200 mL premix  Status:  Discontinued     1,000 mg 200 mL/hr over 60 Minutes Intravenous Every 8 hours 05/15/15 1713 05/17/15 1141   05/15/15 1800  vancomycin (VANCOCIN) 2,500 mg in sodium chloride 0.9 % 500 mL IVPB  Status:  Discontinued     2,500 mg 250 mL/hr over 120 Minutes Intravenous  Once 05/15/15 1713 05/15/15 1729   05/15/15 1800  vancomycin (VANCOCIN) IVPB 1000 mg/200 mL premix     1,000 mg 200 mL/hr over 60 Minutes Intravenous  Once 05/15/15 1729 05/15/15 1900   05/15/15 1715  cefTRIAXone (ROCEPHIN) 2 g in dextrose 5 % 50 mL IVPB     2 g 100 mL/hr over 30 Minutes Intravenous  Once 05/15/15 1652 05/15/15 1749      Medications: Scheduled Meds: . antiseptic oral rinse  7 mL Mouth Rinse QID  . asenapine  5 mg Sublingual QHS  .  ceFAZolin (ANCEF) IV  2 g Intravenous Q8H  . chlorhexidine gluconate  15 mL Mouth Rinse BID  . clonazePAM  1 mg Oral QHS  . DULoxetine  60 mg Oral Daily  . feeding supplement (PRO-STAT SUGAR FREE 64)  60 mL Per Tube BID  . levETIRAcetam  1,000 mg Intravenous Q12H  . neomycin-bacitracin-polymyxin   Topical BID  . pantoprazole sodium  40 mg Per Tube Daily  . valproate sodium  500 mg Intravenous Q12H   Continuous Infusions: . sodium chloride    . dextrose 5 %  and 0.45% NaCl 10 mL/hr at 05/18/15 0800  . feeding supplement (VITAL HIGH PROTEIN) 1,000 mL (05/18/15 0800)  . propofol (DIPRIVAN) infusion Stopped (05/16/15 0853)   PRN Meds:.sodium chloride, acetaminophen, fentaNYL (SUBLIMAZE) injection, fentaNYL (SUBLIMAZE) injection, LORazepam    Objective: Weight change: 7.1 oz (0.2 kg)  Intake/Output Summary (Last 24 hours) at 05/18/15 1215 Last data filed at 05/18/15 0800  Gross per 24 hour  Intake   1665 ml  Output    750 ml  Net    915 ml   Blood pressure 118/76, pulse 112, temperature 100.9 F (38.3 C), temperature source Axillary, resp. rate 20, height 5\' 11"  (1.803 m), weight 268 lb 4.8 oz (121.7 kg), SpO2 97 %. Temp:  [99.3 F (37.4 C)-101.3 F (38.5 C)] 100.9 F (38.3 C) (02/22 0800) Pulse Rate:  [87-120] 112 (02/22 0800) Resp:  [14-23] 20 (02/22 0800) BP: (89-130)/(58-110) 118/76 mmHg (02/22 0739) SpO2:  [96 %-100 %] 97 % (02/22 0800) FiO2 (%):  [30 %-40 %] 30 % (02/22 0739) Weight:  [268 lb 4.8 oz (121.7 kg)] 268 lb 4.8 oz (121.7 kg) (02/22 0500)  Physical Exam: General: Intubated sedated on ventilator  and restrained with restraints and also handcuffs on his ankles. HEENT: anicteric sclera, EOMI, ET tube in place Cardiovascular: Tachycardic, normal r, no murmur rubs or gallops Pulmonary: Fairly clear to auscultation bilaterally, no wheezing, rales or rhonchi Gastrointestinal: soft nontender, nondistended, normal bowel sounds, Musculoskeletal: no clubbing or edema noted bilaterally Skin, soft tissue: he reportedly had rash on his back but I could only appreicate an area of excoriation on neck Neuro: nonfocal  CBC:  CBC Latest Ref Rng 05/18/2015 05/17/2015 05/16/2015  WBC 4.0 - 10.5 K/uL 8.8 10.4 10.6(H)  Hemoglobin 13.0 - 17.0 g/dL 12.4(L) 13.1 13.5  Hematocrit 39.0 - 52.0 % 38.1(L) 39.7 41.4  Platelets 150 - 400 K/uL 75(L) 66(L) 82(L)       BMET  Recent Labs  05/17/15 0413 05/18/15 0420  NA 145 143  K 3.9  3.9  CL 106 106  CO2 31 27  GLUCOSE 115* 117*  BUN 15 19  CREATININE 0.95 0.76  CALCIUM 8.6* 8.5*     Liver Panel   Recent Labs  05/18/15 0420  PROT 5.0*  ALBUMIN 2.2*  AST 59*  ALT 58  ALKPHOS 45  BILITOT 0.8       Sedimentation Rate  Recent Labs  05/18/15 0954  ESRSEDRATE 25*   C-Reactive Protein No results for input(s): CRP in the last 72 hours.  Micro Results: Recent Results (from the past 720 hour(s))  Culture, blood (Routine X 2) w Reflex to ID Panel     Status: None (Preliminary result)   Collection Time: 05/15/15 11:13 AM  Result Value Ref Range Status   Specimen Description BLOOD LEFT ANTECUBITAL  Final   Special Requests BOTTLES DRAWN AEROBIC AND ANAEROBIC  1CC  Final   Culture  Setup Time   Final    GRAM POSITIVE COCCI IN CLUSTERS AEROBIC BOTTLE ONLY CRITICAL VALUE NOTED.  VALUE IS CONSISTENT WITH PREVIOUSLY REPORTED AND CALLED VALUE.    Culture STAPHYLOCOCCUS HOMINIS AEROBIC BOTTLE ONLY   Final   Report Status PENDING  Incomplete   Organism ID, Bacteria STAPHYLOCOCCUS HOMINIS  Final      Susceptibility   Staphylococcus hominis - MIC*    OXACILLIN Value in next row Sensitive      SENSITIVE<=0.25 WARNING: For oxacillin-resistant S.aureus and coagulase-negative staphylococci (MRS), other beta-lactam agents, ie, penicillins, beta-lactam/beta-lactamase inhibitor combinations, cephems (with the exception of the cephalosporins with anti-MRSA activity), and carbapenems, may appear active in vitro, but are not effective clinically.  --CLSI, Vol.32 No.3, January 2012, pg 70.    CIPROFLOXACIN Value in next row Sensitive      SENSITIVE<=0.5    LINEZOLID Value in next row Sensitive      SENSITIVE2    VANCOMYCIN Value in next row Sensitive      SENSITIVE<=0.5    * STAPHYLOCOCCUS HOMINIS  Culture, blood (Routine X 2) w Reflex to ID Panel     Status: None (Preliminary result)   Collection Time: 05/15/15 11:13 AM  Result Value Ref Range Status   Specimen  Description BLOOD LEFT WRIST  Final   Special Requests BOTTLES DRAWN AEROBIC AND ANAEROBIC  1CC  Final   Culture  Setup Time   Final    GRAM POSITIVE COCCI IN CLUSTERS ANAEROBIC BOTTLE ONLY CRITICAL RESULT CALLED TO, READ BACK BY AND VERIFIED WITH: KAREN HAYES 05/16/2015 1230 BY JRS    Culture   Final    COAGULASE NEGATIVE STAPHYLOCOCCUS ANAEROBIC BOTTLE ONLY IDENTIFICATION AND SUSCEPTIBILITIES TO FOLLOW    Report Status PENDING  Incomplete  Blood Culture ID Panel (Reflexed)     Status: Abnormal   Collection Time: 05/15/15 11:13 AM  Result Value Ref Range Status   Enterococcus species NOT DETECTED NOT DETECTED Final   Listeria monocytogenes NOT DETECTED NOT DETECTED Final   Staphylococcus species DETECTED (A) NOT DETECTED Final    Comment: CRITICAL RESULT CALLED TO, READ BACK BY AND VERIFIED WITH: KAREN HAYES 05/16/2015 1230 BY JRS.    Staphylococcus aureus NOT DETECTED NOT DETECTED Final   Streptococcus species NOT DETECTED NOT DETECTED Final   Streptococcus agalactiae NOT DETECTED NOT DETECTED Final   Streptococcus pneumoniae NOT DETECTED NOT DETECTED Final   Streptococcus pyogenes NOT DETECTED NOT DETECTED Final   Acinetobacter baumannii NOT DETECTED NOT DETECTED Final   Enterobacteriaceae species NOT DETECTED NOT DETECTED Final   Enterobacter cloacae complex NOT DETECTED NOT DETECTED Final   Escherichia coli NOT DETECTED NOT DETECTED Final   Klebsiella oxytoca NOT DETECTED NOT DETECTED Final   Klebsiella pneumoniae NOT DETECTED NOT DETECTED Final   Proteus species NOT DETECTED NOT DETECTED Final   Serratia marcescens NOT DETECTED NOT DETECTED Final   Haemophilus influenzae NOT DETECTED NOT DETECTED Final   Neisseria meningitidis NOT DETECTED NOT DETECTED Final   Pseudomonas aeruginosa NOT DETECTED NOT DETECTED Final   Candida albicans NOT DETECTED NOT DETECTED Final   Candida glabrata NOT DETECTED NOT DETECTED Final   Candida krusei NOT DETECTED NOT DETECTED Final   Candida  parapsilosis NOT DETECTED NOT DETECTED Final   Candida tropicalis NOT DETECTED NOT DETECTED Final   Carbapenem resistance NOT DETECTED NOT DETECTED Final   Methicillin resistance NOT DETECTED NOT DETECTED Final   Vancomycin resistance NOT DETECTED NOT DETECTED Final  MRSA PCR Screening     Status: None   Collection Time: 05/15/15  3:49 PM  Result Value Ref Range Status   MRSA by PCR NEGATIVE NEGATIVE Final    Comment:        The GeneXpert MRSA Assay (FDA approved for NASAL specimens only), is one component of a comprehensive MRSA colonization surveillance program. It is not intended to diagnose MRSA infection nor to guide or monitor treatment for MRSA infections.   Culture, blood (routine x 2)     Status: None (Preliminary result)   Collection Time: 05/15/15  9:53 PM  Result Value Ref Range Status   Specimen Description BLOOD RIGHT ANTECUBITAL  Final   Special Requests BOTTLES DRAWN AEROBIC ONLY 5CC  Final   Culture NO GROWTH 2 DAYS  Final   Report Status PENDING  Incomplete  Culture, blood (routine x 2)     Status: None (Preliminary result)   Collection Time: 05/15/15 10:06 PM  Result Value Ref Range Status   Specimen Description BLOOD RIGHT ANTECUBITAL  Final   Special Requests BOTTLES DRAWN AEROBIC ONLY 5CC  Final   Culture NO GROWTH 2 DAYS  Final   Report Status PENDING  Incomplete  CSF culture     Status: None (Preliminary result)   Collection Time: 05/17/15  3:01 PM  Result Value Ref Range Status   Specimen Description CSF  Final   Special Requests NONE  Final   Gram Stain   Final    CYTOSPIN SMEAR WBC PRESENT, PREDOMINANTLY MONONUCLEAR NO ORGANISMS SEEN    Culture PENDING  Incomplete   Report Status PENDING  Incomplete    Studies/Results: Mr Lodema Pilot Contrast  05/16/2015  CLINICAL DATA:  25 year old male with witnessed seizure in jail, unresponsive. Query meningitis. Initial encounter. EXAM: MRI HEAD  WITHOUT AND WITH CONTRAST TECHNIQUE: Multiplanar,  multiecho pulse sequences of the brain and surrounding structures were obtained without and with intravenous contrast. CONTRAST:  20mL MULTIHANCE GADOBENATE DIMEGLUMINE 529 MG/ML IV SOLN COMPARISON:  [facility] head CT without contrast 04/1915. Brain MRI without and with contrast 04/30/2015, and earlier. FINDINGS: Major intracranial vascular flow voids are stable. No restricted diffusion to suggest acute infarction. No midline shift, mass effect, evidence of mass lesion, ventriculomegaly, extra-axial collection or acute intracranial hemorrhage. Cervicomedullary junction and pituitary are within normal limits. Wallace Cullens and white matter signal remains normal. Hippocampal formations appear stable and within normal limits. No abnormal enhancement identified. No dural thickening or leptomeningeal enhancement is identified. Stable enhancement of the major intracranial venous structures. Visible internal auditory structures appear normal. Mastoids are clear. Mild right maxillary sinus mucosal thickening is unchanged, otherwise paranasal sinuses are clear. Orbit and scalp soft tissues remain within normal limits. Visualized bone marrow signal is within normal limits. Negative visualized cervical spine. IMPRESSION: Stable and normal MRI appearance of the brain. CSF analysis would be necessary to exclude meningitis. Electronically Signed   By: Odessa Fleming M.D.   On: 05/16/2015 17:51   Dg Chest Port 1 View  05/17/2015  CLINICAL DATA:  Acute respiratory failure EXAM: PORTABLE CHEST 1 VIEW COMPARISON:  05/16/2015 FINDINGS: Endotracheal tube and NG tube are unchanged. There are bibasilar opacities, likely atelectasis. Suspect small left pleural effusion. Heart is borderline in size. No acute bony abnormality. IMPRESSION: Bibasilar opacities, likely atelectasis.  Small left effusion. Electronically Signed   By: Charlett Nose M.D.   On: 05/17/2015 07:40   Dg Abd Portable 1v  05/17/2015  CLINICAL DATA:  Orogastric  tube placement EXAM: PORTABLE ABDOMEN - 1 VIEW COMPARISON:  Abdominal CT 05/15/2015 FINDINGS: Orogastric tube tip at pyloric region. Normal bowel gas pattern. Atelectatic type opacities at the left base, as seen on CT 05/15/2015 IMPRESSION: Orogastric tube tip at the pylorus. Electronically Signed   By: Marnee Spring M.D.   On: 05/17/2015 17:06   Dg Fluoro Guide Lumbar Puncture  05/17/2015  CLINICAL DATA:  Altered mental status, concern for meningitis. EXAM: DIAGNOSTIC LUMBAR PUNCTURE UNDER FLUOROSCOPIC GUIDANCE FLUOROSCOPY TIME:  Radiation Exposure Index (as provided by the fluoroscopic device): If the device does not provide the exposure index: Fluoroscopy Time (in minutes and seconds):  1 minutes 30 seconds Number of Acquired Images:  1 PROCEDURE: Intubated patient. Informed consent was obtained from the patient's parents to the procedure, including potential complications of headache, allergy, and pain. With the patient prone, the lower back was prepped with Betadine. 1% Lidocaine was used for local anesthesia. Lumbar puncture was performed at the L3-L4 level using a 20 gauge needle with return of clear CSF with an opening pressure of 21 cm water. Nine ml of CSF were obtained for laboratory studies. The patient tolerated the procedure well and there were no apparent complications. IMPRESSION: Successful lumbar puncture. Electronically Signed   By: Genevive Bi M.D.   On: 05/17/2015 15:38      Assessment/Plan:  INTERVAL HISTORY:  05/18/15: LP from yesterday benign, TEE without vegetations, he had another seizure today   Active Problems:   Status epilepticus (HCC)   Seizures (HCC)   Acute respiratory failure (HCC)   Coagulase negative Staphylococcus bacteremia   Bipolar I disorder, most recent episode depressed (HCC)   Suicidal ideation   Homicidal ideation   Impulse control disease   Acute encephalopathy    Chase Hampton is a 25  y.o. male with  Depression, ? Bipolar disorder  recent homicidal ideation, suicidal ideation who had several health inflicted head injuries while in police custody in jail, was refusing to take his medications including Depakote while in jail and then was found to have had a seizure brought to the hospital and ultimately intubated and transferred to [facility]. His admission blood cultures at [facility] were positive for coagulase-negative Staphylococcus   #1 Coagulase-negative Staphylococcus bacteremia:  His TEE was negative and LP benign so he does not have endocarditis or CNS involvement (and MRI brain was normal as wel)  Organism is S to OX. It is NOT a CONTAMINANT given in 2/2 blood cultures but is a genuine bacteremia  I wonder if there is IVDU in his history or if it came in from skin portal  Given no endocarditis IF we do not see evidence of other deep infection should be able to treat this with 2 week course of IV abx  #2 HIV negative, HCV ab pending   LOS: 3 days   Acey Lav 05/18/2015, 12:15 PM

## 2015-05-18 NOTE — Progress Notes (Signed)
PULMONARY / CRITICAL CARE MEDICINE   Name: Chase Hampton MRN: [id number] DOB: 03/25/1991    ADMISSION DATE:  05/15/2015 CONSULTATION DATE:  05/15/15  REFERRING MD:  Dr. Charlesetta Garibaldi   CHIEF COMPLAINT:  Seizure, Acute Respiratory Failure   HISTORY OF PRESENT ILLNESS:  25 y/o M with PMH of GERD, anxiety and depression (prior [facility] hospitalizations) with recent admission to [facility] in [place] from 2/3-2/8 for major depressive disorder with homicidal / suicidal ideations after a recent break up with his fiancee.  Due to restraining order violations, he was transferred from [facility] to [facility] jail were he has been on suicide watch since arrival.  The patient reportedly refused to eat, drink or take medications.  He was reportedly seen lying in the floor and would urinate on himself.  After multiple episodes of urinating on himself, he as asked to mop up the floor which he was able to perform.  Prior to discharge from [facility], notes reflect he had multiple episodes of physically aggressive behavior - including striking his own head on the seclusion room walls.  At discharge he was to continue on Cymbalta, Depakote and Saphris.   Prison staff report the patient refused all medications after arrival.    On 2/19, the patient was noted to have a seizure in jail.  He was transported to [facility] ER via EMS.  It is documented that he arrived to the ER with urine and feces all over him.  There is question if patient was weaning off ativan?? Per [facility] notes but discharge note from [facility] does not include Rx for ativan. Room air saturations were 82%, he was treated with 100% NRB.  He was treated with 43m IV versed per EMS in route.  The patient had a second seizure in ER and was ultimately intubated.  [facility]    The patient arrived on propofol, mechanically intubated.    SUBJECTIVE:  Focal mouth seizures noted in am , ativan started  VITAL SIGNS: BP 118/76 mmHg  Pulse 112  Temp(Src)  100.9 F (38.3 C) (Axillary)  Resp 20  Ht _0  (1.803 m)  Wt 121.7 kg (268 lb 4.8 oz)  BMI 37.44 kg/m2  SpO2 97%  HEMODYNAMICS:    VENTILATOR SETTINGS: Vent Mode:  [-] PSV;CPAP FiO2 (%):  [30 %-40 %] 30 % Set Rate:  [15 bmp] 15 bmp Vt Set:  [600 mL] 600 mL PEEP:  [5 cmH20] 5 cmH20 Pressure Support:  [5 cmH20] 5 cmH20 Plateau Pressure:  [18 cmH20-22 cmH20] 22 cmH20  INTAKE / OUTPUT: I/O last 3 completed shifts: In: 3871.7 [I.V.:1596.7; NG/GT:230; IV Piggyback:2045] Out: 1370 [[LNLGX:2119] PHYSICAL EXAMINATION: General:  Well developed adult male in NAD on vent  Neuro:  No seizure activity now, prior to am seizure followed commands well HEENT:  ETT Cardiovascular:  s1s2 rrt, no m/r/g Lungs:  CTA Abdomen:  Obese/soft, bsx4 active  Musculoskeletal:  No acute deformities  Skin:  Cool /dry, old bruising to left thigh  LABS:  BMET  Recent Labs Lab 05/16/15 0331 05/17/15 0413 05/18/15 0420  NA 146* 145 143  K 3.8 3.9 3.9  CL 112* 106 106  CO2 _1 BUN _2 CREATININE 0.86 0.95 0.76  GLUCOSE 115* 115* 117*    Electrolytes  Recent Labs Lab 05/16/15 0331 05/17/15 0413 05/18/15 0420  CALCIUM 8.7* 8.6* 8.5*  MG 2.2  --   --   PHOS 3.2  --   --  CBC  Recent Labs Lab 05/16/15 0331 05/17/15 0413 05/18/15 0420  WBC 10.6* 10.4 8.8  HGB 13.5 13.1 12.4*  HCT 41.4 39.7 38.1*  PLT 82* 66* 75*    Coag's  Recent Labs Lab 05/15/15 1618  APTT 34  INR 1.34    Sepsis Markers  Recent Labs Lab 05/15/15 1113 05/15/15 1420 05/15/15 1751  LATICACIDVEN 2.0 1.9  --   PROCALCITON  --   --  <0.10    ABG  Recent Labs Lab 05/15/15 1114  PHART 7.49*  PCO2ART 40  PO2ART 101    Liver Enzymes  Recent Labs Lab 05/15/15 1113 05/18/15 0420  AST 184* 59*  ALT 92* 58  ALKPHOS 65 45  BILITOT 1.5* 0.8  ALBUMIN 3.9 2.2*    Cardiac Enzymes  Recent Labs Lab 05/15/15 1113  TROPONINI 0.05*    Glucose  Recent Labs Lab  05/15/15 1248 05/17/15 2328 05/18/15 0328 05/18/15 0802  GLUCAP 87 101* 114* 114*    Imaging Dg Abd Portable 1v  05/17/2015  CLINICAL DATA:  Orogastric tube placement EXAM: PORTABLE ABDOMEN - 1 VIEW COMPARISON:  Abdominal CT 05/15/2015 FINDINGS: Orogastric tube tip at pyloric region. Normal bowel gas pattern. Atelectatic type opacities at the left base, as seen on CT 05/15/2015 IMPRESSION: Orogastric tube tip at the pylorus. Electronically Signed   By: Monte Fantasia M.D.   On: 05/17/2015 17:06   Dg Fluoro Guide Lumbar Puncture  05/17/2015  CLINICAL DATA:  Altered mental status, concern for meningitis. EXAM: DIAGNOSTIC LUMBAR PUNCTURE UNDER FLUOROSCOPIC GUIDANCE FLUOROSCOPY TIME:  Radiation Exposure Index (as provided by the fluoroscopic device): If the device does not provide the exposure index: Fluoroscopy Time (in minutes and seconds):  1 minutes 30 seconds Number of Acquired Images:  1 PROCEDURE: Intubated patient. Informed consent was obtained from the patient's parents to the procedure, including potential complications of headache, allergy, and pain. With the patient prone, the lower back was prepped with Betadine. 1% Lidocaine was used for local anesthesia. Lumbar puncture was performed at the L3-L4 level using a 20 gauge needle with return of clear CSF with an opening pressure of 21 cm water. Nine ml of CSF were obtained for laboratory studies. The patient tolerated the procedure well and there were no apparent complications. IMPRESSION: Successful lumbar puncture. Electronically Signed   By: Suzy Bouchard M.D.   On: 05/17/2015 15:38    STUDIES:  LP 2/20 >>  MRI brain 2/20 >> cancelled  EEG 2/20 >>  No active seizures.   CULTURES: LP 2/21 >>   ANTIBIOTICS: Rocephin 2gm 2/19 >>  Vanco 2/20 >>   SIGNIFICANT EVENTS: 2/19  Admit from jail with seizure, intubated  2/22- focal seizures mouth but LOS associated  LINES/TUBES: ETT 2/19 >>   DISCUSSION: 25 y/o M with PMH of  major depression with multiple PSY admissions admitted 2/19 with seizure (no hx). Intubated in ER and transferred to [facility] ICU.    ASSESSMENT / PLAN:  PULMONARY A: Acute Hypoxic Respiratory Failure - in setting of seizure, brief hypoxia, resolved  P:   PRVC 8cc/kg Wean PEEP / FiO2 for sats > 92% ABG reviewed, may need mV rest reduced 5/5 cpap ps, wean okay, no extubation with am events  CARDIOVASCULAR A:  Hypotension - resolved R/o endocarditis vs contamination P:  ICU monitoring of hemodynamics  D51/2 NS @ KVO TEE neg noted Repeat BC Get ESR, assss minor criteria Will esnure he has no hardware with coag neg staph  RENAL A:  Rhabdomyolysis - admit CK >7500 At Risk AKI - in setting of rhabdo, hypotension P:   Ensure adequate hydration- done kvo Chem in am further  GASTROINTESTINAL A:   Obesity  P:   NPO / OGT Begin TF 2/21 SUP while on MV Assess new BM  HEMATOLOGIC A:   Leukocytosis  Thrombocytopenia (lft wnl) P:  Trend CBC  scd Get hep panel  INFECTIOUS A:   R/O Meningitis neg LP overall Contact rash, posterior neck without clear evidence cellulitis R/o contamination coag neg staph P:   Repeat BC Esr Keep vanc, rocephin, await repeat BC above  ENDOCRINE A:   Mild hyperglycemia  P:   Trend glucose on BMP   NEUROLOGIC A:   Seizure - suspect related to abrupt cessation of medications +/- benzo withdrawal.  Unlikely   cpag neg staph in blood At risk epidural asbcess P:   RASS goal: 0  PRN fentanyl for pain  Continue home saphris, klonopin, depakote, cymbalta  Ativan PRN seizure WUA qam Neuro recs for am event If coags neg staph regrow will need full spine MRI for epidural abscess  FAMILY  - Updates:  Will review status with family 2/20  - Inter-disciplinary family meet or Palliative Care meeting due by: 2/26  Ccm time 30 min  Lavon Paganini. Titus Mould, MD, [place] Pgr: [facility]'

## 2015-05-18 NOTE — Progress Notes (Signed)
Upon assessment, pt had rhythmic movements localized to the lower face, pupils dilated, pt no longer responsive to vocal stimulation as he previously was. Pt more rigid than previous assessment as well.  MD notified, 2 mg Ativan given IV.  Seizure-like activity continued for appx 2 minutes post-administration.  Pt now without symptoms.  Will monitor.

## 2015-05-19 LAB — HEPATITIS PANEL, ACUTE
HEP A IGM: NEGATIVE
HEP B C IGM: NEGATIVE
HEP B S AG: NEGATIVE

## 2015-05-19 LAB — GLUCOSE, CAPILLARY
GLUCOSE-CAPILLARY: 114 mg/dL — AB (ref 65–99)
GLUCOSE-CAPILLARY: 100 mg/dL — AB (ref 65–99)
GLUCOSE-CAPILLARY: 111 mg/dL — AB (ref 65–99)
Glucose-Capillary: 107 mg/dL — ABNORMAL HIGH (ref 65–99)
Glucose-Capillary: 94 mg/dL (ref 65–99)
Glucose-Capillary: 95 mg/dL (ref 65–99)
Glucose-Capillary: 96 mg/dL (ref 65–99)

## 2015-05-19 LAB — HEPATITIS C ANTIBODY (REFLEX)

## 2015-05-19 LAB — HCV COMMENT:

## 2015-05-19 NOTE — Progress Notes (Signed)
PULMONARY / CRITICAL CARE MEDICINE   Name: Chase Hampton MRN: [id number] DOB: May 01, 1990    ADMISSION DATE:  05/15/2015 CONSULTATION DATE:  05/15/15  REFERRING MD:  Dr. Charlesetta Garibaldi   CHIEF COMPLAINT:  Seizure, Acute Respiratory Failure   HISTORY OF PRESENT ILLNESS:  25 y/o M with PMH of GERD, anxiety and depression (prior [facility] hospitalizations) with recent admission to [facility] in [place] from 2/3-2/8 for major depressive disorder with homicidal / suicidal ideations after a recent break up with his fiancee.  Due to restraining order violations, he was transferred from [facility] to [facility] jail were he has been on suicide watch since arrival.  The patient reportedly refused to eat, drink or take medications.  He was reportedly seen lying in the floor and would urinate on himself.  After multiple episodes of urinating on himself, he as asked to mop up the floor which he was able to perform.  Prior to discharge from [facility], notes reflect he had multiple episodes of physically aggressive behavior - including striking his own head on the seclusion room walls.  At discharge he was to continue on Cymbalta, Depakote and Saphris.   Prison staff report the patient refused all medications after arrival.    On 2/19, the patient was noted to have a seizure in jail.  He was transported to [facility] ER via EMS.  It is documented that he arrived to the ER with urine and feces all over him.  There is question if patient was weaning off ativan?? Per [facility] notes but discharge note from [facility] does not include Rx for ativan. Room air saturations were 82%, he was treated with 100% NRB.  He was treated with 15m IV versed per EMS in route.  The patient had a second seizure in ER and was ultimately intubated.  [facility]    The patient arrived on propofol, mechanically intubated.    SUBJECTIVE:  No further seizures overnight, remains lethargic  VITAL SIGNS: BP 118/68 mmHg  Pulse 108  Temp(Src)  100.8 F (38.2 C) (Axillary)  Resp 15  Ht _0  (1.803 m)  Wt 121.2 kg (267 lb 3.2 oz)  BMI 37.28 kg/m2  SpO2 95%  HEMODYNAMICS:    VENTILATOR SETTINGS: Vent Mode:  [-] PSV;CPAP FiO2 (%):  [30 %] 30 % Set Rate:  [15 bmp] 15 bmp Vt Set:  [600 mL] 600 mL PEEP:  [5 cmH20] 5 cmH20 Pressure Support:  [5 cmH20] 5 cmH20 Plateau Pressure:  [18 cmH20-21 cmH20] 21 cmH20  INTAKE / OUTPUT: I/O last 3 completed shifts: In: 2885 [I.V.:350; NG/GT:1340; IV Piggyback:1195] Out: 19562[Urine:1489]  PHYSICAL EXAMINATION: General:  Well developed adult male in NAD on vent  Neuro:  No seizure activity now, lethargic, not fc, perr 4-5 mm, limited cough HEENT:  ETT Cardiovascular:  s1s2 rrt, no m/r/g Lungs:  CTA Abdomen:  Obese/soft, bsx4 active  Musculoskeletal:  No acute deformities  Skin:  Cool /dry, old bruising to left thigh  LABS:  BMET  Recent Labs Lab 05/16/15 0331 05/17/15 0413 05/18/15 0420  NA 146* 145 143  K 3.8 3.9 3.9  CL 112* 106 106  CO2 _1 BUN _2 CREATININE 0.86 0.95 0.76  GLUCOSE 115* 115* 117*    Electrolytes  Recent Labs Lab 05/16/15 0331 05/17/15 0413 05/18/15 0420  CALCIUM 8.7* 8.6* 8.5*  MG 2.2  --   --   PHOS 3.2  --   --  CBC  Recent Labs Lab 05/16/15 0331 05/17/15 0413 05/18/15 0420  WBC 10.6* 10.4 8.8  HGB 13.5 13.1 12.4*  HCT 41.4 39.7 38.1*  PLT 82* 66* 75*    Coag's  Recent Labs Lab 05/15/15 1618  APTT 34  INR 1.34    Sepsis Markers  Recent Labs Lab 05/15/15 1113 05/15/15 1420 05/15/15 1751  LATICACIDVEN 2.0 1.9  --   PROCALCITON  --   --  <0.10    ABG  Recent Labs Lab 05/15/15 1114  PHART 7.49*  PCO2ART 40  PO2ART 101    Liver Enzymes  Recent Labs Lab 05/15/15 1113 05/18/15 0420  AST 184* 59*  ALT 92* 58  ALKPHOS 65 45  BILITOT 1.5* 0.8  ALBUMIN 3.9 2.2*    Cardiac Enzymes  Recent Labs Lab 05/15/15 1113  TROPONINI 0.05*    Glucose  Recent Labs Lab 05/18/15 1134  05/18/15 1511 05/18/15 2003 05/18/15 2339 05/19/15 0307 05/19/15 0734  GLUCAP 97 103* 96 107* 94 114*    Imaging No results found.  STUDIES:  LP 2/20 >>  MRI brain 2/20 >> cancelled  EEG 2/20 >>  No active seizures.   CULTURES: LP 2/21 >>>  Reported coag neg staph BC, assume 19th [facility] 2/19>>>> BC 2/22>>>  ANTIBIOTICS: Rocephin 2gm 2/19 >>  Vanco 2/20 >>   SIGNIFICANT EVENTS: 2/19  Admit from jail with seizure, intubated  2/22- focal seizures mouth but LOS associated  LINES/TUBES: ETT 2/19 >>   DISCUSSION: 25 y/o M with PMH of major depression with multiple PSY admissions admitted 2/19 with seizure (no hx). Intubated in ER and transferred to [facility] ICU.    ASSESSMENT / PLAN:  PULMONARY A: Acute Hypoxic Respiratory Failure - in setting of seizure, brief hypoxia, resolved  P:   Neurostatus does NOT support extubation Wean cpap 5 ps 5, goal 2 hr, unable to remove ETT Get pcxr in am   CARDIOVASCULAR A:  Hypotension - resolved R/o endocarditis vs contamination P:  ICU monitoring of hemodynamics  KVO TEE neg noted Repeat BC sent to follow Get ESR - low  RENAL A:   Rhabdomyolysis - admit CK >7500 P:   kvo Chem in am  GASTROINTESTINAL A:   Obesity  P:   TF to goal SUP while on MV Assess new BM, may need further catharctic  HEMATOLOGIC A:   Leukocytosis  Thrombocytopenia (lft wnl) P:  Trend CBC in am for plat scd Get hep panel -  pending  INFECTIOUS A:   R/o contamination coag neg staph P:   Repeat BC neg, follow Esr low In am will dc vanc, rocephin if remains cultrue neg  ENDOCRINE A:   Mild hyperglycemia  P:   Trend glucose on BMP   NEUROLOGIC A:   Seizure - suspect related to abrupt cessation of medications +/- benzo withdrawal.  Unlikely   At risk epidural asbcess P:   RASS goal: 0  PRN fentanyl for pain  Continue home saphris, klonopin, depakote, cymbalta  Ativan PRN seizure WUA qam, limiting all sedation Neuro recs  discussed in person at bedside If coags neg staph regrow will need full spine MRI for epidural abscess - neg thus far  FAMILY  - Updates: Police not allowing direct updates to family. Complying with police   - Inter-disciplinary family meet or Palliative Care meeting due by: 2/26  Ccm time 30 min  Lavon Paganini. Titus Mould, MD, [place] Pgr: [facility]'

## 2015-05-19 NOTE — Progress Notes (Signed)
Subjective: Intubated sedated  Antibiotics:  Anti-infectives    Start     Dose/Rate Route Frequency Ordered Stop   05/18/15 1200  ceFAZolin (ANCEF) IVPB 2 g/50 mL premix     2 g 100 mL/hr over 30 Minutes Intravenous Every 8 hours 05/18/15 1116     05/17/15 1600  vancomycin (VANCOCIN) 1,500 mg in sodium chloride 0.9 % 500 mL IVPB  Status:  Discontinued     1,500 mg 250 mL/hr over 120 Minutes Intravenous Every 8 hours 05/17/15 1141 05/18/15 1109   05/16/15 0600  cefTRIAXone (ROCEPHIN) 2 g in dextrose 5 % 50 mL IVPB  Status:  Discontinued     2 g 100 mL/hr over 30 Minutes Intravenous Every 12 hours 05/15/15 1653 05/18/15 1109   05/16/15 0200  vancomycin (VANCOCIN) IVPB 1000 mg/200 mL premix  Status:  Discontinued     1,000 mg 200 mL/hr over 60 Minutes Intravenous Every 8 hours 05/15/15 1713 05/17/15 1141   05/15/15 1800  vancomycin (VANCOCIN) 2,500 mg in sodium chloride 0.9 % 500 mL IVPB  Status:  Discontinued     2,500 mg 250 mL/hr over 120 Minutes Intravenous  Once 05/15/15 1713 05/15/15 1729   05/15/15 1800  vancomycin (VANCOCIN) IVPB 1000 mg/200 mL premix     1,000 mg 200 mL/hr over 60 Minutes Intravenous  Once 05/15/15 1729 05/15/15 1900   05/15/15 1715  cefTRIAXone (ROCEPHIN) 2 g in dextrose 5 % 50 mL IVPB     2 g 100 mL/hr over 30 Minutes Intravenous  Once 05/15/15 1652 05/15/15 1749      Medications: Scheduled Meds: . antiseptic oral rinse  7 mL Mouth Rinse QID  . asenapine  5 mg Sublingual QHS  .  ceFAZolin (ANCEF) IV  2 g Intravenous Q8H  . chlorhexidine gluconate  15 mL Mouth Rinse BID  . clonazePAM  1 mg Oral QHS  . DULoxetine  60 mg Oral Daily  . feeding supplement (PRO-STAT SUGAR FREE 64)  60 mL Per Tube BID  . levETIRAcetam  1,000 mg Intravenous Q12H  . neomycin-bacitracin-polymyxin   Topical BID  . pantoprazole sodium  40 mg Per Tube Daily  . valproate sodium  500 mg Intravenous Q12H   Continuous Infusions: . sodium chloride    . dextrose 5 %  and 0.45% NaCl 10 mL/hr at 05/19/15 1500  . feeding supplement (VITAL HIGH PROTEIN) 1,000 mL (05/19/15 1500)  . propofol (DIPRIVAN) infusion Stopped (05/16/15 0853)   PRN Meds:.sodium chloride, acetaminophen, fentaNYL (SUBLIMAZE) injection, fentaNYL (SUBLIMAZE) injection, LORazepam    Objective: Weight change: -1 lb 1.6 oz (-0.5 kg)  Intake/Output Summary (Last 24 hours) at 05/19/15 1508 Last data filed at 05/19/15 1500  Gross per 24 hour  Intake   2270 ml  Output   1119 ml  Net   1151 ml   Blood pressure 102/73, pulse 108, temperature 100.2 F (37.9 C), temperature source Axillary, resp. rate 16, height  (1.803 m), weight 267 lb 3.2 oz (121.2 kg), SpO2 95 %. Temp:  [97.7 F (36.5 C)-100.8 F (38.2 C)] 100.2 F (37.9 C) (02/23 1120) Pulse Rate:  [99-121] 108 (02/23 1500) Resp:  [15-24] 16 (02/23 1500) BP: (102-123)/(68-81) 102/73 mmHg (02/23 1500) SpO2:  [92 %-100 %] 95 % (02/23 1500) FiO2 (%):  [30 %] 30 % (02/23 1303) Weight:  [267 lb 3.2 oz (121.2 kg)] 267 lb 3.2 oz (121.2 kg) (02/23 1610)  Physical Exam: General: Intubated sedated on ventilator and  restrained with restraints and also handcuffs on his ankles. HEENT: anicteric sclera, EOMI, ET tube in place Cardiovascular: Tachycardic, normal r, no murmur rubs or gallops Pulmonary: Fairly clear to auscultation bilaterally, no wheezing, rales or rhonchi Gastrointestinal: soft nontender, nondistended, normal bowel sounds, Musculoskeletal: no clubbing or edema noted bilaterally Skin, soft tissue: he reportedly had rash on his back but I could only appreicate an area of excoriation on neck Neuro: nonfocal  CBC:  CBC Latest Ref Rng 05/18/2015 05/17/2015 05/16/2015  WBC 4.0 - 10.5 K/uL 8.8 10.4 10.6(H)  Hemoglobin 13.0 - 17.0 g/dL 12.4(L) 13.1 13.5  Hematocrit 39.0 - 52.0 % 38.1(L) 39.7 41.4  Platelets 150 - 400 K/uL 75(L) 66(L) 82(L)       BMET  Recent Labs  05/17/15 0413 05/18/15 0420  NA 145 143  K 3.9  3.9  CL 106 106  CO2 31 27  GLUCOSE 115* 117*  BUN 15 19  CREATININE 0.95 0.76  CALCIUM 8.6* 8.5*     Liver Panel   Recent Labs  05/18/15 0420  PROT 5.0*  ALBUMIN 2.2*  AST 59*  ALT 58  ALKPHOS 45  BILITOT 0.8       Sedimentation Rate  Recent Labs  05/18/15 0954  ESRSEDRATE 25*   C-Reactive Protein No results for input(s): CRP in the last 72 hours.  Micro Results: Recent Results (from the past 720 hour(s))  Culture, blood (Routine X 2) w Reflex to ID Panel     Status: None (Preliminary result)   Collection Time: 05/15/15 11:13 AM  Result Value Ref Range Status   Specimen Description BLOOD LEFT ANTECUBITAL  Final   Special Requests BOTTLES DRAWN AEROBIC AND ANAEROBIC  1CC  Final   Culture  Setup Time   Final    GRAM POSITIVE COCCI IN CLUSTERS AEROBIC BOTTLE ONLY CRITICAL VALUE NOTED.  VALUE IS CONSISTENT WITH PREVIOUSLY REPORTED AND CALLED VALUE.    Culture STAPHYLOCOCCUS HOMINIS AEROBIC BOTTLE ONLY   Final   Report Status PENDING  Incomplete   Organism ID, Bacteria STAPHYLOCOCCUS HOMINIS  Final      Susceptibility   Staphylococcus hominis - MIC*    OXACILLIN Value in next row Sensitive      SENSITIVE<=0.25 WARNING: For oxacillin-resistant S.aureus and coagulase-negative staphylococci (MRS), other beta-lactam agents, ie, penicillins, beta-lactam/beta-lactamase inhibitor combinations, cephems (with the exception of the cephalosporins with anti-MRSA activity), and carbapenems, may appear active in vitro, but are not effective clinically.  --CLSI, Vol.32 No.3, January 2012, pg 70.    CIPROFLOXACIN Value in next row Sensitive      SENSITIVE<=0.5    LINEZOLID Value in next row Sensitive      SENSITIVE2    VANCOMYCIN Value in next row Sensitive      SENSITIVE<=0.5    * STAPHYLOCOCCUS HOMINIS  Culture, blood (Routine X 2) w Reflex to ID Panel     Status: None (Preliminary result)   Collection Time: 05/15/15 11:13 AM  Result Value Ref Range Status   Specimen  Description BLOOD LEFT WRIST  Final   Special Requests BOTTLES DRAWN AEROBIC AND ANAEROBIC  1CC  Final   Culture  Setup Time   Final    GRAM POSITIVE COCCI IN CLUSTERS ANAEROBIC BOTTLE ONLY CRITICAL RESULT CALLED TO, READ BACK BY AND VERIFIED WITH: KAREN HAYES 05/16/2015 1230 BY JRS    Culture STAPHYLOCOCCUS HOMINIS ANAEROBIC BOTTLE ONLY   Final   Report Status PENDING  Incomplete   Organism ID, Bacteria STAPHYLOCOCCUS HOMINIS  Final  Susceptibility   Staphylococcus hominis - MIC*    CIPROFLOXACIN Value in next row Sensitive      SENSITIVE<=0.5    OXACILLIN Value in next row Sensitive      SENSITIVE<=0.25 WARNING: For oxacillin-resistant S.aureus and coagulase-negative staphylococci (MRS), other beta-lactam agents, ie, penicillins, beta-lactam/beta-lactamase inhibitor combinations, cephems (with the exception of the cephalosporins with anti-MRSA activity), and carbapenems, may appear active in vitro, but are not effective clinically.  --CLSI, Vol.32 No.3, January 2012, pg 70.    VANCOMYCIN Value in next row Sensitive      SENSITIVE<=0.5    LINEZOLID Value in next row Sensitive      SENSITIVE2    * STAPHYLOCOCCUS HOMINIS  Blood Culture ID Panel (Reflexed)     Status: Abnormal   Collection Time: 05/15/15 11:13 AM  Result Value Ref Range Status   Enterococcus species NOT DETECTED NOT DETECTED Final   Listeria monocytogenes NOT DETECTED NOT DETECTED Final   Staphylococcus species DETECTED (A) NOT DETECTED Final    Comment: CRITICAL RESULT CALLED TO, READ BACK BY AND VERIFIED WITH: KAREN HAYES 05/16/2015 1230 BY JRS.    Staphylococcus aureus NOT DETECTED NOT DETECTED Final   Streptococcus species NOT DETECTED NOT DETECTED Final   Streptococcus agalactiae NOT DETECTED NOT DETECTED Final   Streptococcus pneumoniae NOT DETECTED NOT DETECTED Final   Streptococcus pyogenes NOT DETECTED NOT DETECTED Final   Acinetobacter baumannii NOT DETECTED NOT DETECTED Final   Enterobacteriaceae  species NOT DETECTED NOT DETECTED Final   Enterobacter cloacae complex NOT DETECTED NOT DETECTED Final   Escherichia coli NOT DETECTED NOT DETECTED Final   Klebsiella oxytoca NOT DETECTED NOT DETECTED Final   Klebsiella pneumoniae NOT DETECTED NOT DETECTED Final   Proteus species NOT DETECTED NOT DETECTED Final   Serratia marcescens NOT DETECTED NOT DETECTED Final   Haemophilus influenzae NOT DETECTED NOT DETECTED Final   Neisseria meningitidis NOT DETECTED NOT DETECTED Final   Pseudomonas aeruginosa NOT DETECTED NOT DETECTED Final   Candida albicans NOT DETECTED NOT DETECTED Final   Candida glabrata NOT DETECTED NOT DETECTED Final   Candida krusei NOT DETECTED NOT DETECTED Final   Candida parapsilosis NOT DETECTED NOT DETECTED Final   Candida tropicalis NOT DETECTED NOT DETECTED Final   Carbapenem resistance NOT DETECTED NOT DETECTED Final   Methicillin resistance NOT DETECTED NOT DETECTED Final   Vancomycin resistance NOT DETECTED NOT DETECTED Final  MRSA PCR Screening     Status: None   Collection Time: 05/15/15  3:49 PM  Result Value Ref Range Status   MRSA by PCR NEGATIVE NEGATIVE Final    Comment:        The GeneXpert MRSA Assay (FDA approved for NASAL specimens only), is one component of a comprehensive MRSA colonization surveillance program. It is not intended to diagnose MRSA infection nor to guide or monitor treatment for MRSA infections.   Culture, blood (routine x 2)     Status: None (Preliminary result)   Collection Time: 05/15/15  9:53 PM  Result Value Ref Range Status   Specimen Description BLOOD RIGHT ANTECUBITAL  Final   Special Requests BOTTLES DRAWN AEROBIC ONLY 5CC  Final   Culture NO GROWTH 4 DAYS  Final   Report Status PENDING  Incomplete  Culture, blood (routine x 2)     Status: None (Preliminary result)   Collection Time: 05/15/15 10:06 PM  Result Value Ref Range Status   Specimen Description BLOOD RIGHT ANTECUBITAL  Final   Special Requests BOTTLES  DRAWN AEROBIC ONLY  5CC  Final   Culture NO GROWTH 4 DAYS  Final   Report Status PENDING  Incomplete  CSF culture     Status: None (Preliminary result)   Collection Time: 05/17/15  3:01 PM  Result Value Ref Range Status   Specimen Description CSF  Final   Special Requests NONE  Final   Gram Stain   Final    CYTOSPIN SMEAR WBC PRESENT, PREDOMINANTLY MONONUCLEAR NO ORGANISMS SEEN    Culture NO GROWTH 2 DAYS  Final   Report Status PENDING  Incomplete  Culture, blood (Routine X 2) w Reflex to ID Panel     Status: None (Preliminary result)   Collection Time: 05/18/15 10:10 AM  Result Value Ref Range Status   Specimen Description BLOOD RIGHT HAND  Final   Special Requests IN PEDIATRIC BOTTLE 3CC  Final   Culture NO GROWTH 1 DAY  Final   Report Status PENDING  Incomplete  Culture, blood (Routine X 2) w Reflex to ID Panel     Status: None (Preliminary result)   Collection Time: 05/18/15 10:15 AM  Result Value Ref Range Status   Specimen Description BLOOD LEFT HAND  Final   Special Requests IN PEDIATRIC BOTTLE 3CC  Final   Culture NO GROWTH 1 DAY  Final   Report Status PENDING  Incomplete    Studies/Results: Dg Abd Portable 1v  05/17/2015  CLINICAL DATA:  Orogastric tube placement EXAM: PORTABLE ABDOMEN - 1 VIEW COMPARISON:  Abdominal CT 05/15/2015 FINDINGS: Orogastric tube tip at pyloric region. Normal bowel gas pattern. Atelectatic type opacities at the left base, as seen on CT 05/15/2015 IMPRESSION: Orogastric tube tip at the pylorus. Electronically Signed   By: Marnee Spring M.D.   On: 05/17/2015 17:06      Assessment/Plan:  INTERVAL HISTORY:  05/18/15: LP from yesterday benign, TEE without vegetations, he had another seizure today 05/19/15: remains on vent after further seizures  Active Problems:   Status epilepticus (HCC)   Seizures (HCC)   Acute respiratory failure (HCC)   Coagulase negative Staphylococcus bacteremia   Bipolar I disorder, most recent episode depressed  (HCC)   Suicidal ideation   Homicidal ideation   Impulse control disease   Acute encephalopathy   Encounter for orogastric (OG) tube placement    Chase Hampton is a 25 y.o. male with  Depression, ? Bipolar disorder recent homicidal ideation, suicidal ideation who had several health inflicted head injuries while in police custody in jail, was refusing to take his medications including Depakote while in jail and then was found to have had a seizure brought to the hospital and ultimately intubated and transferred to [facility] cone. His admission blood cultures at [facility] were positive for coagulase-negative Staphylococcus   #1 Coagulase-negative Staphylococcus bacteremia:  His TEE was negative and LP benign so he does not have endocarditis or CNS involvement (and MRI brain was normal as wel)  Organism is S to OX. It is NOT a CONTAMINANT given in 2/2 blood cultures but is a genuine bacteremia  I wonder if there is IVDU in his history or if it came in from skin portal  IF when he wakes up from the seizures and sedation the primary team finds evidence of a deep infection such as osteomyeltis THEN please call us back to further investigate  Otherwise I would simply give him 2 weeks of effective IV antibiotics vs his MS-COag Neg Staph which would be thru March 5th       LOS: 4  days   Acey Lav 05/19/2015, 3:08 PM

## 2015-05-19 NOTE — Progress Notes (Signed)
Subjective: No seizures since yesterday. Mentation felt to be somewhat worse than yesterday per nursing.   Objective: Current vital signs: BP 108/69 mmHg  Pulse 106  Temp(Src) 100.2 F (37.9 C) (Axillary)  Resp 18  Ht  (1.803 m)  Wt 121.2 kg (267 lb 3.2 oz)  BMI 37.28 kg/m2  SpO2 94% Vital signs in last 24 hours: Temp:  [97.7 F (36.5 C)-100.8 F (38.2 C)] 100.2 F (37.9 C) (02/23 1120) Pulse Rate:  [99-121] 106 (02/23 1200) Resp:  [15-24] 18 (02/23 1200) BP: (104-123)/(68-81) 108/69 mmHg (02/23 1200) SpO2:  [92 %-100 %] 94 % (02/23 1200) FiO2 (%):  [30 %] 30 % (02/23 0906) Weight:  [121.2 kg (267 lb 3.2 oz)] 121.2 kg (267 lb 3.2 oz) (02/23 0337)  Intake/Output from previous day: 02/22 0701 - 02/23 0700 In: 1860 [I.V.:240; NG/GT:1140; IV Piggyback:480] Out: 1164 [Urine:1164] Intake/Output this shift: Total I/O In: 650 [I.V.:60; NG/GT:390; IV Piggyback:200] Out: 575 [Urine:575] Nutritional status: Diet NPO time specified  Neurologic Exam: Ment: Somnolent to lethargic. Opens eyes partially to command. Mouths "four" when asked to count five fingers. CN: PERRL, fixates on visual stimulus briefly, able to count fingers, face symmetric. Intubated. Motor: Waxy rigidity is not significantly changed since yesterday.  Reflexes: Hypoactive in the context of rigidity.   Lab Results: Basic Metabolic Panel:  Recent Labs Lab 05/15/15 1113 05/16/15 0331 05/17/15 0413 05/18/15 0420  NA 148* 146* 145 143  K 4.5 3.8 3.9 3.9  CL 108 112* 106 106  CO2 GLUCOSE 107* 115* 115* 117*  BUN 34* CREATININE 1.06 0.86 0.95 0.76  CALCIUM 9.8 8.7* 8.6* 8.5*  MG  --  2.2  --   --   PHOS  --  3.2  --   --     Liver Function Tests:  Recent Labs Lab 05/15/15 1113 05/18/15 0420  AST 184* 59*  ALT 92* 58  ALKPHOS 65 45  BILITOT 1.5* 0.8  PROT 7.4 5.0*  ALBUMIN 3.9 2.2*   No results for input(s): LIPASE, AMYLASE in the last 168 hours. No results for  input(s): AMMONIA in the last 168 hours.  CBC:  Recent Labs Lab 05/15/15 1113 05/15/15 1751 05/16/15 0331 05/17/15 0413 05/18/15 0420  WBC 16.7* 13.6* 10.6* 10.4 8.8  NEUTROABS 14.1*  --   --   --   --   HGB 16.6 14.2 13.5 13.1 12.4*  HCT 49.3 43.7 41.4 39.7 38.1*  MCV 84.0 87.8 88.3 87.3 87.0  PLT 123* 86* 82* 66* 75*    Cardiac Enzymes:  Recent Labs Lab 05/15/15 1113 05/18/15 0420  CKTOTAL 7546* 1235*  TROPONINI 0.05*  --     Lipid Panel:  Recent Labs Lab 05/17/15 0413  TRIG 101    CBG:  Recent Labs Lab 05/18/15 2003 05/18/15 2339 05/19/15 0307 05/19/15 0734 05/19/15 1118  GLUCAP 96 107* 94 114* 95    Microbiology: Results for orders placed or performed during the hospital encounter of 05/15/15  MRSA PCR Screening     Status: None   Collection Time: 05/15/15  3:49 PM  Result Value Ref Range Status   MRSA by PCR NEGATIVE NEGATIVE Final    Comment:        The GeneXpert MRSA Assay (FDA approved for NASAL specimens only), is one component of a comprehensive MRSA colonization surveillance program. It is not intended to diagnose MRSA infection nor to guide or monitor treatment for MRSA infections.  Culture, blood (routine x 2)     Status: None (Preliminary result)   Collection Time: 05/15/15  9:53 PM  Result Value Ref Range Status   Specimen Description BLOOD RIGHT ANTECUBITAL  Final   Special Requests BOTTLES DRAWN AEROBIC ONLY 5CC  Final   Culture NO GROWTH 3 DAYS  Final   Report Status PENDING  Incomplete  Culture, blood (routine x 2)     Status: None (Preliminary result)   Collection Time: 05/15/15 10:06 PM  Result Value Ref Range Status   Specimen Description BLOOD RIGHT ANTECUBITAL  Final   Special Requests BOTTLES DRAWN AEROBIC ONLY 5CC  Final   Culture NO GROWTH 3 DAYS  Final   Report Status PENDING  Incomplete  CSF culture     Status: None (Preliminary result)   Collection Time: 05/17/15  3:01 PM  Result Value Ref Range Status    Specimen Description CSF  Final   Special Requests NONE  Final   Gram Stain   Final    CYTOSPIN SMEAR WBC PRESENT, PREDOMINANTLY MONONUCLEAR NO ORGANISMS SEEN    Culture NO GROWTH 2 DAYS  Final   Report Status PENDING  Incomplete    Coagulation Studies: No results for input(s): LABPROT, INR in the last 72 hours.  Imaging: Dg Abd Portable 1v  05/17/2015  CLINICAL DATA:  Orogastric tube placement EXAM: PORTABLE ABDOMEN - 1 VIEW COMPARISON:  Abdominal CT 05/15/2015 FINDINGS: Orogastric tube tip at pyloric region. Normal bowel gas pattern. Atelectatic type opacities at the left base, as seen on CT 05/15/2015 IMPRESSION: Orogastric tube tip at the pylorus. Electronically Signed   By: Marnee Spring M.D.   On: 05/17/2015 17:06   Dg Fluoro Guide Lumbar Puncture  05/17/2015  CLINICAL DATA:  Altered mental status, concern for meningitis. EXAM: DIAGNOSTIC LUMBAR PUNCTURE UNDER FLUOROSCOPIC GUIDANCE FLUOROSCOPY TIME:  Radiation Exposure Index (as provided by the fluoroscopic device): If the device does not provide the exposure index: Fluoroscopy Time (in minutes and seconds):  1 minutes 30 seconds Number of Acquired Images:  1 PROCEDURE: Intubated patient. Informed consent was obtained from the patient's parents to the procedure, including potential complications of headache, allergy, and pain. With the patient prone, the lower back was prepped with Betadine. 1% Lidocaine was used for local anesthesia. Lumbar puncture was performed at the L3-L4 level using a 20 gauge needle with return of clear CSF with an opening pressure of 21 cm water. Nine ml of CSF were obtained for laboratory studies. The patient tolerated the procedure well and there were no apparent complications. IMPRESSION: Successful lumbar puncture. Electronically Signed   By: Genevive Bi M.D.   On: 05/17/2015 15:38    Medications: I have reviewed the patient's current medications.  Assessment/Plan: 1. Encephalopathy with waxy  rigidity. Most consistent with catatonia. Stable neurological exam relative to yesterday. DDx unchanged, including neuroleptic malignant syndrome, toxic encephalopathy, catatonia secondary to surreptitious/undocumented neuroleptic use or withdrawal from undocumented or surreptitious use of a dopaminergic compound. Overall presentation would be atypical for benzodiazepine withdrawal. MRI study obtained 2/20 revealed stable and normal MRI appearance of the brain. CSF results showed normal glucose, elevated protein of 60, 22 RBC without xanthochromia and 2 WBC. Cryptococcal antigen was negative. HIV testing negative.  2. Patient has indicated that he was trying to discontinue one of his home medications. He indicated "no" when asked if he was withdrawing from the following: EtOH, amphetamine, cocaine. 3. Coagulase negative staphylococcus bacteremia. Per ID, his TEE was negative and since LP  was also benign he does not have endocarditis or CNS involvement. Improving white count.  4. Most recent EEG showed no focal, hemispheric, or lateralizing features and was within normal limits for a sedated EEG.Most of the recording was spent in stage II sleep architecture.There was very little waking time on the recording, but during the brief period in which he was stimulated, the occipital dominant rhythm was normal. There was no epileptiform activity recorded.  Recommendations: 1. Continue Keppra, Depakote and Klonopin.  2. Continue PRN Ativan.  3. Currently on cefazolin.  4. CIWA protocol.  5. DVT prophylaxis with SCDs.  6. 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 Dosing is 2.5 mg through nasogastric tube q6-8h and titrated up to a maximum dose of 40 mg/day. This can be continued for 10 days after NMS is controlled and then tapered slowly.  7. Pharmacy or toxicology consult to determine if any of the meds he was taking at home had enough of a  dopaminergic effect to result in catatonia if it were to be abruptly discontinued.  8. Repeat EEG today.  9. Agree with obtaining full spine MRI to rule out epidural abscess if coagulase negative staph regrows.   Caryl Pina, MD 05/19/2015, 1:00 PM

## 2015-05-20 ENCOUNTER — Inpatient Hospital Stay (HOSPITAL_COMMUNITY): Payer: BLUE CROSS/BLUE SHIELD

## 2015-05-20 LAB — CULTURE, BLOOD (ROUTINE X 2)
CULTURE: NO GROWTH
Culture: NO GROWTH

## 2015-05-20 LAB — CBC WITH DIFFERENTIAL/PLATELET
BASOS ABS: 0 10*3/uL (ref 0.0–0.1)
BASOS PCT: 0 %
EOS PCT: 3 %
Eosinophils Absolute: 0.2 10*3/uL (ref 0.0–0.7)
HCT: 38.6 % — ABNORMAL LOW (ref 39.0–52.0)
Hemoglobin: 12.9 g/dL — ABNORMAL LOW (ref 13.0–17.0)
Lymphocytes Relative: 22 %
Lymphs Abs: 1.7 10*3/uL (ref 0.7–4.0)
MCH: 29.7 pg (ref 26.0–34.0)
MCHC: 33.4 g/dL (ref 30.0–36.0)
MCV: 88.7 fL (ref 78.0–100.0)
MONO ABS: 1.2 10*3/uL — AB (ref 0.1–1.0)
Monocytes Relative: 15 %
Neutro Abs: 4.8 10*3/uL (ref 1.7–7.7)
Neutrophils Relative %: 60 %
PLATELETS: 96 10*3/uL — AB (ref 150–400)
RBC: 4.35 MIL/uL (ref 4.22–5.81)
RDW: 14 % (ref 11.5–15.5)
WBC: 8 10*3/uL (ref 4.0–10.5)

## 2015-05-20 LAB — CSF CULTURE

## 2015-05-20 LAB — CSF CULTURE W GRAM STAIN: Culture: NO GROWTH

## 2015-05-20 LAB — COMPREHENSIVE METABOLIC PANEL
ALBUMIN: 2.2 g/dL — AB (ref 3.5–5.0)
ALK PHOS: 51 U/L (ref 38–126)
ALT: 62 U/L (ref 17–63)
ANION GAP: 9 (ref 5–15)
AST: 55 U/L — AB (ref 15–41)
BILIRUBIN TOTAL: 0.6 mg/dL (ref 0.3–1.2)
BUN: 26 mg/dL — AB (ref 6–20)
CALCIUM: 8.5 mg/dL — AB (ref 8.9–10.3)
CO2: 26 mmol/L (ref 22–32)
Chloride: 108 mmol/L (ref 101–111)
Creatinine, Ser: 0.57 mg/dL — ABNORMAL LOW (ref 0.61–1.24)
GFR calc Af Amer: >60 mL/min (ref >60)
GLUCOSE: 114 mg/dL — AB (ref 65–99)
Potassium: 3.7 mmol/L (ref 3.5–5.1)
Sodium: 143 mmol/L (ref 135–145)
TOTAL PROTEIN: 5.2 g/dL — AB (ref 6.5–8.1)

## 2015-05-20 LAB — GLUCOSE, CAPILLARY
GLUCOSE-CAPILLARY: 101 mg/dL — AB (ref 65–99)
GLUCOSE-CAPILLARY: 123 mg/dL — AB (ref 65–99)
GLUCOSE-CAPILLARY: 123 mg/dL — AB (ref 65–99)
Glucose-Capillary: 105 mg/dL — ABNORMAL HIGH (ref 65–99)
Glucose-Capillary: 108 mg/dL — ABNORMAL HIGH (ref 65–99)
Glucose-Capillary: 96 mg/dL (ref 65–99)
Glucose-Capillary: 97 mg/dL (ref 65–99)

## 2015-05-20 LAB — TRIGLYCERIDES: Triglycerides: 91 mg/dL (ref <150)

## 2015-05-20 MED ORDER — CEFAZOLIN SODIUM-DEXTROSE 2-3 GM-% IV SOLR
2.0000 g | Freq: Three times a day (TID) | INTRAVENOUS | Status: AC
  Administered 2015-05-20 – 2015-05-29 (×29): 2 g via INTRAVENOUS
  Filled 2015-05-20 (×30): qty 50

## 2015-05-20 MED ORDER — SODIUM CHLORIDE 0.9 % IV SOLN
1500.0000 mg | Freq: Two times a day (BID) | INTRAVENOUS | Status: DC
  Administered 2015-05-21 – 2015-05-23 (×6): 1500 mg via INTRAVENOUS
  Filled 2015-05-20 (×9): qty 15

## 2015-05-20 MED ORDER — LORAZEPAM 2 MG/ML IJ SOLN
INTRAMUSCULAR | Status: AC
  Filled 2015-05-20: qty 2

## 2015-05-20 MED ORDER — LORAZEPAM 2 MG/ML IJ SOLN
3.0000 mg | Freq: Once | INTRAMUSCULAR | Status: AC
  Administered 2015-05-20: 3 mg via INTRAVENOUS

## 2015-05-20 MED ORDER — SODIUM CHLORIDE 0.9 % IV SOLN
1000.0000 mg | Freq: Once | INTRAVENOUS | Status: AC
  Administered 2015-05-20: 1000 mg via INTRAVENOUS
  Filled 2015-05-20 (×2): qty 10

## 2015-05-20 MED ORDER — LACTULOSE 10 GM/15ML PO SOLN
20.0000 g | Freq: Every day | ORAL | Status: DC
  Administered 2015-05-20 – 2015-05-23 (×3): 20 g via ORAL
  Filled 2015-05-20 (×3): qty 30

## 2015-05-20 MED ORDER — FUROSEMIDE 10 MG/ML IJ SOLN
40.0000 mg | Freq: Two times a day (BID) | INTRAMUSCULAR | Status: AC
  Administered 2015-05-20 – 2015-05-21 (×4): 40 mg via INTRAVENOUS
  Filled 2015-05-20 (×4): qty 4

## 2015-05-20 MED ORDER — CEFAZOLIN (ANCEF) 1 G IV SOLR
2.0000 g | Freq: Three times a day (TID) | INTRAVENOUS | Status: DC
  Filled 2015-05-20: qty 2

## 2015-05-20 NOTE — Progress Notes (Signed)
Subjective: Slightly improved responsiveness to commands earlier today. This is a relatively subtle improvement.   Objective: Current vital signs: BP 117/72 mmHg  Pulse 102  Temp(Src) 97.2 F (36.2 C) (Oral)  Resp 16  Ht  (1.803 m)  Wt 122.3 kg (269 lb 10 oz)  BMI 37.62 kg/m2  SpO2 99% Vital signs in last 24 hours: Temp:  [97.2 F (36.2 C)-100.5 F (38.1 C)] 97.2 F (36.2 C) (02/24 1600) Pulse Rate:  [85-104] 102 (02/24 1559) Resp:  [15-19] 16 (02/24 1559) BP: (106-132)/(70-94) 117/72 mmHg (02/24 1559) SpO2:  [94 %-100 %] 99 % (02/24 1559) FiO2 (%):  [30 %] 30 % (02/24 1559) Weight:  [122.3 kg (269 lb 10 oz)] 122.3 kg (269 lb 10 oz) (02/24 0453)  Intake/Output from previous day: 02/23 0701 - 02/24 0700 In: 2055 [I.V.:250; NG/GT:1340; IV Piggyback:465] Out: 1825 [Urine:1825] Intake/Output this shift:   Nutritional status: Diet NPO time specified  Neurologic Exam: Ment: Catatonic. Opens eyes partially to command. Squeezes examiner's hand more readily and forcefully to command than on prior exam. Nods head weakly when asked if he can hear me.  CN: PERRL, pupils relatively large at baseline today, face symmetric. Intubated. Motor: Waxy rigidity is not significantly changed since yesterday.   Lab Results: Basic Metabolic Panel:  Recent Labs Lab 05/15/15 1113 05/16/15 0331 05/17/15 0413 05/18/15 0420 05/20/15 0508  NA 148* 146* 145 143 143  K 4.5 3.8 3.9 3.9 3.7  CL 108 112* 106 106 108  CO2 GLUCOSE 107* 115* 115* 117* 114*  BUN 34* 26*  CREATININE 1.06 0.86 0.95 0.76 0.57*  CALCIUM 9.8 8.7* 8.6* 8.5* 8.5*  MG  --  2.2  --   --   --   PHOS  --  3.2  --   --   --     Liver Function Tests:  Recent Labs Lab 05/15/15 1113 05/18/15 0420 05/20/15 0508  AST 184* 59* 55*  ALT 92* 58 62  ALKPHOS 65 45 51  BILITOT 1.5* 0.8 0.6  PROT 7.4 5.0* 5.2*  ALBUMIN 3.9 2.2* 2.2*   No results for input(s): LIPASE, AMYLASE in the last 168  hours. No results for input(s): AMMONIA in the last 168 hours.  CBC:  Recent Labs Lab 05/15/15 1113 05/15/15 1751 05/16/15 0331 05/17/15 0413 05/18/15 0420 05/20/15 0508  WBC 16.7* 13.6* 10.6* 10.4 8.8 8.0  NEUTROABS 14.1*  --   --   --   --  4.8  HGB 16.6 14.2 13.5 13.1 12.4* 12.9*  HCT 49.3 43.7 41.4 39.7 38.1* 38.6*  MCV 84.0 87.8 88.3 87.3 87.0 88.7  PLT 123* 86* 82* 66* 75* 96*    Cardiac Enzymes:  Recent Labs Lab 05/15/15 1113 05/18/15 0420  CKTOTAL 7546* 1235*  TROPONINI 0.05*  --     Lipid Panel:  Recent Labs Lab 05/17/15 0413 05/20/15 0508  TRIG 101 91    CBG:  Recent Labs Lab 05/19/15 2330 05/20/15 0328 05/20/15 0743 05/20/15 1124 05/20/15 1539  GLUCAP 123* 123* 101* 108* 96    Microbiology: Results for orders placed or performed during the hospital encounter of 05/15/15  MRSA PCR Screening     Status: None   Collection Time: 05/15/15  3:49 PM  Result Value Ref Range Status   MRSA by PCR NEGATIVE NEGATIVE Final    Comment:        The GeneXpert MRSA Assay (FDA approved for NASAL specimens only),  is one component of a comprehensive MRSA colonization surveillance program. It is not intended to diagnose MRSA infection nor to guide or monitor treatment for MRSA infections.   Culture, blood (routine x 2)     Status: None   Collection Time: 05/15/15  9:53 PM  Result Value Ref Range Status   Specimen Description BLOOD RIGHT ANTECUBITAL  Final   Special Requests BOTTLES DRAWN AEROBIC ONLY 5CC  Final   Culture NO GROWTH 5 DAYS  Final   Report Status 05/20/2015 FINAL  Final  Culture, blood (routine x 2)     Status: None   Collection Time: 05/15/15 10:06 PM  Result Value Ref Range Status   Specimen Description BLOOD RIGHT ANTECUBITAL  Final   Special Requests BOTTLES DRAWN AEROBIC ONLY 5CC  Final   Culture NO GROWTH 5 DAYS  Final   Report Status 05/20/2015 FINAL  Final  CSF culture     Status: None   Collection Time: 05/17/15  3:01 PM   Result Value Ref Range Status   Specimen Description CSF  Final   Special Requests NONE  Final   Gram Stain   Final    CYTOSPIN SMEAR WBC PRESENT, PREDOMINANTLY MONONUCLEAR NO ORGANISMS SEEN    Culture NO GROWTH 3 DAYS  Final   Report Status 05/20/2015 FINAL  Final  Culture, blood (Routine X 2) w Reflex to ID Panel     Status: None (Preliminary result)   Collection Time: 05/18/15 10:10 AM  Result Value Ref Range Status   Specimen Description BLOOD RIGHT HAND  Final   Special Requests IN PEDIATRIC BOTTLE 3CC  Final   Culture NO GROWTH 2 DAYS  Final   Report Status PENDING  Incomplete  Culture, blood (Routine X 2) w Reflex to ID Panel     Status: None (Preliminary result)   Collection Time: 05/18/15 10:15 AM  Result Value Ref Range Status   Specimen Description BLOOD LEFT HAND  Final   Special Requests IN PEDIATRIC BOTTLE 3CC  Final   Culture NO GROWTH 2 DAYS  Final   Report Status PENDING  Incomplete    Coagulation Studies: No results for input(s): LABPROT, INR in the last 72 hours.  Imaging: Dg Chest Port 1 View  05/20/2015  CLINICAL DATA:  Seizure. EXAM: PORTABLE CHEST 1 VIEW COMPARISON:  05/17/2015. FINDINGS: Endotracheal tube, NG tube in stable position. Low lung volumes with progressive basilar atelectasis and/or infiltrates. Small left pleural effusion. No pneumothorax. Heart size stable No pulmonary venous congestion. No pneumothorax. IMPRESSION: 1. Lines and tubes in stable position. 2. Low lung volumes with progressive bibasilar atelectasis and/or infiltrates. Small left pleural effusion. Electronically Signed   By: Maisie Fus  Register   On: 05/20/2015 08:00    Medications: Medication list reviewed.   Assessment/Plan: 1. Encephalopathy with waxy rigidity. Most consistent with catatonia. Stable neurological exam relative to yesterday. 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 DDx also includes neuroleptic malignant  syndrome and toxic encephalopathy. Overall presentation would be atypical for benzodiazepine withdrawal. MRI study obtained 2/20 revealed stable and normal MRI appearance of the brain. CSF results showed normal glucose, elevated protein of 60, 22 RBC without xanthochromia and 2 WBC. Cryptococcal antigen was negative. HIV testing negative.  2. Patient has indicated that he was trying to discontinue one of his home medications. He indicated "no" when asked if he was withdrawing from the following: EtOH, amphetamine, cocaine. 3. Coagulase negative staphylococcus bacteremia. Per ID, his  TEE was negative and since LP was also benign he does not have endocarditis or CNS involvement. Improving white count.  4. Most recent EEG showed no focal, hemispheric, or lateralizing features and was within normal limits for a sedated EEG.Most of the recording was spent in stage II sleep architecture.There was very little waking time on the recording, but during the brief period in which he was stimulated, the occipital dominant rhythm was normal. There was no epileptiform activity recorded.  Recommendations: 1. Continue Keppra, Depakote and Klonopin.  2. Continue PRN Ativan. Requiring 2-5 mg IV for occasional seizure-like episodes, occurring approximately once per day over the last several days. Unclear if these are seizures. Will obtain a 24 hour EEG to capture an episode and determine if it is epileptic.  3. Currently on cefazolin.  4. CIWA protocol.  5. DVT prophylaxis with SCDs.  6. 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 Dosing is 2.5 mg through nasogastric tube q6-8h and titrated up to a maximum dose of 40 mg/day. This can be continued for 10 days after NMS is controlled and then tapered slowly.  7. Pharmacy or toxicology consult to determine if any of the meds he was taking at home had enough of a dopaminergic effect to result in catatonia if  it were to be abruptly discontinued.  8. Despite normal WBC on LP, would still obtain full spine MRI to rule out epidural abscess or other lesion.   Caryl Pina, MD 05/20/2015, 5:43 PM

## 2015-05-20 NOTE — Progress Notes (Signed)
Literature search to inform Neurological DDx:  Literature search performed to further investigate the possibility of catatonia in this patient. He may have initially manifested an agitated catat[facility] at the [facility] (head banging, aggressive behavior) triggered by his recent break up - individuals who have been rejected by a mate may often exhibit clinical features similar if not identical to major depression, and unipolar depression has a well-known association with the development of catatonia (agitated or otherwise) in a subset of such patients. Initial agitated catatonia may then have evolved to a state of retarded catatonia followed by malignant catatonia, which has clinical features that overlap with NMS. 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   Per UpToDate, there are 3 general types of catatonic state: "Retarded catatonia - The retarded form of catatonia is characterized by mutism, inhibited movement, posturing, negativism, and staring [1,10]. Postures may be mundane (eg, sitting or standing in the same position for hours) or unusual (eg, head raised above the bed as if on a pillow). Response to voice and noxious stimuli is decreased. Although speech and spontaneous movements are reduced, some patients appear to be alert and aware of the environment. In more severe cases, eating and drinking may cease and stupor and incontinence may occur [34].  Excited catatonia - Excited catatonia is characterized by excessive and purposeless motor activity in both the upper and lower limbs (hyperkinesis), restlessness, stereotypy, impulsivity, frenzy, and combativeness [1,10]. Delirium may occur in severe cases, and the excess motor activity may cause self-injury or harm to others.  Malignant catatonia - Malignant catatonia (also called lethal catatonia) is a life-threatening condition that  is characterized by fever (less likely in older patients), autonomic instability (labile or elevated blood pressure, tachycardia, tachypnea, and diaphoresis), delirium, and rigidity [1,16]. The syndrome is typically fulminant and progresses rapidly within a few days [35]. Common but nonspecific laboratory findings include leukocytosis, elevated creatine kinase, and low serum iron [16,36]. Signs of malignant catatonia overlap with signs of the neuroleptic malignant syndrome (NMS)."  Treatment options:  Per UpToDate: "Lorazepam challenge - Nearly every catatonic patient is treated initially with lorazepam or another benzodiazepine (patients with malignant catatonia may be quickly switched to electroconvulsive therapy); the response to the first one or two doses can help make the diagnosis of catatonia. Case series suggest using an intravenous (IV) bolus of lorazepam 1 to 2 mg. IV administration allows for rapid, complete absorption, although intramuscular (IM) injections and oral administration have also been used. Intravenous benzodiazepines should be injected or infused slowly. In addition, clinicians should be cognizant of concomitant drugs that can cause sedation or respiratory depression, eg, opioids. Benzodiazepines cause varying degrees of motor incoordination and increase the risk of falls".  Partial, temporary relief of signs 5 to 10 minutes after IV administration of lorazepam is consistent with a diagnosis of catatonia. If there is no change and the patient remains awake with stable vital signs, a second dose is given, and the patient is examined within 5 to 10 minutes, although observation can continue for longer periods. A negative response does not rule out catatonia, and occurs in approximately 20 percent or more of catatonic patients.  Recommendations:  Consider additional IV lorazepam challenge and observe closely for improvements in motor exam and responsiveness to commands. Perform exam 10  minutes prior to Lorazepam and 10 minutes following Lorazepam. I can assist with examination pre/post Lorazepam challenge if necessary.   Caryl Pina,  MD

## 2015-05-20 NOTE — Progress Notes (Addendum)
PULMONARY / CRITICAL CARE MEDICINE   Name: OHM DENTLER MRN: [id number] DOB: [birth year]    ADMISSION DATE:  05/15/2015 CONSULTATION DATE:  05/15/15  REFERRING MD:  Dr. Normand Sloop   CHIEF COMPLAINT:  Seizure, Acute Respiratory Failure   HISTORY OF PRESENT ILLNESS:  25 y/o M with PMH of GERD, anxiety and depression (prior [facility] hospitalizations) with recent admission to [facility] in [place] from 2/3-2/8 for major depressive disorder with homicidal / suicidal ideations after a recent break up with his fiancee.  Due to restraining order violations, he was transferred from [facility] to [facility] jail were he has been on suicide watch since arrival.  The patient reportedly refused to eat, drink or take medications.  He was reportedly seen lying in the floor and would urinate on himself.  After multiple episodes of urinating on himself, he as asked to mop up the floor which he was able to perform.  Prior to discharge from [facility], notes reflect he had multiple episodes of physically aggressive behavior - including striking his own head on the seclusion room walls.  At discharge he was to continue on Cymbalta, Depakote and Saphris.   Prison staff report the patient refused all medications after arrival.    On 2/19, the patient was noted to have a seizure in jail.  He was transported to [facility] ER via EMS.  It is documented that he arrived to the ER with urine and feces all over him.  There is question if patient was weaning off ativan?? Per [facility] notes but discharge note from [facility] does not include Rx for ativan. Room air saturations were 82%, he was treated with 100% NRB.  He was treated with 4mg  IV versed per EMS in route.  The patient had a second seizure in ER and was ultimately intubated.  He was transferred to [facility] ICU for further evaluation.    The patient arrived on propofol, mechanically intubated.    SUBJECTIVE:  Seizure this am   VITAL SIGNS: BP 111/80 mmHg  Pulse 91  Temp(Src) 98.6 F (37 C) (Oral)  Resp 19  Ht  5\' 11"  (1.803 m)  Wt 122.3 kg (269 lb 10 oz)  BMI 37.62 kg/m2  SpO2 99%  HEMODYNAMICS:    VENTILATOR SETTINGS: Vent Mode:  [-] CPAP FiO2 (%):  [30 %] 30 % Set Rate:  [15 bmp] 15 bmp Vt Set:  [600 mL] 600 mL PEEP:  [5 cmH20] 5 cmH20 Pressure Support:  [5 cmH20] 5 cmH20 Plateau Pressure:  [18 cmH20-19 cmH20] 19 cmH20  INTAKE / OUTPUT: I/O last 3 completed shifts: In: 3195 [I.V.:360; NG/GT:1890; IV Piggyback:945] Out: 2369 [Urine:2369]  PHYSICAL EXAMINATION: General:  Well developed adult male in NAD on vent  Neuro: active seizure mouth this am , LOC, rigid left upper ext HEENT:  ETT Cardiovascular:  s1s2 rrt, no m/r/g Lungs:  CTA distant Abdomen:  Obese/soft, bsx4 active  Musculoskeletal:  No acute deformities  Skin:  Cool /dry, old bruising to left thigh  LABS:  BMET  Recent Labs Lab 05/17/15 0413 05/18/15 0420 05/20/15 0508  NA 145 143 143  K 3.9 3.9 3.7  CL 106 106 108  CO2 31 27 26   BUN 15 19 26*  CREATININE 0.95 0.76 0.57*  GLUCOSE 115* 117* 114*    Electrolytes  Recent Labs Lab 05/16/15 0331 05/17/15 0413 05/18/15 0420 05/20/15 0508  CALCIUM 8.7* 8.6* 8.5* 8.5*  MG 2.2  --   --   --   PHOS 3.2  --   --   --  CBC  Recent Labs Lab 05/17/15 0413 05/18/15 0420 05/20/15 0508  WBC 10.4 8.8 8.0  HGB 13.1 12.4* 12.9*  HCT 39.7 38.1* 38.6*  PLT 66* 75* 96*    Coag's  Recent Labs Lab 05/15/15 1618  APTT 34  INR 1.34    Sepsis Markers  Recent Labs Lab 05/15/15 1113 05/15/15 1420 05/15/15 1751  LATICACIDVEN 2.0 1.9  --   PROCALCITON  --   --  <0.10    ABG  Recent Labs Lab 05/15/15 1114  PHART 7.49*  PCO2ART 40  PO2ART 101    Liver Enzymes  Recent Labs Lab 05/15/15 1113 05/18/15 0420 05/20/15 0508  AST 184* 59* 55*  ALT 92* 58 62  ALKPHOS 65 45 51  BILITOT 1.5* 0.8 0.6  ALBUMIN 3.9 2.2* 2.2*    Cardiac Enzymes  Recent Labs Lab 05/15/15 1113  TROPONINI 0.05*    Glucose  Recent Labs Lab  05/19/15 1621 05/19/15 1937 05/19/15 2217 05/19/15 2330 05/20/15 0328 05/20/15 0743  GLUCAP 96 100* 111* 123* 123* 101*    Imaging Dg Chest Port 1 View  05/20/2015  CLINICAL DATA:  Seizure. EXAM: PORTABLE CHEST 1 VIEW COMPARISON:  05/17/2015. FINDINGS: Endotracheal tube, NG tube in stable position. Low lung volumes with progressive basilar atelectasis and/or infiltrates. Small left pleural effusion. No pneumothorax. Heart size stable No pulmonary venous congestion. No pneumothorax. IMPRESSION: 1. Lines and tubes in stable position. 2. Low lung volumes with progressive bibasilar atelectasis and/or infiltrates. Small left pleural effusion. Electronically Signed   By: Maisie Fus  Register   On: 05/20/2015 08:00    STUDIES:  LP 2/20 >>  MRI brain 2/20 >> cancelled  EEG 2/20 >>  No active seizures.  Repeat EEG>>>  CULTURES: LP 2/21 >>>  Reported coag neg staph BC, assume 19th BC 2/19>>>> BC 2/22>>>  ANTIBIOTICS: Rocephin 2gm 2/19 >>  Vanco 2/20 >>   SIGNIFICANT EVENTS: 2/19  Admit from jail with seizure, intubated  2/22- focal seizures mouth but LOS associated 2/23, 2/24- AM seziures  LINES/TUBES: ETT 2/19 >>   DISCUSSION: 25 y/o M with PMH of major depression with multiple PSY admissions admitted 2/19 with seizure (no hx). Intubated in ER and transferred to [facility] ICU.    ASSESSMENT / PLAN:  PULMONARY A: Acute Hypoxic Respiratory Failure - in setting of seizure, brief hypoxia, resolved  Hilar prominence int prom pcxr 2/24 P:   Neurostatus does NOT support extubation Wean cpap 5 ps 5, goal 2 hr, unable to remove ETT, dc wean with ativan 5 mg recieved Get pcxr in am  Consider even to neg balance  CARDIOVASCULAR A:  Hypotension - resolved R/o endocarditis vs contamination P:  ICU monitoring of hemodynamics  KVO TEE neg noted Repeat BC sent to follow - remain neg  RENAL A:   Rhabdomyolysis - admit CK >7500 POS balance P:   kvo Chem in  am Lasix  GASTROINTESTINAL A:   Obesity  constipation P:   TF to goal SUP while on MV Add additional agent for BM- lactulose  HEMATOLOGIC A:   Leukocytosis  Thrombocytopenia (lft wnl) P:  Trend CBC in am for plat scd Get hep panel -  neg  INFECTIOUS A:   R/o contamination coag neg staph NO fevers P:   Repeat BC neg, follow to final Feel somewhat obligated to maintain treatment coag neg staph for 14 days with ancef Id recs noted MRi back ? Will d/w ID  ENDOCRINE A:   Mild hyperglycemia  P:  Trend glucose on BMP   NEUROLOGIC A:   Seizure - suspect related to abrupt cessation of medications +/- benzo withdrawal.  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  FAMILY  - Updates: Police not allowing direct updates to family. Complying with police , allowing updated, I will talk to family 2/24  - Inter-disciplinary family meet or Palliative Care meeting due by: 2/26  Ccm time 30 min  Mcarthur Rossetti. Tyson Alias, MD, FACP Pgr: [phone] [facility]'

## 2015-05-20 NOTE — Progress Notes (Signed)
Patient placed back on PRVC with previous vent settings due to MD verbal order.

## 2015-05-21 ENCOUNTER — Inpatient Hospital Stay (HOSPITAL_COMMUNITY): Payer: BLUE CROSS/BLUE SHIELD

## 2015-05-21 ENCOUNTER — Encounter (HOSPITAL_COMMUNITY): Payer: Self-pay | Admitting: *Deleted

## 2015-05-21 DIAGNOSIS — F639 Impulse disorder, unspecified: Secondary | ICD-10-CM

## 2015-05-21 DIAGNOSIS — F3130 Bipolar disorder, current episode depressed, mild or moderate severity, unspecified: Secondary | ICD-10-CM

## 2015-05-21 LAB — BASIC METABOLIC PANEL
Anion gap: 12 (ref 5–15)
BUN: 30 mg/dL — AB (ref 6–20)
CO2: 29 mmol/L (ref 22–32)
CREATININE: 0.76 mg/dL (ref 0.61–1.24)
Calcium: 8.8 mg/dL — ABNORMAL LOW (ref 8.9–10.3)
Chloride: 105 mmol/L (ref 101–111)
Glucose, Bld: 131 mg/dL — ABNORMAL HIGH (ref 65–99)
Potassium: 3.7 mmol/L (ref 3.5–5.1)
SODIUM: 146 mmol/L — AB (ref 135–145)

## 2015-05-21 LAB — CBC WITH DIFFERENTIAL/PLATELET
BASOS ABS: 0 10*3/uL (ref 0.0–0.1)
BASOS PCT: 0 %
EOS ABS: 0.2 10*3/uL (ref 0.0–0.7)
EOS PCT: 2 %
HCT: 39.7 % (ref 39.0–52.0)
Hemoglobin: 12.9 g/dL — ABNORMAL LOW (ref 13.0–17.0)
Lymphocytes Relative: 22 %
Lymphs Abs: 1.9 10*3/uL (ref 0.7–4.0)
MCH: 28.2 pg (ref 26.0–34.0)
MCHC: 32.5 g/dL (ref 30.0–36.0)
MCV: 86.9 fL (ref 78.0–100.0)
Monocytes Absolute: 1 10*3/uL (ref 0.1–1.0)
Monocytes Relative: 12 %
Neutro Abs: 5.5 10*3/uL (ref 1.7–7.7)
Neutrophils Relative %: 64 %
PLATELETS: 123 10*3/uL — AB (ref 150–400)
RBC: 4.57 MIL/uL (ref 4.22–5.81)
RDW: 13.9 % (ref 11.5–15.5)
WBC: 8.6 10*3/uL (ref 4.0–10.5)

## 2015-05-21 LAB — PHOSPHORUS: PHOSPHORUS: 4.2 mg/dL (ref 2.5–4.6)

## 2015-05-21 LAB — GLUCOSE, CAPILLARY
GLUCOSE-CAPILLARY: 90 mg/dL (ref 65–99)
GLUCOSE-CAPILLARY: 101 mg/dL — AB (ref 65–99)
GLUCOSE-CAPILLARY: 110 mg/dL — AB (ref 65–99)
Glucose-Capillary: 122 mg/dL — ABNORMAL HIGH (ref 65–99)
Glucose-Capillary: 107 mg/dL — ABNORMAL HIGH (ref 65–99)

## 2015-05-21 LAB — MAGNESIUM: MAGNESIUM: 2.1 mg/dL (ref 1.7–2.4)

## 2015-05-21 NOTE — Consult Note (Signed)
[place] Psychiatry Consult   Reason for Consult:  Bipolar depression, noncompliant with medication while in detention which resulted possible benzodiazepine withdrawal and status  post catatonia. Referring Physician:  Dr. Titus Mould Patient Identification: Chase Hampton MRN:  [id number] Principal Diagnosis: <principal problem not specified> Diagnosis:   Patient Active Problem List   Diagnosis Date Noted  . Seizure ([place]) [R56.9]   . Encounter for orogastric (OG) tube placement [Z46.59]   . Acute respiratory failure ([place]) [J96.00]   . Coagulase negative Staphylococcus bacteremia [R78.81]   . Bipolar I disorder, most recent episode depressed ([name]) [F31.30]   . Suicidal ideation [R45.851]   . Homicidal ideation [R45.850]   . 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 Acute encephalopathy [G93.40]   . Seizures ([name]) [R56.9]   . Status epilepticus ([place]) [G40.901] 05/15/2015  . Major depressive disorder, recurrent episode, moderate ([name]) [F33.1] 04/30/2015    Total Time spent with patient: 1 hour  Subjective:   Chase Hampton is a 25 y.o. male patient admitted with status post seizures and depression.  HPI:   Chase Hampton is a 25 years old single male admitted to [facility] medical intensive care unit for status post benzodiazepine withdrawal seizures and increased symptoms of bipolar depression, anxiety, anger outbursts and bizarre behaviors. Reviewed neurology consultation and their follow-ups regarding seizures and possible catatonia. Patient is also has multiple legal charges regarding violation of restraining order and multiple acute psychiatric hospitalization since November 2016 for depression, anger outbursts, suicidal and homicidal ideation, intention and plans. Review of medical records indicated he was hospitalized at least 3 times at [facility] both voluntarily and involuntarily and also in [place]. Patient father reported that he has a severe  allergic reaction to Haldol given during the second hospitalization. Patient was initially brought to the [facility] from detention center and then transferred to [facility] for continuous EEG monitoring. Patient mother, father and sister were in the hospital and provided detailed history of deterioration of mental status and noncompliant with the medication management and withdrawal symptoms. Patient also has history of traumatic brain injury, concussion injury while playing football and shoulder injury while playing rugby.   Please review the following information for more details. Patient is a 25 y/o M with PMH of GERD, anxiety and depression (prior [facility] hospitalizations) with recent admission to [facility] in [place] from 2/3-2/8 for major depressive disorder with homicidal / suicidal ideations after a recent break up with his fiancee. Due to restraining order violations, he was transferred from [facility] to [facility] jail were he has been on suicide watch since arrival. The patient reportedly refused to eat, drink or take medications. He was reportedly seen lying in the floor and would urinate on himself. After multiple episodes of urinating on himself, he as asked to mop up the floor which he was able to perform. Prior to discharge from [facility], notes reflect he had multiple episodes of physically aggressive behavior - including striking his own head on the seclusion room walls. At discharge he was to continue on Cymbalta, Depakote and Saphris. Prison staff report the patient refused all medications after arrival.   On 2/19, the patient was noted to have a seizure in jail. He was transported to [facility] ER via EMS. It is documented that he arrived to the ER with urine and feces all over him. There is question if patient was weaning off ativan?? Per [facility] notes but discharge note from [facility] does not include Rx for ativan. Room air  saturations were 82%, he was treated with 100% NRB. He was treated with 68m IV versed per EMS  in route. The patient had a second seizure in ER and was ultimately intubated. [facility]The patient arrived on propofol, mechanically intubated.  Past Psychiatric History: no prior psychiatric history before Nov 2016 (no suicidal attempts, never prescribed with any psychotropics).   1st Hospitalization in our unit from 02/23/15 to 03/01/15. "presented to the emergency department voicing worsening depression, thoughts of suicide and some homicidal ideation towards his girlfriend. These worsening of symptoms was triggered after his girlfriend of 5 years broke up with him. Patient reports a long history of anger issues that started in childhood. The patient also has history of multiple concussions due to playing football. Per reports from the patient's family he was holding a gun and threatening to kill himself and her." He was diagnosed with depression and was started on fluoxetine.  2nd Hospitalization in our unit: 03/07/15 to 03/14/15: "presented to our ER on 184/13under police custody. According to report he got into an altercation with his father and then stated he was getting in his car to go kill his fiance and her family which lives very close by. Police were called. The patient was belligerent and violent." Diagnosed with MDD and d/c on fluoxetine and depakote for irritability and anger.  [place]: jump out of his parents car and tried to tie a cord around his neck. Per pt, prozac was increased to 40 mg. Depakote was d/c.  [facility] [place] and depakote. [facility]  Risk to Self:   Risk to Others:   Prior Inpatient Therapy:   Prior Outpatient Therapy:    Past Medical History:  Past Medical History  Diagnosis Date  . GERD (gastroesophageal reflux disease)   . Anxiety   .  Depression     Past Surgical History  Procedure Laterality Date  . No past surgeries     Family History:  Family History  Problem Relation Age of Onset  . Heart disease Maternal Grandfather   . Diabetes Maternal Grandfather   . Heart disease Paternal Grandmother    Family Psychiatric  History: Family history of depression and multiple family members at bedtime and set up the family.  Social History:  History  Alcohol Use No     History  Drug Use  . Yes  . Special: Marijuana    Social History   Social History  . Marital Status: Single    Spouse Name: N/A  . Number of Children: N/A  . Years of Education: N/A   Social History Main Topics  . Smoking status: Former Smoker    Types: Cigars  . Smokeless tobacco: Never Used  . Alcohol Use: No  . Drug Use: Yes    Special: Marijuana  . Sexual Activity: No   Other Topics Concern  . Not on file   Social History Narrative   Additional Social History:    Allergies:   Allergies  Allergen Reactions  . Haldol [Haloperidol Lactate] Other (See Comments)    [facility] reaction ([facility] no allergies as of 05/15/15)    Labs:  Results for orders placed or performed during the hospital encounter of 05/15/15 (from the past 48 hour(s))  Glucose, capillary     Status: None  Collection Time: 05/19/15 11:18 AM  Result Value Ref Range   Glucose-Capillary 95 65 - 99 mg/dL  Glucose, capillary     Status: None   Collection Time: 05/19/15  4:21 PM  Result Value Ref Range   Glucose-Capillary 96 65 - 99 mg/dL  Glucose, capillary     Status: Abnormal   Collection Time: 05/19/15  7:37 PM  Result Value Ref Range   Glucose-Capillary 100 (H) 65 - 99 mg/dL  Glucose, capillary     Status: Abnormal   Collection Time: 05/19/15 10:17 PM  Result Value Ref Range   Glucose-Capillary 111 (H) 65 - 99 mg/dL  Glucose, capillary     Status: Abnormal   Collection Time: 05/19/15 11:30 PM  Result Value Ref Range   Glucose-Capillary  123 (H) 65 - 99 mg/dL  Glucose, capillary     Status: Abnormal   Collection Time: 05/20/15  3:28 AM  Result Value Ref Range   Glucose-Capillary 123 (H) 65 - 99 mg/dL  Triglycerides     Status: None   Collection Time: 05/20/15  5:08 AM  Result Value Ref Range   Triglycerides 91 <150 mg/dL  Comprehensive metabolic panel     Status: Abnormal   Collection Time: 05/20/15  5:08 AM  Result Value Ref Range   Sodium 143 135 - 145 mmol/L   Potassium 3.7 3.5 - 5.1 mmol/L   Chloride 108 101 - 111 mmol/L   CO2 26 22 - 32 mmol/L   Glucose, Bld 114 (H) 65 - 99 mg/dL   BUN 26 (H) 6 - 20 mg/dL   Creatinine, Ser 0.57 (L) 0.61 - 1.24 mg/dL   Calcium 8.5 (L) 8.9 - 10.3 mg/dL   Total Protein 5.2 (L) 6.5 - 8.1 g/dL   Albumin 2.2 (L) 3.5 - 5.0 g/dL   AST 55 (H) 15 - 41 U/L   ALT 62 17 - 63 U/L   Alkaline Phosphatase 51 38 - 126 U/L   Total Bilirubin 0.6 0.3 - 1.2 mg/dL   GFR calc non Af Amer >60 >60 mL/min   GFR calc Af Amer >60 >60 mL/min    Comment: (NOTE) The eGFR has been calculated using the CKD EPI equation. This calculation has not been validated in all clinical situations. eGFR's persistently <60 mL/min signify possible Chronic Kidney Disease.    Anion gap 9 5 - 15  CBC with Differential/Platelet     Status: Abnormal   Collection Time: 05/20/15  5:08 AM  Result Value Ref Range   WBC 8.0 4.0 - 10.5 K/uL   RBC 4.35 4.22 - 5.81 MIL/uL   Hemoglobin 12.9 (L) 13.0 - 17.0 g/dL   HCT 38.6 (L) 39.0 - 52.0 %   MCV 88.7 78.0 - 100.0 fL   MCH 29.7 26.0 - 34.0 pg   MCHC 33.4 30.0 - 36.0 g/dL   RDW 14.0 11.5 - 15.5 %   Platelets 96 (L) 150 - 400 K/uL    Comment: CONSISTENT WITH PREVIOUS RESULT   Neutrophils Relative % 60 %   Neutro Abs 4.8 1.7 - 7.7 K/uL   Lymphocytes Relative 22 %   Lymphs Abs 1.7 0.7 - 4.0 K/uL   Monocytes Relative 15 %   Monocytes Absolute 1.2 (H) 0.1 - 1.0 K/uL   Eosinophils Relative 3 %   Eosinophils Absolute 0.2 0.0 - 0.7 K/uL   Basophils Relative 0 %   Basophils  Absolute 0.0 0.0 - 0.1 K/uL  Glucose, capillary     Status: Abnormal  Collection Time: 05/20/15  7:43 AM  Result Value Ref Range   Glucose-Capillary 101 (H) 65 - 99 mg/dL   Comment 1 Notify RN    Comment 2 Document in Chart   Glucose, capillary     Status: Abnormal   Collection Time: 05/20/15 11:24 AM  Result Value Ref Range   Glucose-Capillary 108 (H) 65 - 99 mg/dL  Glucose, capillary     Status: None   Collection Time: 05/20/15  3:39 PM  Result Value Ref Range   Glucose-Capillary 96 65 - 99 mg/dL   Comment 1 Notify RN    Comment 2 Document in Chart   Glucose, capillary     Status: None   Collection Time: 05/20/15  7:34 PM  Result Value Ref Range   Glucose-Capillary 97 65 - 99 mg/dL  Glucose, capillary     Status: Abnormal   Collection Time: 05/20/15 11:38 PM  Result Value Ref Range   Glucose-Capillary 105 (H) 65 - 99 mg/dL  Basic metabolic panel     Status: Abnormal   Collection Time: 05/21/15  3:46 AM  Result Value Ref Range   Sodium 146 (H) 135 - 145 mmol/L   Potassium 3.7 3.5 - 5.1 mmol/L   Chloride 105 101 - 111 mmol/L   CO2 29 22 - 32 mmol/L   Glucose, Bld 131 (H) 65 - 99 mg/dL   BUN 30 (H) 6 - 20 mg/dL   Creatinine, Ser 0.76 0.61 - 1.24 mg/dL   Calcium 8.8 (L) 8.9 - 10.3 mg/dL   GFR calc non Af Amer >60 >60 mL/min   GFR calc Af Amer >60 >60 mL/min    Comment: (NOTE) The eGFR has been calculated using the CKD EPI equation. This calculation has not been validated in all clinical situations. eGFR's persistently <60 mL/min signify possible Chronic Kidney Disease.    Anion gap 12 5 - 15  CBC with Differential/Platelet     Status: Abnormal   Collection Time: 05/21/15  3:46 AM  Result Value Ref Range   WBC 8.6 4.0 - 10.5 K/uL   RBC 4.57 4.22 - 5.81 MIL/uL   Hemoglobin 12.9 (L) 13.0 - 17.0 g/dL   HCT 39.7 39.0 - 52.0 %   MCV 86.9 78.0 - 100.0 fL   MCH 28.2 26.0 - 34.0 pg   MCHC 32.5 30.0 - 36.0 g/dL   RDW 13.9 11.5 - 15.5 %   Platelets 123 (L) 150 - 400 K/uL    Neutrophils Relative % 64 %   Neutro Abs 5.5 1.7 - 7.7 K/uL   Lymphocytes Relative 22 %   Lymphs Abs 1.9 0.7 - 4.0 K/uL   Monocytes Relative 12 %   Monocytes Absolute 1.0 0.1 - 1.0 K/uL   Eosinophils Relative 2 %   Eosinophils Absolute 0.2 0.0 - 0.7 K/uL   Basophils Relative 0 %   Basophils Absolute 0.0 0.0 - 0.1 K/uL  Phosphorus     Status: None   Collection Time: 05/21/15  3:46 AM  Result Value Ref Range   Phosphorus 4.2 2.5 - 4.6 mg/dL  Magnesium     Status: None   Collection Time: 05/21/15  3:46 AM  Result Value Ref Range   Magnesium 2.1 1.7 - 2.4 mg/dL  Glucose, capillary     Status: Abnormal   Collection Time: 05/21/15  3:50 AM  Result Value Ref Range   Glucose-Capillary 122 (H) 65 - 99 mg/dL  Glucose, capillary     Status: Abnormal   Collection  Time: 05/21/15  7:38 AM  Result Value Ref Range   Glucose-Capillary 107 (H) 65 - 99 mg/dL    Current Facility-Administered Medications  Medication Dose Route Frequency Provider Last Rate Last Dose  . 0.9 %  sodium chloride infusion  250 mL Intravenous PRN Kara Mead V, MD      . 0.9 %  sodium chloride infusion   Intravenous Continuous Lelon Perla, MD      . acetaminophen (TYLENOL) tablet 650 mg  650 mg Oral Q4H PRN Rigoberto Noel, MD   650 mg at 05/18/15 0534  . antiseptic oral rinse solution (CORINZ)  7 mL Mouth Rinse QID Kara Mead V, MD   7 mL at 05/21/15 0355  . asenapine (SAPHRIS) sublingual tablet 5 mg  5 mg Sublingual QHS Donita Brooks, NP   5 mg at 05/20/15 2131  . ceFAZolin (ANCEF) IVPB 2 g/50 mL premix  2 g Intravenous Q8H Kara Mead V, MD   2 g at 05/21/15 0523  . chlorhexidine gluconate (PERIDEX) 0.12 % solution 15 mL  15 mL Mouth Rinse BID Kara Mead V, MD   15 mL at 05/21/15 0756  . clonazePAM (KLONOPIN) tablet 1 mg  1 mg Oral QHS Raylene Miyamoto, MD   1 mg at 05/20/15 2131  . dextrose 5 %-0.45 % sodium chloride infusion   Intravenous Continuous Collene Gobble, MD 10 mL/hr at 05/20/15 1900    .  DULoxetine (CYMBALTA) DR capsule 60 mg  60 mg Oral Daily Donita Brooks, NP   60 mg at 05/18/15 1144  . feeding supplement (PRO-STAT SUGAR FREE 64) liquid 60 mL  60 mL Per Tube BID Collene Gobble, MD   60 mL at 05/20/15 2132  . feeding supplement (VITAL HIGH PROTEIN) liquid 1,000 mL  1,000 mL Per Tube Continuous Collene Gobble, MD 50 mL/hr at 05/20/15 1900 1,000 mL at 05/20/15 1900  . fentaNYL (SUBLIMAZE) injection 100 mcg  100 mcg Intravenous Q15 min PRN Kara Mead V, MD      . fentaNYL (SUBLIMAZE) injection 100 mcg  100 mcg Intravenous Q2H PRN Kara Mead V, MD   100 mcg at 05/21/15 0255  . furosemide (LASIX) injection 40 mg  40 mg Intravenous Q12H Raylene Miyamoto, MD   40 mg at 05/20/15 2132  . lactulose (CHRONULAC) 10 GM/15ML solution 20 g  20 g Oral Daily Raylene Miyamoto, MD   20 g at 05/20/15 1339  . levETIRAcetam (KEPPRA) 1,500 mg in sodium chloride 0.9 % 100 mL IVPB  1,500 mg Intravenous Q12H Kerney Elbe, MD   1,500 mg at 05/21/15 0024  . LORazepam (ATIVAN) injection 1-2 mg  1-2 mg Intravenous Q4H PRN Rigoberto Noel, MD   2 mg at 05/20/15 2047  . neomycin-bacitracin-polymyxin (NEOSPORIN) ointment   Topical BID Kara Mead V, MD      . pantoprazole sodium (PROTONIX) 40 mg/20 mL oral suspension 40 mg  40 mg Per Tube Daily Kara Mead V, MD   40 mg at 05/20/15 1314  . propofol (DIPRIVAN) 1000 MG/100ML infusion  5-70 mcg/kg/min Intravenous Titrated Anders Simmonds, MD   Stopped at 05/16/15 [id number]  . valproate (DEPACON) 500 mg in dextrose 5 % 50 mL IVPB  500 mg Intravenous Q12H Marliss Coots, PA-C   500 mg at 05/20/15 2210    Musculoskeletal: Strength & Muscle Tone: decreased Gait & Station: unable to stand Patient leans: N/A  Psychiatric Specialty Exam: Review of Systems  Unable to perform ROS   Blood pressure 107/61, pulse 100, temperature 99.3 F (37.4 C), temperature source Axillary, resp. rate 17, height 5' 11" (1.803 m), weight 121.7 kg (268 lb 4.8 oz), SpO2 98 %.Body mass  index is 37.44 kg/(m^2).  General Appearance: NA  Eye Contact::  NA  Speech:  NA  Volume:  To be determined  Mood:  NA  Affect:  NA  Thought Process:  NA  Orientation:  NA  Thought Content:  NA  Suicidal Thoughts:  To be determined  Homicidal Thoughts:  To be determine  Memory:  NA  Judgement:  NA  Insight:  NA  Psychomotor Activity:  NA  Concentration:  NA  Recall:  NA  Fund of Knowledge:NA  Language: NA  Akathisia:  NA  Handed:  Right  AIMS (if indicated):     Assets:  Others:  TBA  ADL's:  Impaired  Cognition: Impaired,  Severe  Sleep:      Treatment Plan Summary: Patient admitted with bipolar depression, status post seizures, status post intubation and has multiple acute psychiatric hospitalization at [facility] and also [place]. Reportedly patient is noncompliant with medication management while being in detention center/Jail for breaking [address] which is a restraining order aims his ex fiance.   Patient has been under [place] custody due to violation of multiple restraining orders and also has both suicidal and homicidal threats in the past.    Disposition: Patient will be reevaluated been it is appropriate and medically stable for possibility of psychiatric inpatient stabilization if needed Supportive therapy provided about ongoing stressors.  Durward Parcel., MD 05/21/2015 8:46 AM

## 2015-05-21 NOTE — Progress Notes (Signed)
LTM EEG started 

## 2015-05-21 NOTE — Progress Notes (Signed)
Subjective: Continued subtle improvement in responsiveness to questions and commands. Had a seizure-like spell yesterday night.   Objective: Current vital signs: BP 113/69 mmHg  Pulse 103  Temp(Src) 99.3 F (37.4 C) (Axillary)  Resp 20  Ht 5\' 11"  (1.803 m)  Wt 121.7 kg (268 lb 4.8 oz)  BMI 37.44 kg/m2  SpO2 98% Vital signs in last 24 hours: Temp:  [97.2 F (36.2 C)-100.3 F (37.9 C)] 99.3 F (37.4 C) (02/25 0741) Pulse Rate:  [88-117] 103 (02/25 0900) Resp:  [15-22] 20 (02/25 0900) BP: (98-132)/(55-96) 113/69 mmHg (02/25 0900) SpO2:  [97 %-100 %] 98 % (02/25 0900) FiO2 (%):  [30 %] 30 % (02/25 0848) Weight:  [121.7 kg (268 lb 4.8 oz)] 121.7 kg (268 lb 4.8 oz) (02/25 0300)  Intake/Output from previous day: 02/24 0701 - 02/25 0700 In: 1835 [I.V.:230; NG/GT:1230; IV Piggyback:375] Out: 3895 [Urine:3895] Intake/Output this shift: Total I/O In: 120 [I.V.:20; NG/GT:100] Out: 175 [Urine:175] Nutritional status: Diet NPO time specified  Neurologic Exam: Slight improvement since yesterday:  Ment: Catatonic. Opens eyes partially to command. Nods and shakes head weakly in response to yes/no questions.  CN: PERRL, pupils relatively large at baseline, face symmetric. Intubated. Motor: Waxy rigidity in upper and lower extremities is not significantly changed since yesterday.   Lab Results: Basic Metabolic Panel:  Recent Labs Lab 05/16/15 0331 05/17/15 0413 05/18/15 0420 05/20/15 0508 05/21/15 0346  NA 146* 145 143 143 146*  K 3.8 3.9 3.9 3.7 3.7  CL 112* 106 106 108 105  CO2 28 31 27 26 29   GLUCOSE 115* 115* 117* 114* 131*  BUN 19 15 19  26* 30*  CREATININE 0.86 0.95 0.76 0.57* 0.76  CALCIUM 8.7* 8.6* 8.5* 8.5* 8.8*  MG 2.2  --   --   --  2.1  PHOS 3.2  --   --   --  4.2    Liver Function Tests:  Recent Labs Lab 05/15/15 1113 05/18/15 0420 05/20/15 0508  AST 184* 59* 55*  ALT 92* 58 62  ALKPHOS 65 45 51  BILITOT 1.5* 0.8 0.6  PROT 7.4 5.0* 5.2*  ALBUMIN  3.9 2.2* 2.2*   No results for input(s): LIPASE, AMYLASE in the last 168 hours. No results for input(s): AMMONIA in the last 168 hours.  CBC:  Recent Labs Lab 05/15/15 1113  05/16/15 0331 05/17/15 0413 05/18/15 0420 05/20/15 0508 05/21/15 0346  WBC 16.7*  < > 10.6* 10.4 8.8 8.0 8.6  NEUTROABS 14.1*  --   --   --   --  4.8 5.5  HGB 16.6  < > 13.5 13.1 12.4* 12.9* 12.9*  HCT 49.3  < > 41.4 39.7 38.1* 38.6* 39.7  MCV 84.0  < > 88.3 87.3 87.0 88.7 86.9  PLT 123*  < > 82* 66* 75* 96* 123*  < > = values in this interval not displayed.  Cardiac Enzymes:  Recent Labs Lab 05/15/15 1113 05/18/15 0420  CKTOTAL 7546* 1235*  TROPONINI 0.05*  --     Lipid Panel:  Recent Labs Lab 05/17/15 0413 05/20/15 0508  TRIG 101 91    CBG:  Recent Labs Lab 05/20/15 1539 05/20/15 1934 05/20/15 2338 05/21/15 0350 05/21/15 0738  GLUCAP 96 97 105* 122* 107*    Microbiology: Results for orders placed or performed during the hospital encounter of 05/15/15  MRSA PCR Screening     Status: None   Collection Time: 05/15/15  3:49 PM  Result Value Ref Range Status   MRSA  by PCR NEGATIVE NEGATIVE Final    Comment:        The GeneXpert MRSA Assay (FDA approved for NASAL specimens only), is one component of a comprehensive MRSA colonization surveillance program. It is not intended to diagnose MRSA infection nor to guide or monitor treatment for MRSA infections.   Culture, blood (routine x 2)     Status: None   Collection Time: 05/15/15  9:53 PM  Result Value Ref Range Status   Specimen Description BLOOD RIGHT ANTECUBITAL  Final   Special Requests BOTTLES DRAWN AEROBIC ONLY 5CC  Final   Culture NO GROWTH 5 DAYS  Final   Report Status 05/20/2015 FINAL  Final  Culture, blood (routine x 2)     Status: None   Collection Time: 05/15/15 10:06 PM  Result Value Ref Range Status   Specimen Description BLOOD RIGHT ANTECUBITAL  Final   Special Requests BOTTLES DRAWN AEROBIC ONLY 5CC   Final   Culture NO GROWTH 5 DAYS  Final   Report Status 05/20/2015 FINAL  Final  CSF culture     Status: None   Collection Time: 05/17/15  3:01 PM  Result Value Ref Range Status   Specimen Description CSF  Final   Special Requests NONE  Final   Gram Stain   Final    CYTOSPIN SMEAR WBC PRESENT, PREDOMINANTLY MONONUCLEAR NO ORGANISMS SEEN    Culture NO GROWTH 3 DAYS  Final   Report Status 05/20/2015 FINAL  Final  Culture, blood (Routine X 2) w Reflex to ID Panel     Status: None (Preliminary result)   Collection Time: 05/18/15 10:10 AM  Result Value Ref Range Status   Specimen Description BLOOD RIGHT HAND  Final   Special Requests IN PEDIATRIC BOTTLE 3CC  Final   Culture NO GROWTH 2 DAYS  Final   Report Status PENDING  Incomplete  Culture, blood (Routine X 2) w Reflex to ID Panel     Status: None (Preliminary result)   Collection Time: 05/18/15 10:15 AM  Result Value Ref Range Status   Specimen Description BLOOD LEFT HAND  Final   Special Requests IN PEDIATRIC BOTTLE 3CC  Final   Culture NO GROWTH 2 DAYS  Final   Report Status PENDING  Incomplete    Coagulation Studies: No results for input(s): LABPROT, INR in the last 72 hours.  Imaging: Dg Chest Port 1 View  05/21/2015  CLINICAL DATA:  Acute respiratory failure EXAM: PORTABLE CHEST 1 VIEW COMPARISON:  May 20, 2015 FINDINGS: The ETT terminates 16 mm above the carina. Recommend withdrawing 1 cm. No pneumothorax. No other interval changes. IMPRESSION: Low-lying ETT, 16 mm above the carina. Recommend withdrawing 1 cm. No other interval change. Electronically Signed   By: Gerome Sam III M.D   On: 05/21/2015 07:24   Dg Chest Port 1 View  05/20/2015  CLINICAL DATA:  Seizure. EXAM: PORTABLE CHEST 1 VIEW COMPARISON:  05/17/2015. FINDINGS: Endotracheal tube, NG tube in stable position. Low lung volumes with progressive basilar atelectasis and/or infiltrates. Small left pleural effusion. No pneumothorax. Heart size stable No  pulmonary venous congestion. No pneumothorax. IMPRESSION: 1. Lines and tubes in stable position. 2. Low lung volumes with progressive bibasilar atelectasis and/or infiltrates. Small left pleural effusion. Electronically Signed   By: Maisie Fus  Register   On: 05/20/2015 08:00    Medications: Medication list reviewed.  Assessment: 1. Encephalopathy with waxy rigidity. 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 DDx also includes neuroleptic malignant syndrome and toxic encephalopathy.  Overall presentation would be atypical for benzodiazepine withdrawal. MRI study obtained 2/20 revealed stable and normal MRI appearance of the brain. CSF results showed normal glucose, elevated protein of 60, 22 RBC without xanthochromia and 2 WBC. Cryptococcal antigen was negative. HIV testing negative.  2. Patient has indicated that he was trying to discontinue one of his home medications. He indicated "no" when asked if he was withdrawing from the following: EtOH, amphetamine, cocaine. 3. Coagulase negative staphylococcus bacteremia. Per ID, his TEE was negative and since LP was also benign he does not have endocarditis or CNS involvement. Improving white count.  4. Most recent EEG showed no focal, hemispheric, or lateralizing features and was within normal limits for a sedated EEG.Most of the recording was spent in stage II sleep architecture.There was very little waking time on the recording, but during the brief period in which he was stimulated, the occipital dominant rhythm was normal. There was no epileptiform activity recorded.  Recommendations: 1. Continue Keppra, Depakote and Klonopin.  2. Continue PRN Ativan. Requiring 2-5 mg IV for occasional seizure-like episodes, occurring approximately once per day over the last several days. Unclear if these are seizures. Continuous EEG monitoring has been ordered for today and tomorrow to capture an episode and determine if it is epileptic.  3.  Currently on cefazolin.  4. CIWA protocol.  5. DVT prophylaxis with SCDs.  6. 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 Dosing is 2.5 mg through nasogastric tube q6-8h and titrated up to a maximum dose of 40 mg/day. This can be continued for 10 days after NMS is controlled and then tapered slowly.  7. Pharmacy or toxicology consult to determine if any of the meds he was taking at home had enough of a dopaminergic effect to result in catatonia if it were to be abruptly discontinued.  8. Consider thoracic and lumbar spine MRI to rule out epidural abscess or other lesion.    Caryl Pina, MD 05/21/2015, 9:27 AM

## 2015-05-21 NOTE — Progress Notes (Signed)
Patient placed on CPAP/PSV 5/5 with no complications. Nurse and MD aware, will continue to monitor pt.

## 2015-05-21 NOTE — Progress Notes (Signed)
PULMONARY / CRITICAL CARE MEDICINE   Name: Chase Hampton MRN: [id number] DOB: 26-Nov-1990    ADMISSION DATE:  05/15/2015 CONSULTATION DATE:  05/15/15  REFERRING MD:  Dr. Normand Sloop   CHIEF COMPLAINT:  Seizure, Acute Respiratory Failure   HISTORY OF PRESENT ILLNESS:  25 y/o M with PMH of GERD, anxiety and depression (prior [facility] hospitalizations) with recent admission to [facility] in [place] from 2/3-2/8 for major depressive disorder with homicidal / suicidal ideations after a recent break up with his fiancee.  Due to restraining order violations, he was transferred from [facility] to [facility] jail were he has been on suicide watch since arrival.  The patient reportedly refused to eat, drink or take medications.  He was reportedly seen lying in the floor and would urinate on himself.  After multiple episodes of urinating on himself, he as asked to mop up the floor which he was able to perform.  Prior to discharge from [facility], notes reflect he had multiple episodes of physically aggressive behavior - including striking his own head on the seclusion room walls.  At discharge he was to continue on Cymbalta, Depakote and Saphris.   Prison staff report the patient refused all medications after arrival.    On 2/19, the patient was noted to have a seizure in jail.  He was transported to [facility] ER via EMS.  It is documented that he arrived to the ER with urine and feces all over him.  There is question if patient was weaning off ativan?? Per [facility] notes but discharge note from [facility] does not include Rx for ativan. Room air saturations were 82%, he was treated with 100% NRB.  He was treated with  IV versed per EMS in route.  The patient had a second seizure in ER and was ultimately intubated.  He was transferred to [facility] ICU for further evaluation.    The patient arrived on propofol, mechanically intubated.    SUBJECTIVE:  Had seizures yesterday.  VITAL SIGNS: BP 105/74 mmHg  Pulse 115  Temp(Src) 100.8 F (38.2 C) (Axillary)   Resp 20  Ht  (1.803 m)  Wt 268 lb 4.8 oz (121.7 kg)  BMI 37.44 kg/m2  SpO2 98%  HEMODYNAMICS:    VENTILATOR SETTINGS: Vent Mode:  [-] CPAP FiO2 (%):  [30 %] 30 % Set Rate:  [15 bmp] 15 bmp Vt Set:  [600 mL] 600 mL PEEP:  [5 cmH20] 5 cmH20 Pressure Support:  [5 cmH20-8 cmH20] 8 cmH20 Plateau Pressure:  [18 cmH20-20 cmH20] 19 cmH20  INTAKE / OUTPUT: I/O last 3 completed shifts: In: 2820 [I.V.:350; NG/GT:1830; IV Piggyback:640] Out: 4670 [Urine:4670]  PHYSICAL EXAMINATION: General:  No distress, sedated Neuro: PERRL HEENT:  ETT in place. No thyromegaly, JVD Cardiovascular:  S1, S2, RRR. No MRG Lungs:  Clear Abdomen:  Soft, + BS  Skin:  Intact  LABS:  BMET  Recent Labs Lab 05/18/15 0420 05/20/15 0508 05/21/15 0346  NA 143 143 146*  K 3.9 3.7 3.7  CL 106 108 105  CO2 BUN 19 26* 30*  CREATININE 0.76 0.57* 0.76  GLUCOSE 117* 114* 131*    Electrolytes  Recent Labs Lab 05/16/15 0331  05/18/15 0420 05/20/15 0508 05/21/15 0346  CALCIUM 8.7*  < > 8.5* 8.5* 8.8*  MG 2.2  --   --   --  2.1  PHOS 3.2  --   --   --  4.2  < > = values in this interval not displayed.  CBC  Recent Labs Lab 05/18/15 0420 05/20/15 0508 05/21/15 0346  WBC 8.8 8.0 8.6  HGB 12.4* 12.9* 12.9*  HCT 38.1* 38.6* 39.7  PLT 75* 96* 123*    Coag's  Recent Labs Lab 05/15/15 1618  APTT 34  INR 1.34    Sepsis Markers  Recent Labs Lab 05/15/15 1113 05/15/15 1420 05/15/15 1751  LATICACIDVEN 2.0 1.9  --   PROCALCITON  --   --  <0.10    ABG  Recent Labs Lab 05/15/15 1114  PHART 7.49*  PCO2ART 40  PO2ART 101    Liver Enzymes  Recent Labs Lab 05/15/15 1113 05/18/15 0420 05/20/15 0508  AST 184* 59* 55*  ALT 92* 58 62  ALKPHOS 65 45 51  BILITOT 1.5* 0.8 0.6  ALBUMIN 3.9 2.2* 2.2*    Cardiac Enzymes  Recent Labs Lab 05/15/15 1113  TROPONINI 0.05*    Glucose  Recent Labs Lab 05/20/15 1539 05/20/15 1934 05/20/15 2338  05/21/15 0350 05/21/15 0738 05/21/15 1159  GLUCAP 96 97 105* 122* 107* 90    Imaging Dg Chest Port 1 View  05/21/2015  CLINICAL DATA:  Acute respiratory failure EXAM: PORTABLE CHEST 1 VIEW COMPARISON:  May 20, 2015 FINDINGS: The ETT terminates 16 mm above the carina. Recommend withdrawing 1 cm. No pneumothorax. No other interval changes. IMPRESSION: Low-lying ETT, 16 mm above the carina. Recommend withdrawing 1 cm. No other interval change. Electronically Signed   By: Gerome Sam III M.D   On: 05/21/2015 07:24    STUDIES:  LP 2/20 >>  MRI brain 2/20 >> cancelled  EEG 2/20 >>  No active seizures.  Repeat EEG>>>  CULTURES: LP 2/21 >>>  Reported coag neg staph BC, assume 19th BC 2/19>>>> BC 2/22>>>  ANTIBIOTICS: Rocephin 2gm 2/19 >>  Vanco 2/20 >>   SIGNIFICANT EVENTS: 2/19  Admit from jail with seizure, intubated  2/22- focal seizures mouth but LOS associated 2/23, 2/24- AM seziures  LINES/TUBES: ETT 2/19 >>   DISCUSSION: 25 y/o M with PMH of major depression with multiple PSY admissions admitted 2/19 with seizure (no hx). Intubated in ER and transferred to [facility] ICU.    ASSESSMENT / PLAN:  PULMONARY A: Acute Hypoxic Respiratory Failure - in setting of seizure, brief hypoxia, resolved  Hilar prominence int prom pcxr 2/24 P:   Weaning but neuro status does not allow support extubation.  CARDIOVASCULAR A:  Hypotension - resolved Negative TEE for vegetations  P:  ICU monitoring of hemodynamics  Follow repeat Bcx  RENAL A:   Rhabdomyolysis - admit CK >7500 POS balance P:   Lasix diuresis   GASTROINTESTINAL A:   Obesity  constipation P:   TF to goal SUP   HEMATOLOGIC A:   Leukocytosis resolved Thrombocytopenia  improving P:  Repeat CBC in AM  INFECTIOUS A:   R/o contamination coag neg staph NO fevers P:   Repeat BC neg, follow to final Ancef for 14 days per ID Consider MRI spine.  ENDOCRINE A:   Mild hyperglycemia  P:   Trend  glucose on BMP   NEUROLOGIC A:   Seizure - suspect related to abrupt cessation of medications +/- benzo withdrawal.  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 Holding off on MRI spine as pt is on 24 hr EEG. Repeat bcx are negative.  FAMILY  -  Updates: Police not allowing direct updates to family. Complying with police , allowing updated, I will talk to family 2/24 - Inter-disciplinary family meet or Palliative Care meeting due by: 2/26  Critical care time- 35 mins.  Chilton Greathouse MD  Pulmonary and Critical Care Pager [phone] If no answer or after 3pm call: [phone] 05/21/2015, 12:45 PM

## 2015-05-22 LAB — GLUCOSE, CAPILLARY
GLUCOSE-CAPILLARY: 121 mg/dL — AB (ref 65–99)
GLUCOSE-CAPILLARY: 125 mg/dL — AB (ref 65–99)
GLUCOSE-CAPILLARY: 115 mg/dL — AB (ref 65–99)
Glucose-Capillary: 109 mg/dL — ABNORMAL HIGH (ref 65–99)
Glucose-Capillary: 93 mg/dL (ref 65–99)
Glucose-Capillary: 100 mg/dL — ABNORMAL HIGH (ref 65–99)

## 2015-05-22 MED ORDER — CHLORHEXIDINE GLUCONATE 0.12% ORAL RINSE (MEDLINE KIT)
15.0000 mL | Freq: Two times a day (BID) | OROMUCOSAL | Status: DC
  Administered 2015-05-22 – 2015-06-01 (×22): 15 mL via OROMUCOSAL

## 2015-05-22 MED ORDER — ANTISEPTIC ORAL RINSE SOLUTION (CORINZ)
7.0000 mL | OROMUCOSAL | Status: DC
  Administered 2015-05-22 – 2015-05-26 (×36): 7 mL via OROMUCOSAL

## 2015-05-22 MED ORDER — FUROSEMIDE 10 MG/ML IJ SOLN
40.0000 mg | Freq: Two times a day (BID) | INTRAMUSCULAR | Status: AC
  Administered 2015-05-22 – 2015-05-24 (×4): 40 mg via INTRAVENOUS
  Filled 2015-05-22 (×4): qty 4

## 2015-05-22 NOTE — Progress Notes (Signed)
Additional recommendation:  Review of the literature performed. Of note, asenapine has a side effect profile that includes somnolence (greater than 10%), seizures (less than 1%) and extrapyramidal side effects (1-10%). Although relatively unlikely to be the underlying etiology for the patient's presentation, benefits of discontinuation are felt to outweigh risks. I have discontinued the order for this medication.   Recommendation/Plan: 1. Asenapine has been discontinued for now. 2. Call psychiatry for additional information regarding risks/benefits of this medication and whether it should be restarted at a future date.   Caryl Pina, MD

## 2015-05-22 NOTE — Progress Notes (Signed)
PULMONARY / CRITICAL CARE MEDICINE   Name: Chase Hampton MRN: [id number] DOB: [birth year]    ADMISSION DATE:  05/15/2015 CONSULTATION DATE:  05/15/15  REFERRING MD:  Dr. Normand Sloop   CHIEF COMPLAINT:  Seizure, Acute Respiratory Failure   HISTORY OF PRESENT ILLNESS:  25 y/o M with PMH of GERD, anxiety and depression (prior [facility] hospitalizations) with recent admission to [facility] in [place] from 2/3-2/8 for major depressive disorder with homicidal / suicidal ideations after a recent break up with his fiancee.  Due to restraining order violations, he was transferred from [facility] to [facility] jail were he has been on suicide watch since arrival.  The patient reportedly refused to eat, drink or take medications.  He was reportedly seen lying in the floor and would urinate on himself.  After multiple episodes of urinating on himself, he as asked to mop up the floor which he was able to perform.  Prior to discharge from [facility], notes reflect he had multiple episodes of physically aggressive behavior - including striking his own head on the seclusion room walls.  At discharge he was to continue on Cymbalta, Depakote and Saphris.   Prison staff report the patient refused all medications after arrival.    On 2/19, the patient was noted to have a seizure in jail.  He was transported to [facility] ER via EMS.  It is documented that he arrived to the ER with urine and feces all over him.  There is question if patient was weaning off ativan?? Per [facility] notes but discharge note from [facility] does not include Rx for ativan. Room air saturations were 82%, he was treated with 100% NRB.  He was treated with  IV versed per EMS in route.  The patient had a second seizure in ER and was ultimately intubated.  He was transferred to [facility] ICU for further evaluation.    The patient arrived on propofol, mechanically intubated.    SUBJECTIVE:  Continues to have seizures at least once/day.  VITAL SIGNS: BP 106/57 mmHg  Pulse 101  Temp(Src) 99.9 F  (37.7 C) (Axillary)  Resp 16  Ht  (1.803 m)  Wt 264 lb 12.4 oz (120.1 kg)  BMI 36.94 kg/m2  SpO2 98%  HEMODYNAMICS:   VENTILATOR SETTINGS: Vent Mode:  [-] PRVC FiO2 (%):  [30 %] 30 % Set Rate:  [15 bmp] 15 bmp Vt Set:  [600 mL] 600 mL PEEP:  [5 cmH20] 5 cmH20 Pressure Support:  [8 cmH20] 8 cmH20 Plateau Pressure:  [18 cmH20-21 cmH20] 19 cmH20  INTAKE / OUTPUT: I/O last 3 completed shifts: In: 2850 [I.V.:340; NG/GT:1750; IV Piggyback:760] Out: 5165 [Urine:5165]  PHYSICAL EXAMINATION: General:  No distress, sedated Neuro: PERRL HEENT:  ETT in place. No thyromegaly, JVD Cardiovascular:  S1, S2, RRR. No MRG Lungs:  Clear Abdomen:  Soft, + BS  Skin:  Intact  LABS:  BMET  Recent Labs Lab 05/18/15 0420 05/20/15 0508 05/21/15 0346  NA 143 143 146*  K 3.9 3.7 3.7  CL 106 108 105  CO2 BUN 19 26* 30*  CREATININE 0.76 0.57* 0.76  GLUCOSE 117* 114* 131*    Electrolytes  Recent Labs Lab 05/16/15 0331  05/18/15 0420 05/20/15 0508 05/21/15 0346  CALCIUM 8.7*  < > 8.5* 8.5* 8.8*  MG 2.2  --   --   --  2.1  PHOS 3.2  --   --   --  4.2  < > = values in this interval not  displayed.  CBC  Recent Labs Lab 05/18/15 0420 05/20/15 0508 05/21/15 0346  WBC 8.8 8.0 8.6  HGB 12.4* 12.9* 12.9*  HCT 38.1* 38.6* 39.7  PLT 75* 96* 123*    Coag's  Recent Labs Lab 05/15/15 1618  APTT 34  INR 1.34    Sepsis Markers  Recent Labs Lab 05/15/15 1751  PROCALCITON <0.10    ABG No results for input(s): PHART, PCO2ART, PO2ART in the last 168 hours.  Liver Enzymes  Recent Labs Lab 05/18/15 0420 05/20/15 0508  AST 59* 55*  ALT 58 62  ALKPHOS 45 51  BILITOT 0.8 0.6  ALBUMIN 2.2* 2.2*    Cardiac Enzymes No results for input(s): TROPONINI, PROBNP in the last 168 hours.  Glucose  Recent Labs Lab 05/21/15 1600 05/21/15 2027 05/21/15 2327 05/22/15 0315 05/22/15 0735 05/22/15 1115  GLUCAP 101* 110* 121* 125* 115* 109*     Imaging No results found.  STUDIES:  LP 2/20 >>  MRI brain 2/20 >> cancelled  EEG 2/20 >>  No active seizures.  Repeat EEG>>>  CULTURES: LP 2/21 >>>  Reported coag neg staph BC, assume 19th BC 2/19>>>> BC 2/22>>>  ANTIBIOTICS: Rocephin 2gm 2/19 >> 2/22 Vanco 2/20 >> 2/22 Cefazolin 2/22 >>  SIGNIFICANT EVENTS: 2/19  Admit from jail with seizure, intubated  2/22- focal seizures mouth but LOS associated  LINES/TUBES: ETT 2/19 >>   DISCUSSION: 25 y/o M with PMH of major depression with multiple PSY admissions admitted 2/19 with seizure (no hx). Intubated in ER and transferred to [facility] ICU.    ASSESSMENT / PLAN:  PULMONARY A: Acute Hypoxic Respiratory Failure - in setting of seizure, brief hypoxia, resolved  Hilar prominence int prom pcxr 2/24 P:   Weaning but neuro status does not allow support extubation.  CARDIOVASCULAR A:  Hypotension - resolved Negative TEE for vegetations  P:  ICU monitoring of hemodynamics  Follow repeat Bcx  RENAL A:   Rhabdomyolysis - admit CK >7500 > Improved P:   Lasix diuresis   GASTROINTESTINAL A:   Obesity  constipation P:   TF to goal SUP   HEMATOLOGIC A:   Leukocytosis resolved Thrombocytopenia  improving P:  Monitor  INFECTIOUS A:   R/o contamination coag neg staph NO fevers P:   Repeat BC neg Ancef for 14 days per ID Consider MRI spine.  ENDOCRINE A:   Mild hyperglycemia  P:   Trend glucose on BMP   NEUROLOGIC A:   Seizure - suspect related to abrupt cessation of medications +/- benzo withdrawal.  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 Holding off on MRI spine as pt is on continuous EEG. Repeat bcx are negative.  FAMILY  - Updates:No family at bedside - Inter-disciplinary family meet or Palliative Care meeting due by:  2/26  Critical care time- 35 mins.  Chilton Greathouse MD [place] and [facility] Pager [phone] If no answer or after 3pm call: [phone] 05/22/2015, 2:52 PM

## 2015-05-22 NOTE — Progress Notes (Signed)
Patient had three episodes of shivering and clenching on ETT tube setting off vent.  These occurred during my 12 hour shift.  During these 3 episodes, EEG reading looked unchanged.  I notified Dr. Otelia Limes and is aware of these episodes.  Continued to monitor patient.

## 2015-05-22 NOTE — Progress Notes (Signed)
Sign out to Monday AM Neurohospitalist team:  The information below, obtained from family, complements the more detailed psychiatric admission note from 2/4 at [facility]; of note, there are some differences in the story related by the family and the prior psychiatric admission note, most likely due to recall bias and passage of time.   Today, after my clinical assessment of Mr. Youngblood, his parents requested a meeting with me to discuss the patient's diagnostic and management plan. They were also helpful with regard to an extensive history of the events at home preceding this admission. Briefly, he had been going through what seems to have been a prolonged breaking up period with his fiance, with escalating tensions between the two surrounding wedding plans and a honeymoon. Eventually the relationship was broken off by one or both of them. The situation seems to have further destabilized when the patient, having second thoughts about the breakup, began texting his ex. She responded to this with a restraining order which he subsequently broke several times through unwanted texts and at least one "hang up call". During this period he was voluntarily committed once and then involuntarily committed by his family for aggressive/threatening behavior. He reportedly considered committing suicide at one point. He had begun showing signs of depression soon after moving back home upon graduating college the Summer before, and the events above occurred concomitantly with a gradual worsening of his depressive symptoms per family. In December, January and February there were further deteriorations in his social situation and behavior, culminating in an incarceration for violating his restraining order as well as an admission to an outside ED for 11 days and then an inpatient psychiatric ward for about 5 days for psychiatric symptoms. When he was returned to the jail, the agitation, head-banging and spells and what were thought to  represent seizures occurred, culminating in this admission to [facility] when he had what was thought to be two seizures at the jail. In my opinion, these may have been withdrawal seizures or initial symptoms of agitated catatonia, the former being the initial best explanation for his spells, per prior notes.    At the jail prior to his admission to [facility] and subsequent transfer to [facility] on 2/19, the patient had stopped eating or drinking for about 5 days (per family) and "the past few days" (per admission note) and stopped taking his medications including Saphris, Depakote and Cymbalta and clonazepam. He was taken to [facility] ED after his seizure - he was responsive after this and then had a second seizure and required intubation. His saturation, documented in a prior note, of 82% on room air prior to intubation was somewhat low but certainly not enough to result in anoxic brain injury. He was transported to [facility] due to the need for continuous EEG. As documented in my notes, none of his EEGs here have shown electrographic seizure acitivity.   I related to the family my thoughts regarding mr. Mcgaughy's DDx. 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 He may or may not have had an epileptic seizure at the jail. Benzodiazepine withdrawal would be the best explanation if indeed the spells at the jail precipitating this admission were seizures. 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   DDx review: -Again, given essentially normal EEG other than predominance of sleep pattern, as  well as the negative MRI, his presentation is not consistent with anoxic brain injury.  -He has not exhibited EEG findings consistent with status epilepticus. He has been arousable to follow commands, which is also not consistent with status. During his short spells, he has not been arousable, and they may have been seizures given resolution with Ativan, but again, no electrographic correlate with cEEG over the past 24 hours and the episodes of irregular jaw movements, at least two of which I have witnessed, appeared atypical for seizures.  -He has exhibited waxy rigidity, elevated cell count and other findings documented in my previous notes that appear most consistent with catatonia.   Treatment recommendations: 1. Wean from vent, then extubate. 2. After extubation, OOB to chair if possible.  3. Frequent interaction with patient. Consider recreational therapy consult as well as PT/OT/Speech once OOB to chair.  4. Continue all current anticonvulsant medications.  5. Discontinue cEEG on Monday AM if no electrographic seizures are noted on the remainder of this weekend's recording.  6. Obtain spine MRI studies that were previously recommended, although most likely these are likely to be of low yield.   A total of 60 additional minutes were spent in family meeting both in person and over the phone, as well as coordination of care with ICU team.  Caryl Pina, MD

## 2015-05-22 NOTE — Procedures (Addendum)
Electroencephalogram report- LTM  Beginning date or time: 05/22/2015 [id number] Ending date or time:05/23/2015 7:30AM  Day of study: day 2  Medications include: Per EMR  HISTORY: This 24 hours of intensive EEG monitoring with simultaneous video monitoring was performed for this patient with seizures and altered mental status. This EEG was requested to rule out subclinical electrographic seizures.  TECHNICAL DESCRIPTION:  The study consists of a continuous 16-channel multi-montage digital video EEG recording with twenty-one electrodes placed according to the International 10-20 System. Additional leads included eye leads, true temporal leads (T1, T2), and an EKG lead. Activation procedures were not done due to mental status.  REPORT: The majority of the recording showed sleep, with vertex waves and sleep spindles. The awake background activity about  theta background, but unclear if it was during full alertness. The activity was symmetrical and seemed reactive to stimuli. No focal slowing or epileptiform activity was identified.   There were two event button pushes during the monitoring - at 6:07 PM and 9:26PM. Both the times, nurses were at bedside providing care. Electrographically, mild muscle artifact was seen in the temporal leads during the events. No underlying epileptiform activity was seen.  IMPRESSION: This EEG shows: No focal or paroxysmal activity  CLINICAL CORRELATION: The two event buttons pushed did not have any electrographic epileptiform correlate to suggest epileptic seizures. Clinical correlation is required.         -------------------------------------------------------------------------------------- Ordering Physician : Dr. Otelia Limes    Beginning date or time: 05/21/2015 11AM Ending date or time:05/22/2015 7:30AM  Day of study: day 1  Medications include: Per EMR  HISTORY: This 24 hours of intensive EEG monitoring with simultaneous video monitoring was performed  for this patient with seizures and altered mental status. This EEG was requested to rule out subclinical electrographic seizures.  TECHNICAL DESCRIPTION:  The study consists of a continuous 16-channel multi-montage digital video EEG recording with twenty-one electrodes placed according to the International 10-20 System. Additional leads included eye leads, true temporal leads (T1, T2), and an EKG lead. Activation procedures were not done due to mental status.  REPORT: The majority of the recording showed sleep, with vertex waves and sleep spindles. The brief awake background activity about  theta background, but unclear if it was during full alertness. The activity was symmetrical and seemed reactive to stimuli. No focal slowing or epileptiform activity was identified.   IMPRESSION: This EEG shows: No focal or paroxysmal activity  CLINICAL CORRELATION: This was mainly sleep and sedated EEG. No electrographic seizures were seen.

## 2015-05-22 NOTE — Progress Notes (Signed)
Subjective: Had one seizure-like spell last night at 4:35 AM with same semiology as prior episodes. Episode resolved with 2 mg IV Ativan.   Objective: Current vital signs: BP 105/68 mmHg  Pulse 110  Temp(Src) 99.8 F (37.7 C) (Axillary)  Resp 18  Ht  (1.803 m)  Wt 120.1 kg (264 lb 12.4 oz)  BMI 36.94 kg/m2  SpO2 98% Vital signs in last 24 hours: Temp:  [99.1 F (37.3 C)-100.8 F (38.2 C)] 99.8 F (37.7 C) (02/26 0800) Pulse Rate:  [93-118] 110 (02/26 1100) Resp:  [15-27] 18 (02/26 1100) BP: (91-121)/(53-92) 105/68 mmHg (02/26 1100) SpO2:  [95 %-99 %] 98 % (02/26 1100) FiO2 (%):  [30 %] 30 % (02/26 0825) Weight:  [120.1 kg (264 lb 12.4 oz)] 120.1 kg (264 lb 12.4 oz) (02/26 0300)  Intake/Output from previous day: 02/25 0701 - 02/26 0700 In: 1870 [I.V.:230; NG/GT:1150; IV Piggyback:490] Out: 3475 [Urine:3475] Intake/Output this shift: Total I/O In: 470 [I.V.:50; NG/GT:250; IV Piggyback:170] Out: 400 [Urine:400] Nutritional status: Diet NPO time specified  Neurologic Exam: Unchanged since yesterday:  Ment: Catatonic. Opens eyes partially to command. Nods and shakes head weakly in response to yes/no questions.  CN: PERRL, pupils relatively large at baseline, face symmetric. Intubated. Motor: Waxy rigidity in upper and lower extremities is not significantly changed since yesterday.   Lab Results: Basic Metabolic Panel:  Recent Labs Lab 05/16/15 0331 05/17/15 0413 05/18/15 0420 05/20/15 0508 05/21/15 0346  NA 146* 145 143 143 146*  K 3.8 3.9 3.9 3.7 3.7  CL 112* 106 106 108 105  CO2 GLUCOSE 115* 115* 117* 114* 131*  BUN 26* 30*  CREATININE 0.86 0.95 0.76 0.57* 0.76  CALCIUM 8.7* 8.6* 8.5* 8.5* 8.8*  MG 2.2  --   --   --  2.1  PHOS 3.2  --   --   --  4.2    Liver Function Tests:  Recent Labs Lab 05/18/15 0420 05/20/15 0508  AST 59* 55*  ALT 58 62  ALKPHOS 45 51  BILITOT 0.8 0.6  PROT 5.0* 5.2*  ALBUMIN 2.2* 2.2*    No results for input(s): LIPASE, AMYLASE in the last 168 hours. No results for input(s): AMMONIA in the last 168 hours.  CBC:  Recent Labs Lab 05/16/15 0331 05/17/15 0413 05/18/15 0420 05/20/15 0508 05/21/15 0346  WBC 10.6* 10.4 8.8 8.0 8.6  NEUTROABS  --   --   --  4.8 5.5  HGB 13.5 13.1 12.4* 12.9* 12.9*  HCT 41.4 39.7 38.1* 38.6* 39.7  MCV 88.3 87.3 87.0 88.7 86.9  PLT 82* 66* 75* 96* 123*    Cardiac Enzymes:  Recent Labs Lab 05/18/15 0420  CKTOTAL 1235*    Lipid Panel:  Recent Labs Lab 05/17/15 0413 05/20/15 0508  TRIG 101 91    CBG:  Recent Labs Lab 05/21/15 2027 05/21/15 2327 05/22/15 0315 05/22/15 0735 05/22/15 1115  GLUCAP 110* 121* 125* 115* 109*    Microbiology: Results for orders placed or performed during the hospital encounter of 05/15/15  MRSA PCR Screening     Status: None   Collection Time: 05/15/15  3:49 PM  Result Value Ref Range Status   MRSA by PCR NEGATIVE NEGATIVE Final    Comment:        The GeneXpert MRSA Assay (FDA approved for NASAL specimens only), is one component of a comprehensive MRSA colonization surveillance program. It is not intended to diagnose MRSA infection  nor to guide or monitor treatment for MRSA infections.   Culture, blood (routine x 2)     Status: None   Collection Time: 05/15/15  9:53 PM  Result Value Ref Range Status   Specimen Description BLOOD RIGHT ANTECUBITAL  Final   Special Requests BOTTLES DRAWN AEROBIC ONLY 5CC  Final   Culture NO GROWTH 5 DAYS  Final   Report Status 05/20/2015 FINAL  Final  Culture, blood (routine x 2)     Status: None   Collection Time: 05/15/15 10:06 PM  Result Value Ref Range Status   Specimen Description BLOOD RIGHT ANTECUBITAL  Final   Special Requests BOTTLES DRAWN AEROBIC ONLY 5CC  Final   Culture NO GROWTH 5 DAYS  Final   Report Status 05/20/2015 FINAL  Final  CSF culture     Status: None   Collection Time: 05/17/15  3:01 PM  Result Value Ref Range  Status   Specimen Description CSF  Final   Special Requests NONE  Final   Gram Stain   Final    CYTOSPIN SMEAR WBC PRESENT, PREDOMINANTLY MONONUCLEAR NO ORGANISMS SEEN    Culture NO GROWTH 3 DAYS  Final   Report Status 05/20/2015 FINAL  Final  Culture, blood (Routine X 2) w Reflex to ID Panel     Status: None (Preliminary result)   Collection Time: 05/18/15 10:10 AM  Result Value Ref Range Status   Specimen Description BLOOD RIGHT HAND  Final   Special Requests IN PEDIATRIC BOTTLE 3CC  Final   Culture NO GROWTH 3 DAYS  Final   Report Status PENDING  Incomplete  Culture, blood (Routine X 2) w Reflex to ID Panel     Status: None (Preliminary result)   Collection Time: 05/18/15 10:15 AM  Result Value Ref Range Status   Specimen Description BLOOD LEFT HAND  Final   Special Requests IN PEDIATRIC BOTTLE 3CC  Final   Culture NO GROWTH 3 DAYS  Final   Report Status PENDING  Incomplete    Coagulation Studies: No results for input(s): LABPROT, INR in the last 72 hours.  Imaging: Dg Chest Port 1 View  05/21/2015  CLINICAL DATA:  Acute respiratory failure EXAM: PORTABLE CHEST 1 VIEW COMPARISON:  May 20, 2015 FINDINGS: The ETT terminates 16 mm above the carina. Recommend withdrawing 1 cm. No pneumothorax. No other interval changes. IMPRESSION: Low-lying ETT, 16 mm above the carina. Recommend withdrawing 1 cm. No other interval change. Electronically Signed   By: Gerome Sam III M.D   On: 05/21/2015 07:24    Medications:  Current facility-administered medications:  .  0.9 %  sodium chloride infusion, 250 mL, Intravenous, PRN, Cyril Mourning V, MD .  0.9 %  sodium chloride infusion, , Intravenous, Continuous, Lewayne Bunting, MD .  acetaminophen (TYLENOL) tablet 650 mg, 650 mg, Oral, Q4H PRN, Cyril Mourning V, MD, 650 mg at 05/18/15 0534 .  antiseptic oral rinse solution (CORINZ), 7 mL, Mouth Rinse, 10 times per day, Oretha Milch, MD, 7 mL at 05/22/15 0934 .  asenapine (SAPHRIS)  sublingual tablet 5 mg, 5 mg, Sublingual, QHS, Brandi L Ollis, NP, 5 mg at 05/21/15 2120 .  ceFAZolin (ANCEF) IVPB 2 g/50 mL premix, 2 g, Intravenous, Q8H, Cyril Mourning V, MD, 2 g at 05/22/15 0606 .  chlorhexidine gluconate (PERIDEX) 0.12 % solution 15 mL, 15 mL, Mouth Rinse, BID, Cyril Mourning V, MD, 15 mL at 05/22/15 0800 .  clonazePAM (KLONOPIN) tablet 1 mg, 1 mg, Oral, QHS,  Nelda Bucks, MD, 1 mg at 05/21/15 2119 .  dextrose 5 %-0.45 % sodium chloride infusion, , Intravenous, Continuous, Leslye Peer, MD, Last Rate: 10 mL/hr at 05/20/15 1900 .  DULoxetine (CYMBALTA) DR capsule 60 mg, 60 mg, Oral, Daily, Jeanella Craze, NP, 60 mg at 05/22/15 0926 .  feeding supplement (PRO-STAT SUGAR FREE 64) liquid 60 mL, 60 mL, Per Tube, BID, Leslye Peer, MD, 60 mL at 05/22/15 0926 .  feeding supplement (VITAL HIGH PROTEIN) liquid 1,000 mL, 1,000 mL, Per Tube, Continuous, Leslye Peer, MD, Last Rate: 50 mL/hr at 05/21/15 1713, 1,000 mL at 05/21/15 1713 .  fentaNYL (SUBLIMAZE) injection 100 mcg, 100 mcg, Intravenous, Q15 min PRN, Cyril Mourning V, MD .  fentaNYL (SUBLIMAZE) injection 100 mcg, 100 mcg, Intravenous, Q2H PRN, Cyril Mourning V, MD, 100 mcg at 05/22/15 0009 .  lactulose (CHRONULAC) 10 GM/15ML solution 20 g, 20 g, Oral, Daily, Nelda Bucks, MD, 20 g at 05/21/15 1001 .  levETIRAcetam (KEPPRA) 1,500 mg in sodium chloride 0.9 % 100 mL IVPB, 1,500 mg, Intravenous, Q12H, Caryl Pina, MD, 1,500 mg at 05/22/15 1110 .  LORazepam (ATIVAN) injection 1-2 mg, 1-2 mg, Intravenous, Q4H PRN, Cyril Mourning V, MD, 2 mg at 05/22/15 0440 .  neomycin-bacitracin-polymyxin (NEOSPORIN) ointment, , Topical, BID, Cyril Mourning V, MD .  pantoprazole sodium (PROTONIX) 40 mg/20 mL oral suspension 40 mg, 40 mg, Per Tube, Daily, Cyril Mourning V, MD, 40 mg at 05/22/15 0926 .  valproate (DEPACON) 500 mg in dextrose 5 % 50 mL IVPB, 500 mg, Intravenous, Q12H, Ulice Dash, PA-C, 500 mg at 05/22/15 1914   Assessment/Plan: 1.  Encephalopathy with waxy rigidity. 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 DDx also includes neuroleptic malignant syndrome and toxic encephalopathy. Overall presentation would be atypical for benzodiazepine withdrawal. MRI study obtained 2/20 revealed stable and normal MRI appearance of the brain. CSF results showed normal glucose, elevated protein of 60, 22 RBC without xanthochromia and 2 WBC. Cryptococcal antigen was negative. HIV testing negative.  2. Patient has indicated that he was trying to discontinue one of his home medications. He indicated "no" when asked if he was withdrawing from the following: EtOH, amphetamine, cocaine. 3. LTM EEG report describes no electrographic seizures overnight. Discussed the 4:35 AM event with the epileptologist and he will review the record again to determine if there were subtle electrographic abnormalities occurring on the EEG at that time.   Recommendations: 1. Continue Keppra, Depakote and Klonopin.  2. Continue PRN Ativan. Requiring 2-5 mg IV for occasional seizure-like episodes, occurring approximately once per day over the last several days. Unclear if these are seizures. Continuous EEG monitoring this weekend to capture an episode and determine if it is epileptic.  3. Currently on cefazolin.  4. CIWA protocol.  5. DVT prophylaxis with SCDs.  6. 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 Dosing is 2.5 mg through nasogastric tube q6-8h and titrated up to a maximum dose of 40 mg/day. This can be continued for 10 days after NMS is controlled and then tapered slowly.  7. Pharmacy or toxicology consult to determine if any of the meds he was taking at home had enough of a dopaminergic effect to result in catatonia if it were to be abruptly discontinued.  8. Consider thoracic and lumbar spine MRI to rule out epidural abscess or other lesion.   Caryl Pina, MD 05/22/2015,  11:44 AM

## 2015-05-23 ENCOUNTER — Inpatient Hospital Stay (HOSPITAL_COMMUNITY): Payer: BLUE CROSS/BLUE SHIELD

## 2015-05-23 LAB — CBC
HEMATOCRIT: 40.9 % (ref 39.0–52.0)
HEMOGLOBIN: 13.6 g/dL (ref 13.0–17.0)
MCH: 29.4 pg (ref 26.0–34.0)
MCHC: 33.3 g/dL (ref 30.0–36.0)
MCV: 88.3 fL (ref 78.0–100.0)
Platelets: 157 10*3/uL (ref 150–400)
RBC: 4.63 MIL/uL (ref 4.22–5.81)
RDW: 13.9 % (ref 11.5–15.5)
WBC: 9.7 10*3/uL (ref 4.0–10.5)

## 2015-05-23 LAB — GLUCOSE, CAPILLARY
GLUCOSE-CAPILLARY: 106 mg/dL — AB (ref 65–99)
GLUCOSE-CAPILLARY: 111 mg/dL — AB (ref 65–99)
Glucose-Capillary: 108 mg/dL — ABNORMAL HIGH (ref 65–99)
Glucose-Capillary: 112 mg/dL — ABNORMAL HIGH (ref 65–99)
Glucose-Capillary: 116 mg/dL — ABNORMAL HIGH (ref 65–99)
Glucose-Capillary: 117 mg/dL — ABNORMAL HIGH (ref 65–99)
Glucose-Capillary: 97 mg/dL (ref 65–99)

## 2015-05-23 LAB — CULTURE, BLOOD (ROUTINE X 2)
Culture: NO GROWTH
Culture: NO GROWTH

## 2015-05-23 LAB — BASIC METABOLIC PANEL
Anion gap: 14 (ref 5–15)
BUN: 37 mg/dL — AB (ref 6–20)
CALCIUM: 8.9 mg/dL (ref 8.9–10.3)
CO2: 29 mmol/L (ref 22–32)
Chloride: 101 mmol/L (ref 101–111)
Creatinine, Ser: 0.73 mg/dL (ref 0.61–1.24)
GFR calc Af Amer: >60 mL/min (ref >60)
GLUCOSE: 113 mg/dL — AB (ref 65–99)
Potassium: 3.9 mmol/L (ref 3.5–5.1)
Sodium: 144 mmol/L (ref 135–145)

## 2015-05-23 LAB — AMMONIA: Ammonia: 38 umol/L — ABNORMAL HIGH (ref 9–35)

## 2015-05-23 LAB — TRIGLYCERIDES: Triglycerides: 98 mg/dL (ref <150)

## 2015-05-23 MED ORDER — SODIUM CHLORIDE 0.9 % IV SOLN
500.0000 mg | Freq: Two times a day (BID) | INTRAVENOUS | Status: DC
  Administered 2015-05-23 – 2015-05-24 (×2): 500 mg via INTRAVENOUS
  Filled 2015-05-23 (×3): qty 5

## 2015-05-23 MED ORDER — DULOXETINE HCL 30 MG PO CPEP
30.0000 mg | ORAL_CAPSULE | Freq: Every day | ORAL | Status: DC
  Administered 2015-05-26 – 2015-05-28 (×3): 30 mg via ORAL
  Filled 2015-05-23 (×6): qty 1

## 2015-05-23 NOTE — Progress Notes (Signed)
LTM D/C per Dr Lavon Paganini. No skin breakdown noted.

## 2015-05-23 NOTE — Progress Notes (Signed)
PULMONARY / CRITICAL CARE MEDICINE   Name: Chase Hampton MRN: [id number] DOB: May 19, 1990    ADMISSION DATE:  05/15/2015 CONSULTATION DATE:  05/15/15  REFERRING MD:  Dr. Normand Sloop   CHIEF COMPLAINT:  Seizure, Acute Respiratory Failure   HISTORY OF PRESENT ILLNESS:  25 y/o M with PMH of GERD, anxiety and depression (prior [facility] hospitalizations) with recent admission to [facility] in [place] from 2/3-2/8 for major depressive disorder with homicidal / suicidal ideations after a recent break up with his fiancee.  Due to restraining order violations, he was transferred from [facility] to [facility] jail were he has been on suicide watch since arrival.  The patient reportedly refused to eat, drink or take medications.  He was reportedly seen lying in the floor and would urinate on himself.  After multiple episodes of urinating on himself, he was asked to mop up the floor which he was able to perform.  Prior to discharge from [facility], notes reflect he had multiple episodes of physically aggressive behavior - including striking his own head on the seclusion room walls.  At discharge he was to continue on Cymbalta, Depakote and Saphris.   Prison staff report the patient refused all medications after arrival.    On 2/19, the patient was noted to have a seizure in jail.  He was transported to [facility] ER via EMS.  It is documented that he arrived to the ER with urine and feces all over him.  There is question if patient was weaning off ativan?? Per [facility] notes but discharge note from [facility] does not include Rx for ativan. Room air saturations were 82%, he was treated with 100% NRB.  He was treated with 4mg  IV versed per EMS in route.  The patient had a second seizure in ER and was ultimately intubated.  He was transferred to [facility] ICU for further evaluation.    The patient arrived on propofol, mechanically intubated.    SUBJECTIVE:  Continues to have seizures at least once/day.  VITAL SIGNS: BP 118/81 mmHg  Pulse 92  Temp(Src) 98.2 F  (36.8 C) (Axillary)  Resp 18  Ht 5\' 11"  (1.803 m)  Wt 119.1 kg (262 lb 9.1 oz)  BMI 36.64 kg/m2  SpO2 100%  HEMODYNAMICS:   VENTILATOR SETTINGS: Vent Mode:  [-] PRVC FiO2 (%):  [30 %] 30 % Set Rate:  [15 bmp] 15 bmp Vt Set:  [600 mL] 600 mL PEEP:  [5 cmH20] 5 cmH20 Plateau Pressure:  [18 cmH20-22 cmH20] 18 cmH20  INTAKE / OUTPUT: I/O last 3 completed shifts: In: 2910 [I.V.:350; NG/GT:1800; IV Piggyback:760] Out: 4780 [Urine:4780]  PHYSICAL EXAMINATION: General:  No distress, off sedation, not following any commands. Neuro: PERRL, Nl S1/S2, -M/R/G. HEENT:  ETT in place. No thyromegaly, JVD Cardiovascular:  S1, S2, RRR. No MRG Lungs:  Clear bilaterally. Abdomen:  Soft, NT, ND and +BS. Skin:  Intact  LABS:  BMET  Recent Labs Lab 05/20/15 0508 05/21/15 0346 05/23/15 0648  NA 143 146* 144  K 3.7 3.7 3.9  CL 108 105 101  CO2 26 29 29   BUN 26* 30* 37*  CREATININE 0.57* 0.76 0.73  GLUCOSE 114* 131* 113*   Electrolytes  Recent Labs Lab 05/20/15 0508 05/21/15 0346 05/23/15 0648  CALCIUM 8.5* 8.8* 8.9  MG  --  2.1  --   PHOS  --  4.2  --    CBC  Recent Labs Lab 05/20/15 0508 05/21/15 0346 05/23/15 0837  WBC 8.0 8.6 9.7  HGB 12.9* 12.9*  13.6  HCT 38.6* 39.7 40.9  PLT 96* 123* 157    Coag's No results for input(s): APTT, INR in the last 168 hours.  Sepsis Markers No results for input(s): LATICACIDVEN, PROCALCITON, O2SATVEN in the last 168 hours.  ABG No results for input(s): PHART, PCO2ART, PO2ART in the last 168 hours.  Liver Enzymes  Recent Labs Lab 05/18/15 0420 05/20/15 0508  AST 59* 55*  ALT 58 62  ALKPHOS 45 51  BILITOT 0.8 0.6  ALBUMIN 2.2* 2.2*    Cardiac Enzymes No results for input(s): TROPONINI, PROBNP in the last 168 hours.  Glucose  Recent Labs Lab 05/22/15 1115 05/22/15 1600 05/22/15 1932 05/22/15 2358 05/23/15 0402 05/23/15 0739  GLUCAP 109* 93 100* 97 117* 116*    Imaging Dg Chest Port 1  View  05/23/2015  CLINICAL DATA:  Hypoxia EXAM: PORTABLE CHEST 1 VIEW COMPARISON:  May 21, 2015 FINDINGS: Endotracheal tube tip is 2.0 cm above the carina. Nasogastric tube tip and side port are in the stomach. No pneumothorax. There is patchy atelectasis in the left base. The lungs elsewhere clear. Heart is borderline enlarged with pulmonary vascularity within normal limits. No adenopathy. No bone lesions. IMPRESSION: Tube positions as described without pneumothorax. Left base atelectasis. Lungs elsewhere clear. No change in cardiac silhouette. Electronically Signed   By: Bretta Bang III M.D.   On: 05/23/2015 07:42    STUDIES:  LP 2/20 >> No WBC MRI brain 2/20 >> cancelled  EEG 2/20 >>  No active seizures.  Repeat EEG>>>no seizure activity  CULTURES: LP 2/21 >>> NTD Reported coag neg staph BC, assume 19th BC 2/19>>>> BC 2/22>>>  ANTIBIOTICS: Rocephin 2gm 2/19 >> 2/22 Vanco 2/20 >> 2/22 Cefazolin 2/22 >>  SIGNIFICANT EVENTS: 2/19  Admit from jail with seizure, intubated  2/22- focal seizures mouth but LOS associated  LINES/TUBES: ETT 2/19 >>   DISCUSSION: 25 y/o M with PMH of major depression with multiple PSY admissions admitted 2/19 with seizure (no hx). Intubated in ER and transferred to [facility] ICU.    ASSESSMENT / PLAN:  PULMONARY A: Acute Hypoxic Respiratory Failure - in setting of seizure, brief hypoxia, resolved  Hilar prominence int prom pcxr 2/24 P:   Weaning but neuro status does not allow support extubation. Titrate O2 for sat of 88-92%.  CARDIOVASCULAR A:  Hypotension - resolved Negative TEE for vegetations  P:  ICU monitoring of hemodynamics  Follow repeat Bcx  RENAL A:   Rhabdomyolysis - admit CK >7500 > Improved P:   Lasix diuresis  BMET in AM. Replace electrolytes as indicated.  GASTROINTESTINAL A:   Obesity  constipation P:   TF to goal SUP   HEMATOLOGIC A:   Leukocytosis resolved Thrombocytopenia  improving P:   Monitor CBC in AM Transfuse per ICU protocol.  INFECTIOUS A:   R/o contamination coag neg staph NO fevers P:   Repeat BC neg Ancef for 14 days per ID  ENDOCRINE A:   Mild hyperglycemia  P:   Trend glucose on BMP   NEUROLOGIC A:   Seizure - suspect related to abrupt cessation of medications +/- benzo withdrawal.  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 Holding off on MRI spine as pt is on continuous EEG. Repeat bcx are negative. Check ammonia level.  FAMILY  - Updates: No family bedside. - Inter-disciplinary  family meet or Palliative Care meeting due by: 2/26  The patient is critically ill with multiple organ systems failure and requires high complexity decision making for assessment and support, frequent evaluation and titration of therapies, application of advanced monitoring technologies and extensive interpretation of multiple databases.   Critical Care Time devoted to patient care services described in this note is  35  Minutes. This time reflects time of care of this signee Dr Koren Bound. This critical care time does not reflect procedure time, or teaching time or supervisory time of PA/NP/Med student/Med Resident etc but could involve care discussion time.  Alyson Reedy, M.D. [facility] Pulmonary/Critical Care Medicine. Pager: [phone]. After hours pager: [phone].  05/23/2015, 10:51 AM

## 2015-05-23 NOTE — Progress Notes (Signed)
Interval History:                                                                                                                      Chase Hampton is an 25 y.o. male patient  admitted in ICU, intubated. His been off sedation. He was able to wean from the ventilator but due to continued mental status changes and difficulty with following commands, remains intubated. His EEG in the past 24 hours was grossly unremarkable, with normal background activity, no abnormal discharges or seizures noted.  She is on Keppra 1500 twice a day and Depakote.  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  No family at bedside.    Past Medical History: Past Medical History  Diagnosis Date  . GERD (gastroesophageal reflux disease)   . Anxiety   . Depression     Past Surgical History  Procedure Laterality Date  . No past surgeries      Family History: Family History  Problem Relation Age of Onset  . Heart disease Maternal Grandfather   . Diabetes Maternal Grandfather   . Heart disease Paternal Grandmother     Social History:   reports that he has quit smoking. His smoking use included Cigars. He has never used smokeless tobacco. He reports that he uses illicit drugs (Marijuana). He reports that he does not drink alcohol.  Allergies:  Allergies  Allergen Reactions  . Haldol [Haloperidol Lactate] Other (See Comments)    Unknown reaction [facility] lists no allergies as of 05/15/15)     Medications:                                                                                                                         Current facility-administered medications:  .  0.9 %  sodium chloride infusion, 250 mL, Intravenous, PRN, Cyril Mourning V, MD .  0.9 %  sodium chloride infusion, , Intravenous, Continuous, Lewayne Bunting, MD .  acetaminophen (TYLENOL) tablet 650 mg, 650 mg, Oral, Q4H  PRN, Cyril Mourning V, MD, 650 mg at 05/18/15 0534 .  antiseptic oral rinse solution (CORINZ), 7 mL, Mouth Rinse, 10 times per day, Oretha Milch, MD, 7 mL at 05/23/15 1421 .  ceFAZolin (ANCEF) IVPB 2 g/50 mL premix, 2 g, Intravenous, Q8H, Cyril Mourning V, MD, 2 g at 05/23/15 1420 .  chlorhexidine gluconate (PERIDEX) 0.12 % solution  15 mL, 15 mL, Mouth Rinse, BID, Cyril Mourning V, MD, 15 mL at 05/23/15 2003 .  clonazePAM (KLONOPIN) tablet 1 mg, 1 mg, Oral, QHS, Nelda Bucks, MD, 1 mg at 05/22/15 2115 .  dextrose 5 %-0.45 % sodium chloride infusion, , Intravenous, Continuous, Leslye Peer, MD, Last Rate: 10 mL/hr at 05/20/15 1900 .  [START ON 05/24/2015] DULoxetine (CYMBALTA) DR capsule 30 mg, 30 mg, Oral, Daily, Lamona Eimer Daniel Nones, MD .  feeding supplement (PRO-STAT SUGAR FREE 64) liquid 60 mL, 60 mL, Per Tube, BID, Leslye Peer, MD, 60 mL at 05/23/15 0953 .  feeding supplement (VITAL HIGH PROTEIN) liquid 1,000 mL, 1,000 mL, Per Tube, Continuous, Leslye Peer, MD, Last Rate: 50 mL/hr at 05/22/15 1440, 1,000 mL at 05/22/15 1440 .  furosemide (LASIX) injection 40 mg, 40 mg, Intravenous, Q12H, Praveen Mannam, MD, 40 mg at 05/23/15 1421 .  lactulose (CHRONULAC) 10 GM/15ML solution 20 g, 20 g, Oral, Daily, Nelda Bucks, MD, 20 g at 05/23/15 [id number] .  levETIRAcetam (KEPPRA) 500 mg in sodium chloride 0.9 % 100 mL IVPB, 500 mg, Intravenous, Q12H, Laurencia Roma Daniel Nones, MD .  LORazepam (ATIVAN) injection 1-2 mg, 1-2 mg, Intravenous, Q4H PRN, Cyril Mourning V, MD, 2 mg at 05/22/15 2131 .  neomycin-bacitracin-polymyxin (NEOSPORIN) ointment, , Topical, BID, Cyril Mourning V, MD .  pantoprazole sodium (PROTONIX) 40 mg/20 mL oral suspension 40 mg, 40 mg, Per Tube, Daily, Cyril Mourning V, MD, 40 mg at 05/23/15 0954 .  valproate (DEPACON) 500 mg in dextrose 5 % 50 mL IVPB, 500 mg, Intravenous, Q12H, Ulice Dash, PA-C, 500 mg at 05/23/15 7253   Neurologic Examination:                                                                                                       Blood pressure 109/71, pulse 85, temperature 99 F (37.2 C), temperature source Axillary, resp. rate 15, height 5\' 11"  (1.803 m), weight 119.1 kg (262 lb 9.1 oz), SpO2 100 %.  Intubated, off sedation. Does not open eyes to verbal command or tactile stimulus. But when his bilateral upper extremities are severely elevated in antigravity position, he was able to sustain them antigravity without any drift. Does not follow verbal commands. No specific gaze deviation noted. no abnormal involuntary movements noted. Rigidity noted in bilateral upper extremities, likely extrapyramidal from antipsychotic medications as previously suspected .  Deep tendon reflexes are 2+ and symmetric.    Lab Results: Basic Metabolic Panel:  Recent Labs Lab 05/17/15 0413 05/18/15 0420 05/20/15 0508 05/21/15 0346 05/23/15 0648  NA 145 143 143 146* 144  K 3.9 3.9 3.7 3.7 3.9  CL 106 106 108 105 101  CO2 31 27 26 29 29   GLUCOSE 115* 117* 114* 131* 113*  BUN 15 19 26* 30* 37*  CREATININE 0.95 0.76 0.57* 0.76 0.73  CALCIUM 8.6* 8.5* 8.5* 8.8* 8.9  MG  --   --   --  2.1  --   PHOS  --   --   --  4.2  --  Liver Function Tests:  Recent Labs Lab 05/18/15 0420 05/20/15 0508  AST 59* 55*  ALT 58 62  ALKPHOS 45 51  BILITOT 0.8 0.6  PROT 5.0* 5.2*  ALBUMIN 2.2* 2.2*   No results for input(s): LIPASE, AMYLASE in the last 168 hours.  Recent Labs Lab 05/23/15 1153  AMMONIA 38*    CBC:  Recent Labs Lab 05/17/15 0413 05/18/15 0420 05/20/15 0508 05/21/15 0346 05/23/15 0837  WBC 10.4 8.8 8.0 8.6 9.7  NEUTROABS  --   --  4.8 5.5  --   HGB 13.1 12.4* 12.9* 12.9* 13.6  HCT 39.7 38.1* 38.6* 39.7 40.9  MCV 87.3 87.0 88.7 86.9 88.3  PLT 66* 75* 96* 123* 157    Cardiac Enzymes:  Recent Labs Lab 05/18/15 0420  CKTOTAL 1235*    Lipid Panel:  Recent Labs Lab 05/17/15 0413 05/20/15 0508 05/23/15 0648  TRIG 101 91 98     CBG:  Recent Labs Lab 05/23/15 0402 05/23/15 0739 05/23/15 1121 05/23/15 1537 05/23/15 1943  GLUCAP 117* 116* 106* 112* 108*    Microbiology: Results for orders placed or performed during the hospital encounter of 05/15/15  MRSA PCR Screening     Status: None   Collection Time: 05/15/15  3:49 PM  Result Value Ref Range Status   MRSA by PCR NEGATIVE NEGATIVE Final    Comment:        The GeneXpert MRSA Assay (FDA approved for NASAL specimens only), is one component of a comprehensive MRSA colonization surveillance program. It is not intended to diagnose MRSA infection nor to guide or monitor treatment for MRSA infections.   Culture, blood (routine x 2)     Status: None   Collection Time: 05/15/15  9:53 PM  Result Value Ref Range Status   Specimen Description BLOOD RIGHT ANTECUBITAL  Final   Special Requests BOTTLES DRAWN AEROBIC ONLY 5CC  Final   Culture NO GROWTH 5 DAYS  Final   Report Status 05/20/2015 FINAL  Final  Culture, blood (routine x 2)     Status: None   Collection Time: 05/15/15 10:06 PM  Result Value Ref Range Status   Specimen Description BLOOD RIGHT ANTECUBITAL  Final   Special Requests BOTTLES DRAWN AEROBIC ONLY 5CC  Final   Culture NO GROWTH 5 DAYS  Final   Report Status 05/20/2015 FINAL  Final  CSF culture     Status: None   Collection Time: 05/17/15  3:01 PM  Result Value Ref Range Status   Specimen Description CSF  Final   Special Requests NONE  Final   Gram Stain   Final    CYTOSPIN SMEAR WBC PRESENT, PREDOMINANTLY MONONUCLEAR NO ORGANISMS SEEN    Culture NO GROWTH 3 DAYS  Final   Report Status 05/20/2015 FINAL  Final  Culture, blood (Routine X 2) w Reflex to ID Panel     Status: None   Collection Time: 05/18/15 10:10 AM  Result Value Ref Range Status   Specimen Description BLOOD RIGHT HAND  Final   Special Requests IN PEDIATRIC BOTTLE 3CC  Final   Culture NO GROWTH 5 DAYS  Final   Report Status 05/23/2015 FINAL  Final  Culture,  blood (Routine X 2) w Reflex to ID Panel     Status: None   Collection Time: 05/18/15 10:15 AM  Result Value Ref Range Status   Specimen Description BLOOD LEFT HAND  Final   Special Requests IN PEDIATRIC BOTTLE 3CC  Final   Culture NO GROWTH  5 DAYS  Final   Report Status 05/23/2015 FINAL  Final    Imaging: Mr Brain Wo Contrast  05/23/2015  CLINICAL DATA:  25 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 [facility]. Benzodiazepine withdrawal. Subsequent encounter. EXAM: MRI HEAD WITHOUT CONTRAST TECHNIQUE: Multiplanar, multiecho pulse sequences of the brain and surrounding structures were obtained without intravenous contrast. COMPARISON:  05/16/2015 and 04/30/2015 MR. 05/15/2015 and 12/10/2009 CT. FINDINGS: No acute infarct or intracranial hemorrhage. No hydrocephalus or advanced atrophy. Small left choroidal fissure cyst incidentally noted. No evidence of mesial temporal sclerosis. No intracranial mass lesion noted on this unenhanced exam. Major intracranial vascular structures are patent. Minimal partial opacification left mastoid air cells without obstructing lesion of the eustachian tube noted. Polypoid structure right maxillary sinus spanning over 2 cm may be a retention cyst and without change. Cervical medullary junction, pituitary region, pineal region and orbital structures unremarkable. IMPRESSION: No acute intracranial abnormality noted on this unenhanced exam as noted above. Electronically Signed   By: Lacy Duverney M.D.   On: 05/23/2015 18:50   Mr Cervical Spine Wo Contrast  05/23/2015  CLINICAL DATA:  25 year old male with depression with subsequent catatonic state. Possible benzodiazepine withdrawal. Possible seizure. Subsequent encounter. EXAM: MRI CERVICAL AND THORACIC SPINE WITHOUT CONTRAST TECHNIQUE: Multiplanar and multiecho pulse sequences of the cervical spine, to include the craniocervical junction and cervicothoracic junction, and thoracic  spine, were obtained without intravenous contrast. COMPARISON:  No comparison cervical thoracic spine MR. Three recent brain MRs most recent performed just before cervical and thoracic spine MR and dictated separately. FINDINGS: MRI CERVICAL SPINE FINDINGS Patient is intubated with nasogastric tube in place. Cervical medullary junction unremarkable. No focal cervical cord signal abnormality. Edema posterior paraspinal musculature of questionable etiology. No osseous edema to suggest acute osseous injury. Pooling of secretions. Additionally, minimal amount of retropharyngeal edema. Etiology indeterminate. C2-3:  Negative. C3-4:  Negative. C4-5:  Negative. C5-6: Tiny right paracentral protrusion C6-7:  Very small left paracentral protrusion. C7-T1: Negative. MRI THORACIC SPINE FINDINGS Exam is motion degraded. Nonspecific posterior paravertebral edema extends into the upper thoracic region. No definitive focal thoracic cord signal abnormality noted. T1-2:  Negative. T2-3: Shallow left posterior lateral protrusion with slight cephalad extension with mild flattening left aspect of the cord. T3-4: Small left paracentral protrusion mild flattening left aspect of the cord. T4-5: Tiny left paracentral protrusion/ bulge with mild narrowing ventral thecal sac without cord flattening. T5-6: Moderate size focal right paracentral protrusion with flattening right aspect of the cord. T6-7:  Bulge.  Mild narrowing ventral thecal sac. T7-8: Minimal to mild bulge greater to left. Mild narrowing ventral thecal sac. T8-9: Small left paracentral protrusion with mild left-sided cord flattening. T9-10:  Negative. T10-11: Negative. T11-12: Negative. T12-L1: Negative. IMPRESSION: MRI CERVICAL No focal cervical cord signal abnormality. Edema posterior paraspinal musculature of questionable etiology. Pooling of secretions. Additionally, minimal amount of retropharyngeal edema. Etiology indeterminate. C5-6 tiny right paracentral protrusion  C6-7 very small left paracentral protrusion. MRI THORACIC SPINE Exam is motion degraded. Nonspecific posterior paravertebral edema extends into the upper thoracic region. No definitive focal thoracic cord signal abnormality noted. Several disc protrusions largest on the right at the T5-6 level. Summary of pertinent findings includes: T2-3 shallow left posterior lateral protrusion with slight cephalad extension with mild flattening left aspect of the cord. T3-4 small left paracentral protrusion with mild flattening left aspect of the cord. T4-5 tiny left paracentral protrusion/ bulge with mild narrowing ventral thecal sac without cord flattening. T5-6  moderate size focal right paracentral protrusion with flattening right aspect of the cord. T6-7 bulge.  Mild narrowing ventral thecal sac. T7-8 minimal to mild bulge greater to left. Mild narrowing ventral thecal sac. T8-9 small left paracentral protrusion with mild left-sided cord flattening. Electronically Signed   By: Lacy Duverney M.D.   On: 05/23/2015 19:13   Mr Thoracic Spine Wo Contrast  05/23/2015  CLINICAL DATA:  25 year old male with depression with subsequent catatonic state. Possible benzodiazepine withdrawal. Possible seizure. Subsequent encounter. EXAM: MRI CERVICAL AND THORACIC SPINE WITHOUT CONTRAST TECHNIQUE: Multiplanar and multiecho pulse sequences of the cervical spine, to include the craniocervical junction and cervicothoracic junction, and thoracic spine, were obtained without intravenous contrast. COMPARISON:  No comparison cervical thoracic spine MR. Three recent brain MRs most recent performed just before cervical and thoracic spine MR and dictated separately. FINDINGS: MRI CERVICAL SPINE FINDINGS Patient is intubated with nasogastric tube in place. Cervical medullary junction unremarkable. No focal cervical cord signal abnormality. Edema posterior paraspinal musculature of questionable etiology. No osseous edema to suggest acute osseous  injury. Pooling of secretions. Additionally, minimal amount of retropharyngeal edema. Etiology indeterminate. C2-3:  Negative. C3-4:  Negative. C4-5:  Negative. C5-6: Tiny right paracentral protrusion C6-7:  Very small left paracentral protrusion. C7-T1: Negative. MRI THORACIC SPINE FINDINGS Exam is motion degraded. Nonspecific posterior paravertebral edema extends into the upper thoracic region. No definitive focal thoracic cord signal abnormality noted. T1-2:  Negative. T2-3: Shallow left posterior lateral protrusion with slight cephalad extension with mild flattening left aspect of the cord. T3-4: Small left paracentral protrusion mild flattening left aspect of the cord. T4-5: Tiny left paracentral protrusion/ bulge with mild narrowing ventral thecal sac without cord flattening. T5-6: Moderate size focal right paracentral protrusion with flattening right aspect of the cord. T6-7:  Bulge.  Mild narrowing ventral thecal sac. T7-8: Minimal to mild bulge greater to left. Mild narrowing ventral thecal sac. T8-9: Small left paracentral protrusion with mild left-sided cord flattening. T9-10:  Negative. T10-11: Negative. T11-12: Negative. T12-L1: Negative. IMPRESSION: MRI CERVICAL No focal cervical cord signal abnormality. Edema posterior paraspinal musculature of questionable etiology. Pooling of secretions. Additionally, minimal amount of retropharyngeal edema. Etiology indeterminate. C5-6 tiny right paracentral protrusion C6-7 very small left paracentral protrusion. MRI THORACIC SPINE Exam is motion degraded. Nonspecific posterior paravertebral edema extends into the upper thoracic region. No definitive focal thoracic cord signal abnormality noted. Several disc protrusions largest on the right at the T5-6 level. Summary of pertinent findings includes: T2-3 shallow left posterior lateral protrusion with slight cephalad extension with mild flattening left aspect of the cord. T3-4 small left paracentral protrusion with  mild flattening left aspect of the cord. T4-5 tiny left paracentral protrusion/ bulge with mild narrowing ventral thecal sac without cord flattening. T5-6 moderate size focal right paracentral protrusion with flattening right aspect of the cord. T6-7 bulge.  Mild narrowing ventral thecal sac. T7-8 minimal to mild bulge greater to left. Mild narrowing ventral thecal sac. T8-9 small left paracentral protrusion with mild left-sided cord flattening. Electronically Signed   By: Lacy Duverney M.D.   On: 05/23/2015 19:13   Dg Chest Port 1 View  05/23/2015  CLINICAL DATA:  Hypoxia EXAM: PORTABLE CHEST 1 VIEW COMPARISON:  May 21, 2015 FINDINGS: Endotracheal tube tip is 2.0 cm above the carina. Nasogastric tube tip and side port are in the stomach. No pneumothorax. There is patchy atelectasis in the left base. The lungs elsewhere clear. Heart is borderline enlarged with pulmonary vascularity within normal limits.  No adenopathy. No bone lesions. IMPRESSION: Tube positions as described without pneumothorax. Left base atelectasis. Lungs elsewhere clear. No change in cardiac silhouette. Electronically Signed   By: Bretta Bang III M.D.   On: 05/23/2015 07:42   Dg Chest Port 1 View  05/21/2015  CLINICAL DATA:  Acute respiratory failure EXAM: PORTABLE CHEST 1 VIEW COMPARISON:  May 20, 2015 FINDINGS: The ETT terminates 16 mm above the carina. Recommend withdrawing 1 cm. No pneumothorax. No other interval changes. IMPRESSION: Low-lying ETT, 16 mm above the carina. Recommend withdrawing 1 cm. No other interval change. Electronically Signed   By: Gerome Sam III M.D   On: 05/21/2015 07:24   Dg Chest Port 1 View  05/20/2015  CLINICAL DATA:  Seizure. EXAM: PORTABLE CHEST 1 VIEW COMPARISON:  05/17/2015. FINDINGS: Endotracheal tube, NG tube in stable position. Low lung volumes with progressive basilar atelectasis and/or infiltrates. Small left pleural effusion. No pneumothorax. Heart size stable No pulmonary  venous congestion. No pneumothorax. IMPRESSION: 1. Lines and tubes in stable position. 2. Low lung volumes with progressive bibasilar atelectasis and/or infiltrates. Small left pleural effusion. Electronically Signed   By: Maisie Fus  Register   On: 05/20/2015 08:00    Assessment and plan:   JAYSTEN ESSNER is an 25 y.o. male patient remains intubated in ICU, off sedation. He was able to sustain bilateral upper extremities in antigravity position without a drift although he does not open eyes or follows commands to verbal commands. His EEG in the past 24 hours showed normal background activity with no abnormal discharges or seizures.  History this is planned, recommend MRI of the cervical and thoracic spine to rule out acute spinal cord pathology, especially given the rigidity noted in bilateral upper extremities. At the same time we'll also obtain a brain MRI study for follow-up to rule out any intracranial pathology should be contributing to his difficulty with following commands.  MRI studies have been completed. No acute intracranial or spinal cord pathologies are seen. Posterior paravertebral muscle edema is noted on spine MRI studies which is nonspecific.  This is his third brain MRI study since admission, which has been unremarkable.   Recommend to reduce the Keppra dose to 500 mg twice a day, and Cymbalta dose reduced to 30 mg daily, continue Depakote same dose 500 mg twice a day. Planned extubation tomorrow.  We'll continue to follow up. Please call for any further questions.

## 2015-05-24 ENCOUNTER — Inpatient Hospital Stay (HOSPITAL_COMMUNITY): Payer: BLUE CROSS/BLUE SHIELD

## 2015-05-24 LAB — BASIC METABOLIC PANEL
ANION GAP: 12 (ref 5–15)
BUN: 40 mg/dL — AB (ref 6–20)
CALCIUM: 9.2 mg/dL (ref 8.9–10.3)
CO2: 31 mmol/L (ref 22–32)
Chloride: 105 mmol/L (ref 101–111)
Creatinine, Ser: 0.76 mg/dL (ref 0.61–1.24)
GFR calc Af Amer: >60 mL/min (ref >60)
Glucose, Bld: 144 mg/dL — ABNORMAL HIGH (ref 65–99)
POTASSIUM: 3.9 mmol/L (ref 3.5–5.1)
SODIUM: 148 mmol/L — AB (ref 135–145)

## 2015-05-24 LAB — PHOSPHORUS: Phosphorus: 3.9 mg/dL (ref 2.5–4.6)

## 2015-05-24 LAB — BLOOD GAS, ARTERIAL
Acid-Base Excess: 9.6 mmol/L — ABNORMAL HIGH (ref 0.0–2.0)
BICARBONATE: 33.6 meq/L — AB (ref 20.0–24.0)
DRAWN BY: 362771
FIO2: 0.4
O2 SAT: 99 %
PATIENT TEMPERATURE: 100.1
PCO2 ART: 47.2 mmHg — AB (ref 35.0–45.0)
PEEP: 5 cmH2O
PH ART: 7.471 — AB (ref 7.350–7.450)
RATE: 15 resp/min
TCO2: 35 mmol/L (ref 0–100)
VT: 600 mL
pO2, Arterial: 156 mmHg — ABNORMAL HIGH (ref 80.0–100.0)

## 2015-05-24 LAB — GLUCOSE, CAPILLARY
GLUCOSE-CAPILLARY: 129 mg/dL — AB (ref 65–99)
GLUCOSE-CAPILLARY: 105 mg/dL — AB (ref 65–99)
GLUCOSE-CAPILLARY: 108 mg/dL — AB (ref 65–99)
GLUCOSE-CAPILLARY: 118 mg/dL — AB (ref 65–99)
Glucose-Capillary: 109 mg/dL — ABNORMAL HIGH (ref 65–99)
Glucose-Capillary: 141 mg/dL — ABNORMAL HIGH (ref 65–99)

## 2015-05-24 LAB — CBC
HCT: 40.3 % (ref 39.0–52.0)
Hemoglobin: 13 g/dL (ref 13.0–17.0)
MCH: 28.3 pg (ref 26.0–34.0)
MCHC: 32.3 g/dL (ref 30.0–36.0)
MCV: 87.8 fL (ref 78.0–100.0)
PLATELETS: 165 10*3/uL (ref 150–400)
RBC: 4.59 MIL/uL (ref 4.22–5.81)
RDW: 13.6 % (ref 11.5–15.5)
WBC: 9.9 10*3/uL (ref 4.0–10.5)

## 2015-05-24 LAB — MAGNESIUM: MAGNESIUM: 2.1 mg/dL (ref 1.7–2.4)

## 2015-05-24 MED ORDER — VITAL AF 1.2 CAL PO LIQD
1000.0000 mL | ORAL | Status: DC
Start: 2015-05-24 — End: 2015-06-06
  Administered 2015-05-24 – 2015-06-06 (×16): 1000 mL
  Filled 2015-05-24 (×26): qty 1000

## 2015-05-24 NOTE — Progress Notes (Signed)
[name] has served paperwork to [employer] to notify Sgt. [name] [phone]) and [facility], Chase Hampton [phone]) of any medical changes.  This includes, diagnosis changes, patient being transferred to another unit or facility or if there are plans to discharge.  Please notify them prior to transfers being made.  Parents are allowed to visit per our visiting policy.  Patient is not to have access to a phone. Patient is not allowed to leave AMA per [name], [facility] [employer].

## 2015-05-24 NOTE — Progress Notes (Signed)
Interval History:                                                                                                                      Chase Hampton is an 25 y.o. male patient admitted with altered mental status as described in previous neurology notes. His long-term EEG has remained normal yesterday . EEG was discontinued after that. He has been extubated this morning. He is following commands appropriately.   Past Medical History: Past Medical History  Diagnosis Date  . GERD (gastroesophageal reflux disease)   . Anxiety   . Depression     Past Surgical History  Procedure Laterality Date  . No past surgeries      Family History: Family History  Problem Relation Age of Onset  . Heart disease Maternal Grandfather   . Diabetes Maternal Grandfather   . Heart disease Paternal Grandmother     Social History:   reports that he has quit smoking. His smoking use included Cigars. He has never used smokeless tobacco. He reports that he uses illicit drugs (Marijuana). He reports that he does not drink alcohol.  Allergies:  Allergies  Allergen Reactions  . Haldol [Haloperidol Lactate] Other (See Comments)    Unknown reaction [facility] lists no allergies as of 05/15/15)     Medications:                                                                                                                         Current facility-administered medications:  .  0.9 %  sodium chloride infusion, 250 mL, Intravenous, PRN, Cyril Mourning V, MD .  0.9 %  sodium chloride infusion, , Intravenous, Continuous, Lewayne Bunting, MD .  acetaminophen (TYLENOL) tablet 650 mg, 650 mg, Oral, Q4H PRN, Cyril Mourning V, MD, 650 mg at 05/18/15 0534 .  antiseptic oral rinse solution (CORINZ), 7 mL, Mouth Rinse, 10 times per day, Oretha Milch, MD, 7 mL at 05/24/15 0624 .  ceFAZolin (ANCEF) IVPB 2 g/50 mL premix, 2 g, Intravenous, Q8H, Cyril Mourning V, MD, 2 g at 05/24/15 1610 .  chlorhexidine gluconate  (PERIDEX) 0.12 % solution 15 mL, 15 mL, Mouth Rinse, BID, Cyril Mourning V, MD, 15 mL at 05/24/15 0856 .  clonazePAM (KLONOPIN) tablet 1 mg, 1 mg, Oral, QHS, Nelda Bucks, MD, 1 mg at 05/23/15 2234 .  dextrose 5 %-0.45 % sodium chloride infusion, , Intravenous, Continuous, Leslye Peer, MD, Last Rate: 10 mL/hr  at 05/24/15 0300 .  DULoxetine (CYMBALTA) DR capsule 30 mg, 30 mg, Oral, Daily, Atreyu Mak Daniel Nones, MD, 30 mg at 05/24/15 2956 .  feeding supplement (PRO-STAT SUGAR FREE 64) liquid 60 mL, 60 mL, Per Tube, BID, Leslye Peer, MD, 60 mL at 05/24/15 0900 .  feeding supplement (VITAL HIGH PROTEIN) liquid 1,000 mL, 1,000 mL, Per Tube, Continuous, Leslye Peer, MD, Last Rate: 50 mL/hr at 05/22/15 1440, 1,000 mL at 05/22/15 1440 .  lactulose (CHRONULAC) 10 GM/15ML solution 20 g, 20 g, Oral, Daily, Nelda Bucks, MD, 20 g at 05/23/15 [phone]  levETIRAcetam (KEPPRA) 500 mg in sodium chloride 0.9 % 100 mL IVPB, 500 mg, Intravenous, Q12H, Hillari Zumwalt Daniel Nones, MD, 500 mg at 05/23/15 2300 .  LORazepam (ATIVAN) injection 1-2 mg, 1-2 mg, Intravenous, Q4H PRN, Cyril Mourning V, MD, 2 mg at 05/22/15 2131 .  neomycin-bacitracin-polymyxin (NEOSPORIN) ointment, , Topical, BID, Oretha Milch, MD, 1 application at 05/24/15 0910 .  pantoprazole sodium (PROTONIX) 40 mg/20 mL oral suspension 40 mg, 40 mg, Per Tube, Daily, Cyril Mourning V, MD, 40 mg at 05/24/15 0900 .  valproate (DEPACON) 500 mg in dextrose 5 % 50 mL IVPB, 500 mg, Intravenous, Q12H, Ulice Dash, PA-C, 500 mg at 05/23/15 2300   Neurologic Examination:                                                                                                      Blood pressure 132/75, pulse 111, temperature 100.4 F (38 C), temperature source Axillary, resp. rate 23, height 5\' 11"  (1.803 m), weight 116.5 kg (256 lb 13.4 oz), SpO2 99 %.  Evaluation of higher integrative functions including: Level of alertness: Drowsy, opens eyes to verbal  command   Speech: Nonverbal, but follows commands appropriately  Test the following cranial nerves: 2-12 grossly intact Motor examination: Increased tone in bilateral upper extremities with rigidity noted , able to demonstrate symmetric and sustained antigravity strength in all 4 extremities, with no drift  Examination of sensation :  symmetric sensation in all 4 extremities and on face Examination of deep tendon reflexes: 2+, normal and symmetric in all extremities, normal plantars bilaterally Test coordination:  no abnormal involuntary movements or tremors noted.  Gait: Deferred   Lab Results: Basic Metabolic Panel:  Recent Labs Lab 05/18/15 0420 05/20/15 0508 05/21/15 0346 05/23/15 0648 05/24/15 0319  NA 143 143 146* 144 148*  K 3.9 3.7 3.7 3.9 3.9  CL 106 108 105 101 105  CO2 27 26 29 29 31   GLUCOSE 117* 114* 131* 113* 144*  BUN 19 26* 30* 37* 40*  CREATININE 0.76 0.57* 0.76 0.73 0.76  CALCIUM 8.5* 8.5* 8.8* 8.9 9.2  MG  --   --  2.1  --  2.1  PHOS  --   --  4.2  --  3.9    Liver Function Tests:  Recent Labs Lab 05/18/15 0420 05/20/15 0508  AST 59* 55*  ALT 58 62  ALKPHOS 45 51  BILITOT 0.8 0.6  PROT 5.0* 5.2*  ALBUMIN  2.2* 2.2*   No results for input(s): LIPASE, AMYLASE in the last 168 hours.  Recent Labs Lab 05/23/15 1153  AMMONIA 38*    CBC:  Recent Labs Lab 05/18/15 0420 05/20/15 0508 05/21/15 0346 05/23/15 0837 05/24/15 0319  WBC 8.8 8.0 8.6 9.7 9.9  NEUTROABS  --  4.8 5.5  --   --   HGB 12.4* 12.9* 12.9* 13.6 13.0  HCT 38.1* 38.6* 39.7 40.9 40.3  MCV 87.0 88.7 86.9 88.3 87.8  PLT 75* 96* 123* 157 165    Cardiac Enzymes:  Recent Labs Lab 05/18/15 0420  CKTOTAL 1235*    Lipid Panel:  Recent Labs Lab 05/20/15 0508 05/23/15 0648  TRIG 91 98    CBG:  Recent Labs Lab 05/23/15 1537 05/23/15 1943 05/23/15 2327 05/24/15 0332 05/24/15 0740  GLUCAP 112* 108* 111* 129* 141*    Microbiology: Results for orders placed or  performed during the hospital encounter of 05/15/15  MRSA PCR Screening     Status: None   Collection Time: 05/15/15  3:49 PM  Result Value Ref Range Status   MRSA by PCR NEGATIVE NEGATIVE Final    Comment:        The GeneXpert MRSA Assay (FDA approved for NASAL specimens only), is one component of a comprehensive MRSA colonization surveillance program. It is not intended to diagnose MRSA infection nor to guide or monitor treatment for MRSA infections.   Culture, blood (routine x 2)     Status: None   Collection Time: 05/15/15  9:53 PM  Result Value Ref Range Status   Specimen Description BLOOD RIGHT ANTECUBITAL  Final   Special Requests BOTTLES DRAWN AEROBIC ONLY 5CC  Final   Culture NO GROWTH 5 DAYS  Final   Report Status 05/20/2015 FINAL  Final  Culture, blood (routine x 2)     Status: None   Collection Time: 05/15/15 10:06 PM  Result Value Ref Range Status   Specimen Description BLOOD RIGHT ANTECUBITAL  Final   Special Requests BOTTLES DRAWN AEROBIC ONLY 5CC  Final   Culture NO GROWTH 5 DAYS  Final   Report Status 05/20/2015 FINAL  Final  CSF culture     Status: None   Collection Time: 05/17/15  3:01 PM  Result Value Ref Range Status   Specimen Description CSF  Final   Special Requests NONE  Final   Gram Stain   Final    CYTOSPIN SMEAR WBC PRESENT, PREDOMINANTLY MONONUCLEAR NO ORGANISMS SEEN    Culture NO GROWTH 3 DAYS  Final   Report Status 05/20/2015 FINAL  Final  Culture, blood (Routine X 2) w Reflex to ID Panel     Status: None   Collection Time: 05/18/15 10:10 AM  Result Value Ref Range Status   Specimen Description BLOOD RIGHT HAND  Final   Special Requests IN PEDIATRIC BOTTLE 3CC  Final   Culture NO GROWTH 5 DAYS  Final   Report Status 05/23/2015 FINAL  Final  Culture, blood (Routine X 2) w Reflex to ID Panel     Status: None   Collection Time: 05/18/15 10:15 AM  Result Value Ref Range Status   Specimen Description BLOOD LEFT HAND  Final   Special  Requests IN PEDIATRIC BOTTLE 3CC  Final   Culture NO GROWTH 5 DAYS  Final   Report Status 05/23/2015 FINAL  Final    Imaging: Mr Brain Wo Contrast  05/23/2015  CLINICAL DATA:  25 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 [facility]iz[facility]. Benzodiazepine withdrawal. Subsequent encounter. EXAM: MRI HEAD WITHOUT CONTRAST TECHNIQUE: Multiplanar, multiecho pulse sequences of the brain and surrounding structures were obtained without intravenous contrast. COMPARISON:  05/16/2015 and 04/30/2015 MR. 05/15/2015 and 12/10/2009 CT. FINDINGS: No acute infarct or intracranial hemorrhage. No hydrocephalus or advanced atrophy. Small left choroidal fissure cyst incidentally noted. No evidence of mesial temporal sclerosis. No intracranial mass lesion noted on this unenhanced exam. Major intracranial vascular structures are patent. Minimal partial opacification left mastoid air cells without obstructing lesion of the eustachian tube noted. Polypoid structure right maxillary sinus spanning over 2 cm may be a retention cyst and without change. Cervical medullary junction, pituitary region, pineal region and orbital structures unremarkable. IMPRESSION: No acute intracranial abnormality noted on this unenhanced exam as noted above. Electronically Signed   By: Lacy Duverney M.D.   On: 05/23/2015 18:50   Mr Cervical Spine Wo Contrast  05/23/2015  CLINICAL DATA:  26 year old male with depression with subsequent catatonic state. Possible benzodiazepine withdrawal. Possible seizure. Subsequent encounter. EXAM: MRI CERVICAL AND THORACIC SPINE WITHOUT CONTRAST TECHNIQUE: Multiplanar and multiecho pulse sequences of the cervical spine, to include the craniocervical junction and cervicothoracic junction, and thoracic spine, were obtained without intravenous contrast. COMPARISON:  No comparison cervical thoracic spine MR. Three recent brain MRs most recent performed just before cervical and thoracic  spine MR and dictated separately. FINDINGS: MRI CERVICAL SPINE FINDINGS Patient is intubated with nasogastric tube in place. Cervical medullary junction unremarkable. No focal cervical cord signal abnormality. Edema posterior paraspinal musculature of questionable etiology. No osseous edema to suggest acute osseous injury. Pooling of secretions. Additionally, minimal amount of retropharyngeal edema. Etiology indeterminate. C2-3:  Negative. C3-4:  Negative. C4-5:  Negative. C5-6: Tiny right paracentral protrusion C6-7:  Very small left paracentral protrusion. C7-T1: Negative. MRI THORACIC SPINE FINDINGS Exam is motion degraded. Nonspecific posterior paravertebral edema extends into the upper thoracic region. No definitive focal thoracic cord signal abnormality noted. T1-2:  Negative. T2-3: Shallow left posterior lateral protrusion with slight cephalad extension with mild flattening left aspect of the cord. T3-4: Small left paracentral protrusion mild flattening left aspect of the cord. T4-5: Tiny left paracentral protrusion/ bulge with mild narrowing ventral thecal sac without cord flattening. T5-6: Moderate size focal right paracentral protrusion with flattening right aspect of the cord. T6-7:  Bulge.  Mild narrowing ventral thecal sac. T7-8: Minimal to mild bulge greater to left. Mild narrowing ventral thecal sac. T8-9: Small left paracentral protrusion with mild left-sided cord flattening. T9-10:  Negative. T10-11: Negative. T11-12: Negative. T12-L1: Negative. IMPRESSION: MRI CERVICAL No focal cervical cord signal abnormality. Edema posterior paraspinal musculature of questionable etiology. Pooling of secretions. Additionally, minimal amount of retropharyngeal edema. Etiology indeterminate. C5-6 tiny right paracentral protrusion C6-7 very small left paracentral protrusion. MRI THORACIC SPINE Exam is motion degraded. Nonspecific posterior paravertebral edema extends into the upper thoracic region. No definitive  focal thoracic cord signal abnormality noted. Several disc protrusions largest on the right at the T5-6 level. Summary of pertinent findings includes: T2-3 shallow left posterior lateral protrusion with slight cephalad extension with mild flattening left aspect of the cord. T3-4 small left paracentral protrusion with mild flattening left aspect of the cord. T4-5 tiny left paracentral protrusion/ bulge with mild narrowing ventral thecal sac without cord flattening. T5-6 moderate size focal right paracentral protrusion with flattening right aspect of the cord. T6-7 bulge.  Mild narrowing ventral thecal sac. T7-8 minimal to mild bulge greater to left. Mild narrowing ventral thecal sac. T8-9 small  left paracentral protrusion with mild left-sided cord flattening. Electronically Signed   By: Lacy Duverney M.D.   On: 05/23/2015 19:13   Mr Thoracic Spine Wo Contrast  05/23/2015  CLINICAL DATA:  24 year old male with depression with subsequent catatonic state. Possible benzodiazepine withdrawal. Possible seizure. Subsequent encounter. EXAM: MRI CERVICAL AND THORACIC SPINE WITHOUT CONTRAST TECHNIQUE: Multiplanar and multiecho pulse sequences of the cervical spine, to include the craniocervical junction and cervicothoracic junction, and thoracic spine, were obtained without intravenous contrast. COMPARISON:  No comparison cervical thoracic spine MR. Three recent brain MRs most recent performed just before cervical and thoracic spine MR and dictated separately. FINDINGS: MRI CERVICAL SPINE FINDINGS Patient is intubated with nasogastric tube in place. Cervical medullary junction unremarkable. No focal cervical cord signal abnormality. Edema posterior paraspinal musculature of questionable etiology. No osseous edema to suggest acute osseous injury. Pooling of secretions. Additionally, minimal amount of retropharyngeal edema. Etiology indeterminate. C2-3:  Negative. C3-4:  Negative. C4-5:  Negative. C5-6: Tiny right paracentral  protrusion C6-7:  Very small left paracentral protrusion. C7-T1: Negative. MRI THORACIC SPINE FINDINGS Exam is motion degraded. Nonspecific posterior paravertebral edema extends into the upper thoracic region. No definitive focal thoracic cord signal abnormality noted. T1-2:  Negative. T2-3: Shallow left posterior lateral protrusion with slight cephalad extension with mild flattening left aspect of the cord. T3-4: Small left paracentral protrusion mild flattening left aspect of the cord. T4-5: Tiny left paracentral protrusion/ bulge with mild narrowing ventral thecal sac without cord flattening. T5-6: Moderate size focal right paracentral protrusion with flattening right aspect of the cord. T6-7:  Bulge.  Mild narrowing ventral thecal sac. T7-8: Minimal to mild bulge greater to left. Mild narrowing ventral thecal sac. T8-9: Small left paracentral protrusion with mild left-sided cord flattening. T9-10:  Negative. T10-11: Negative. T11-12: Negative. T12-L1: Negative. IMPRESSION: MRI CERVICAL No focal cervical cord signal abnormality. Edema posterior paraspinal musculature of questionable etiology. Pooling of secretions. Additionally, minimal amount of retropharyngeal edema. Etiology indeterminate. C5-6 tiny right paracentral protrusion C6-7 very small left paracentral protrusion. MRI THORACIC SPINE Exam is motion degraded. Nonspecific posterior paravertebral edema extends into the upper thoracic region. No definitive focal thoracic cord signal abnormality noted. Several disc protrusions largest on the right at the T5-6 level. Summary of pertinent findings includes: T2-3 shallow left posterior lateral protrusion with slight cephalad extension with mild flattening left aspect of the cord. T3-4 small left paracentral protrusion with mild flattening left aspect of the cord. T4-5 tiny left paracentral protrusion/ bulge with mild narrowing ventral thecal sac without cord flattening. T5-6 moderate size focal right  paracentral protrusion with flattening right aspect of the cord. T6-7 bulge.  Mild narrowing ventral thecal sac. T7-8 minimal to mild bulge greater to left. Mild narrowing ventral thecal sac. T8-9 small left paracentral protrusion with mild left-sided cord flattening. Electronically Signed   By: Lacy Duverney M.D.   On: 05/23/2015 19:13   Dg Chest Port 1 View  05/24/2015  CLINICAL DATA:  25 year old male status post intubation. EXAM: PORTABLE CHEST 1 VIEW COMPARISON:  Multiple priors, most recently 05/23/2015. FINDINGS: An endotracheal tube is in place with tip 3.7 cm above the carina. Nasogastric tube extends into the antrum of the stomach. Lung volumes are low. No consolidative airspace disease. No pleural effusions. No pneumothorax. No pulmonary nodule or mass noted. Pulmonary vasculature and the cardiomediastinal silhouette are within normal limits. IMPRESSION: 1. Support apparatus, as above. 2. Low lung volumes without radiographic evidence of acute cardiopulmonary disease. Electronically Signed   By:  Trudie Reed M.D.   On: 05/24/2015 07:46   Dg Chest Port 1 View  05/23/2015  CLINICAL DATA:  Hypoxia EXAM: PORTABLE CHEST 1 VIEW COMPARISON:  May 21, 2015 FINDINGS: Endotracheal tube tip is 2.0 cm above the carina. Nasogastric tube tip and side port are in the stomach. No pneumothorax. There is patchy atelectasis in the left base. The lungs elsewhere clear. Heart is borderline enlarged with pulmonary vascularity within normal limits. No adenopathy. No bone lesions. IMPRESSION: Tube positions as described without pneumothorax. Left base atelectasis. Lungs elsewhere clear. No change in cardiac silhouette. Electronically Signed   By: Bretta Bang III M.D.   On: 05/23/2015 07:42   Dg Chest Port 1 View  05/21/2015  CLINICAL DATA:  Acute respiratory failure EXAM: PORTABLE CHEST 1 VIEW COMPARISON:  May 20, 2015 FINDINGS: The ETT terminates 16 mm above the carina. Recommend withdrawing 1 cm. No  pneumothorax. No other interval changes. IMPRESSION: Low-lying ETT, 16 mm above the carina. Recommend withdrawing 1 cm. No other interval change. Electronically Signed   By: Gerome Sam III M.D   On: 05/21/2015 07:24    Assessment and plan:   Chase Hampton is an 25 y.o. male patient with significant psychiatric comorbidity, admitted with altered mental status in the ICU intubated as described in the previous notes. He is extubated this morning and is doing well, follows commands appropriately. No focal neurological deficits noted. There is mild rigidity in bilateral upper extremities, believed to be secondary to antipsychotic medication use. We'll discontinue Keppra at this time. Is no clear evidence for seizures or abnormal discharges on long-term EEG monitoring throughout this admission. He is also on Depakote which we can  continue as mood stabilizer. Recommend physical therapy and occupational therapy to improve mobility. Based on improvement, may consider a small dose of scheduled clonazepam in future to help with the increased tone in his upper extremities. Discussed his neurologic assessment with his mother in the waiting room. Answered several of her questions to her satisfaction.  Discussed the case with ccu attending, Dr.Yacoub.  We'll follow-up

## 2015-05-24 NOTE — Progress Notes (Signed)
Nutrition Follow-up  DOCUMENTATION CODES:   Obesity unspecified  INTERVENTION:   Initiate Vital AF 1.2 @ 80 ml/hr via Cortrak   Tube feeding regimen provides 2304 kcal, 144 grams of protein, and 1557 ml of H2O.   NUTRITION DIAGNOSIS:   Inadequate oral intake related to inability to eat as evidenced by NPO status. Ongoing.   GOAL:   Patient will meet greater than or equal to 90% of their needs Progressing.   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 Pt was transferred from [facility] to [place] [facility] where he refused to eat/drink or take meds. Pt noted to have seizure and brought to [facility] where he suffered another seizure and was intubated.   2/28 extubated, Cortrak placed (4th portion of duodenum/Ligament of Treitz) Cortrak placed by this clinician and noted no movement during tube placement.  Neurology following CBG's: 105-141 Labs reviewed: sodium elevated 148  Diet Order:  Diet NPO time specified  Skin:  Wound (see comment) (Neck with rash and blister)  Last BM:  2/25  Height:   Ht Readings from Last 1 Encounters:  05/15/15  (1.803 m)    Weight:   Wt Readings from Last 1 Encounters:  05/24/15 256 lb 13.4 oz (116.5 kg)    Ideal Body Weight:  78.1 kg  BMI:  Body mass index is 35.84 kg/(m^2).  Estimated Nutritional Needs:   Kcal:  2300-2500  Protein:  125-145 grams  Fluid:  >2.3 L/day  EDUCATION NEEDS:   No education needs identified at this time  Kendell Bane RD, LDN, CNSC [phone] Pager [phone] After Hours Pager

## 2015-05-24 NOTE — Progress Notes (Signed)
Patient transported to CT and returned to 3M11. No complications. Vital signs stable at this time. No complications. RN at bedside. RT will continue to monitor.

## 2015-05-24 NOTE — Consult Note (Signed)
[place] Psychiatry Consult   Reason for Consult:  Bipolar depression, noncompliant with medication while in detention which resulted possible benzodiazepine withdrawal and status  post catatonia. Referring Physician:  Dr. Titus Mould Patient Identification: [name] MRN:  [id number] Principal Diagnosis: <principal problem not specified> Diagnosis:   Patient Active Problem List   Diagnosis Date Noted  . Seizure ([place]) [R56.9]   . Encounter for orogastric (OG) tube placement [Z46.59]   . Acute respiratory failure ([place]) [J96.00]   . Coagulase negative Staphylococcus bacteremia [R78.81]   . Bipolar I disorder, most recent episode depressed ([name]) [F31.30]   . Suicidal ideation [R45.851]   . Homicidal ideation [R45.850]   . 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 Acute encephalopathy [G93.40]   . Seizures ([name]) [R56.9]   . Status epilepticus ([place]) [G40.901] 05/15/2015  . Major depressive disorder, recurrent episode, moderate ([name]) [F33.1] 04/30/2015    Total Time spent with patient: 1 hour  Subjective:   Chase Hampton is a 25 y.o. male patient admitted with status post seizures and depression.  HPI:   Chase Hampton is a 25 years old single male admitted to [facility] medical intensive care unit for status post benzodiazepine withdrawal seizures and increased symptoms of bipolar depression, anxiety, anger outbursts and bizarre behaviors. Reviewed neurology consultation and their follow-ups regarding seizures and possible catatonia. Patient is also has multiple legal charges regarding violation of restraining order and multiple acute psychiatric hospitalization since November 2016 for depression, anger outbursts, suicidal and homicidal ideation, intention and plans. Review of medical records indicated he was hospitalized at least 3 times at [facility] both voluntarily and involuntarily and also in [place]. Patient father reported that he has a severe  allergic reaction to Haldol given during the second hospitalization. Patient was initially brought to the [facility] from detention center and then transferred to [facility] for continuous EEG monitoring. Patient mother, father and sister were in the hospital and provided detailed history of deterioration of mental status and noncompliant with the medication management and withdrawal symptoms. Patient also has history of traumatic brain injury, concussion injury while playing football and shoulder injury while playing rugby.   Please review the following information for more details. Patient is a 25 y/o M with PMH of GERD, anxiety and depression (prior [facility] hospitalizations) with recent admission to [facility] in [place] from 2/3-2/8 for major depressive disorder with homicidal / suicidal ideations after a recent break up with his fiancee. Due to restraining order violations, he was transferred from [facility] to [facility] jail were he has been on suicide watch since arrival. The patient reportedly refused to eat, drink or take medications. He was reportedly seen lying in the floor and would urinate on himself. After multiple episodes of urinating on himself, he as asked to mop up the floor which he was able to perform. Prior to discharge from [facility], notes reflect he had multiple episodes of physically aggressive behavior - including striking his own head on the seclusion room walls. At discharge he was to continue on Cymbalta, Depakote and Saphris. Prison staff report the patient refused all medications after arrival.   On 2/19, the patient was noted to have a seizure in jail. He was transported to [facility] ER via EMS. It is documented that he arrived to the ER with urine and feces all over him. There is question if patient was weaning off ativan?? Per [facility] notes but discharge note from [facility] does not include Rx for ativan. Room air  saturations were 82%, he was treated with 100% NRB. He was treated with '4mg'$  IV versed per EMS  in route. The patient had a second seizure in ER and was ultimately intubated. He was transferred to [facility] ICU for further evaluation.The patient arrived on propofol, mechanically intubated.  Past Psychiatric History: no prior psychiatric history before Nov 2016 (no suicidal attempts, never prescribed with any psychotropics).   1st Hospitalization in our unit from 02/23/15 to 03/01/15. "presented to the emergency department voicing worsening depression, thoughts of suicide and some homicidal ideation towards his girlfriend. These worsening of symptoms was triggered after his girlfriend of 5 years broke up with him. Patient reports a long history of anger issues that started in childhood. The patient also has history of multiple concussions due to playing football. Per reports from the patient's family he was holding a gun and threatening to kill himself and her." He was diagnosed with depression and was started on fluoxetine.  2nd Hospitalization in our unit: 03/07/15 to 03/14/15: "presented to our ER on 34/19 under police custody. According to report he got into an altercation with his father and then stated he was getting in his car to go kill his fiance and her family which lives very close by. Police were called. The patient was belligerent and violent." Diagnosed with MDD and d/c on fluoxetine and depakote for irritability and anger.  3rd Hospitalization in [place]: no records available: jump out of his parents car and tried to tie a cord around his neck. Per pt, prozac was increased to 40 mg. Depakote was d/c.  After his second hospitalization he f/u with [facility] in [place] where saphris was added to prozac and depakote. He also f/o with [facility] for therapy.  Interval history: Patient seen for psychiatric follow-up as staff RN contacted and reported patient has been extubated today. Neurology who has discontinued Saphris with the concern of decreased seizure threshold.  Patient has been placed on Depakote 500 mg twice daily intravenously for seizure episodes. Patient does not appear to be catatonic as is able to move all his extremities and even open his mouth with instructions. Patient was not eating, drinking or talking at this time. Patient father was at bedside stated something is getting better and hopefully was able to talk soon and also eat and drink. Patient has been released from the [facility] secondary to being placed on bond. Patient was not allowed to be released to community and [facility] department need to be notified when he was diagnosed with the medical or mental health problems, recommendation like rehabilitation are psychiatric placement. Patient was not talk to me today and try to open his eyes when called his name several times. Patient does not appear to be having any seizure episodes. We last plan to contact psychiatric social service regarding possible chronic psychiatric hospitalization at state hospital and at the same time we contact local psychiatric hospitals when medically stable. We will wait for psychiatric medication management and the patient will be able to show some emotional problem or able to communicate effectively.   Risk to Self: Is patient at risk for suicide?: No Risk to Others:   Prior Inpatient Therapy:   Prior Outpatient Therapy:    Past Medical History:  Past Medical History  Diagnosis Date  . GERD (gastroesophageal reflux disease)   . Anxiety   . Depression     Past Surgical History  Procedure Laterality Date  . No past surgeries     Family  History:  Family History  Problem Relation Age of Onset  . Heart disease Maternal Grandfather   . Diabetes Maternal Grandfather   . Heart disease Paternal Grandmother    Family Psychiatric  History: Family history of depression and multiple family members at bedtime and set up the family.  Social History:  History  Alcohol Use No     History  Drug Use  . Yes  .  Special: Marijuana    Social History   Social History  . Marital Status: Single    Spouse Name: N/A  . Number of Children: N/A  . Years of Education: N/A   Social History Main Topics  . Smoking status: Former Smoker    Types: Cigars  . Smokeless tobacco: Never Used  . Alcohol Use: No  . Drug Use: Yes    Special: Marijuana  . Sexual Activity: No   Other Topics Concern  . None   Social History Narrative   Additional Social History:    Allergies:   Allergies  Allergen Reactions  . Haldol [Haloperidol Lactate] Other (See Comments)    Unknown reaction [facility] lists no allergies as of 05/15/15)    Labs:  Results for orders placed or performed during the hospital encounter of 05/15/15 (from the past 48 hour(s))  Glucose, capillary     Status: Abnormal   Collection Time: 05/22/15  7:32 PM  Result Value Ref Range   Glucose-Capillary 100 (H) 65 - 99 mg/dL  Glucose, capillary     Status: None   Collection Time: 05/22/15 11:58 PM  Result Value Ref Range   Glucose-Capillary 97 65 - 99 mg/dL  Glucose, capillary     Status: Abnormal   Collection Time: 05/23/15  4:02 AM  Result Value Ref Range   Glucose-Capillary 117 (H) 65 - 99 mg/dL  Basic metabolic panel     Status: Abnormal   Collection Time: 05/23/15  6:48 AM  Result Value Ref Range   Sodium 144 135 - 145 mmol/L   Potassium 3.9 3.5 - 5.1 mmol/L   Chloride 101 101 - 111 mmol/L   CO2 29 22 - 32 mmol/L   Glucose, Bld 113 (H) 65 - 99 mg/dL   BUN 37 (H) 6 - 20 mg/dL   Creatinine, Ser 0.73 0.61 - 1.24 mg/dL   Calcium 8.9 8.9 - 10.3 mg/dL   GFR calc non Af Amer >60 >60 mL/min   GFR calc Af Amer >60 >60 mL/min    Comment: (NOTE) The eGFR has been calculated using the CKD EPI equation. This calculation has not been validated in all clinical situations. eGFR's persistently <60 mL/min signify possible Chronic Kidney Disease.    Anion gap 14 5 - 15  Triglycerides     Status: None   Collection Time: 05/23/15   6:48 AM  Result Value Ref Range   Triglycerides 98 <150 mg/dL  Glucose, capillary     Status: Abnormal   Collection Time: 05/23/15  7:39 AM  Result Value Ref Range   Glucose-Capillary 116 (H) 65 - 99 mg/dL   Comment 1 Notify RN    Comment 2 Document in Chart   CBC     Status: None   Collection Time: 05/23/15  8:37 AM  Result Value Ref Range   WBC 9.7 4.0 - 10.5 K/uL   RBC 4.63 4.22 - 5.81 MIL/uL   Hemoglobin 13.6 13.0 - 17.0 g/dL   HCT 40.9 39.0 - 52.0 %   MCV 88.3 78.0 -  100.0 fL   MCH 29.4 26.0 - 34.0 pg   MCHC 33.3 30.0 - 36.0 g/dL   RDW 13.9 11.5 - 15.5 %   Platelets 157 150 - 400 K/uL  Glucose, capillary     Status: Abnormal   Collection Time: 05/23/15 11:21 AM  Result Value Ref Range   Glucose-Capillary 106 (H) 65 - 99 mg/dL   Comment 1 Notify RN    Comment 2 Document in Chart   Ammonia     Status: Abnormal   Collection Time: 05/23/15 11:53 AM  Result Value Ref Range   Ammonia 38 (H) 9 - 35 umol/L  Glucose, capillary     Status: Abnormal   Collection Time: 05/23/15  3:37 PM  Result Value Ref Range   Glucose-Capillary 112 (H) 65 - 99 mg/dL   Comment 1 Notify RN    Comment 2 Document in Chart   Glucose, capillary     Status: Abnormal   Collection Time: 05/23/15  7:43 PM  Result Value Ref Range   Glucose-Capillary 108 (H) 65 - 99 mg/dL  Glucose, capillary     Status: Abnormal   Collection Time: 05/23/15 11:27 PM  Result Value Ref Range   Glucose-Capillary 111 (H) 65 - 99 mg/dL  CBC     Status: None   Collection Time: 05/24/15  3:19 AM  Result Value Ref Range   WBC 9.9 4.0 - 10.5 K/uL   RBC 4.59 4.22 - 5.81 MIL/uL   Hemoglobin 13.0 13.0 - 17.0 g/dL   HCT 40.3 39.0 - 52.0 %   MCV 87.8 78.0 - 100.0 fL   MCH 28.3 26.0 - 34.0 pg   MCHC 32.3 30.0 - 36.0 g/dL   RDW 13.6 11.5 - 15.5 %   Platelets 165 150 - 400 K/uL  Basic metabolic panel     Status: Abnormal   Collection Time: 05/24/15  3:19 AM  Result Value Ref Range   Sodium 148 (H) 135 - 145 mmol/L    Potassium 3.9 3.5 - 5.1 mmol/L   Chloride 105 101 - 111 mmol/L   CO2 31 22 - 32 mmol/L   Glucose, Bld 144 (H) 65 - 99 mg/dL   BUN 40 (H) 6 - 20 mg/dL   Creatinine, Ser 0.76 0.61 - 1.24 mg/dL   Calcium 9.2 8.9 - 10.3 mg/dL   GFR calc non Af Amer >60 >60 mL/min   GFR calc Af Amer >60 >60 mL/min    Comment: (NOTE) The eGFR has been calculated using the CKD EPI equation. This calculation has not been validated in all clinical situations. eGFR's persistently <60 mL/min signify possible Chronic Kidney Disease.    Anion gap 12 5 - 15  Magnesium     Status: None   Collection Time: 05/24/15  3:19 AM  Result Value Ref Range   Magnesium 2.1 1.7 - 2.4 mg/dL  Phosphorus     Status: None   Collection Time: 05/24/15  3:19 AM  Result Value Ref Range   Phosphorus 3.9 2.5 - 4.6 mg/dL  Glucose, capillary     Status: Abnormal   Collection Time: 05/24/15  3:32 AM  Result Value Ref Range   Glucose-Capillary 129 (H) 65 - 99 mg/dL  Blood gas, arterial     Status: Abnormal   Collection Time: 05/24/15  5:40 AM  Result Value Ref Range   FIO2 0.40    Delivery systems VENTILATOR    Mode PRESSURE REGULATED VOLUME CONTROL    VT 600 mL  LHR 15 resp/min   Peep/cpap 5.0 cm H20   pH, Arterial 7.471 (H) 7.350 - 7.450   pCO2 arterial 47.2 (H) 35.0 - 45.0 mmHg   pO2, Arterial 156 (H) 80.0 - 100.0 mmHg   Bicarbonate 33.6 (H) 20.0 - 24.0 mEq/L   TCO2 35.0 0 - 100 mmol/L   Acid-Base Excess 9.6 (H) 0.0 - 2.0 mmol/L   O2 Saturation 99.0 %   Patient temperature 100.1    Collection site RIGHT RADIAL    Drawn by [id number]    Sample type ARTERIAL    Allens test (pass/fail) PASS PASS  Glucose, capillary     Status: Abnormal   Collection Time: 05/24/15  7:40 AM  Result Value Ref Range   Glucose-Capillary 141 (H) 65 - 99 mg/dL   Comment 1 Notify RN    Comment 2 Document in Chart   Glucose, capillary     Status: Abnormal   Collection Time: 05/24/15 11:41 AM  Result Value Ref Range   Glucose-Capillary 105 (H) 65  - 99 mg/dL   Comment 1 Notify RN    Comment 2 Document in Chart   Glucose, capillary     Status: Abnormal   Collection Time: 05/24/15  3:33 PM  Result Value Ref Range   Glucose-Capillary 108 (H) 65 - 99 mg/dL   Comment 1 Notify RN    Comment 2 Document in Chart     Current Facility-Administered Medications  Medication Dose Route Frequency Provider Last Rate Last Dose  . 0.9 %  sodium chloride infusion  250 mL Intravenous PRN Kara Mead V, MD      . 0.9 %  sodium chloride infusion   Intravenous Continuous Lelon Perla, MD      . acetaminophen (TYLENOL) tablet 650 mg  650 mg Oral Q4H PRN Rigoberto Noel, MD   650 mg at 05/24/15 1310  . antiseptic oral rinse solution (CORINZ)  7 mL Mouth Rinse 10 times per day Rigoberto Noel, MD   7 mL at 05/24/15 1540  . ceFAZolin (ANCEF) IVPB 2 g/50 mL premix  2 g Intravenous Q8H Kara Mead V, MD   2 g at 05/24/15 2595  . chlorhexidine gluconate (PERIDEX) 0.12 % solution 15 mL  15 mL Mouth Rinse BID Kara Mead V, MD   15 mL at 05/24/15 0856  . clonazePAM (KLONOPIN) tablet 1 mg  1 mg Oral QHS Raylene Miyamoto, MD   1 mg at 05/23/15 2234  . dextrose 5 %-0.45 % sodium chloride infusion   Intravenous Continuous Collene Gobble, MD 10 mL/hr at 05/24/15 0300    . DULoxetine (CYMBALTA) DR capsule 30 mg  30 mg Oral Daily Ram Fuller Mandril, MD   30 mg at 05/24/15 6387  . feeding supplement (VITAL AF 1.2 CAL) liquid 1,000 mL  1,000 mL Per Tube Continuous Rush Farmer, MD 80 mL/hr at 05/24/15 1539 1,000 mL at 05/24/15 1539  . LORazepam (ATIVAN) injection 1-2 mg  1-2 mg Intravenous Q4H PRN Rigoberto Noel, MD   2 mg at 05/22/15 2131  . neomycin-bacitracin-polymyxin (NEOSPORIN) ointment   Topical BID Rigoberto Noel, MD   1 application at 56/43/32 0910  . pantoprazole sodium (PROTONIX) 40 mg/20 mL oral suspension 40 mg  40 mg Per Tube Daily Kara Mead V, MD   40 mg at 05/24/15 0900  . valproate (DEPACON) 500 mg in dextrose 5 % 50 mL IVPB  500 mg Intravenous  Q12H Marliss Coots, PA-C  500 mg at 05/24/15 1000    Musculoskeletal: Strength & Muscle Tone: decreased Gait & Station: unable to stand Patient leans: N/A  Psychiatric Specialty Exam: Review of Systems  Unable to perform ROS   Blood pressure 110/65, pulse 99, temperature 100.4 F (38 C), temperature source Axillary, resp. rate 23, height '5\' 11"'$  (1.803 m), weight 116.5 kg (256 lb 13.4 oz), SpO2 99 %.Body mass index is 35.84 kg/(m^2).  General Appearance: NA  Eye Contact::  NA  Speech:  NA  Volume:  To be determined  Mood:  NA  Affect:  NA  Thought Process:  NA  Orientation:  NA  Thought Content:  NA  Suicidal Thoughts:  To be determined  Homicidal Thoughts:  To be determine  Memory:  NA  Judgement:  NA  Insight:  NA  Psychomotor Activity:  NA  Concentration:  NA  Recall:  NA  Fund of Knowledge:NA  Language: NA  Akathisia:  NA  Handed:  Right  AIMS (if indicated):     Assets:  Others:  TBA  ADL's:  Impaired  Cognition: Impaired,  Severe  Sleep:      Treatment Plan Summary:  Patient admitted with bipolar depression, status post seizures, status post intubation and has multiple acute psychiatric hospitalization at [facility] and also [place]. Reportedly patient is noncompliant with medication management while being in detention center/Jail for Violating restraining orders against his ex-fianc.    Patient has  multiple episodes of anger outbursts, intermittent explosive behaviors, suicidal and homicidal threats in the past.    Recommended no psychiatric medication management at this time as patient was not able to eat or drink and at the same time not exhibiting any agitation, aggressive behaviors or emotional difficulties.  Appreciate psychiatric consultation and follow up as clinically required Please contact [phone] or [phone] if needs further assistance  Disposition: Patient will be reevaluated been it is appropriate and medically stable  for possibility of psychiatric inpatient stabilization if needed Supportive therapy provided about ongoing stressors.  Durward Parcel., MD 05/24/2015 4:25 PM

## 2015-05-24 NOTE — Progress Notes (Signed)
PULMONARY / CRITICAL CARE MEDICINE   Name: Chase Hampton MRN: [id number] DOB: 1991-03-03    ADMISSION DATE:  05/15/2015 CONSULTATION DATE:  05/15/15  REFERRING MD:  Dr. Normand Sloop   CHIEF COMPLAINT:  Seizure, Acute Respiratory Failure   HISTORY OF PRESENT ILLNESS:  25 y/o M with PMH of GERD, anxiety and depression (prior [facility] hospitalizations) with recent admission to [facility] in [place] from 2/3-2/8 for major depressive disorder with homicidal / suicidal ideations after a recent break up with his fiancee.  Due to restraining order violations, he was transferred from [facility] to [facility] jail were he has been on suicide watch since arrival.  The patient reportedly refused to eat, drink or take medications.  He was reportedly seen lying in the floor and would urinate on himself.  After multiple episodes of urinating on himself, he was asked to mop up the floor which he was able to perform.  Prior to discharge from [facility], notes reflect he had multiple episodes of physically aggressive behavior - including striking his own head on the seclusion room walls.  At discharge he was to continue on Cymbalta, Depakote and Saphris.   Prison staff report the patient refused all medications after arrival.    On 2/19, the patient was noted to have a seizure in jail.  He was transported to [facility] ER via EMS.  It is documented that he arrived to the ER with urine and feces all over him.  There is question if patient was weaning off ativan?? Per [facility] notes but discharge note from [facility] does not include Rx for ativan. Room air saturations were 82%, he was treated with 100% NRB.  He was treated with 4mg  IV versed per EMS in route.  The patient had a second seizure in ER and was ultimately intubated.  He was transferred to [facility] ICU for further evaluation.    The patient arrived on propofol, mechanically intubated.    SUBJECTIVE:  No sz activity, following some commands.  VITAL SIGNS: BP 132/75 mmHg  Pulse 111  Temp(Src) 100.4 F  (38 C) (Axillary)  Resp 23  Ht 5\' 11"  (1.803 m)  Wt 116.5 kg (256 lb 13.4 oz)  BMI 35.84 kg/m2  SpO2 99%  HEMODYNAMICS:   VENTILATOR SETTINGS: Vent Mode:  [-] CPAP;PSV FiO2 (%):  [30 %-40 %] 40 % Set Rate:  [15 bmp] 15 bmp Vt Set:  [600 mL] 600 mL PEEP:  [5 cmH20] 5 cmH20 Pressure Support:  [5 cmH20-8 cmH20] 8 cmH20 Plateau Pressure:  [19 cmH20-20 cmH20] 20 cmH20  INTAKE / OUTPUT: I/O last 3 completed shifts: In: 2770 [I.V.:330; NG/GT:1700; IV Piggyback:740] Out: 4575 [Urine:4575]  PHYSICAL EXAMINATION: General:  No distress, off sedation, following some commands intermittently. Neuro: PERRL, Nl S1/S2, -M/R/G. HEENT:  ETT in place. No thyromegaly, JVD. Cardiovascular:  S1, S2, RRR. No MRG. Lungs:  Clear bilaterally. Abdomen:  Soft, NT, ND and +BS. Skin:  Intact.  LABS:  BMET  Recent Labs Lab 05/21/15 0346 05/23/15 0648 05/24/15 0319  NA 146* 144 148*  K 3.7 3.9 3.9  CL 105 101 105  CO2 29 29 31   BUN 30* 37* 40*  CREATININE 0.76 0.73 0.76  GLUCOSE 131* 113* 144*   Electrolytes  Recent Labs Lab 05/21/15 0346 05/23/15 0648 05/24/15 0319  CALCIUM 8.8* 8.9 9.2  MG 2.1  --  2.1  PHOS 4.2  --  3.9   CBC  Recent Labs Lab 05/21/15 0346 05/23/15 0837 05/24/15 0319  WBC 8.6 9.7 9.9  HGB 12.9* 13.6 13.0  HCT 39.7 40.9 40.3  PLT 123* 157 165   Coag's No results for input(s): APTT, INR in the last 168 hours.  Sepsis Markers No results for input(s): LATICACIDVEN, PROCALCITON, O2SATVEN in the last 168 hours.  ABG  Recent Labs Lab 05/24/15 0540  PHART 7.471*  PCO2ART 47.2*  PO2ART 156*   Liver Enzymes  Recent Labs Lab 05/18/15 0420 05/20/15 0508  AST 59* 55*  ALT 58 62  ALKPHOS 45 51  BILITOT 0.8 0.6  ALBUMIN 2.2* 2.2*   Cardiac Enzymes No results for input(s): TROPONINI, PROBNP in the last 168 hours.  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 Questionable epileptic seizure at jail. Benzodiazepine withdrawal. Subsequent encounter. EXAM: MRI HEAD WITHOUT CONTRAST TECHNIQUE: Multiplanar, multiecho pulse sequences of the brain and surrounding structures were obtained without intravenous contrast. COMPARISON:  05/16/2015 and 04/30/2015 MR. 05/15/2015 and 12/10/2009 CT. FINDINGS: No acute infarct or intracranial hemorrhage. No hydrocephalus or advanced atrophy. Small left choroidal fissure cyst incidentally noted. No evidence of mesial temporal sclerosis. No intracranial mass lesion noted on this unenhanced exam. Major intracranial vascular structures are patent. Minimal partial opacification left mastoid air cells without obstructing lesion of the eustachian tube noted. Polypoid structure right maxillary sinus spanning over 2 cm may be a retention cyst and without change. Cervical medullary junction, pituitary region, pineal region and orbital structures unremarkable. IMPRESSION: No acute intracranial abnormality noted on this unenhanced exam as noted above. Electronically Signed   By: Lacy Duverney M.D.   On: 05/23/2015 18:50   Mr Cervical Spine Wo Contrast  05/23/2015  CLINICAL DATA:  25 year old male with depression with subsequent catatonic state. Possible benzodiazepine withdrawal. Possible seizure. Subsequent encounter. EXAM: MRI CERVICAL AND THORACIC SPINE WITHOUT CONTRAST TECHNIQUE: Multiplanar and multiecho pulse sequences of the cervical spine, to include the craniocervical junction and cervicothoracic junction, and thoracic spine, were obtained without intravenous contrast. COMPARISON:  No comparison cervical thoracic spine MR. Three recent brain MRs most recent performed just before cervical and thoracic spine MR and dictated separately. FINDINGS: MRI CERVICAL SPINE FINDINGS Patient is intubated  with nasogastric tube in place. Cervical medullary junction unremarkable. No focal cervical cord signal abnormality. Edema posterior paraspinal musculature of questionable etiology. No osseous edema to suggest acute osseous injury. Pooling of secretions. Additionally, minimal amount of retropharyngeal edema. Etiology indeterminate. C2-3:  Negative. C3-4:  Negative. C4-5:  Negative. C5-6: Tiny right paracentral protrusion C6-7:  Very small left paracentral protrusion. C7-T1: Negative. MRI THORACIC SPINE FINDINGS Exam is motion degraded. Nonspecific posterior paravertebral edema extends into the upper thoracic region. No definitive focal thoracic cord signal abnormality noted. T1-2:  Negative. T2-3: Shallow left posterior lateral protrusion with slight cephalad extension with mild flattening left aspect of the cord. T3-4: Small left paracentral protrusion mild flattening left aspect of the cord. T4-5: Tiny left paracentral protrusion/ bulge with mild narrowing ventral thecal sac without cord flattening. T5-6: Moderate size focal right paracentral protrusion with flattening right aspect of the cord. T6-7:  Bulge.  Mild narrowing ventral thecal sac. T7-8: Minimal to mild bulge greater to left. Mild narrowing ventral thecal sac. T8-9: Small left paracentral protrusion with mild left-sided cord flattening. T9-10:  Negative. T10-11: Negative. T11-12: Negative. T12-L1: Negative. IMPRESSION: MRI CERVICAL No focal cervical  cord signal abnormality. Edema posterior paraspinal musculature of questionable etiology. Pooling of secretions. Additionally, minimal amount of retropharyngeal edema. Etiology indeterminate. C5-6 tiny right paracentral protrusion C6-7 very small left paracentral protrusion. MRI THORACIC SPINE Exam is motion degraded. Nonspecific posterior paravertebral edema extends into the upper thoracic region. No definitive focal thoracic cord signal abnormality noted. Several disc protrusions largest on the right at  the T5-6 level. Summary of pertinent findings includes: T2-3 shallow left posterior lateral protrusion with slight cephalad extension with mild flattening left aspect of the cord. T3-4 small left paracentral protrusion with mild flattening left aspect of the cord. T4-5 tiny left paracentral protrusion/ bulge with mild narrowing ventral thecal sac without cord flattening. T5-6 moderate size focal right paracentral protrusion with flattening right aspect of the cord. T6-7 bulge.  Mild narrowing ventral thecal sac. T7-8 minimal to mild bulge greater to left. Mild narrowing ventral thecal sac. T8-9 small left paracentral protrusion with mild left-sided cord flattening. Electronically Signed   By: Lacy Duverney M.D.   On: 05/23/2015 19:13   Mr Thoracic Spine Wo Contrast  05/23/2015  CLINICAL DATA:  25 year old male with depression with subsequent catatonic state. Possible benzodiazepine withdrawal. Possible seizure. Subsequent encounter. EXAM: MRI CERVICAL AND THORACIC SPINE WITHOUT CONTRAST TECHNIQUE: Multiplanar and multiecho pulse sequences of the cervical spine, to include the craniocervical junction and cervicothoracic junction, and thoracic spine, were obtained without intravenous contrast. COMPARISON:  No comparison cervical thoracic spine MR. Three recent brain MRs most recent performed just before cervical and thoracic spine MR and dictated separately. FINDINGS: MRI CERVICAL SPINE FINDINGS Patient is intubated with nasogastric tube in place. Cervical medullary junction unremarkable. No focal cervical cord signal abnormality. Edema posterior paraspinal musculature of questionable etiology. No osseous edema to suggest acute osseous injury. Pooling of secretions. Additionally, minimal amount of retropharyngeal edema. Etiology indeterminate. C2-3:  Negative. C3-4:  Negative. C4-5:  Negative. C5-6: Tiny right paracentral protrusion C6-7:  Very small left paracentral protrusion. C7-T1: Negative. MRI THORACIC SPINE  FINDINGS Exam is motion degraded. Nonspecific posterior paravertebral edema extends into the upper thoracic region. No definitive focal thoracic cord signal abnormality noted. T1-2:  Negative. T2-3: Shallow left posterior lateral protrusion with slight cephalad extension with mild flattening left aspect of the cord. T3-4: Small left paracentral protrusion mild flattening left aspect of the cord. T4-5: Tiny left paracentral protrusion/ bulge with mild narrowing ventral thecal sac without cord flattening. T5-6: Moderate size focal right paracentral protrusion with flattening right aspect of the cord. T6-7:  Bulge.  Mild narrowing ventral thecal sac. T7-8: Minimal to mild bulge greater to left. Mild narrowing ventral thecal sac. T8-9: Small left paracentral protrusion with mild left-sided cord flattening. T9-10:  Negative. T10-11: Negative. T11-12: Negative. T12-L1: Negative. IMPRESSION: MRI CERVICAL No focal cervical cord signal abnormality. Edema posterior paraspinal musculature of questionable etiology. Pooling of secretions. Additionally, minimal amount of retropharyngeal edema. Etiology indeterminate. C5-6 tiny right paracentral protrusion C6-7 very small left paracentral protrusion. MRI THORACIC SPINE Exam is motion degraded. Nonspecific posterior paravertebral edema extends into the upper thoracic region. No definitive focal thoracic cord signal abnormality noted. Several disc protrusions largest on the right at the T5-6 level. Summary of pertinent findings includes: T2-3 shallow left posterior lateral protrusion with slight cephalad extension with mild flattening left aspect of the cord. T3-4 small left paracentral protrusion with mild flattening left aspect of the cord. T4-5 tiny left paracentral protrusion/ bulge with mild narrowing ventral thecal sac without cord flattening. T5-6 moderate size focal right paracentral protrusion  with flattening right aspect of the cord. T6-7 bulge.  Mild narrowing ventral  thecal sac. T7-8 minimal to mild bulge greater to left. Mild narrowing ventral thecal sac. T8-9 small left paracentral protrusion with mild left-sided cord flattening. Electronically Signed   By: Lacy Duverney M.D.   On: 05/23/2015 19:13   Dg Chest Port 1 View  05/24/2015  CLINICAL DATA:  25 year old male status post intubation. EXAM: PORTABLE CHEST 1 VIEW COMPARISON:  Multiple priors, most recently 05/23/2015. FINDINGS: An endotracheal tube is in place with tip 3.7 cm above the carina. Nasogastric tube extends into the antrum of the stomach. Lung volumes are low. No consolidative airspace disease. No pleural effusions. No pneumothorax. No pulmonary nodule or mass noted. Pulmonary vasculature and the cardiomediastinal silhouette are within normal limits. IMPRESSION: 1. Support apparatus, as above. 2. Low lung volumes without radiographic evidence of acute cardiopulmonary disease. Electronically Signed   By: Trudie Reed M.D.   On: 05/24/2015 07:46   STUDIES:  LP 2/20 >> No WBC MRI brain 2/20 >> cancelled  EEG 2/20 >>  No active seizures.  Repeat EEG>>>no seizure activity  CULTURES: LP 2/21 >>> NTD Reported coag neg staph BC, assume 19th BC 2/19>>>> BC 2/22>>>  ANTIBIOTICS: Rocephin 2gm 2/19 >> 2/22 Vanco 2/20 >> 2/22 Cefazolin 2/22 >>  SIGNIFICANT EVENTS: 2/19  Admit from jail with seizure, intubated  2/22- focal seizures mouth but LOS associated  LINES/TUBES: ETT 2/19 >> 2/28  DISCUSSION: 25 y/o M with PMH of major depression with multiple PSY admissions admitted 2/19 with seizure (no hx). Intubated in ER and transferred to [facility] ICU.   ASSESSMENT / PLAN:  PULMONARY A: Acute Hypoxic Respiratory Failure - in setting of seizure, brief hypoxia, resolved  Hilar prominence int prom pcxr 2/24 P:   Extubate today. Titrate O2 for sat of 88-92%. Will need a swallow evaluation when more awake. NGT placement.  CARDIOVASCULAR A:  Hypotension - resolved Negative TEE for  vegetations  P:  ICU monitoring of hemodynamics  Follow repeat Bcx  RENAL A:   Rhabdomyolysis - admit CK >7500 > Improved P:   D/C lasix. BMET in AM. Replace electrolytes as indicated.  GASTROINTESTINAL A:   Obesity  constipation P:   TF after insertion of NGT. SUP.   HEMATOLOGIC A:   Leukocytosis resolved Thrombocytopenia  improving P:  Monitor CBC in AM Transfuse per ICU protocol.  INFECTIOUS A:   R/o contamination coag neg staph NO fevers P:   Repeat BC neg Ancef for 14 days per ID  ENDOCRINE A:   Mild hyperglycemia  P:   Trend glucose on BMP   NEUROLOGIC A:   Seizure - suspect related to abrupt cessation of medications +/- benzo withdrawal.  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 Holding off on MRI spine as pt is on continuous EEG. Repeat bcx are negative. Ammonia level 38.  FAMILY  - Updates: Family updated 2/28. - Inter-disciplinary family meet or Palliative Care meeting due by: 2/26  The patient is critically ill with multiple organ systems failure and requires high complexity decision making for assessment and support, frequent evaluation and titration of therapies, application of advanced monitoring technologies and extensive interpretation of multiple databases.   Critical Care Time devoted to patient care services described in this note is  35  Minutes. This time reflects  time of care of this signee Dr Koren Bound. This critical care time does not reflect procedure time, or teaching time or supervisory time of PA/NP/Med student/Med Resident etc but could involve care discussion time.  Alyson Reedy, M.D. [facility] Pulmonary/Critical Care Medicine. Pager: [phone]. After hours pager: [phone].  05/24/2015, 9:45 AM

## 2015-05-24 NOTE — Procedures (Signed)
Extubation Procedure Note  Patient Chase Hampton DOB: [birth year] MRN: [id number]   Airway Documentation:     Evaluation  O2 sats: stable throughout Complications: No apparent complications Patient did tolerate procedure well. Bilateral Breath Sounds: Rhonchi, Diminished Suctioning: Oral, Airway Yes   Patient extubated to 4lnc. Vital signs stable. No complications. Patient tolerated well. RN and deputy at bedside. RT will continue to monitor.  Ave Filter 05/24/2015, 9:07 AM

## 2015-05-25 DIAGNOSIS — R45851 Suicidal ideations: Secondary | ICD-10-CM

## 2015-05-25 LAB — BASIC METABOLIC PANEL
Anion gap: 11 (ref 5–15)
BUN: 34 mg/dL — AB (ref 6–20)
CALCIUM: 9.1 mg/dL (ref 8.9–10.3)
CO2: 32 mmol/L (ref 22–32)
CREATININE: 0.7 mg/dL (ref 0.61–1.24)
Chloride: 105 mmol/L (ref 101–111)
GFR calc non Af Amer: >60 mL/min (ref >60)
Glucose, Bld: 125 mg/dL — ABNORMAL HIGH (ref 65–99)
Potassium: 3.7 mmol/L (ref 3.5–5.1)
SODIUM: 148 mmol/L — AB (ref 135–145)

## 2015-05-25 LAB — CBC
HCT: 40.6 % (ref 39.0–52.0)
Hemoglobin: 13 g/dL (ref 13.0–17.0)
MCH: 28.3 pg (ref 26.0–34.0)
MCHC: 32 g/dL (ref 30.0–36.0)
MCV: 88.5 fL (ref 78.0–100.0)
Platelets: 192 10*3/uL (ref 150–400)
RBC: 4.59 MIL/uL (ref 4.22–5.81)
RDW: 13.8 % (ref 11.5–15.5)
WBC: 11.5 10*3/uL — ABNORMAL HIGH (ref 4.0–10.5)

## 2015-05-25 LAB — MAGNESIUM: MAGNESIUM: 2.3 mg/dL (ref 1.7–2.4)

## 2015-05-25 LAB — PHOSPHORUS: PHOSPHORUS: 3.5 mg/dL (ref 2.5–4.6)

## 2015-05-25 LAB — GLUCOSE, CAPILLARY
Glucose-Capillary: 130 mg/dL — ABNORMAL HIGH (ref 65–99)
Glucose-Capillary: 118 mg/dL — ABNORMAL HIGH (ref 65–99)
Glucose-Capillary: 135 mg/dL — ABNORMAL HIGH (ref 65–99)
Glucose-Capillary: 115 mg/dL — ABNORMAL HIGH (ref 65–99)
Glucose-Capillary: 109 mg/dL — ABNORMAL HIGH (ref 65–99)

## 2015-05-25 NOTE — Progress Notes (Signed)
Attempted to call report at [phone], no answer. [phone] called again. Secretary answered and stated the nurse is busy at this time and took my name and number to call back.

## 2015-05-25 NOTE — Progress Notes (Signed)
Interval History:                                                                                                                      Chase Hampton is an 25 y.o. male patient admitted to ICU with altered mental status, extubated now. He  Has ignificant psychiatric comorbidity, which is described in the prior notes and in psychiatric consultation note.  He is making slow progress. His mother and brother ordered bedside. He follows commands. Still nonverbal but per report from the nursing staff he did speak a few words to the staff earlier today.  No clinical events suspicious for seizures.    Past Medical History: Past Medical History  Diagnosis Date  . GERD (gastroesophageal reflux disease)   . Anxiety   . Depression     Past Surgical History  Procedure Laterality Date  . No past surgeries      Family History: Family History  Problem Relation Age of Onset  . Heart disease Maternal Grandfather   . Diabetes Maternal Grandfather   . Heart disease Paternal Grandmother     Social History:   reports that he has quit smoking. His smoking use included Cigars. He has never used smokeless tobacco. He reports that he uses illicit drugs (Marijuana). He reports that he does not drink alcohol.  Allergies:  Allergies  Allergen Reactions  . Haldol [Haloperidol Lactate] Other (See Comments)    Unknown reaction [facility] lists no allergies as of 05/15/15)     Medications:                                                                                                                         Current facility-administered medications:  .  0.9 %  sodium chloride infusion, 250 mL, Intravenous, PRN, Cyril Mourning V, MD .  0.9 %  sodium chloride infusion, , Intravenous, Continuous, Lewayne Bunting, MD .  acetaminophen (TYLENOL) tablet 650 mg, 650 mg, Oral, Q4H PRN, Cyril Mourning V, MD, 650 mg at 05/24/15 1310 .  antiseptic oral rinse solution (CORINZ), 7 mL, Mouth Rinse, 10 times  per day, Oretha Milch, MD, 7 mL at 05/25/15 1748 .  ceFAZolin (ANCEF) IVPB 2 g/50 mL premix, 2 g, Intravenous, Q8H, Cyril Mourning V, MD, 2 g at 05/25/15 1532 .  chlorhexidine gluconate (PERIDEX) 0.12 % solution 15 mL, 15 mL, Mouth Rinse, BID, Cyril Mourning V, MD, 15 mL at 05/25/15 2025 .  clonazePAM (KLONOPIN) tablet  1 mg, 1 mg, Oral, QHS, Nelda Bucks, MD, 1 mg at 05/24/15 2211 .  dextrose 5 %-0.45 % sodium chloride infusion, , Intravenous, Continuous, Leslye Peer, MD, Last Rate: 10 mL/hr at 05/24/15 0300 .  DULoxetine (CYMBALTA) DR capsule 30 mg, 30 mg, Oral, Daily, Azul Coffie Daniel Nones, MD, 30 mg at 05/24/15 1610 .  feeding supplement (VITAL AF 1.2 CAL) liquid 1,000 mL, 1,000 mL, Per Tube, Continuous, Alyson Reedy, MD, Last Rate: 80 mL/hr at 05/25/15 2000, 1,000 mL at 05/25/15 2000 .  LORazepam (ATIVAN) injection 1-2 mg, 1-2 mg, Intravenous, Q4H PRN, Cyril Mourning V, MD, 2 mg at 05/22/15 2131 .  neomycin-bacitracin-polymyxin (NEOSPORIN) ointment, , Topical, BID, Oretha Milch, MD, 1 application at 05/25/15 1029 .  pantoprazole sodium (PROTONIX) 40 mg/20 mL oral suspension 40 mg, 40 mg, Per Tube, Daily, Cyril Mourning V, MD, 40 mg at 05/25/15 0900 .  valproate (DEPACON) 500 mg in dextrose 5 % 50 mL IVPB, 500 mg, Intravenous, Q12H, Ulice Dash, PA-C, 500 mg at 05/25/15 1024   Neurologic Examination:                                                                                                      Blood pressure 108/62, pulse 92, temperature 99.8 F (37.7 C), temperature source Axillary, resp. rate 21, height 5\' 11"  (1.803 m), weight 113.9 kg (251 lb 1.7 oz), SpO2 98 %.  Evaluation of higher integrative functions including: Level of alertness: Drowsy, arousable to verbal commands  Speech: Nonverbal, follows simple 1 step commands easily  Test the following cranial nerves: 2-12 grossly intact Motor examination: Increased tone in all extremities, able to elevate bilateral upper  extremities antigravity, no drift, briefly elevated his lower extremities to command   Lab Results: Basic Metabolic Panel:  Recent Labs Lab 05/20/15 0508 05/21/15 0346 05/23/15 0648 05/24/15 0319 05/25/15 0410  NA 143 146* 144 148* 148*  K 3.7 3.7 3.9 3.9 3.7  CL 108 105 101 105 105  CO2 26 29 29 31  32  GLUCOSE 114* 131* 113* 144* 125*  BUN 26* 30* 37* 40* 34*  CREATININE 0.57* 0.76 0.73 0.76 0.70  CALCIUM 8.5* 8.8* 8.9 9.2 9.1  MG  --  2.1  --  2.1 2.3  PHOS  --  4.2  --  3.9 3.5    Liver Function Tests:  Recent Labs Lab 05/20/15 0508  AST 55*  ALT 62  ALKPHOS 51  BILITOT 0.6  PROT 5.2*  ALBUMIN 2.2*   No results for input(s): LIPASE, AMYLASE in the last 168 hours.  Recent Labs Lab 05/23/15 1153  AMMONIA 38*    CBC:  Recent Labs Lab 05/20/15 0508 05/21/15 0346 05/23/15 0837 05/24/15 0319 05/25/15 0410  WBC 8.0 8.6 9.7 9.9 11.5*  NEUTROABS 4.8 5.5  --   --   --   HGB 12.9* 12.9* 13.6 13.0 13.0  HCT 38.6* 39.7 40.9 40.3 40.6  MCV 88.7 86.9 88.3 87.8 88.5  PLT 96* 123* 157 165 192    Cardiac Enzymes: No results for  input(s): CKTOTAL, CKMB, CKMBINDEX, TROPONINI in the last 168 hours.  Lipid Panel:  Recent Labs Lab 05/20/15 0508 05/23/15 0648  TRIG 91 98    CBG:  Recent Labs Lab 05/25/15 0343 05/25/15 0812 05/25/15 1141 05/25/15 1545 05/25/15 1950  GLUCAP 130* 118* 135* 115* 109*    Microbiology: Results for orders placed or performed during the hospital encounter of 05/15/15  MRSA PCR Screening     Status: None   Collection Time: 05/15/15  3:49 PM  Result Value Ref Range Status   MRSA by PCR NEGATIVE NEGATIVE Final    Comment:        The GeneXpert MRSA Assay (FDA approved for NASAL specimens only), is one component of a comprehensive MRSA colonization surveillance program. It is not intended to diagnose MRSA infection nor to guide or monitor treatment for MRSA infections.   Culture, blood (routine x 2)     Status:  None   Collection Time: 05/15/15  9:53 PM  Result Value Ref Range Status   Specimen Description BLOOD RIGHT ANTECUBITAL  Final   Special Requests BOTTLES DRAWN AEROBIC ONLY 5CC  Final   Culture NO GROWTH 5 DAYS  Final   Report Status 05/20/2015 FINAL  Final  Culture, blood (routine x 2)     Status: None   Collection Time: 05/15/15 10:06 PM  Result Value Ref Range Status   Specimen Description BLOOD RIGHT ANTECUBITAL  Final   Special Requests BOTTLES DRAWN AEROBIC ONLY 5CC  Final   Culture NO GROWTH 5 DAYS  Final   Report Status 05/20/2015 FINAL  Final  CSF culture     Status: None   Collection Time: 05/17/15  3:01 PM  Result Value Ref Range Status   Specimen Description CSF  Final   Special Requests NONE  Final   Gram Stain   Final    CYTOSPIN SMEAR WBC PRESENT, PREDOMINANTLY MONONUCLEAR NO ORGANISMS SEEN    Culture NO GROWTH 3 DAYS  Final   Report Status 05/20/2015 FINAL  Final  Culture, blood (Routine X 2) w Reflex to ID Panel     Status: None   Collection Time: 05/18/15 10:10 AM  Result Value Ref Range Status   Specimen Description BLOOD RIGHT HAND  Final   Special Requests IN PEDIATRIC BOTTLE 3CC  Final   Culture NO GROWTH 5 DAYS  Final   Report Status 05/23/2015 FINAL  Final  Culture, blood (Routine X 2) w Reflex to ID Panel     Status: None   Collection Time: 05/18/15 10:15 AM  Result Value Ref Range Status   Specimen Description BLOOD LEFT HAND  Final   Special Requests IN PEDIATRIC BOTTLE 3CC  Final   Culture NO GROWTH 5 DAYS  Final   Report Status 05/23/2015 FINAL  Final    Imaging: Mr Brain Wo Contrast  05/23/2015  CLINICAL DATA:  25 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 [facility] jail. Benzodiazepine withdrawal. Subsequent encounter. EXAM: MRI HEAD WITHOUT CONTRAST TECHNIQUE: Multiplanar, multiecho pulse sequences of the brain and surrounding structures were obtained without intravenous contrast. COMPARISON:   05/16/2015 and 04/30/2015 MR. 05/15/2015 and 12/10/2009 CT. FINDINGS: No acute infarct or intracranial hemorrhage. No hydrocephalus or advanced atrophy. Small left choroidal fissure cyst incidentally noted. No evidence of mesial temporal sclerosis. No intracranial mass lesion noted on this unenhanced exam. Major intracranial vascular structures are patent. Minimal partial opacification left mastoid air cells without obstructing lesion of the eustachian tube noted. Polypoid structure right  maxillary sinus spanning over 2 cm may be a retention cyst and without change. Cervical medullary junction, pituitary region, pineal region and orbital structures unremarkable. IMPRESSION: No acute intracranial abnormality noted on this unenhanced exam as noted above. Electronically Signed   By: Lacy Duverney M.D.   On: 05/23/2015 18:50   Mr Cervical Spine Wo Contrast  05/23/2015  CLINICAL DATA:  25 year old male with depression with subsequent catatonic state. Possible benzodiazepine withdrawal. Possible seizure. Subsequent encounter. EXAM: MRI CERVICAL AND THORACIC SPINE WITHOUT CONTRAST TECHNIQUE: Multiplanar and multiecho pulse sequences of the cervical spine, to include the craniocervical junction and cervicothoracic junction, and thoracic spine, were obtained without intravenous contrast. COMPARISON:  No comparison cervical thoracic spine MR. Three recent brain MRs most recent performed just before cervical and thoracic spine MR and dictated separately. FINDINGS: MRI CERVICAL SPINE FINDINGS Patient is intubated with nasogastric tube in place. Cervical medullary junction unremarkable. No focal cervical cord signal abnormality. Edema posterior paraspinal musculature of questionable etiology. No osseous edema to suggest acute osseous injury. Pooling of secretions. Additionally, minimal amount of retropharyngeal edema. Etiology indeterminate. C2-3:  Negative. C3-4:  Negative. C4-5:  Negative. C5-6: Tiny right paracentral  protrusion C6-7:  Very small left paracentral protrusion. C7-T1: Negative. MRI THORACIC SPINE FINDINGS Exam is motion degraded. Nonspecific posterior paravertebral edema extends into the upper thoracic region. No definitive focal thoracic cord signal abnormality noted. T1-2:  Negative. T2-3: Shallow left posterior lateral protrusion with slight cephalad extension with mild flattening left aspect of the cord. T3-4: Small left paracentral protrusion mild flattening left aspect of the cord. T4-5: Tiny left paracentral protrusion/ bulge with mild narrowing ventral thecal sac without cord flattening. T5-6: Moderate size focal right paracentral protrusion with flattening right aspect of the cord. T6-7:  Bulge.  Mild narrowing ventral thecal sac. T7-8: Minimal to mild bulge greater to left. Mild narrowing ventral thecal sac. T8-9: Small left paracentral protrusion with mild left-sided cord flattening. T9-10:  Negative. T10-11: Negative. T11-12: Negative. T12-L1: Negative. IMPRESSION: MRI CERVICAL No focal cervical cord signal abnormality. Edema posterior paraspinal musculature of questionable etiology. Pooling of secretions. Additionally, minimal amount of retropharyngeal edema. Etiology indeterminate. C5-6 tiny right paracentral protrusion C6-7 very small left paracentral protrusion. MRI THORACIC SPINE Exam is motion degraded. Nonspecific posterior paravertebral edema extends into the upper thoracic region. No definitive focal thoracic cord signal abnormality noted. Several disc protrusions largest on the right at the T5-6 level. Summary of pertinent findings includes: T2-3 shallow left posterior lateral protrusion with slight cephalad extension with mild flattening left aspect of the cord. T3-4 small left paracentral protrusion with mild flattening left aspect of the cord. T4-5 tiny left paracentral protrusion/ bulge with mild narrowing ventral thecal sac without cord flattening. T5-6 moderate size focal right  paracentral protrusion with flattening right aspect of the cord. T6-7 bulge.  Mild narrowing ventral thecal sac. T7-8 minimal to mild bulge greater to left. Mild narrowing ventral thecal sac. T8-9 small left paracentral protrusion with mild left-sided cord flattening. Electronically Signed   By: Lacy Duverney M.D.   On: 05/23/2015 19:13   Mr Thoracic Spine Wo Contrast  05/23/2015  CLINICAL DATA:  25 year old male with depression with subsequent catatonic state. Possible benzodiazepine withdrawal. Possible seizure. Subsequent encounter. EXAM: MRI CERVICAL AND THORACIC SPINE WITHOUT CONTRAST TECHNIQUE: Multiplanar and multiecho pulse sequences of the cervical spine, to include the craniocervical junction and cervicothoracic junction, and thoracic spine, were obtained without intravenous contrast. COMPARISON:  No comparison cervical thoracic spine MR. Three recent brain MRs  most recent performed just before cervical and thoracic spine MR and dictated separately. FINDINGS: MRI CERVICAL SPINE FINDINGS Patient is intubated with nasogastric tube in place. Cervical medullary junction unremarkable. No focal cervical cord signal abnormality. Edema posterior paraspinal musculature of questionable etiology. No osseous edema to suggest acute osseous injury. Pooling of secretions. Additionally, minimal amount of retropharyngeal edema. Etiology indeterminate. C2-3:  Negative. C3-4:  Negative. C4-5:  Negative. C5-6: Tiny right paracentral protrusion C6-7:  Very small left paracentral protrusion. C7-T1: Negative. MRI THORACIC SPINE FINDINGS Exam is motion degraded. Nonspecific posterior paravertebral edema extends into the upper thoracic region. No definitive focal thoracic cord signal abnormality noted. T1-2:  Negative. T2-3: Shallow left posterior lateral protrusion with slight cephalad extension with mild flattening left aspect of the cord. T3-4: Small left paracentral protrusion mild flattening left aspect of the cord. T4-5:  Tiny left paracentral protrusion/ bulge with mild narrowing ventral thecal sac without cord flattening. T5-6: Moderate size focal right paracentral protrusion with flattening right aspect of the cord. T6-7:  Bulge.  Mild narrowing ventral thecal sac. T7-8: Minimal to mild bulge greater to left. Mild narrowing ventral thecal sac. T8-9: Small left paracentral protrusion with mild left-sided cord flattening. T9-10:  Negative. T10-11: Negative. T11-12: Negative. T12-L1: Negative. IMPRESSION: MRI CERVICAL No focal cervical cord signal abnormality. Edema posterior paraspinal musculature of questionable etiology. Pooling of secretions. Additionally, minimal amount of retropharyngeal edema. Etiology indeterminate. C5-6 tiny right paracentral protrusion C6-7 very small left paracentral protrusion. MRI THORACIC SPINE Exam is motion degraded. Nonspecific posterior paravertebral edema extends into the upper thoracic region. No definitive focal thoracic cord signal abnormality noted. Several disc protrusions largest on the right at the T5-6 level. Summary of pertinent findings includes: T2-3 shallow left posterior lateral protrusion with slight cephalad extension with mild flattening left aspect of the cord. T3-4 small left paracentral protrusion with mild flattening left aspect of the cord. T4-5 tiny left paracentral protrusion/ bulge with mild narrowing ventral thecal sac without cord flattening. T5-6 moderate size focal right paracentral protrusion with flattening right aspect of the cord. T6-7 bulge.  Mild narrowing ventral thecal sac. T7-8 minimal to mild bulge greater to left. Mild narrowing ventral thecal sac. T8-9 small left paracentral protrusion with mild left-sided cord flattening. Electronically Signed   By: Lacy Duverney M.D.   On: 05/23/2015 19:13   Dg Chest Port 1 View  05/24/2015  CLINICAL DATA:  25 year old male status post intubation. EXAM: PORTABLE CHEST 1 VIEW COMPARISON:  Multiple priors, most recently  05/23/2015. FINDINGS: An endotracheal tube is in place with tip 3.7 cm above the carina. Nasogastric tube extends into the antrum of the stomach. Lung volumes are low. No consolidative airspace disease. No pleural effusions. No pneumothorax. No pulmonary nodule or mass noted. Pulmonary vasculature and the cardiomediastinal silhouette are within normal limits. IMPRESSION: 1. Support apparatus, as above. 2. Low lung volumes without radiographic evidence of acute cardiopulmonary disease. Electronically Signed   By: Trudie Reed M.D.   On: 05/24/2015 07:46   Dg Chest Port 1 View  05/23/2015  CLINICAL DATA:  Hypoxia EXAM: PORTABLE CHEST 1 VIEW COMPARISON:  May 21, 2015 FINDINGS: Endotracheal tube tip is 2.0 cm above the carina. Nasogastric tube tip and side port are in the stomach. No pneumothorax. There is patchy atelectasis in the left base. The lungs elsewhere clear. Heart is borderline enlarged with pulmonary vascularity within normal limits. No adenopathy. No bone lesions. IMPRESSION: Tube positions as described without pneumothorax. Left base atelectasis. Lungs elsewhere clear. No  change in cardiac silhouette. Electronically Signed   By: Bretta Bang III M.D.   On: 05/23/2015 07:42   Dg Abd Portable 1v  05/24/2015  CLINICAL DATA:  Feeding tube placement. EXAM: PORTABLE ABDOMEN - 1 VIEW COMPARISON:  05/17/2015. FINDINGS: Feeding tube courses through the stomach with its tip in the 4th portion of the duodenum at or near the ligament of Treitz. Bowel gas pattern unremarkable. IMPRESSION: 1. Feeding tube tip in the 4th portion of the duodenum at or near the ligament of Treitz. The tube is ready for use. 2. No acute abdominal abnormality. Electronically Signed   By: Hulan Saas M.D.   On: 05/24/2015 12:54    Assessment and plan:   Chase Hampton is an 25 y.o. male patient extubated now, making gradual progress. He follows simple verbal commands. He has significant psychiatric  comorbidity, currently on Depakote 500 twice a day for mood disorder. Although there has been suspicion for seizures, none of his EEGs have shown any abnormal discharges or seizures. The clinical description of the seizure like events prior to transfer to [facility] has not been consistent. Hence at this point, it is not certain if the patient ever had any epileptic seizures. Keppra has been gradually weaned off. Recommend continuing Depakote mainly for mood stabilizing agent given his psychiatric comorbidity. He is also on clonazepam 1 mg at bedtime. Continue physical and occupational therapy.  Discussed with his mother and brother at bedside, answered several of their questions to their satisfaction.  No further recommendations from neurology. Will sign off. Please call for any questions.

## 2015-05-25 NOTE — Evaluation (Signed)
Clinical/Bedside Swallow Evaluation Patient Details  Name: Chase Hampton MRN: [id number] Date of Birth: May 14, 1990  Today's Date: 05/25/2015 Time: SLP Start Time (ACUTE ONLY): 1432 SLP Stop Time (ACUTE ONLY): 1444 SLP Time Calculation (min) (ACUTE ONLY): 12 min  Past Medical History:  Past Medical History  Diagnosis Date  . GERD (gastroesophageal reflux disease)   . Anxiety   . Depression    Past Surgical History:  Past Surgical History  Procedure Laterality Date  . 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 Pt was transferred from [facility] to [place] jail where he refused to eat/drink or take meds. Pt noted to have seizure and brought to [facility] where he suffered another seizure and was intubated 2/19-2/28.   Assessment / Plan / Recommendation Clinical Impression  Pt's alertness was waxing/waning throughout assessment, and his eyes remained closed throughout limited trials. Initially he followed some one-step commands for oral mech examination and responded to questions appropriately. Ice chips provided with good acceptance and swift initiation of bolus preparation. However, pt quickly stopped responding to questions, following commands, and manipulating bolus in his oral cavity. Brief, involuntary movements noted in his left index finger and lower lip. RN made aware of findings and further trials were held. Recommend to maintain NPO status at this time until alertness more consistently improves. Will continue to follow.    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 Pt was transferred from [facility] to [place] jail where he refused to eat/drink or take meds. Pt noted to have seizure and brought to [facility] where he suffered another seizure and was intubated 2/19-2/28. Type of Study: Bedside Swallow Evaluation Previous Swallow Assessment: none in chart Diet Prior to this Study: NPO;NG Tube Temperature Spikes Noted: Yes (101.7) Respiratory Status: Nasal cannula History of Recent Intubation: Yes Length of Intubations (days): 9 days Date extubated: 05/24/15 Behavior/Cognition: Other (Comment) (waxing/waning mentation) Oral Cavity Assessment: Other (comment) (difficult to assess - minimal mouth opening) Vision:  (keeps eyes closed) Self-Feeding Abilities: Total assist Patient Positioning: Upright in bed Baseline Vocal Quality: Low vocal intensity Volitional Cough: Weak    Oral/Motor/Sensory Function Overall Oral Motor/Sensory Function: Generalized oral weakness   Ice Chips Ice chips: Impaired Presentation: Spoon Oral Phase Impairments: Poor awareness of bolus   Thin Liquid Thin Liquid: Not tested    Nectar Thick Nectar Thick Liquid: Not tested   Honey Thick Honey Thick Liquid: Not tested   Puree Puree: Not tested   Solid   GO   Solid: Not tested       Maxcine Ham, M.A. CCC-SLP [phone]  Maxcine Ham 05/25/2015,3:04 PM

## 2015-05-25 NOTE — Progress Notes (Signed)
2305 Called Elizabeth Eddins with the DA's office. No answer so left a voicemail. 2307 Called SGT Harle Stanford to notify that pt is being moved to 3S15.

## 2015-05-25 NOTE — Progress Notes (Addendum)
PULMONARY / CRITICAL CARE MEDICINE   Name: Chase Hampton MRN: [id number] DOB: 1990-09-14    ADMISSION DATE:  05/15/2015 CONSULTATION DATE:  05/15/15  REFERRING MD:  Dr. Normand Sloop   CHIEF COMPLAINT:  Seizure, Acute Respiratory Failure   HISTORY OF PRESENT ILLNESS:  25 y/o M with PMH of GERD, anxiety and depression (prior [facility] hospitalizations) with recent admission to [facility] in [place] from 2/3-2/8 for major depressive disorder with homicidal / suicidal ideations after a recent break up with his fiancee.  Due to restraining order violations, he was transferred from [facility] to [facility] jail were he has been on suicide watch since arrival.  The patient reportedly refused to eat, drink or take medications.  He was reportedly seen lying in the floor and would urinate on himself.  After multiple episodes of urinating on himself, he was asked to mop up the floor which he was able to perform.  Prior to discharge from [facility], notes reflect he had multiple episodes of physically aggressive behavior - including striking his own head on the seclusion room walls.  At discharge he was to continue on Cymbalta, Depakote and Saphris.   Prison staff report the patient refused all medications after arrival.    On 2/19, the patient was noted to have a seizure in jail.  He was transported to [facility] ER via EMS.  It is documented that he arrived to the ER with urine and feces all over him.  There is question if patient was weaning off ativan?? Per [facility] notes but discharge note from [facility] does not include Rx for ativan. Room air saturations were 82%, he was treated with 100% NRB.  He was treated with  IV versed per EMS in route.  The patient had a second seizure in ER and was ultimately intubated.  He was transferred to [facility] ICU for further evaluation.    The patient arrived on propofol, mechanically intubated.    SUBJECTIVE:  No sz activity, verbally responding.  VITAL SIGNS: BP 129/83 mmHg  Pulse 100  Temp(Src) 98.2 F (36.8  C) (Oral)  Resp 19  Ht  (1.803 m)  Wt 113.9 kg (251 lb 1.7 oz)  BMI 35.04 kg/m2  SpO2 96%  HEMODYNAMICS:   VENTILATOR SETTINGS:    INTAKE / OUTPUT: I/O last 3 completed shifts: In: 2851 [I.V.:360; NG/GT:1881; IV Piggyback:610] Out: 3120 [Urine:3120]  PHYSICAL EXAMINATION: General:  No distress, following some commands intermittently and verbally responsive Neuro: PERRL, Nl S1/S2, -M/R/G. HEENT:  ETT in place. No thyromegaly, JVD. Cardiovascular:  S1, S2, RRR. No MRG. Lungs:  Clear bilaterally. Abdomen:  Soft, NT, ND and +BS. Skin:  Intact.  LABS:  BMET  Recent Labs Lab 05/23/15 0648 05/24/15 0319 05/25/15 0410  NA 144 148* 148*  K 3.9 3.9 3.7  CL 101 105 105  CO2 29 31 32  BUN 37* 40* 34*  CREATININE 0.73 0.76 0.70  GLUCOSE 113* 144* 125*   Electrolytes  Recent Labs Lab 05/21/15 0346 05/23/15 0648 05/24/15 0319 05/25/15 0410  CALCIUM 8.8* 8.9 9.2 9.1  MG 2.1  --  2.1 2.3  PHOS 4.2  --  3.9 3.5   CBC  Recent Labs Lab 05/23/15 0837 05/24/15 0319 05/25/15 0410  WBC 9.7 9.9 11.5*  HGB 13.6 13.0 13.0  HCT 40.9 40.3 40.6  PLT 157 165 192   Coag's No results for input(s): APTT, INR in the last 168 hours.  Sepsis Markers No results for input(s): LATICACIDVEN, PROCALCITON, O2SATVEN in the last  168 hours.  ABG  Recent Labs Lab 05/24/15 0540  PHART 7.471*  PCO2ART 47.2*  PO2ART 156*   Liver Enzymes  Recent Labs Lab 05/20/15 0508  AST 55*  ALT 62  ALKPHOS 51  BILITOT 0.6  ALBUMIN 2.2*   Cardiac Enzymes No results for input(s): TROPONINI, PROBNP in the last 168 hours.  Glucose  Recent Labs Lab 05/24/15 1141 05/24/15 1533 05/24/15 1940 05/24/15 2339 05/25/15 0343 05/25/15 0812  GLUCAP 105* 108* 118* 109* 130* 118*   Imaging Dg Abd Portable 1v  05/24/2015  CLINICAL DATA:  Feeding tube placement. EXAM: PORTABLE ABDOMEN - 1 VIEW COMPARISON:  05/17/2015. FINDINGS: Feeding tube courses through the stomach with its tip  in the 4th portion of the duodenum at or near the ligament of Treitz. Bowel gas pattern unremarkable. IMPRESSION: 1. Feeding tube tip in the 4th portion of the duodenum at or near the ligament of Treitz. The tube is ready for use. 2. No acute abdominal abnormality. Electronically Signed   By: Hulan Saas M.D.   On: 05/24/2015 12:54   STUDIES:  LP 2/20 >> No WBC MRI brain 2/20 >> cancelled  EEG 2/20 >>  No active seizures.  Repeat EEG>>>no seizure activity  CULTURES: LP 2/21 >>> NTD Reported coag neg staph BC, assume 19th BC 2/19>>>> BC 2/22>>>  ANTIBIOTICS: Rocephin 2gm 2/19 >> 2/22 Vanco 2/20 >> 2/22 Cefazolin 2/22 >>  SIGNIFICANT EVENTS: 2/19  Admit from jail with seizure, intubated  2/22- focal seizures mouth but LOS associated  LINES/TUBES: ETT 2/19 >> 2/28  I reviewed CXR myself, no acute disease.  DISCUSSION: 25 y/o M with PMH of major depression with multiple PSY admissions admitted 2/19 with seizure (no hx). Intubated in ER and transferred to [facility] ICU.   ASSESSMENT / PLAN:  PULMONARY A: Acute Hypoxic Respiratory Failure - in setting of seizure, brief hypoxia, resolved  Hilar prominence int prom pcxr 2/24 P:   Titrate O2 for sat of 88-92%. Swallow evaluation when more awake. Cortrack in place. Titrate O2 down.  CARDIOVASCULAR A:  Hypotension - resolved Negative TEE for vegetations  P:  ICU monitoring of hemodynamics  Follow repeat Bcx  RENAL A:   Rhabdomyolysis - admit CK >7500 > Improved P:   D/Ced lasix. BMET in AM. Replace electrolytes as indicated.  GASTROINTESTINAL A:   Obesity  constipation P:   TF after insertion of NGT. SUP.   HEMATOLOGIC A:   Leukocytosis resolved Thrombocytopenia  improving P:  Monitor CBC in AM Transfuse per ICU protocol.  INFECTIOUS A:   R/o contamination coag neg staph NO fevers P:   Repeat BC neg Ancef for 14 days per ID  ENDOCRINE A:   Mild hyperglycemia  P:   Trend glucose on BMP    NEUROLOGIC A:   Seizure - suspect related to abrupt cessation of medications +/- benzo withdrawal.  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 PRN fentanyl for pain  Continue home saphris, depakote, cymbalta  Ativan PRN seizure. Ammonia level 38, d/c lactulose.  FAMILY  - Updates: Family updated 3/1.  Discussed with [facility] MD, transfer to SDU and to [facility] service with PCCM off 3/2.  Alyson Reedy, M.D. [facility] Pulmonary/Critical Care Medicine. Pager: [phone]. After hours pager: [phone].  05/25/2015, 10:30 AM

## 2015-05-26 ENCOUNTER — Inpatient Hospital Stay (HOSPITAL_COMMUNITY): Payer: BLUE CROSS/BLUE SHIELD

## 2015-05-26 DIAGNOSIS — R509 Fever, unspecified: Secondary | ICD-10-CM

## 2015-05-26 DIAGNOSIS — J9601 Acute respiratory failure with hypoxia: Secondary | ICD-10-CM | POA: Diagnosis present

## 2015-05-26 DIAGNOSIS — F061 Catatonic disorder due to known physiological condition: Secondary | ICD-10-CM | POA: Diagnosis present

## 2015-05-26 DIAGNOSIS — F329 Major depressive disorder, single episode, unspecified: Secondary | ICD-10-CM

## 2015-05-26 DIAGNOSIS — Z8669 Personal history of other diseases of the nervous system and sense organs: Secondary | ICD-10-CM | POA: Diagnosis present

## 2015-05-26 DIAGNOSIS — J9602 Acute respiratory failure with hypercapnia: Secondary | ICD-10-CM

## 2015-05-26 LAB — MAGNESIUM: Magnesium: 2.2 mg/dL (ref 1.7–2.4)

## 2015-05-26 LAB — CK: Total CK: 92 U/L (ref 49–397)

## 2015-05-26 LAB — GLUCOSE, CAPILLARY
GLUCOSE-CAPILLARY: 144 mg/dL — AB (ref 65–99)
GLUCOSE-CAPILLARY: 104 mg/dL — AB (ref 65–99)
Glucose-Capillary: 114 mg/dL — ABNORMAL HIGH (ref 65–99)
Glucose-Capillary: 127 mg/dL — ABNORMAL HIGH (ref 65–99)
Glucose-Capillary: 139 mg/dL — ABNORMAL HIGH (ref 65–99)
Glucose-Capillary: 119 mg/dL — ABNORMAL HIGH (ref 65–99)
Glucose-Capillary: 123 mg/dL — ABNORMAL HIGH (ref 65–99)

## 2015-05-26 LAB — CBC
HEMATOCRIT: 40.8 % (ref 39.0–52.0)
Hemoglobin: 13 g/dL (ref 13.0–17.0)
MCH: 28.4 pg (ref 26.0–34.0)
MCHC: 31.9 g/dL (ref 30.0–36.0)
MCV: 89.3 fL (ref 78.0–100.0)
Platelets: 232 10*3/uL (ref 150–400)
RBC: 4.57 MIL/uL (ref 4.22–5.81)
RDW: 13.9 % (ref 11.5–15.5)
WBC: 9.6 10*3/uL (ref 4.0–10.5)

## 2015-05-26 LAB — BASIC METABOLIC PANEL
Anion gap: 9 (ref 5–15)
BUN: 30 mg/dL — ABNORMAL HIGH (ref 6–20)
CALCIUM: 9 mg/dL (ref 8.9–10.3)
CO2: 31 mmol/L (ref 22–32)
CREATININE: 0.77 mg/dL (ref 0.61–1.24)
Chloride: 108 mmol/L (ref 101–111)
GFR calc non Af Amer: >60 mL/min (ref >60)
Glucose, Bld: 160 mg/dL — ABNORMAL HIGH (ref 65–99)
Potassium: 3.7 mmol/L (ref 3.5–5.1)
SODIUM: 148 mmol/L — AB (ref 135–145)

## 2015-05-26 LAB — PHOSPHORUS: PHOSPHORUS: 3.3 mg/dL (ref 2.5–4.6)

## 2015-05-26 LAB — LACTIC ACID, PLASMA: Lactic Acid, Venous: 1.4 mmol/L (ref 0.5–2.0)

## 2015-05-26 LAB — TRIGLYCERIDES: TRIGLYCERIDES: 77 mg/dL (ref <150)

## 2015-05-26 MED ORDER — ACETAMINOPHEN 160 MG/5ML PO SOLN
650.0000 mg | ORAL | Status: DC | PRN
  Administered 2015-05-26 – 2015-05-27 (×5): 650 mg via ORAL
  Filled 2015-05-26 (×5): qty 20.3

## 2015-05-26 MED ORDER — ANTISEPTIC ORAL RINSE SOLUTION (CORINZ)
7.0000 mL | Freq: Four times a day (QID) | OROMUCOSAL | Status: DC
  Administered 2015-05-26 – 2015-06-02 (×27): 7 mL via OROMUCOSAL

## 2015-05-26 NOTE — Progress Notes (Addendum)
Pt tachypnec, tachycardic, febrile, steadily inc post APAP, paged DR. New order rec'd; will continue to monitor.   Placed cooling blanket on Pt per order. Admin more APAP

## 2015-05-26 NOTE — Progress Notes (Signed)
SLP Cancellation Note  Patient Details Name: Chase Hampton MRN: [id number] DOB: [birth year]   Cancelled treatment:       Reason Eval/Treat Not Completed: Patient's level of consciousness   Lehman Whiteley, Riley Nearing 05/26/2015, 11:25 AM

## 2015-05-26 NOTE — Progress Notes (Signed)
[place] TEAM 1 - Stepdown/ICU TEAM Progress Note  Chase Hampton:[id number] DOB: [birth year] DOA: 05/15/2015 PCP: [name], MD  Admit HPI / Brief Narrative: 25 y/o WM  PMHx Anxiety, Depression (prior [facility] hospitalizations) with recent admission to [facility] in [place] from 2/3-2/8 for major depressive disorder with homicidal / suicidal ideations   After a recent break up with his fiancee. Due to restraining order violations, he was transferred from [facility] to [facility] jail were he has been on suicide watch since arrival. The patient reportedly refused to eat, drink or take medications. He was reportedly seen lying in the floor and would urinate on himself. After multiple episodes of urinating on himself, he was asked to mop up the floor which he was able to perform. Prior to discharge from [facility], notes reflect he had multiple episodes of physically aggressive behavior - including striking his own head on the seclusion room walls. At discharge he was to continue on Cymbalta, Depakote and Saphris. Prison staff report the patient refused all medications after arrival.   On 2/19, the patient was noted to have a seizure in jail. He was transported to [facility] ER via EMS. It is documented that he arrived to the ER with urine and feces all over him. There is question if patient was weaning off ativan?? Per [facility] notes but discharge note from [facility] does not include Rx for ativan. Room air saturations were 82%, he was treated with 100% NRB. He was treated with  IV versed per EMS in route. The patient had a second seizure in ER and was ultimately intubated. He was transferred to [facility] ICU for further evaluation.   The patient arrived on propofol, mechanically intubated.   HPI/Subjective: 3/2,  catatonic, though will follow some commands  Assessment/Plan: Acute Hypoxic Respiratory Failure  -Resolved  -Titrate O2 for sat of 88-92%. -Swallow evaluation when more awake. -Cortrack in  place. -ABG in a.m.  Hypotension  - resolved -TEE; normal see results below  Rhabdomyolysis  - admit CK >7500 -Obtain CK and trend   Altered mental status/Neuroleptic Malignant Syndrome? -Patient was on antipsychotic Asenapine last dose 2/26.  Patient now has spiking fevers, catatonia, rigidity which would be consistent with NMS. -Discussed case with Dr. Amada [facility] Neuro Hospitalist, and he agreed possible therefore they will see patient in A.m.  Depression -Continue Cymbalta 30 mg daily  Spiking fevers -NMS?  Vs infectious process? vs medication withdrawal,? -Patient currently on antibiotic for Staph Hominis, however although not having leukocytosis fever steadily climbing. -Psychiatric medications restarted except for Asenapine therefore shouldn't be benzodiazepine withdrawal -Obtain blood culture, urine culture -Review of MRI C-spine/T-spine reveals what might be early abscess (retropharyngeal edema),paravertebral edema extends into the upper thoracic region. Unable to obtain good exam of patient's throat secondary to patient clamping down, and difficulty moving head to examine throat externally, may be worth repeat MRI. -Consider reconsult seeing ID -Cooling blanket to maintain temperature<39 C     Code Status: FULL Family Communication: Whole family present at time of exam Disposition Plan: ??? Per psych    Consultants: Dr.[name] psychiatry Dr.Ram Fabiola Backer Nandigam neurology     Procedure/Significant Events: 2/19 Admit from jail with seizure, intubated  2/20 LP >> negative final  2/20 EEG  >> No active seizures.  Repeat EEG>>>no seizure activity 2/21 TEE; normal echocardiogram 2/22- focal seizures mouth but LOS associated 2/27 MRI brain wo contrast; normal  2/27 MRI T-spine/C-spine;  -Edema posterior paraspinal musculature  -minimal amount of retropharyngeal edema. -  Nonspecific posterior paravertebral edema extends into the  upper thoracic region -Multiple bulging disks with cord flattening     Culture 2/19 blood left  AC/wrist positive staph Hominis 2/19 was by PCR negative 2/19 blood right AC 2 negative final 2/19 HIV negative 2/21 LP  >>> negative final (VDRL, cryptococcal antigen, HSV 1/2) 2/22 blood right/left hand negative final 3/2 blood pending 3/2 urine pending   Antibiotics: Rocephin 2gm 2/19 >> 2/22 Vanco 2/20 >> 2/22 Cefazolin 2/22 >>   DVT prophylaxis: SCD   Devices NA   LINES / TUBES:  NA    Continuous Infusions: . sodium chloride    . dextrose 5 % and 0.45% NaCl 10 mL/hr at 05/24/15 0300  . feeding supplement (VITAL AF 1.2 CAL) 1,000 mL (05/26/15 0600)    Objective: VITAL SIGNS: Temp: 102.3 F (39.1 C) (03/02 1929) Temp Source: Rectal (03/02 1929) BP: 127/77 mmHg (03/02 1929) Pulse Rate: 110 (03/02 1929) SPO2; FIO2:   Intake/Output Summary (Last 24 hours) at 05/26/15 2221 Last data filed at 05/26/15 2200  Gross per 24 hour  Intake 2037.5 ml  Output   1790 ml  Net  247.5 ml     Exam: General: Almost totally catatonic, No acute respiratory distress Eyes: negative scleral hemorrhage ENT: Negative Runny nose, negative gingival bleeding, Neck:  Negative scars, masses, torticollis, lymphadenopathy, JVD Lungs: Clear to auscultation bilaterally without wheezes or crackles Cardiovascular: Tachycardic, Regular rhythm without murmur gallop or rub normal S1 and S2 Abdomen:negative abdominal pain, nondistended, positive soft, bowel sounds, no rebound, no ascites, no appreciable mass Extremities: No significant cyanosis, clubbing, or edema bilateral lower extremities Psychiatric:  Negative depression, negative anxiety, negative fatigue, negative mania Neurologic:  Patient would hold up bilateral upper extremities if you lifted them into the air and let them go. Could wiggle toes with significant prompting. Otherwise unable to follow commands   Data  Reviewed: Basic Metabolic Panel:  Recent Labs Lab 05/21/15 0346 05/23/15 0648 05/24/15 0319 05/25/15 0410 05/26/15 0430  NA 146* 144 148* 148* 148*  K 3.7 3.9 3.9 3.7 3.7  CL 105 101 105 105 108  CO2 29 29 31  32 31  GLUCOSE 131* 113* 144* 125* 160*  BUN 30* 37* 40* 34* 30*  CREATININE 0.76 0.73 0.76 0.70 0.77  CALCIUM 8.8* 8.9 9.2 9.1 9.0  MG 2.1  --  2.1 2.3 2.2  PHOS 4.2  --  3.9 3.5 3.3   Liver Function Tests:  Recent Labs Lab 05/20/15 0508  AST 55*  ALT 62  ALKPHOS 51  BILITOT 0.6  PROT 5.2*  ALBUMIN 2.2*   No results for input(s): LIPASE, AMYLASE in the last 168 hours.  Recent Labs Lab 05/23/15 1153  AMMONIA 38*   CBC:  Recent Labs Lab 05/20/15 0508 05/21/15 0346 05/23/15 0837 05/24/15 0319 05/25/15 0410 05/26/15 0430  WBC 8.0 8.6 9.7 9.9 11.5* 9.6  NEUTROABS 4.8 5.5  --   --   --   --   HGB 12.9* 12.9* 13.6 13.0 13.0 13.0  HCT 38.6* 39.7 40.9 40.3 40.6 40.8  MCV 88.7 86.9 88.3 87.8 88.5 89.3  PLT 96* 123* 157 165 192 232   Cardiac Enzymes: No results for input(s): CKTOTAL, CKMB, CKMBINDEX, TROPONINI in the last 168 hours. BNP (last 3 results) No results for input(s): BNP in the last 8760 hours.  ProBNP (last 3 results) No results for input(s): PROBNP in the last 8760 hours.  CBG:  Recent Labs Lab 05/26/15 0312 05/26/15 0729 05/26/15 1150  05/26/15 1638 05/26/15 1932  GLUCAP 127* 139* 144* 119* 104*    Recent Results (from the past 240 hour(s))  CSF culture     Status: None   Collection Time: 05/17/15  3:01 PM  Result Value Ref Range Status   Specimen Description CSF  Final   Special Requests NONE  Final   Gram Stain   Final    CYTOSPIN SMEAR WBC PRESENT, PREDOMINANTLY MONONUCLEAR NO ORGANISMS SEEN    Culture NO GROWTH 3 DAYS  Final   Report Status 05/20/2015 FINAL  Final  Culture, blood (Routine X 2) w Reflex to ID Panel     Status: None   Collection Time: 05/18/15 10:10 AM  Result Value Ref Range Status   Specimen  Description BLOOD RIGHT HAND  Final   Special Requests IN PEDIATRIC BOTTLE 3CC  Final   Culture NO GROWTH 5 DAYS  Final   Report Status 05/23/2015 FINAL  Final  Culture, blood (Routine X 2) w Reflex to ID Panel     Status: None   Collection Time: 05/18/15 10:15 AM  Result Value Ref Range Status   Specimen Description BLOOD LEFT HAND  Final   Special Requests IN PEDIATRIC BOTTLE 3CC  Final   Culture NO GROWTH 5 DAYS  Final   Report Status 05/23/2015 FINAL  Final     Studies:  Recent x-ray studies have been reviewed in detail by the Attending Physician  Scheduled Meds:  Scheduled Meds: . antiseptic oral rinse  7 mL Mouth Rinse QID  .  ceFAZolin (ANCEF) IV  2 g Intravenous Q8H  . chlorhexidine gluconate  15 mL Mouth Rinse BID  . clonazePAM  1 mg Oral QHS  . DULoxetine  30 mg Oral Daily  . neomycin-bacitracin-polymyxin   Topical BID  . pantoprazole sodium  40 mg Per Tube Daily  . valproate sodium  500 mg Intravenous Q12H    Time spent on care of this patient: 40 mins   WOODS, Roselind Messier , MD  [facility] Office  [phone] Pager [phone]  On-Call/Text Page:      [email or web address]      password [email or web address]  If 7PM-7AM, please contact night-coverage [email or web address] Password [email or web address] 05/26/2015, 10:21 PM   LOS: 11 days   Care during the described time interval was provided by me .  I have reviewed this patient's available data, including medical history, events of note, physical examination, and all test results as part of my evaluation. I have personally reviewed and interpreted all radiology studies.   Carolyne Littles, MD [phone] Pager

## 2015-05-26 NOTE — Progress Notes (Signed)
PT Cancellation Note  Patient Details Name: Chase Hampton MRN: [id number] DOB: January 02, 1991   Cancelled Treatment:    Reason Eval/Treat Not Completed: Patient not medically ready.  Per RN pt somnolent and responding to squeezing her finger only.  Will hold PT until pt more appropriate for mobility.  Thank you for this order.  Encarnacion Chu PT, DPT  Pager: [phone] Phone: [phone] 05/26/2015, 9:20 AM

## 2015-05-27 LAB — CBC WITH DIFFERENTIAL/PLATELET
Basophils Absolute: 0 10*3/uL (ref 0.0–0.1)
Basophils Relative: 0 %
Eosinophils Absolute: 0 10*3/uL (ref 0.0–0.7)
Eosinophils Relative: 0 %
HEMATOCRIT: 41.3 % (ref 39.0–52.0)
HEMOGLOBIN: 13.3 g/dL (ref 13.0–17.0)
LYMPHS ABS: 2.2 10*3/uL (ref 0.7–4.0)
LYMPHS PCT: 19 %
MCH: 28.5 pg (ref 26.0–34.0)
MCHC: 32.2 g/dL (ref 30.0–36.0)
MCV: 88.4 fL (ref 78.0–100.0)
MONOS PCT: 11 %
Monocytes Absolute: 1.2 10*3/uL — ABNORMAL HIGH (ref 0.1–1.0)
NEUTROS ABS: 7.9 10*3/uL — AB (ref 1.7–7.7)
NEUTROS PCT: 70 %
Platelets: 246 10*3/uL (ref 150–400)
RBC: 4.67 MIL/uL (ref 4.22–5.81)
RDW: 13.7 % (ref 11.5–15.5)
WBC: 11.4 10*3/uL — ABNORMAL HIGH (ref 4.0–10.5)

## 2015-05-27 LAB — COMPREHENSIVE METABOLIC PANEL WITH GFR
ALT: 95 U/L — ABNORMAL HIGH (ref 17–63)
AST: 66 U/L — ABNORMAL HIGH (ref 15–41)
Albumin: 2.5 g/dL — ABNORMAL LOW (ref 3.5–5.0)
Alkaline Phosphatase: 61 U/L (ref 38–126)
Anion gap: 9 (ref 5–15)
BUN: 31 mg/dL — ABNORMAL HIGH (ref 6–20)
CO2: 31 mmol/L (ref 22–32)
Calcium: 8.8 mg/dL — ABNORMAL LOW (ref 8.9–10.3)
Chloride: 108 mmol/L (ref 101–111)
Creatinine, Ser: 0.77 mg/dL (ref 0.61–1.24)
GFR calc Af Amer: >60 mL/min
GFR calc non Af Amer: >60 mL/min
Glucose, Bld: 140 mg/dL — ABNORMAL HIGH (ref 65–99)
Potassium: 4.1 mmol/L (ref 3.5–5.1)
Sodium: 148 mmol/L — ABNORMAL HIGH (ref 135–145)
Total Bilirubin: 0.7 mg/dL (ref 0.3–1.2)
Total Protein: 6.4 g/dL — ABNORMAL LOW (ref 6.5–8.1)

## 2015-05-27 LAB — GLUCOSE, CAPILLARY
GLUCOSE-CAPILLARY: 121 mg/dL — AB (ref 65–99)
Glucose-Capillary: 124 mg/dL — ABNORMAL HIGH (ref 65–99)
Glucose-Capillary: 130 mg/dL — ABNORMAL HIGH (ref 65–99)
Glucose-Capillary: 112 mg/dL — ABNORMAL HIGH (ref 65–99)
Glucose-Capillary: 117 mg/dL — ABNORMAL HIGH (ref 65–99)

## 2015-05-27 LAB — CK
CK TOTAL: 68 U/L (ref 49–397)
Total CK: 80 U/L (ref 49–397)
Total CK: 71 U/L (ref 49–397)

## 2015-05-27 LAB — MAGNESIUM: Magnesium: 2.2 mg/dL (ref 1.7–2.4)

## 2015-05-27 LAB — TSH: TSH: 4.026 u[IU]/mL (ref 0.350–4.500)

## 2015-05-27 MED ORDER — CLONAZEPAM 1 MG PO TABS
1.0000 mg | ORAL_TABLET | Freq: Every day | ORAL | Status: DC
  Administered 2015-05-27 – 2015-05-28 (×2): 1 mg
  Filled 2015-05-27 (×2): qty 1

## 2015-05-27 MED ORDER — AMANTADINE HCL 50 MG/5ML PO SYRP
100.0000 mg | ORAL_SOLUTION | Freq: Two times a day (BID) | ORAL | Status: DC
  Administered 2015-05-28: 100 mg via ORAL
  Filled 2015-05-27 (×2): qty 10

## 2015-05-27 MED ORDER — ENOXAPARIN SODIUM 40 MG/0.4ML ~~LOC~~ SOLN
40.0000 mg | SUBCUTANEOUS | Status: DC
  Administered 2015-05-27 – 2015-06-13 (×18): 40 mg via SUBCUTANEOUS
  Filled 2015-05-27 (×18): qty 0.4

## 2015-05-27 MED ORDER — FREE WATER
200.0000 mL | Freq: Four times a day (QID) | Status: DC
  Administered 2015-05-27 – 2015-05-28 (×4): 200 mL

## 2015-05-27 MED ORDER — FREE WATER: 200.0000 mL | Freq: Three times a day (TID) | Status: DC

## 2015-05-27 MED ORDER — BROMOCRIPTINE MESYLATE 2.5 MG PO TABS
2.5000 mg | ORAL_TABLET | Freq: Four times a day (QID) | ORAL | Status: DC
  Administered 2015-05-27 – 2015-06-14 (×71): 2.5 mg
  Filled 2015-05-27 (×80): qty 1

## 2015-05-27 MED ORDER — ACETAMINOPHEN 160 MG/5ML PO SOLN
650.0000 mg | ORAL | Status: DC | PRN
  Administered 2015-05-28: 650 mg
  Filled 2015-05-27: qty 20.3

## 2015-05-27 NOTE — Progress Notes (Signed)
[place] TEAM 1 - Stepdown/ICU TEAM PROGRESS NOTE  NEEV MCMAINS UJW:[id number] DOB: 1990-06-11 DOA: 05/15/2015 PCP: [name], MD  Admit HPI / Brief Narrative: 25 y/o Mw/ a Hx of Anxiety, Depression (prior [facility] hospitalizations) with recent admission to [facility] in [place] from 2/3 > 2/8 for major depressive disorder with homicidal / suicidal ideation after a break up with his fiancee. Due to restraining order violations, he was transferred from [facility] to [facility] jail were he had been on suicide watch. The patient reportedly refused to eat, drink or take his medications. He reportedly would only lay in the floor and urinate on himself. After multiple episodes of urinating on himself, he was asked to mop up the floor, which he was able to perform. Prior to discharge from [facility], notes reflect he had multiple episodes of physically aggressive behavior - including striking his own head on the seclusion room walls. At discharge he was to continue on Cymbalta, Depakote and Saphris. Prison staff report the patient refused all medications.   On 2/19, the patient was noted to have a seizure in jail. He was transported to [facility] ER via EMS. It is documented that he arrived to the ER with urine and feces all over him. There is question if patient was weaning off ativan?? Per [facility] notes but discharge note from [facility] does not include Rx for ativan. Room air saturations were 82%, he was treated with 100% NRB. He was treated with  IV versed per EMS en route. The patient had a second seizure in ER and was ultimately intubated. He was transferred to [facility] ICU for further evaluation.  The patient arrived to [facility] on propofol, mechanically intubated.   Significant Events: 2/19 Admit from jail with seizure, intubated  2/20 LP > negative for infection  2/20 EEG > No active seizures.  2/21 Repeat EEG > no seizure activity 2/21 TEE normal  2/22 focal seizures mouth 2/27 MRI brain wo contrast  normal 2/27 MRI T-spine/C-spine - Edema posterior paraspinal musculature - minimal amount of retropharyngeal edema - Nonspecific posterior paravertebral edema extends into the upper thoracic region - Multiple bulging disks with cord flattening   HPI/Subjective: The pt is unresponsive and not able to provide any hx.  He does not open his eyes.    Assessment/Plan:  FUO (103.8 05/26/15) -NMS?v/s infectious process? vs medication withdrawal? -Patient currently on antibiotic for Staph hominis -per Dr. Joseph Art, Neuro was to re-eval for NMS today - no note thus far  -temp improving w/ cooling blanket   Staph hominis bacteremia 2/2 upon admission to [facility] ED 05/15/2015 - TEE negative and LP benign - felt to be a genuine bacteremia per ID - ID recommended a 2 week course of tx w/ IV abx (through May 29, 2015) - ID signed off 2/23  Acute Hypoxic Respiratory Failure - Resolved  -Titrate O2 for sat of 88-92%  Hypernatremia  -appears to be due to hypovolemia - increase free water and follow   Hypotension  - resolved  Rhabdomyolysis - resolved  - admit CK >7500 - resolved in f/u at 71  Altered mental status -Patient has spiking fevers, catatonia, tachycardia, and rigidity - all of which are consistent with NMS - initiate bromocriptine - avoid serotonin active agents or antipsychotics   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 rate 21, height 5\' 11"  (1.803 m), weight 115.894 kg (255 lb 8 oz), SpO2 94 %.  Intake/Output Summary (Last 24 hours) at 05/27/15 1509 Last data  filed at 05/27/15 1206  Gross per 24 hour  Intake 1791.67 ml  Output   1550 ml  Net 241.67 ml   Exam: General: No acute respiratory distress Lungs: Clear to auscultation bilaterally without wheezes or crackles Cardiovascular: tachycardic but regular - no M  Abdomen: Nondistended, soft, bowel sounds positive, no rebound, no ascites, no appreciable mass Extremities: No significant cyanosis, or clubbing - trace edema bilateral lower extremities - muscles of upper and lower extrimities modestly rigid/firm   Data Reviewed:  Basic Metabolic Panel:  Recent Labs Lab 05/21/15 0346 05/23/15 0648 05/24/15 0319 05/25/15 0410 05/26/15 0430 05/27/15 0447  NA 146* 144 148* 148* 148* 148*  K 3.7 3.9 3.9 3.7 3.7 4.1  CL 105 101 105 105 108 108  CO2 29 29 31  32 31 31  GLUCOSE 131* 113* 144* 125* 160* 140*  BUN 30* 37* 40* 34* 30* 31*  CREATININE 0.76 0.73 0.76 0.70 0.77 0.77  CALCIUM 8.8* 8.9 9.2 9.1 9.0 8.8*  MG 2.1  --  2.1 2.3 2.2 2.2  PHOS 4.2  --  3.9 3.5 3.3  --     CBC:  Recent Labs Lab 05/21/15 0346 05/23/15 0837 05/24/15 0319 05/25/15 0410 05/26/15 0430 05/27/15 0447  WBC 8.6 9.7 9.9 11.5* 9.6 11.4*  NEUTROABS 5.5  --   --   --   --  7.9*  HGB 12.9* 13.6 13.0 13.0 13.0 13.3  HCT 39.7 40.9 40.3 40.6 40.8 41.3  MCV 86.9 88.3 87.8 88.5 89.3 88.4  PLT 123* 157 165 192 232 246    Liver Function Tests:  Recent Labs Lab 05/27/15 0447  AST 66*  ALT 95*  ALKPHOS 61  BILITOT 0.7  PROT 6.4*  ALBUMIN 2.5*    Recent Labs Lab 05/23/15 1153  AMMONIA 38*   Cardiac Enzymes:  Recent Labs Lab 05/26/15 2300 05/27/15 0447 05/27/15 0956  CKTOTAL 92 80 71    CBG:  Recent Labs Lab 05/26/15 1932 05/26/15 2303 05/27/15 0331 05/27/15 0819 05/27/15 1203  GLUCAP 104* 123* 124* 130* 112*    Recent Results (from the past 240 hour(s))  Culture, blood (Routine X 2) w Reflex to ID Panel     Status: None   Collection Time: 05/18/15 10:10 AM  Result Value Ref  Range Status   Specimen Description BLOOD RIGHT HAND  Final   Special Requests IN PEDIATRIC BOTTLE 3CC  Final   Culture NO GROWTH 5 DAYS  Final   Report Status 05/23/2015 FINAL  Final  Culture, blood (Routine X 2) w Reflex to ID Panel     Status: None   Collection Time: 05/18/15 10:15 AM  Result Value Ref Range Status   Specimen Description BLOOD LEFT HAND  Final   Special Requests IN PEDIATRIC BOTTLE 3CC  Final   Culture NO GROWTH 5 DAYS  Final   Report Status 05/23/2015 FINAL  Final     Studies:   Recent x-ray studies have been reviewed in detail by the Attending Physician  Scheduled Meds:  Scheduled Meds: . antiseptic oral rinse  7 mL Mouth Rinse QID  .  ceFAZolin (ANCEF) IV  2 g Intravenous Q8H  . chlorhexidine gluconate  15 mL Mouth Rinse BID  . clonazePAM  1 mg Oral QHS  . DULoxetine  30 mg Oral Daily  . neomycin-bacitracin-polymyxin   Topical BID  . pantoprazole sodium  40 mg Per Tube Daily  . valproate sodium  500 mg Intravenous Q12H    Time spent on care of this patient: 35 mins   MCCLUNG,JEFFREY T , MD   [facility] Office  [phone] Pager - Text Page per Loretha Stapler as per below:  On-Call/Text Page:      [email or web address]      password [email or web address]  If 7PM-7AM, please contact night-coverage [email or web address] Password [email or web address] 05/27/2015, 3:09 PM   LOS: 12 days

## 2015-05-27 NOTE — Progress Notes (Signed)
Interval History:                                                                                                                      Chase Hampton is an 25 y.o. male patient  with significant psychiatric comorbidity with severe depression been on several antipsychotics in the past. He is noted to have rigidity in his neck and bilateral upper extremities. There is suspicion for neuroleptic malignant syndrome. Bromocriptine 2.5 mg 4 times a day was started by the primary team.  Patient is lethargic, unable to provide any history. Follows simple commands with multiple problems. His father and sister were at bedside today.    Past Medical History: Past Medical History  Diagnosis Date  . GERD (gastroesophageal reflux disease)   . Anxiety   . Depression     Past Surgical History  Procedure Laterality Date  . No past surgeries      Family History: Family History  Problem Relation Age of Onset  . Heart disease Maternal Grandfather   . Diabetes Maternal Grandfather   . Heart disease Paternal Grandmother     Social History:   reports that he has quit smoking. His smoking use included Cigars. He has never used smokeless tobacco. He reports that he uses illicit drugs (Marijuana). He reports that he does not drink alcohol.  Allergies:  Allergies  Allergen Reactions  . Haldol [Haloperidol Lactate] Other (See Comments)    Unknown reaction [facility] lists no allergies as of 05/15/15)     Medications:                                                                                                                         Current facility-administered medications:  .  acetaminophen (TYLENOL) solution 650 mg, 650 mg, Per Tube, Q4H PRN, Lonia Blood, MD .  Melene Muller ON 05/28/2015] amantadine (SYMMETREL) solution 100 mg, 100 mg, Oral, BID, Azarius Lambson Daniel Nones, MD .  antiseptic oral rinse solution (CORINZ), 7 mL, Mouth Rinse, QID, Drema Dallas, MD, 7 mL at 05/27/15  1600 .  bromocriptine (PARLODEL) tablet 2.5 mg, 2.5 mg, Per Tube, QID, Lonia Blood, MD .  ceFAZolin (ANCEF) IVPB 2 g/50 mL premix, 2 g, Intravenous, Q8H, Cyril Mourning V, MD, 2 g at 05/27/15 1415 .  chlorhexidine gluconate (PERIDEX) 0.12 % solution 15 mL, 15 mL, Mouth Rinse, BID, Cyril Mourning V, MD, 15 mL at 05/27/15 0800 .  clonazePAM (KLONOPIN) tablet 1  mg, 1 mg, Per Tube, QHS, Lonia BloodJeffrey T McClung, MD .  dextrose 5 %-0.45 % sodium chloride infusion, , Intravenous, Continuous, Leslye Peerobert S Byrum, MD, Last Rate: 10 mL/hr at 05/26/15 2300 .  DULoxetine (CYMBALTA) DR capsule 30 mg, 30 mg, Oral, Daily, Maribelle Hopple Daniel NonesNarayan Kaveer Zelena Bushong, MD, 30 mg at 05/27/15 1011 .  enoxaparin (LOVENOX) injection 40 mg, 40 mg, Subcutaneous, Q24H, Lonia BloodJeffrey T McClung, MD .  feeding supplement (VITAL AF 1.2 CAL) liquid 1,000 mL, 1,000 mL, Per Tube, Continuous, Alyson ReedyWesam G Yacoub, MD, Last Rate: 80 mL/hr at 05/27/15 0701, 1,000 mL at 05/27/15 0701 .  free water 200 mL, 200 mL, Per Tube, 4 times per day, Lonia BloodJeffrey T McClung, MD .  LORazepam (ATIVAN) injection 1-2 mg, 1-2 mg, Intravenous, Q4H PRN, Cyril Mourningakesh Alva V, MD, 2 mg at 05/22/15 2131 .  neomycin-bacitracin-polymyxin (NEOSPORIN) ointment, , Topical, BID, Cyril Mourningakesh Alva V, MD .  valproate (DEPACON) 500 mg in dextrose 5 % 50 mL IVPB, 500 mg, Intravenous, Q12H, Ulice Dashavid R Smith, PA-C, 500 mg at 05/27/15 1011   Neurologic Examination:                                                                                                      Blood pressure 123/86, pulse 97, temperature 99.9 F (37.7 C), temperature source Rectal, resp. rate 20, height 5\' 11"  (1.803 m), weight 115.894 kg (255 lb 8 oz), SpO2 96 %.  patient is drowsy, did not open eyes to verbal command the slightly open eyes to tactile stimulus. Was not following commands.  He continues to have moderate rigidity in bilateral upper extremities , maybe minimally better compared to  Couple of days ago.    Lab Results: Basic Metabolic  Panel:  Recent Labs Lab 05/21/15 0346 05/23/15 0648 05/24/15 0319 05/25/15 0410 05/26/15 0430 05/27/15 0447  NA 146* 144 148* 148* 148* 148*  K 3.7 3.9 3.9 3.7 3.7 4.1  CL 105 101 105 105 108 108  CO2 29 29 31  32 31 31  GLUCOSE 131* 113* 144* 125* 160* 140*  BUN 30* 37* 40* 34* 30* 31*  CREATININE 0.76 0.73 0.76 0.70 0.77 0.77  CALCIUM 8.8* 8.9 9.2 9.1 9.0 8.8*  MG 2.1  --  2.1 2.3 2.2 2.2  PHOS 4.2  --  3.9 3.5 3.3  --     Liver Function Tests:  Recent Labs Lab 05/27/15 0447  AST 66*  ALT 95*  ALKPHOS 61  BILITOT 0.7  PROT 6.4*  ALBUMIN 2.5*   No results for input(s): LIPASE, AMYLASE in the last 168 hours.  Recent Labs Lab 05/23/15 1153  AMMONIA 38*    CBC:  Recent Labs Lab 05/21/15 0346 05/23/15 0837 05/24/15 0319 05/25/15 0410 05/26/15 0430 05/27/15 0447  WBC 8.6 9.7 9.9 11.5* 9.6 11.4*  NEUTROABS 5.5  --   --   --   --  7.9*  HGB 12.9* 13.6 13.0 13.0 13.0 13.3  HCT 39.7 40.9 40.3 40.6 40.8 41.3  MCV 86.9 88.3 87.8 88.5 89.3 88.4  PLT 123* 157 165 192 232 246  Cardiac Enzymes:  Recent Labs Lab 05/26/15 2300 05/27/15 0447 05/27/15 0956 05/27/15 1538  CKTOTAL 92 80 71 68    Lipid Panel:  Recent Labs Lab 05/23/15 0648 05/26/15 0430  TRIG 98 77    CBG:  Recent Labs Lab 05/27/15 0331 05/27/15 0819 05/27/15 1203 05/27/15 1531 05/27/15 1908  GLUCAP 124* 130* 112* 121* 117*    Microbiology: Results for orders placed or performed during the hospital encounter of 05/15/15  MRSA PCR Screening     Status: None   Collection Time: 05/15/15  3:49 PM  Result Value Ref Range Status   MRSA by PCR NEGATIVE NEGATIVE Final    Comment:        The GeneXpert MRSA Assay (FDA approved for NASAL specimens only), is one component of a comprehensive MRSA colonization surveillance program. It is not intended to diagnose MRSA infection nor to guide or monitor treatment for MRSA infections.   Culture, blood (routine x 2)     Status:  None   Collection Time: 05/15/15  9:53 PM  Result Value Ref Range Status   Specimen Description BLOOD RIGHT ANTECUBITAL  Final   Special Requests BOTTLES DRAWN AEROBIC ONLY 5CC  Final   Culture NO GROWTH 5 DAYS  Final   Report Status 05/20/2015 FINAL  Final  Culture, blood (routine x 2)     Status: None   Collection Time: 05/15/15 10:06 PM  Result Value Ref Range Status   Specimen Description BLOOD RIGHT ANTECUBITAL  Final   Special Requests BOTTLES DRAWN AEROBIC ONLY 5CC  Final   Culture NO GROWTH 5 DAYS  Final   Report Status 05/20/2015 FINAL  Final  CSF culture     Status: None   Collection Time: 05/17/15  3:01 PM  Result Value Ref Range Status   Specimen Description CSF  Final   Special Requests NONE  Final   Gram Stain   Final    CYTOSPIN SMEAR WBC PRESENT, PREDOMINANTLY MONONUCLEAR NO ORGANISMS SEEN    Culture NO GROWTH 3 DAYS  Final   Report Status 05/20/2015 FINAL  Final  Culture, blood (Routine X 2) w Reflex to ID Panel     Status: None   Collection Time: 05/18/15 10:10 AM  Result Value Ref Range Status   Specimen Description BLOOD RIGHT HAND  Final   Special Requests IN PEDIATRIC BOTTLE 3CC  Final   Culture NO GROWTH 5 DAYS  Final   Report Status 05/23/2015 FINAL  Final  Culture, blood (Routine X 2) w Reflex to ID Panel     Status: None   Collection Time: 05/18/15 10:15 AM  Result Value Ref Range Status   Specimen Description BLOOD LEFT HAND  Final   Special Requests IN PEDIATRIC BOTTLE 3CC  Final   Culture NO GROWTH 5 DAYS  Final   Report Status 05/23/2015 FINAL  Final    Imaging: Dg Chest Port 1 View  05/26/2015  CLINICAL DATA:  Respiratory abnormalities. EXAM: PORTABLE CHEST 1 VIEW COMPARISON:  05/24/2015 FINDINGS: Endotracheal tube has been removed. Feeding tube is in place, tip overlying the region of the stomach. Shallow lung inflation. There is minimal left basilar atelectasis. Heart size is upper limits normal. No focal consolidations or evidence for  pulmonary edema. IMPRESSION: 1. Shallow inflation. 2. Left lower lobe atelectasis. Electronically Signed   By: Norva Pavlov M.D.   On: 05/26/2015 15:39   Dg Chest Port 1 View  05/24/2015  CLINICAL DATA:  25 year old male status post intubation. EXAM:  PORTABLE CHEST 1 VIEW COMPARISON:  Multiple priors, most recently 05/23/2015. FINDINGS: An endotracheal tube is in place with tip 3.7 cm above the carina. Nasogastric tube extends into the antrum of the stomach. Lung volumes are low. No consolidative airspace disease. No pleural effusions. No pneumothorax. No pulmonary nodule or mass noted. Pulmonary vasculature and the cardiomediastinal silhouette are within normal limits. IMPRESSION: 1. Support apparatus, as above. 2. Low lung volumes without radiographic evidence of acute cardiopulmonary disease. Electronically Signed   By: Trudie Reed M.D.   On: 05/24/2015 07:46   Dg Abd Portable 1v  05/24/2015  CLINICAL DATA:  Feeding tube placement. EXAM: PORTABLE ABDOMEN - 1 VIEW COMPARISON:  05/17/2015. FINDINGS: Feeding tube courses through the stomach with its tip in the 4th portion of the duodenum at or near the ligament of Treitz. Bowel gas pattern unremarkable. IMPRESSION: 1. Feeding tube tip in the 4th portion of the duodenum at or near the ligament of Treitz. The tube is ready for use. 2. No acute abdominal abnormality. Electronically Signed   By: Hulan Saas M.D.   On: 05/24/2015 12:54    Assessment and plan:   VINNY TARANTO is an 25 y.o. male patient who presented with significant psychiatric comorbidity with severe depression been on several antipsychotics in the past. He is noted to have rigidity in his neck and bilateral upper extremities. There is suspicion for neuroleptic malignant syndrome.  He certainly has  Extrapyramidal motor symptoms in the form of significant rigidity.  Reason for his continued drowsiness is unclear. Previously his EEG showed normal background activity  although. Cannot exclude coexisting psychiatric comorbidity contributing to some of the difficulties experienced with making him follow commands consistently.  Bromocriptine 2.5 mg 4 times a day was started by the primary team.  Recommend starting amantadine 100 mg twice a day which may also help with the extrapyramidal motor symptoms. We'll follow-up.

## 2015-05-27 NOTE — Evaluation (Signed)
Physical Therapy Evaluation Patient Details Name: Chase Hampton MRN: [id number] DOB: Apr 11, 1990 Today's Date: 05/27/2015   History of Present Illness  Pt is a 25 y/o M admitted from jail where he had multiple episodes of urinating on himself and physically aggressive behavior, including striking his own head on the wall.  Pt had another seizure in the ER and was ultimately intubated.  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  His PMH includes anxiety and depression.    Clinical Impression  Pt admitted with above diagnosis. Pt currently with functional limitations due to the deficits listed below (see PT Problem List).  Chase Hampton presents w/ AMS and increased tone throughout Bil UEs and LEs.  He required total assist for all aspects of bed mobility.  Per mother pt was Ind PTA.  Pt will benefit from skilled PT to increase their independence and safety with mobility to allow discharge to the venue listed below.      Follow Up Recommendations SNF;Supervision/Assistance - 24 hour (pt to return to prison?)    Equipment Recommendations  Wheelchair (measurements PT);Wheelchair cushion (measurements PT)    Recommendations for Other Services OT consult     Precautions / Restrictions Precautions Precautions: Fall Precaution Comments: seizure precautions Restrictions Weight Bearing Restrictions: No      Mobility  Bed Mobility Overal bed mobility: Needs Assistance;+2 for physical assistance;+ 2 for safety/equipment Bed Mobility: Supine to Sit;Sit to Supine     Supine to sit: Total assist;+2 for physical assistance;+2 for safety/equipment;HOB elevated Sit to supine: Total assist;+2 for physical assistance;+2 for safety/equipment   General bed mobility comments: Assist needed for all aspects of bed mobility w/ use of bed pad for positioning.    Transfers                 General transfer comment: unable to attempt  Ambulation/Gait                 Stairs            Wheelchair Mobility    Modified Rankin (Stroke Patients Only)       Balance Overall balance assessment: Needs assistance Sitting-balance support: Bilateral upper extremity supported;Feet supported Sitting balance-Leahy Scale: Poor Sitting balance - Comments: Able to sit w/ close min guard for ~10 seconds but w/ increasing posterior lean.  Otherwise, requires min>max assist to maintain upright. Postural control: Posterior lean                                   Pertinent Vitals/Pain Pain Assessment: Faces Faces Pain Scale: No hurt    Home Living Family/patient expects to be discharged to:: [facility]/Prison                      Prior Function Level of Independence: Independent         Comments: Per mother pt was independent w/o use of AD      Hand Dominance   Dominant Hand: Right    Extremity/Trunk Assessment   Upper Extremity Assessment: Defer to OT evaluation (increased tone throughout)           Lower Extremity Assessment: RLE deficits/detail;LLE deficits/detail RLE Deficits / Details: Increased tone throughout (Lt LE>Rt LE); 2 on Modified Ashworth Scale w/ DF and Knee flexion and extension LLE Deficits / Details: Increased tone throughout (Lt LE>Rt LE); 2 on Modified Ashworth Scale w/ DF  and Knee flexion and extension  Cervical / Trunk Assessment: Other exceptions  Communication   Communication: Other (comment);Expressive difficulties (nonverbal)  Cognition Arousal/Alertness: Lethargic Behavior During Therapy: Flat affect Overall Cognitive Status: Difficult to assess Area of Impairment: Attention;Memory;Following commands;Safety/judgement;Problem solving   Current Attention Level: Sustained   Following Commands: Follows one step commands with increased time     Problem Solving: Decreased initiation;Difficulty sequencing General Comments: Pt w/ increased RR into high 20's w/ increased  effort to participate in therapy.  Pt follows all command but w/ increased time and assist as needed.    General Comments General comments (skin integrity, edema, etc.): Mother and high school wrestling coach present throughout session and both very encouraging and motivating    Exercises General Exercises - Lower Extremity Ankle Circles/Pumps: PROM;Both;10 reps;Supine;Other (comment) (w/ increased tone) Long Arc Quad: PROM;Both;10 reps;Seated Straight Leg Raises: PROM;Both;5 reps;Supine      Assessment/Plan    PT Assessment Patient needs continued PT services  PT Diagnosis Difficulty walking;Other (comment) (increased tone)   PT Problem List Decreased strength;Decreased range of motion;Decreased activity tolerance;Decreased balance;Decreased mobility;Decreased cognition;Decreased coordination;Decreased knowledge of use of DME;Decreased safety awareness;Decreased knowledge of precautions;Cardiopulmonary status limiting activity;Impaired tone  PT Treatment Interventions DME instruction;Gait training;Functional mobility training;Therapeutic exercise;Therapeutic activities;Balance training;Cognitive remediation;Neuromuscular re-education;Patient/family education;Modalities;Wheelchair mobility training;Manual techniques   PT Goals (Current goals can be found in the Care Plan section) Acute Rehab PT Goals Patient Stated Goal: unable to state but per mother and coach, to be able to sit up on his own and walk PT Goal Formulation: With family Time For Goal Achievement: 06/17/15 Potential to Achieve Goals: Fair    Frequency Min 2X/week   Barriers to discharge        Co-evaluation               End of Session Equipment Utilized During Treatment: Oxygen Activity Tolerance: Patient limited by fatigue;Patient limited by lethargy Patient left: in bed;with call bell/phone within reach;with bed alarm set;with SCD's reapplied;with family/visitor present Nurse Communication: Mobility  status;Need for lift equipment         Time: 1007-1033 PT Time Calculation (min) (ACUTE ONLY): 26 min   Charges:   PT Evaluation $PT Eval High Complexity: 1 Procedure PT Treatments $Therapeutic Exercise: 8-22 mins   PT G Codes:       Encarnacion ChuAshley Abashian PT, DPT  Pager: [phone] Phone: [phone] 05/27/2015, 12:31 PM

## 2015-05-27 NOTE — Consult Note (Signed)
[facility] Face-to-Face Psychiatry Consult follow-up  Reason for Consult:  Bipolar depression, noncompliant with medication while in detention which resulted possible benzodiazepine withdrawal and status  post catatonia. Referring Physician:  Dr. Titus Mould Patient Identification: Chase Hampton:  [id number] Principal Diagnosis: <principal problem not specified> Diagnosis:   Patient Active Problem List   Diagnosis Date Noted  . Acute respiratory failure with hypoxia and hypercapnia (HCC) [J96.01, J96.02]   . Encephalopathy acute [G93.40]   . Catatonia ([place]) [F06.1]   . FUO (fever of unknown origin) [R50.9]   . Depression [F32.9]   . Neuroleptic malignant syndrome [G21.0]   . Seizure ([place]) [R56.9]   . Encounter for orogastric (OG) tube placement [Z46.59]   . Acute respiratory failure ([place]) [J96.00]   . Coagulase negative Staphylococcus bacteremia [R78.81]   . Bipolar I disorder, most recent episode depressed ([place]) [F31.30]   . Suicidal ideation [R45.851]   . Homicidal ideation [R45.850]   . 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 Acute encephalopathy [G93.40]   . Seizures ([place]) [R56.9]   . Status epilepticus ([place]) [G40.901] 05/15/2015  . Major depressive disorder, recurrent episode, moderate ([place]) [F33.1] 04/30/2015    Total Time spent with patient: 20 minutes  Subjective:   Chase Hampton is a 25 y.o. male patient admitted with status post seizures and depression.  HPI:   Chase Hampton is a 25 years old single male admitted to [facility] medical intensive care unit for status post benzodiazepine withdrawal seizures and increased symptoms of bipolar depression, anxiety, anger outbursts and bizarre behaviors. Reviewed neurology consultation and their follow-ups regarding seizures and possible catatonia. Patient is also has multiple legal charges regarding violation of restraining order and multiple acute psychiatric hospitalization since November 2016 for depression, anger outbursts,  suicidal and homicidal ideation, intention and plans. Review of medical records indicated he was hospitalized at least 3 times at [facility] both voluntarily and involuntarily and also in [place]. Patient father reported that he has a severe allergic reaction to Haldol given during the second hospitalization. Patient was initially brought to the [facility] from detention center and then transferred to [facility] for continuous EEG monitoring. Patient mother, father and sister were in the hospital and provided detailed history of deterioration of mental status and noncompliant with the medication management and withdrawal symptoms. Patient also has history of traumatic brain injury, concussion injury while playing football and shoulder injury while playing rugby.   Please review the following information for more details. Patient is a 25 y/o M with PMH of GERD, anxiety and depression (prior [facility] hospitalizations) with recent admission to [facility] in [place] from 2/3-2/8 for major depressive disorder with homicidal / suicidal ideations after a recent break up with his fiancee. Due to restraining order violations, he was transferred from [facility] to [facility] jail were he has been on suicide watch since arrival. The patient reportedly refused to eat, drink or take medications. He was reportedly seen lying in the floor and would urinate on himself. After multiple episodes of urinating on himself, he as asked to mop up the floor which he was able to perform. Prior to discharge from [facility], notes reflect he had multiple episodes of physically aggressive behavior - including striking his own head on the seclusion room walls. At discharge he was to continue on Cymbalta, Depakote and Saphris. Prison staff report the patient refused all medications after arrival.   On 2/19, the patient was noted to have a seizure in jail. He was transported  to [facility] ER via EMS. It is documented that he arrived to the  ER with urine and feces all over him. There is question if patient was weaning off ativan?? Per [facility] notes but discharge note from [facility] does not include Rx for ativan. Room air saturations were 82%, he was treated with 100% NRB. He was treated with 80m IV versed per EMS in route. The patient had a second seizure in ER and was ultimately intubated. [facility].The patient arrived on propofol, mechanically intubated.  Past Psychiatric History: no prior psychiatric history before Nov 2016 (no suicidal attempts, never prescribed with any psychotropics).   1st Hospitalization in our unit from 02/23/15 to 03/01/15. "presented to the emergency department voicing worsening depression, thoughts of suicide and some homicidal ideation towards his girlfriend. These worsening of symptoms was triggered after his girlfriend of 5 years broke up with him. Patient reports a long history of anger issues that started in childhood. The patient also has history of multiple concussions due to playing football. Per reports from the patient's family he was holding a gun and threatening to kill himself and her." He was diagnosed with depression and was started on fluoxetine.  2nd Hospitalization in our unit: 03/07/15 to 03/14/15: "presented to our ER on 105/39under police custody. According to report he got into an altercation with his father and then stated he was getting in his car to go kill his fiance and her family which lives very close by. Police were called. The patient was belligerent and violent." Diagnosed with MDD and d/c on fluoxetine and depakote for irritability and anger.  [place]: jump out of his parents car and tried to tie a cord around his neck. Per pt, prozac was increased to 40 mg. Depakote was d/c.  [facility]  depakote. [facility]  Interval history: Patient seen for psychiatric follow-up and briefly spoke with the patient mother who is at bedside. Patient has been physically sick with high fevers and possibly rhabdomyolysis. Patient was out of intensive care unit and [facility] Patient was not able to contribute for this evaluation but able to follow the commands and try to open his fingers from his right fist as instructed. Psychiatric consultation will continue to follow-up and will provide appropriate recommendations when he is medically stable.   Risk to Self: Is patient at risk for suicide?: No Risk to Others:   Prior Inpatient Therapy:   Prior Outpatient Therapy:    Past Medical History:  Past Medical History  Diagnosis Date  . GERD (gastroesophageal reflux disease)   . Anxiety   . Depression     Past Surgical History  Procedure Laterality Date  . No past surgeries     Family History:  Family History  Problem Relation Age of Onset  . Heart disease Maternal Grandfather   . Diabetes Maternal Grandfather   . Heart disease Paternal Grandmother    Family Psychiatric  History: Family history of depression and multiple family members at bedtime and set up the family.  Social History:  History  Alcohol Use No     History  Drug Use  . Yes  . Special: Marijuana    Social History   Social History  . Marital Status: Single    Spouse Name:  N/A  . Number of Children: N/A  . Years of Education: N/A   Social History Main Topics  . Smoking status: Former Smoker    Types: Cigars  . Smokeless tobacco: Never Used  . Alcohol Use: No  . Drug Use: Yes    Special: Marijuana  . Sexual Activity: No   Other Topics Concern  . None   Social History Narrative   Additional Social History:    Allergies:   Allergies  Allergen Reactions  . Haldol [Haloperidol Lactate] Other (See Comments)    Unknown reaction [facility] lists no allergies as of 05/15/15)    Labs:  Results for orders placed or performed during the hospital encounter of 05/15/15 (from the past 48 hour(s))  Glucose, capillary     Status: Abnormal   Collection Time: 05/25/15  3:45 PM  Result Value Ref Range   Glucose-Capillary 115 (H) 65 - 99 mg/dL   Comment 1 Notify RN    Comment 2 Document in Chart   Glucose, capillary     Status: Abnormal   Collection Time: 05/25/15  7:50 PM  Result Value Ref Range   Glucose-Capillary 109 (H) 65 - 99 mg/dL  Glucose, capillary     Status: Abnormal   Collection Time: 05/25/15 11:01 PM  Result Value Ref Range   Glucose-Capillary 114 (H) 65 - 99 mg/dL  Glucose, capillary     Status: Abnormal   Collection Time: 05/26/15  3:12 AM  Result Value Ref Range   Glucose-Capillary 127 (H) 65 - 99 mg/dL  Triglycerides     Status: None   Collection Time: 05/26/15  4:30 AM  Result Value Ref Range   Triglycerides 77 <150 mg/dL  CBC     Status: None   Collection Time: 05/26/15  4:30 AM  Result Value Ref Range   WBC 9.6 4.0 - 10.5 K/uL   RBC 4.57 4.22 - 5.81 MIL/uL   Hemoglobin 13.0 13.0 - 17.0 g/dL   HCT 40.8 39.0 - 52.0 %   MCV 89.3 78.0 - 100.0 fL   MCH 28.4 26.0 - 34.0 pg   MCHC 31.9 30.0 - 36.0 g/dL   RDW 13.9 11.5 - 15.5 %   Platelets 232 150 - 400 K/uL  Basic metabolic panel     Status: Abnormal   Collection Time: 05/26/15  4:30 AM  Result Value Ref Range   Sodium 148 (H) 135 - 145 mmol/L   Potassium 3.7 3.5 - 5.1 mmol/L   Chloride 108 101 - 111 mmol/L   CO2 31 22 - 32 mmol/L   Glucose, Bld 160 (H) 65 - 99 mg/dL   BUN 30 (H) 6 - 20 mg/dL   Creatinine, Ser 0.77 0.61 - 1.24 mg/dL   Calcium 9.0 8.9 - 10.3 mg/dL   GFR calc non Af Amer >60 >60 mL/min   GFR calc Af Amer >60 >60 mL/min    Comment: (NOTE) The eGFR has been calculated using the CKD EPI equation. This calculation has not been validated in all clinical situations. eGFR's persistently <60 mL/min signify possible Chronic  Kidney Disease.    Anion gap 9 5 - 15  Magnesium     Status: None   Collection Time: 05/26/15  4:30 AM  Result Value Ref Range   Magnesium 2.2 1.7 - 2.4 mg/dL  Phosphorus     Status: None   Collection Time: 05/26/15  4:30 AM  Result Value Ref Range   Phosphorus 3.3 2.5 - 4.6 mg/dL  Glucose, capillary     Status: Abnormal   Collection Time: 05/26/15  7:29 AM  Result Value Ref Range   Glucose-Capillary 139 (H) 65 - 99 mg/dL  Glucose, capillary     Status: Abnormal   Collection Time: 05/26/15 11:50 AM  Result Value Ref Range   Glucose-Capillary 144 (H) 65 - 99 mg/dL  Glucose, capillary     Status: Abnormal   Collection Time: 05/26/15  4:38 PM  Result Value Ref Range   Glucose-Capillary 119 (H) 65 - 99 mg/dL  Glucose, capillary     Status: Abnormal   Collection Time: 05/26/15  7:32 PM  Result Value Ref Range   Glucose-Capillary 104 (H) 65 - 99 mg/dL  CK     Status: None   Collection Time: 05/26/15 11:00 PM  Result Value Ref Range   Total CK 92 49 - 397 U/L  Lactic acid, plasma     Status: None   Collection Time: 05/26/15 11:00 PM  Result Value Ref Range   Lactic Acid, Venous 1.4 0.5 - 2.0 mmol/L  TSH     Status: None   Collection Time: 05/26/15 11:00 PM  Result Value Ref Range   TSH 4.026 0.350 - 4.500 uIU/mL  Glucose, capillary     Status: Abnormal   Collection Time: 05/26/15 11:03 PM  Result Value Ref Range   Glucose-Capillary 123 (H) 65 - 99 mg/dL  Glucose, capillary     Status: Abnormal   Collection Time: 05/27/15  3:31 AM  Result Value Ref Range   Glucose-Capillary 124 (H) 65 - 99 mg/dL  CK     Status: None   Collection Time: 05/27/15  4:47 AM  Result Value Ref Range   Total CK 80 49 - 397 U/L  Comprehensive metabolic panel     Status: Abnormal   Collection Time: 05/27/15  4:47 AM  Result Value Ref Range   Sodium 148 (H) 135 - 145 mmol/L   Potassium 4.1 3.5 - 5.1 mmol/L   Chloride 108 101 - 111 mmol/L   CO2 31 22 - 32 mmol/L   Glucose, Bld 140 (H) 65 - 99  mg/dL   BUN 31 (H) 6 - 20 mg/dL   Creatinine, Ser 0.77 0.61 - 1.24 mg/dL   Calcium 8.8 (L) 8.9 - 10.3 mg/dL   Total Protein 6.4 (L) 6.5 - 8.1 g/dL   Albumin 2.5 (L) 3.5 - 5.0 g/dL   AST 66 (H) 15 - 41 U/L   ALT 95 (H) 17 - 63 U/L   Alkaline Phosphatase 61 38 - 126 U/L   Total Bilirubin 0.7 0.3 - 1.2 mg/dL   GFR calc non Af Amer >60 >60 mL/min   GFR calc Af Amer >60 >60 mL/min    Comment: (NOTE) The eGFR has been calculated using the CKD EPI equation. This calculation has not been validated in all clinical situations. eGFR's persistently <60 mL/min signify possible Chronic Kidney Disease.    Anion gap 9 5 - 15  CBC with Differential/Platelet     Status: Abnormal   Collection Time: 05/27/15  4:47 AM  Result Value Ref Range   WBC 11.4 (H) 4.0 - 10.5 K/uL   RBC 4.67 4.22 - 5.81 MIL/uL   Hemoglobin 13.3 13.0 - 17.0 g/dL   HCT 41.3 39.0 - 52.0 %   MCV 88.4 78.0 - 100.0 fL   MCH 28.5 26.0 - 34.0 pg   MCHC 32.2 30.0 - 36.0 g/dL   RDW 13.7 11.5 - 15.5 %  Platelets 246 150 - 400 K/uL   Neutrophils Relative % 70 %   Neutro Abs 7.9 (H) 1.7 - 7.7 K/uL   Lymphocytes Relative 19 %   Lymphs Abs 2.2 0.7 - 4.0 K/uL   Monocytes Relative 11 %   Monocytes Absolute 1.2 (H) 0.1 - 1.0 K/uL   Eosinophils Relative 0 %   Eosinophils Absolute 0.0 0.0 - 0.7 K/uL   Basophils Relative 0 %   Basophils Absolute 0.0 0.0 - 0.1 K/uL  Magnesium     Status: None   Collection Time: 05/27/15  4:47 AM  Result Value Ref Range   Magnesium 2.2 1.7 - 2.4 mg/dL  Glucose, capillary     Status: Abnormal   Collection Time: 05/27/15  8:19 AM  Result Value Ref Range   Glucose-Capillary 130 (H) 65 - 99 mg/dL   Comment 1 Notify RN    Comment 2 Document in Chart   CK     Status: None   Collection Time: 05/27/15  9:56 AM  Result Value Ref Range   Total CK 71 49 - 397 U/L    Current Facility-Administered Medications  Medication Dose Route Frequency Provider Last Rate Last Dose  . 0.9 %  sodium chloride infusion   250 mL Intravenous PRN Kara Mead V, MD      . 0.9 %  sodium chloride infusion   Intravenous Continuous Lelon Perla, MD      . acetaminophen (TYLENOL) solution 650 mg  650 mg Oral Q4H PRN Rigoberto Noel, MD   650 mg at 05/27/15 0536  . antiseptic oral rinse solution (CORINZ)  7 mL Mouth Rinse QID Allie Bossier, MD   7 mL at 05/27/15 0400  . ceFAZolin (ANCEF) IVPB 2 g/50 mL premix  2 g Intravenous Q8H Kara Mead V, MD   2 g at 05/27/15 0536  . chlorhexidine gluconate (PERIDEX) 0.12 % solution 15 mL  15 mL Mouth Rinse BID Kara Mead V, MD   15 mL at 05/27/15 0800  . clonazePAM (KLONOPIN) tablet 1 mg  1 mg Oral QHS Raylene Miyamoto, MD   1 mg at 05/26/15 2016  . dextrose 5 %-0.45 % sodium chloride infusion   Intravenous Continuous Collene Gobble, MD 10 mL/hr at 05/26/15 2300    . DULoxetine (CYMBALTA) DR capsule 30 mg  30 mg Oral Daily Ram Fuller Mandril, MD   30 mg at 05/27/15 1011  . feeding supplement (VITAL AF 1.2 CAL) liquid 1,000 mL  1,000 mL Per Tube Continuous Rush Farmer, MD 80 mL/hr at 05/27/15 0701 1,000 mL at 05/27/15 0701  . LORazepam (ATIVAN) injection 1-2 mg  1-2 mg Intravenous Q4H PRN Rigoberto Noel, MD   2 mg at 05/22/15 2131  . neomycin-bacitracin-polymyxin (NEOSPORIN) ointment   Topical BID Kara Mead V, MD      . pantoprazole sodium (PROTONIX) 40 mg/20 mL oral suspension 40 mg  40 mg Per Tube Daily Kara Mead V, MD   40 mg at 05/27/15 1011  . valproate (DEPACON) 500 mg in dextrose 5 % 50 mL IVPB  500 mg Intravenous Q12H Marliss Coots, PA-C   500 mg at 05/27/15 1011    Musculoskeletal: Strength & Muscle Tone: decreased Gait & Station: unable to stand Patient leans: N/A  Psychiatric Specialty Exam: Review of Systems  Unable to perform ROS   Blood pressure 120/67, pulse 102, temperature 101 F (38.3 C), temperature source Rectal, resp. rate 21, height _0  (  1.803 m), weight 115.894 kg (255 lb 8 oz), SpO2 94 %.Body mass index is 35.65 kg/(m^2).   General Appearance: NA  Eye Contact::  NA  Speech:  NA  Volume:  To be determined  Mood:  NA  Affect:  NA  Thought Process:  NA  Orientation:  NA  Thought Content:  NA  Suicidal Thoughts:  To be determined  Homicidal Thoughts:  To be determine  Memory:  NA  Judgement:  NA  Insight:  NA  Psychomotor Activity:  NA  Concentration:  NA  Recall:  NA  Fund of Knowledge:NA  Language: NA  Akathisia:  NA  Handed:  Right  AIMS (if indicated):     Assets:  Others:  TBA  ADL's:  Impaired  Cognition: Impaired,  Severe  Sleep:      Treatment Plan Summary:  Patient admitted with bipolar depression, status post seizures, status post intubation and has multiple acute psychiatric hospitalization at [facility] and also [place]. Reportedly patient is noncompliant with medication management while being in detention center/Jail for Violating restraining orders against his ex-fianc.    Patient has  multiple episodes of anger outbursts, intermittent explosive behaviors, suicidal and homicidal threats in the past.    Recommended no psychiatric medication management at this time as patient was not able to eat or drink and at the same time not exhibiting any agitation, aggressive behaviors or emotional difficulties.  Appreciate psychiatric consultation and follow up as clinically required Please contact [phone] or [phone] if needs further assistance  Disposition: Patient will be reevaluated been it is appropriate and medically stable for possibility of psychiatric inpatient stabilization if needed Supportive therapy provided about ongoing stressors.  Durward Parcel., MD 05/27/2015 11:49 AM

## 2015-05-27 NOTE — Progress Notes (Signed)
SLP Cancellation Note  Patient Details Name: Chase Hampton MRN: [id number] DOB: 11/21/1990   Cancelled treatment:       Reason Eval/Treat Not Completed: Patient's level of consciousness   Adreonna Yontz, Riley NearingBonnie Caroline 05/27/2015, 8:52 AM

## 2015-05-28 DIAGNOSIS — R29898 Other symptoms and signs involving the musculoskeletal system: Secondary | ICD-10-CM | POA: Diagnosis present

## 2015-05-28 DIAGNOSIS — M62838 Other muscle spasm: Secondary | ICD-10-CM | POA: Diagnosis present

## 2015-05-28 LAB — CBC
HEMATOCRIT: 42 % (ref 39.0–52.0)
Hemoglobin: 13.5 g/dL (ref 13.0–17.0)
MCH: 28.8 pg (ref 26.0–34.0)
MCHC: 32.1 g/dL (ref 30.0–36.0)
MCV: 89.7 fL (ref 78.0–100.0)
PLATELETS: 239 10*3/uL (ref 150–400)
RBC: 4.68 MIL/uL (ref 4.22–5.81)
RDW: 13.8 % (ref 11.5–15.5)
WBC: 13.1 10*3/uL — AB (ref 4.0–10.5)

## 2015-05-28 LAB — COMPREHENSIVE METABOLIC PANEL
ALK PHOS: 57 U/L (ref 38–126)
ALT: 81 U/L — ABNORMAL HIGH (ref 17–63)
ANION GAP: 9 (ref 5–15)
AST: 48 U/L — AB (ref 15–41)
Albumin: 2.4 g/dL — ABNORMAL LOW (ref 3.5–5.0)
BILIRUBIN TOTAL: 0.6 mg/dL (ref 0.3–1.2)
BUN: 30 mg/dL — AB (ref 6–20)
CO2: 30 mmol/L (ref 22–32)
Calcium: 9 mg/dL (ref 8.9–10.3)
Chloride: 109 mmol/L (ref 101–111)
Creatinine, Ser: 0.72 mg/dL (ref 0.61–1.24)
GFR calc Af Amer: >60 mL/min (ref >60)
Glucose, Bld: 162 mg/dL — ABNORMAL HIGH (ref 65–99)
POTASSIUM: 4 mmol/L (ref 3.5–5.1)
Sodium: 148 mmol/L — ABNORMAL HIGH (ref 135–145)
TOTAL PROTEIN: 5.7 g/dL — AB (ref 6.5–8.1)

## 2015-05-28 LAB — GLUCOSE, CAPILLARY
GLUCOSE-CAPILLARY: 118 mg/dL — AB (ref 65–99)
GLUCOSE-CAPILLARY: 125 mg/dL — AB (ref 65–99)
Glucose-Capillary: 134 mg/dL — ABNORMAL HIGH (ref 65–99)
Glucose-Capillary: 127 mg/dL — ABNORMAL HIGH (ref 65–99)
Glucose-Capillary: 118 mg/dL — ABNORMAL HIGH (ref 65–99)
Glucose-Capillary: 113 mg/dL — ABNORMAL HIGH (ref 65–99)

## 2015-05-28 LAB — URINE CULTURE: CULTURE: NO GROWTH

## 2015-05-28 LAB — CK: CK TOTAL: 87 U/L (ref 49–397)

## 2015-05-28 MED ORDER — AMANTADINE HCL 50 MG/5ML PO SYRP
100.0000 mg | ORAL_SOLUTION | Freq: Two times a day (BID) | ORAL | Status: DC
  Filled 2015-05-28 (×2): qty 10

## 2015-05-28 MED ORDER — FREE WATER
300.0000 mL | Freq: Four times a day (QID) | Status: DC
  Administered 2015-05-28 – 2015-06-06 (×37): 300 mL

## 2015-05-28 MED ORDER — BACLOFEN 5 MG HALF TABLET: 5.0000 mg | ORAL_TABLET | Freq: Three times a day (TID) | ORAL | Status: DC

## 2015-05-28 NOTE — Progress Notes (Signed)
[place] TEAM 1 - Stepdown/ICU TEAM PROGRESS NOTE  Chase GivensStewart R Hampton ZOX:[id number] DOB: 05/28/1990 DOA: 05/15/2015 PCP: Chase LevyJames Hawkins Jr, MD  Admit HPI / Brief Narrative: 25 y/o Mw/ a Hx of Anxiety, Depression ([facility]) with [facility] [place] from 2/3 > 2/8 for major depressive disorder with homicidal / suicidal ideation after a break up with his fiancee. Due to restraining order violations, [facility] were he had been on suicide watch. The patient reportedly refused to eat, drink or take his medications. He reportedly would only lay in the floor and urinate on himself. After multiple episodes of urinating on himself, he was asked to mop up the floor, which he was able to perform. [facility], notes reflect he had multiple episodes of physically aggressive behavior - including striking his own head on the seclusion room walls. At discharge he was to continue on Cymbalta, Depakote and Saphris. Prison staff report the patient refused all medications.   On 2/19, the patient was noted to have a seizure in jail. [facility]. It is documented that he arrived to the ER with urine and feces all over him. There is question if patient was weaning off ativan?? [facility] but [facility] Rx for at[facility]. Room air saturations were 82%, he was treated with 100% NRB. He was treated with 4mg  IV versed per EMS en route. The patient had a second seizure in ER and was ultimately intubated. [facility].  [facility], mechanically intubated.   Significant Events: 2/19 Admit from jail with seizure, intubated  2/20 LP > negative for infection  2/20 EEG > No active seizures.  2/21 Repeat EEG > no seizure activity 2/21 TEE normal  2/22 focal seizures mouth 2/27 MRI brain wo contrast  normal 2/27 MRI T-spine/C-spine - Edema posterior paraspinal musculature - minimal amount of retropharyngeal edema - Nonspecific posterior paravertebral edema extends into the upper thoracic region - Multiple bulging disks with cord flattening   HPI/Subjective: The pt remains essentially unresponsive/unable to provide any hx.  He does open his eyes today, and will wiggle his fingers or toes on command (wiggle the toes on your R foot).    Assessment/Plan:  FUO (103.8 05/26/15) - ?NMS -NMS?v/s infectious process? vs medication withdrawal? -Patient currently on antibiotic for Staph hominis -Neuro following again - bromocriptine and amantadine initiated  -temp improving w/ cooling blanket   Altered mental status -Patient has spiking fevers, catatonia, tachycardia, and rigidity - all of which are consistent with NMS - initiate bromocriptine - avoid serotonin active agents or antipsychotics   Staph hominis bacteremia 2/2 upon [facility] 05/15/2015 - TEE negative and LP benign - felt to be a genuine bacteremia per ID - ID recommended a 2 week course of tx w/ IV abx (through May 29, 2015) - ID signed off 2/23  Acute Hypoxic Respiratory Failure - Resolved  -Titrate O2 for sat of 88-92%  Hypernatremia  -appears to be due to hypovolemia - increase free water further and follow   Hypotension  -resolved  Rhabdomyolysis - resolved  - admit CK >7500 - resolved in f/u at 71  Bipolar D/O w/ Depression > Catatonia  -noncompliant w/ medications while incarcerated > withdrawal  Code Status: FULL Family Communication: Spoke with mother and father bedside at length  again today  Disposition Plan: SDU  Consultants: Psychiatry PCCM Neurology  ID  Antibiotics: Rocephin 2/19 > 2/22 Vanco 2/20 > 2/22 Cefazolin 2/22 >  DVT prophylaxis: lovenox   Objective: Blood pressure 115/69, pulse 109, temperature 101.2 F (38.4 C), temperature source Rectal, resp. rate 21, height   (1.803 m), weight 112.3 kg (247 lb 9.2 oz), SpO2 96 %.  Intake/Output Summary (Last 24 hours) at 05/28/15 1444 Last data filed at 05/28/15 1217  Gross per 24 hour  Intake 2849.83 ml  Output   1375 ml  Net 1474.83 ml   Exam: General: No acute respiratory distress - more reactive today though only slightly so  Lungs: Clear to auscultation bilaterally  Cardiovascular: tachycardic - regular - no M  Abdomen: nondistended, soft, bowel sounds positive, no rebound, no ascites, no appreciable mass Extremities: no significant cyanosis, or clubbing - trace edema bilateral lower extremities - muscles of upper and lower extrimities modestly rigid/firm but pt able to move them   Data Reviewed:  Basic Metabolic Panel:  Recent Labs Lab 05/24/15 0319 05/25/15 0410 05/26/15 0430 05/27/15 0447 05/28/15 0532  NA 148* 148* 148* 148* 148*  K 3.9 3.7 3.7 4.1 4.0  CL 105 105 108 108 109  CO2 31 32 GLUCOSE 144* 125* 160* 140* 162*  BUN 40* 34* 30* 31* 30*  CREATININE 0.76 0.70 0.77 0.77 0.72  CALCIUM 9.2 9.1 9.0 8.8* 9.0  MG 2.1 2.3 2.2 2.2  --   PHOS 3.9 3.5 3.3  --   --     CBC:  Recent Labs Lab 05/24/15 0319 05/25/15 0410 05/26/15 0430 05/27/15 0447 05/28/15 0532  WBC 9.9 11.5* 9.6 11.4* 13.1*  NEUTROABS  --   --   --  7.9*  --   HGB 13.0 13.0 13.0 13.3 13.5  HCT 40.3 40.6 40.8 41.3 42.0  MCV 87.8 88.5 89.3 88.4 89.7  PLT 165 192 232 246 239    Liver Function Tests:  Recent Labs Lab 05/27/15 0447 05/28/15 0532  AST 66* 48*  ALT 95* 81*  ALKPHOS 61 57  BILITOT 0.7 0.6  PROT 6.4* 5.7*  ALBUMIN 2.5* 2.4*    Recent Labs Lab 05/23/15 1153  AMMONIA 38*   Cardiac Enzymes:  Recent Labs Lab 05/26/15 2300 05/27/15 0447 05/27/15 0956 05/27/15 1538 05/28/15 0532  CKTOTAL 92 80 71 68 87    CBG:  Recent Labs Lab 05/27/15 1908 05/28/15 0043 05/28/15 0338 05/28/15 0746 05/28/15 1150  GLUCAP 117* 134* 118* 127* 118*    Recent Results (from the  past 240 hour(s))  Culture, blood (routine x 2)     Status: None (Preliminary result)   Collection Time: 05/26/15 10:34 PM  Result Value Ref Range Status   Specimen Description BLOOD LEFT HAND  Final   Special Requests BOTTLES DRAWN AEROBIC ONLY 5CC  Final   Culture NO GROWTH 1 DAY  Final   Report Status PENDING  Incomplete  Culture, blood (routine x 2)     Status: None (Preliminary result)   Collection Time: 05/26/15 10:44 PM  Result Value Ref Range Status   Specimen Description BLOOD RIGHT HAND  Final   Special Requests BOTTLES DRAWN AEROBIC AND ANAEROBIC 5CC   Final   Culture NO GROWTH 1 DAY  Final   Report Status PENDING  Incomplete  Culture, Urine     Status: None   Collection Time: 05/27/15  4:45 AM  Result Value Ref Range Status   Specimen  Description URINE, CATHETERIZED  Final   Special Requests NONE  Final   Culture NO GROWTH 1 DAY  Final   Report Status 05/28/2015 FINAL  Final     Studies:   Recent x-ray studies have been reviewed in detail by the Attending Physician  Scheduled Meds:  Scheduled Meds: . amantadine  100 mg Oral BID  . antiseptic oral rinse  7 mL Mouth Rinse QID  . bromocriptine  2.5 mg Per Tube QID  .  ceFAZolin (ANCEF) IV  2 g Intravenous Q8H  . chlorhexidine gluconate  15 mL Mouth Rinse BID  . clonazePAM  1 mg Per Tube QHS  . DULoxetine  30 mg Oral Daily  . enoxaparin (LOVENOX) injection  40 mg Subcutaneous Q24H  . free water  200 mL Per Tube 4 times per day  . neomycin-bacitracin-polymyxin   Topical BID  . valproate sodium  500 mg Intravenous Q12H    Time spent on care of this patient: 35 mins   Kessa Fairbairn T , MD   [facility] Office  [phone] Pager - Text Page per Loretha Stapler as per below:  On-Call/Text Page:      [email or web address]      password [email or web address]  If 7PM-7AM, please contact night-coverage [email or web address] Password [email or web address] 05/28/2015, 2:44 PM   LOS: 13 days

## 2015-05-28 NOTE — Progress Notes (Signed)
SLP Cancellation Note  Patient Details Name: Chase Hampton MRN: [id number] DOB: 09/20/1990   Cancelled treatment:       Reason Eval/Treat Not Completed: Patient's level of consciousness. Per RN, not alert enough for evaluation.  Ferdinand LangoLeah Tyiesha Brackney MA, CCC-SLP [phone]    Ferdinand LangoMcCoy Kristilyn Coltrane Meryl 05/28/2015, 12:19 PM

## 2015-05-28 NOTE — Progress Notes (Signed)
Interval History:                                                                                                                      Chase Hampton is an 25 y.o. male patient with improved limb rigidity, also slightly improved level of alertness. His following commands easily. 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  We'll get physical therapy.   Past Medical History: Past Medical History  Diagnosis Date  . GERD (gastroesophageal reflux disease)   . Anxiety   . Depression     Past Surgical History  Procedure Laterality Date  . No past surgeries      Family History: Family History  Problem Relation Age of Onset  . Heart disease Maternal Grandfather   . Diabetes Maternal Grandfather   . Heart disease Paternal Grandmother     Social History:   reports that he has quit smoking. His smoking use included Cigars. He has never used smokeless tobacco. He reports that he uses illicit drugs (Marijuana). He reports that he does not drink alcohol.  Allergies:  Allergies  Allergen Reactions  . Haldol [Haloperidol Lactate] Other (See Comments)    Unknown reaction [facility] lists no allergies as of 05/15/15)     Medications:                                                                                                                         Current facility-administered medications:  .  acetaminophen (TYLENOL) solution 650 mg, 650 mg, Per Tube, Q4H PRN, Lonia Blood, MD, 650 mg at 05/28/15 1213 .  [START ON 05/29/2015] amantadine (SYMMETREL) solution 100 mg, 100 mg, Per Tube, BID, Lonia Blood, MD .  antiseptic oral rinse solution (CORINZ), 7 mL, Mouth Rinse, QID, Drema Dallas, MD, 7 mL at 05/28/15 1646 .  baclofen (LIORESAL) tablet 5 mg, 5 mg, Oral, TID, Bliss Tsang Daniel Nones, MD .  bromocriptine (PARLODEL) tablet 2.5 mg, 2.5 mg, Per Tube, QID,  Lonia Blood, MD, 2.5 mg at 05/28/15 2154 .  ceFAZolin (ANCEF) IVPB 2 g/50 mL premix, 2 g, Intravenous, Q8H, Cyril Mourning V, MD, 2 g at 05/28/15 2154 .  chlorhexidine gluconate (PERIDEX) 0.12 % solution 15 mL, 15 mL, Mouth Rinse, BID, Cyril Mourning V, MD, 15 mL at 05/28/15 2000 .  clonazePAM (KLONOPIN) tablet 1 mg, 1  mg, Per Tube, QHS, Lonia Blood, MD, 1 mg at 05/28/15 2154 .  dextrose 5 %-0.45 % sodium chloride infusion, , Intravenous, Continuous, Leslye Peer, MD, Last Rate: 10 mL/hr at 05/28/15 0400 .  enoxaparin (LOVENOX) injection 40 mg, 40 mg, Subcutaneous, Q24H, Lonia Blood, MD, 40 mg at 05/28/15 2153 .  feeding supplement (VITAL AF 1.2 CAL) liquid 1,000 mL, 1,000 mL, Per Tube, Continuous, Alyson Reedy, MD, Last Rate: 80 mL/hr at 05/28/15 0925, 1,000 mL at 05/28/15 0925 .  free water 300 mL, 300 mL, Per Tube, 4 times per day, Lonia Blood, MD, 300 mL at 05/28/15 1646 .  LORazepam (ATIVAN) injection 1-2 mg, 1-2 mg, Intravenous, Q4H PRN, Cyril Mourning V, MD, 2 mg at 05/22/15 2131 .  neomycin-bacitracin-polymyxin (NEOSPORIN) ointment, , Topical, BID, Cyril Mourning V, MD .  valproate (DEPACON) 500 mg in dextrose 5 % 50 mL IVPB, 500 mg, Intravenous, Q12H, Ulice Dash, PA-C, 500 mg at 05/28/15 2154   Neurologic Examination:                                                                                                      Blood pressure 113/66, pulse 97, temperature 100.8 F (38.2 C), temperature source Rectal, resp. rate 20, height 5\' 11"  (1.803 m), weight 112.3 kg (247 lb 9.2 oz), SpO2 96 %.  Has spontaneous eye opening, followed commands easily, elevated his bilateral upper extremities to command without difficulty compared to yesterday. He continues to have rigidity in bilateral upper extremity is also slightly improved compared to yesterday.  Lab Results: Basic Metabolic Panel:  Recent Labs Lab 05/24/15 0319 05/25/15 0410 05/26/15 0430 05/27/15 0447  05/28/15 0532  NA 148* 148* 148* 148* 148*  K 3.9 3.7 3.7 4.1 4.0  CL 105 105 108 108 109  CO2 31 32 31 31 30   GLUCOSE 144* 125* 160* 140* 162*  BUN 40* 34* 30* 31* 30*  CREATININE 0.76 0.70 0.77 0.77 0.72  CALCIUM 9.2 9.1 9.0 8.8* 9.0  MG 2.1 2.3 2.2 2.2  --   PHOS 3.9 3.5 3.3  --   --     Liver Function Tests:  Recent Labs Lab 05/27/15 0447 05/28/15 0532  AST 66* 48*  ALT 95* 81*  ALKPHOS 61 57  BILITOT 0.7 0.6  PROT 6.4* 5.7*  ALBUMIN 2.5* 2.4*   No results for input(s): LIPASE, AMYLASE in the last 168 hours.  Recent Labs Lab 05/23/15 1153  AMMONIA 38*    CBC:  Recent Labs Lab 05/24/15 0319 05/25/15 0410 05/26/15 0430 05/27/15 0447 05/28/15 0532  WBC 9.9 11.5* 9.6 11.4* 13.1*  NEUTROABS  --   --   --  7.9*  --   HGB 13.0 13.0 13.0 13.3 13.5  HCT 40.3 40.6 40.8 41.3 42.0  MCV 87.8 88.5 89.3 88.4 89.7  PLT 165 192 232 246 239    Cardiac Enzymes:  Recent Labs Lab 05/26/15 2300 05/27/15 0447 05/27/15 0956 05/27/15 1538 05/28/15 0532  CKTOTAL 92 80 71 68 87    Lipid Panel:  Recent  Labs Lab 05/23/15 0648 05/26/15 0430  TRIG 98 77    CBG:  Recent Labs Lab 05/28/15 0338 05/28/15 0746 05/28/15 1150 05/28/15 1631 05/28/15 1909  GLUCAP 118* 127* 118* 113* 125*    Microbiology: Results for orders placed or performed during the hospital encounter of 05/15/15  MRSA PCR Screening     Status: None   Collection Time: 05/15/15  3:49 PM  Result Value Ref Range Status   MRSA by PCR NEGATIVE NEGATIVE Final    Comment:        The GeneXpert MRSA Assay (FDA approved for NASAL specimens only), is one component of a comprehensive MRSA colonization surveillance program. It is not intended to diagnose MRSA infection nor to guide or monitor treatment for MRSA infections.   Culture, blood (routine x 2)     Status: None   Collection Time: 05/15/15  9:53 PM  Result Value Ref Range Status   Specimen Description BLOOD RIGHT ANTECUBITAL  Final    Special Requests BOTTLES DRAWN AEROBIC ONLY 5CC  Final   Culture NO GROWTH 5 DAYS  Final   Report Status 05/20/2015 FINAL  Final  Culture, blood (routine x 2)     Status: None   Collection Time: 05/15/15 10:06 PM  Result Value Ref Range Status   Specimen Description BLOOD RIGHT ANTECUBITAL  Final   Special Requests BOTTLES DRAWN AEROBIC ONLY 5CC  Final   Culture NO GROWTH 5 DAYS  Final   Report Status 05/20/2015 FINAL  Final  CSF culture     Status: None   Collection Time: 05/17/15  3:01 PM  Result Value Ref Range Status   Specimen Description CSF  Final   Special Requests NONE  Final   Gram Stain   Final    CYTOSPIN SMEAR WBC PRESENT, PREDOMINANTLY MONONUCLEAR NO ORGANISMS SEEN    Culture NO GROWTH 3 DAYS  Final   Report Status 05/20/2015 FINAL  Final  Culture, blood (Routine X 2) w Reflex to ID Panel     Status: None   Collection Time: 05/18/15 10:10 AM  Result Value Ref Range Status   Specimen Description BLOOD RIGHT HAND  Final   Special Requests IN PEDIATRIC BOTTLE 3CC  Final   Culture NO GROWTH 5 DAYS  Final   Report Status 05/23/2015 FINAL  Final  Culture, blood (Routine X 2) w Reflex to ID Panel     Status: None   Collection Time: 05/18/15 10:15 AM  Result Value Ref Range Status   Specimen Description BLOOD LEFT HAND  Final   Special Requests IN PEDIATRIC BOTTLE 3CC  Final   Culture NO GROWTH 5 DAYS  Final   Report Status 05/23/2015 FINAL  Final  Culture, blood (routine x 2)     Status: None (Preliminary result)   Collection Time: 05/26/15 10:34 PM  Result Value Ref Range Status   Specimen Description BLOOD LEFT HAND  Final   Special Requests BOTTLES DRAWN AEROBIC ONLY 5CC  Final   Culture NO GROWTH 1 DAY  Final   Report Status PENDING  Incomplete  Culture, blood (routine x 2)     Status: None (Preliminary result)   Collection Time: 05/26/15 10:44 PM  Result Value Ref Range Status   Specimen Description BLOOD RIGHT HAND  Final   Special Requests BOTTLES DRAWN  AEROBIC AND ANAEROBIC 5CC   Final   Culture NO GROWTH 1 DAY  Final   Report Status PENDING  Incomplete  Culture, Urine  Status: None   Collection Time: 05/27/15  4:45 AM  Result Value Ref Range Status   Specimen Description URINE, CATHETERIZED  Final   Special Requests NONE  Final   Culture NO GROWTH 1 DAY  Final   Report Status 05/28/2015 FINAL  Final    Imaging: Dg Chest Port 1 View  05/26/2015  CLINICAL DATA:  Respiratory abnormalities. EXAM: PORTABLE CHEST 1 VIEW COMPARISON:  05/24/2015 FINDINGS: Endotracheal tube has been removed. Feeding tube is in place, tip overlying the region of the stomach. Shallow lung inflation. There is minimal left basilar atelectasis. Heart size is upper limits normal. No focal consolidations or evidence for pulmonary edema. IMPRESSION: 1. Shallow inflation. 2. Left lower lobe atelectasis. Electronically Signed   By: Norva Pavlov M.D.   On: 05/26/2015 15:39    Assessment and plan:   DAEGEN BERROCAL is an 25 y.o. 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 The rigidity is somewhat improved with the current regimen of amantadine 100 mg twice daily after breakfast lunch and bromocriptine 2.5 mg 4 times a day. Recommend to continue the same. He is also on clonazepam 1 mg bedtime, Depakote 500 mg twice a day. Baclofen 5 mg 3 times a day to his regimen. Discussed his neurologic assessment with his parents at bedside. They're happy to see the improvement that he is able to follow commands easily and elevate his limbs better than yesterday.  We'll follow-up

## 2015-05-29 LAB — GLUCOSE, CAPILLARY
GLUCOSE-CAPILLARY: 109 mg/dL — AB (ref 65–99)
GLUCOSE-CAPILLARY: 108 mg/dL — AB (ref 65–99)
Glucose-Capillary: 110 mg/dL — ABNORMAL HIGH (ref 65–99)
Glucose-Capillary: 118 mg/dL — ABNORMAL HIGH (ref 65–99)
Glucose-Capillary: 122 mg/dL — ABNORMAL HIGH (ref 65–99)
Glucose-Capillary: 122 mg/dL — ABNORMAL HIGH (ref 65–99)

## 2015-05-29 LAB — CK: CK TOTAL: 64 U/L (ref 49–397)

## 2015-05-29 LAB — CBC
HCT: 41.8 % (ref 39.0–52.0)
Hemoglobin: 13.2 g/dL (ref 13.0–17.0)
MCH: 28.4 pg (ref 26.0–34.0)
MCHC: 31.6 g/dL (ref 30.0–36.0)
MCV: 90.1 fL (ref 78.0–100.0)
Platelets: 232 10*3/uL (ref 150–400)
RBC: 4.64 MIL/uL (ref 4.22–5.81)
RDW: 14 % (ref 11.5–15.5)
WBC: 11.4 10*3/uL — AB (ref 4.0–10.5)

## 2015-05-29 LAB — COMPREHENSIVE METABOLIC PANEL
ALT: 64 U/L — ABNORMAL HIGH (ref 17–63)
ANION GAP: 10 (ref 5–15)
AST: 35 U/L (ref 15–41)
Albumin: 2.5 g/dL — ABNORMAL LOW (ref 3.5–5.0)
Alkaline Phosphatase: 55 U/L (ref 38–126)
BUN: 29 mg/dL — ABNORMAL HIGH (ref 6–20)
CALCIUM: 8.9 mg/dL (ref 8.9–10.3)
CHLORIDE: 109 mmol/L (ref 101–111)
CO2: 30 mmol/L (ref 22–32)
CREATININE: 0.66 mg/dL (ref 0.61–1.24)
GFR calc non Af Amer: >60 mL/min (ref >60)
Glucose, Bld: 135 mg/dL — ABNORMAL HIGH (ref 65–99)
POTASSIUM: 4.3 mmol/L (ref 3.5–5.1)
SODIUM: 149 mmol/L — AB (ref 135–145)
Total Bilirubin: 0.3 mg/dL (ref 0.3–1.2)
Total Protein: 5.9 g/dL — ABNORMAL LOW (ref 6.5–8.1)

## 2015-05-29 MED ORDER — AMANTADINE HCL 50 MG/5ML PO SYRP
100.0000 mg | ORAL_SOLUTION | Freq: Three times a day (TID) | ORAL | Status: DC
  Administered 2015-05-29 – 2015-06-07 (×28): 100 mg
  Filled 2015-05-29 (×31): qty 10

## 2015-05-29 MED ORDER — CLONAZEPAM 0.5 MG PO TABS
0.2500 mg | ORAL_TABLET | Freq: Four times a day (QID) | ORAL | Status: DC
  Administered 2015-05-29 – 2015-06-05 (×31): 0.25 mg
  Filled 2015-05-29 (×31): qty 1

## 2015-05-29 MED ORDER — BACLOFEN 10 MG PO TABS
10.0000 mg | ORAL_TABLET | Freq: Three times a day (TID) | ORAL | Status: DC
  Administered 2015-05-29 – 2015-06-09 (×35): 10 mg via ORAL
  Filled 2015-05-29 (×36): qty 1

## 2015-05-29 MED ORDER — ACETAMINOPHEN 160 MG/5ML PO SOLN
500.0000 mg | ORAL | Status: DC | PRN
  Administered 2015-05-30 – 2015-06-03 (×4): 500 mg
  Administered 2015-06-12: 650 mg
  Filled 2015-05-29 (×7): qty 20.3

## 2015-05-29 MED ORDER — DIVALPROEX SODIUM 250 MG PO DR TAB
250.0000 mg | DELAYED_RELEASE_TABLET | Freq: Three times a day (TID) | ORAL | Status: DC
  Administered 2015-05-29 (×2): 250 mg via ORAL
  Filled 2015-05-29 (×5): qty 1
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
## 2015-05-29 NOTE — Progress Notes (Signed)
Interval History:                                                                                                                      Chase Hampton is an 25 y.o. 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  Patient is drowsy , unable to provide any history.   No family at bedside.    Past Medical History: Past Medical History  Diagnosis Date  . GERD (gastroesophageal reflux disease)   . Anxiety   . Depression     Past Surgical History  Procedure Laterality Date  . No past surgeries      Family History: Family History  Problem Relation Age of Onset  . Heart disease Maternal Grandfather   . Diabetes Maternal Grandfather   . Heart disease Paternal Grandmother     Social History:   reports that he has quit smoking. His smoking use included Cigars. He has never used smokeless tobacco. He reports that he uses illicit drugs (Marijuana). He reports that he does not drink alcohol.  Allergies:  Allergies  Allergen Reactions  . Haldol [Haloperidol Lactate] Other (See Comments)    Unknown reaction [facility] lists no allergies as of 05/15/15)     Medications:                                                                                                                         Current facility-administered medications:  .  acetaminophen (TYLENOL) solution 650 mg, 650 mg, Per Tube, Q4H PRN, Lonia Blood, MD, 650 mg at 05/28/15 1213 .  amantadine (SYMMETREL) solution 100 mg, 100 mg, Per Tube, TID, Almalik Weissberg Daniel Nones, MD .  antiseptic oral rinse solution (CORINZ), 7 mL, Mouth Rinse, QID, Drema Dallas, MD, 7 mL at 05/29/15 0400 .  baclofen (LIORESAL) tablet 10 mg, 10 mg, Oral, TID,  Cohick Daniel Nones, MD .  bromocriptine (PARLODEL) tablet 2.5 mg, 2.5 mg, Per Tube, QID, Lonia Blood, MD, 2.5 mg at 05/28/15 2154 .  ceFAZolin (ANCEF) IVPB 2 g/50 mL premix, 2 g, Intravenous, Q8H, Cyril Mourning V,  MD, 2 g at 05/29/15 0707 .  chlorhexidine gluconate (PERIDEX) 0.12 % solution 15 mL, 15 mL, Mouth Rinse, BID, Cyril Mourning V, MD, 15 mL at 05/28/15 2000 .  clonazePAM (KLONOPIN) tablet 0.25 mg, 0.25 mg, Per Tube, QID, Colbin Jovel Daniel Nones, MD .  dextrose 5 %-0.45 % sodium chloride infusion, ,  Intravenous, Continuous, Leslye Peer, MD, Last Rate: 10 mL/hr at 05/28/15 2000 .  divalproex (DEPAKOTE) DR tablet 250 mg, 250 mg, Oral, 3 times per day, Jemimah Cressy Daniel Nones, MD .  enoxaparin (LOVENOX) injection 40 mg, 40 mg, Subcutaneous, Q24H, Lonia Blood, MD, 40 mg at 05/28/15 2153 .  feeding supplement (VITAL AF 1.2 CAL) liquid 1,000 mL, 1,000 mL, Per Tube, Continuous, Alyson Reedy, MD, Last Rate: 80 mL/hr at 05/28/15 2000, 1,000 mL at 05/28/15 2000 .  free water 300 mL, 300 mL, Per Tube, 4 times per day, Lonia Blood, MD, 300 mL at 05/29/15 0600 .  LORazepam (ATIVAN) injection 1-2 mg, 1-2 mg, Intravenous, Q4H PRN, Cyril Mourning V, MD, 2 mg at 05/22/15 2131 .  neomycin-bacitracin-polymyxin (NEOSPORIN) ointment, , Topical, BID, Cyril Mourning V, MD   Neurologic Examination:                                                                                                      Blood pressure 114/60, pulse 101, temperature 100.5 F (38.1 C), temperature source Rectal, resp. rate 19, height  (1.803 m), weight 116.4 kg (256 lb 9.9 oz), SpO2 96 %.   patient is sleeping , open his eyes to tactile  stimulus. Able to elevate his bilateral upper extremites to command,  Mild improvement of the abnormal muscle tone seen in bilateral upper extremities.  A gaze deviation or abnormal involuntary movements noted.  Does not follow commands consistently,  Nonverbal.   Lab Results: Basic Metabolic Panel:  Recent Labs Lab 05/24/15 0319 05/25/15 0410 05/26/15 0430 05/27/15 0447 05/28/15 0532 05/29/15 0324  NA 148* 148* 148* 148* 148* 149*  K 3.9 3.7 3.7 4.1 4.0 4.3  CL 105 105 108 108  109 109  CO2 31 32 GLUCOSE 144* 125* 160* 140* 162* 135*  BUN 40* 34* 30* 31* 30* 29*  CREATININE 0.76 0.70 0.77 0.77 0.72 0.66  CALCIUM 9.2 9.1 9.0 8.8* 9.0 8.9  MG 2.1 2.3 2.2 2.2  --   --   PHOS 3.9 3.5 3.3  --   --   --     Liver Function Tests:  Recent Labs Lab 05/27/15 0447 05/28/15 0532 05/29/15 0324  AST 66* 48* 35  ALT 95* 81* 64*  ALKPHOS 61 57 55  BILITOT 0.7 0.6 0.3  PROT 6.4* 5.7* 5.9*  ALBUMIN 2.5* 2.4* 2.5*   No results for input(s): LIPASE, AMYLASE in the last 168 hours.  Recent Labs Lab 05/23/15 1153  AMMONIA 38*    CBC:  Recent Labs Lab 05/25/15 0410 05/26/15 0430 05/27/15 0447 05/28/15 0532 05/29/15 0324  WBC 11.5* 9.6 11.4* 13.1* 11.4*  NEUTROABS  --   --  7.9*  --   --   HGB 13.0 13.0 13.3 13.5 13.2  HCT 40.6 40.8 41.3 42.0 41.8  MCV 88.5 89.3 88.4 89.7 90.1  PLT 192 232 246 239 232    Cardiac Enzymes:  Recent Labs Lab 05/27/15 0447 05/27/15 0956 05/27/15 1538 05/28/15 0532 05/29/15 0324  CKTOTAL  80 71 68 87 64    Lipid Panel:  Recent Labs Lab 05/23/15 0648 05/26/15 0430  TRIG 98 77    CBG:  Recent Labs Lab 05/28/15 1631 05/28/15 1909 05/28/15 2336 05/29/15 0300 05/29/15 0824  GLUCAP 113* 125* 122* 118* 122*    Microbiology: Results for orders placed or performed during the hospital encounter of 05/15/15  MRSA PCR Screening     Status: None   Collection Time: 05/15/15  3:49 PM  Result Value Ref Range Status   MRSA by PCR NEGATIVE NEGATIVE Final    Comment:        The GeneXpert MRSA Assay (FDA approved for NASAL specimens only), is one component of a comprehensive MRSA colonization surveillance program. It is not intended to diagnose MRSA infection nor to guide or monitor treatment for MRSA infections.   Culture, blood (routine x 2)     Status: None   Collection Time: 05/15/15  9:53 PM  Result Value Ref Range Status   Specimen Description BLOOD RIGHT ANTECUBITAL  Final   Special  Requests BOTTLES DRAWN AEROBIC ONLY 5CC  Final   Culture NO GROWTH 5 DAYS  Final   Report Status 05/20/2015 FINAL  Final  Culture, blood (routine x 2)     Status: None   Collection Time: 05/15/15 10:06 PM  Result Value Ref Range Status   Specimen Description BLOOD RIGHT ANTECUBITAL  Final   Special Requests BOTTLES DRAWN AEROBIC ONLY 5CC  Final   Culture NO GROWTH 5 DAYS  Final   Report Status 05/20/2015 FINAL  Final  CSF culture     Status: None   Collection Time: 05/17/15  3:01 PM  Result Value Ref Range Status   Specimen Description CSF  Final   Special Requests NONE  Final   Gram Stain   Final    CYTOSPIN SMEAR WBC PRESENT, PREDOMINANTLY MONONUCLEAR NO ORGANISMS SEEN    Culture NO GROWTH 3 DAYS  Final   Report Status 05/20/2015 FINAL  Final  Culture, blood (Routine X 2) w Reflex to ID Panel     Status: None   Collection Time: 05/18/15 10:10 AM  Result Value Ref Range Status   Specimen Description BLOOD RIGHT HAND  Final   Special Requests IN PEDIATRIC BOTTLE 3CC  Final   Culture NO GROWTH 5 DAYS  Final   Report Status 05/23/2015 FINAL  Final  Culture, blood (Routine X 2) w Reflex to ID Panel     Status: None   Collection Time: 05/18/15 10:15 AM  Result Value Ref Range Status   Specimen Description BLOOD LEFT HAND  Final   Special Requests IN PEDIATRIC BOTTLE 3CC  Final   Culture NO GROWTH 5 DAYS  Final   Report Status 05/23/2015 FINAL  Final  Culture, blood (routine x 2)     Status: None (Preliminary result)   Collection Time: 05/26/15 10:34 PM  Result Value Ref Range Status   Specimen Description BLOOD LEFT HAND  Final   Special Requests BOTTLES DRAWN AEROBIC ONLY 5CC  Final   Culture NO GROWTH 1 DAY  Final   Report Status PENDING  Incomplete  Culture, blood (routine x 2)     Status: None (Preliminary result)   Collection Time: 05/26/15 10:44 PM  Result Value Ref Range Status   Specimen Description BLOOD RIGHT HAND  Final   Special Requests BOTTLES DRAWN AEROBIC  AND ANAEROBIC 5CC   Final   Culture NO GROWTH 1 DAY  Final  Report Status PENDING  Incomplete  Culture, Urine     Status: None   Collection Time: 05/27/15  4:45 AM  Result Value Ref Range Status   Specimen Description URINE, CATHETERIZED  Final   Special Requests NONE  Final   Culture NO GROWTH 1 DAY  Final   Report Status 05/28/2015 FINAL  Final    Imaging: Dg Chest Port 1 View  05/26/2015  CLINICAL DATA:  Respiratory abnormalities. EXAM: PORTABLE CHEST 1 VIEW COMPARISON:  05/24/2015 FINDINGS: Endotracheal tube has been removed. Feeding tube is in place, tip overlying the region of the stomach. Shallow lung inflation. There is minimal left basilar atelectasis. Heart size is upper limits normal. No focal consolidations or evidence for pulmonary edema. IMPRESSION: 1. Shallow inflation. 2. Left lower lobe atelectasis. Electronically Signed   By: Norva Pavlov M.D.   On: 05/26/2015 15:39    Assessment and plan:   Chase Hampton is an 25 y.o. 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  He is on several other neurological medications to help with this rigidity. Recommend changing the bedtime clonazepam 1 mg to 0.25 mg every 6 hours given the total dose same in a 24-hour period,  Increase amantadine dose to 100 mg 3 times a day,  Increase baclofen to 10 mg 3 times a day,  Continue same dose of bromocriptine,  I recommend reducing the Depakote dose 250 mg every 8 hours to help with sedation.   continue physical and occupational therapy.  He may need to be discharged to a skilled nursing facility continue therapy.  we'll follow-up.  Please call for any further questions.

## 2015-05-29 NOTE — Progress Notes (Signed)
SLP Cancellation Note  Patient Details Name: Chase GivensStewart R Hampton MRN: [id number] DOB: 11/14/1990   Cancelled treatment:       Reason Eval/Treat Not Completed: Patient's level of consciousness  Dimas AguasMelissa Antonius Hartlage, MA, CCC-SLP Acute Rehab SLP [phone]

## 2015-05-30 LAB — GLUCOSE, CAPILLARY
GLUCOSE-CAPILLARY: 112 mg/dL — AB (ref 65–99)
GLUCOSE-CAPILLARY: 112 mg/dL — AB (ref 65–99)
GLUCOSE-CAPILLARY: 114 mg/dL — AB (ref 65–99)
GLUCOSE-CAPILLARY: 122 mg/dL — AB (ref 65–99)
GLUCOSE-CAPILLARY: 128 mg/dL — AB (ref 65–99)
GLUCOSE-CAPILLARY: 135 mg/dL — AB (ref 65–99)
Glucose-Capillary: 114 mg/dL — ABNORMAL HIGH (ref 65–99)

## 2015-05-30 LAB — CBC
HEMATOCRIT: 37.8 % — AB (ref 39.0–52.0)
HEMOGLOBIN: 12.4 g/dL — AB (ref 13.0–17.0)
MCH: 29.2 pg (ref 26.0–34.0)
MCHC: 32.8 g/dL (ref 30.0–36.0)
MCV: 89.2 fL (ref 78.0–100.0)
Platelets: 185 10*3/uL (ref 150–400)
RBC: 4.24 MIL/uL (ref 4.22–5.81)
RDW: 13.9 % (ref 11.5–15.5)
WBC: 10.1 10*3/uL (ref 4.0–10.5)

## 2015-05-30 LAB — MAGNESIUM: Magnesium: 2.1 mg/dL (ref 1.7–2.4)

## 2015-05-30 LAB — BASIC METABOLIC PANEL
Anion gap: 9 (ref 5–15)
BUN: 26 mg/dL — AB (ref 6–20)
CHLORIDE: 108 mmol/L (ref 101–111)
CO2: 30 mmol/L (ref 22–32)
CREATININE: 0.59 mg/dL — AB (ref 0.61–1.24)
Calcium: 8.9 mg/dL (ref 8.9–10.3)
GFR calc Af Amer: >60 mL/min (ref >60)
GFR calc non Af Amer: >60 mL/min (ref >60)
Glucose, Bld: 141 mg/dL — ABNORMAL HIGH (ref 65–99)
POTASSIUM: 4.2 mmol/L (ref 3.5–5.1)
Sodium: 147 mmol/L — ABNORMAL HIGH (ref 135–145)

## 2015-05-30 LAB — AMMONIA: Ammonia: 39 umol/L — ABNORMAL HIGH (ref 9–35)

## 2015-05-30 LAB — FOLATE: FOLATE: 8.7 ng/mL (ref >5.9)

## 2015-05-30 LAB — VITAMIN B12: VITAMIN B 12: 385 pg/mL (ref 180–914)

## 2015-05-30 LAB — PHOSPHORUS: PHOSPHORUS: 4.4 mg/dL (ref 2.5–4.6)

## 2015-05-30 MED ORDER — VALPROATE SODIUM 250 MG/5ML PO SYRP
250.0000 mg | ORAL_SOLUTION | Freq: Three times a day (TID) | ORAL | Status: DC
  Administered 2015-05-30 – 2015-06-09 (×29): 250 mg via ORAL
  Filled 2015-05-30 (×35): qty 5

## 2015-05-30 MED ORDER — CYANOCOBALAMIN 1000 MCG/ML IJ SOLN
1000.0000 ug | INTRAMUSCULAR | Status: AC
  Administered 2015-05-30 – 2015-06-01 (×3): 1000 ug via SUBCUTANEOUS
  Filled 2015-05-30 (×3): qty 1

## 2015-05-30 NOTE — Progress Notes (Signed)
Nutrition Follow-up  DOCUMENTATION CODES:   Obesity unspecified  INTERVENTION:    Continue Vital  AF 1.2 at 80 ml/h (1920 ml/day) to provide 2304 kcals, 144 gm protein, 1557 ml free water daily  NUTRITION DIAGNOSIS:   Inadequate oral intake related to inability to eat as evidenced by NPO status.  Ongoing  GOAL:   Patient will meet greater than or equal to 90% of their needs  Met  MONITOR:   TF tolerance, Diet advancement, I & O's, Labs  REASON FOR ASSESSMENT:   Consult Enteral/tube feeding initiation and management  ASSESSMENT:   Pt with hx of GERD, recently at [facility] for major depressive DO. Pt was [facility] from [facility] to [place] [facility] where he refused to eat/drink or take meds. Pt noted to have seizure and brought to [facility] where he suffered another seizure and was intubated.   Cortrak in place (tip at 4th portion of duodenum/Ligament of Treitz). Patient is currently receiving Vital AF 1.2 at 80 ml/h (1920 ml/day) to provide 2304 kcals, 144 gm protein, 1557 ml free water daily.  Patient tolerating TF well per discussion with RN.  Weight has stabilized.  Diet Order:  Diet NPO time specified  Skin:  Wound (see comment) (Neck with rash and blister)  Last BM:  2/25  Height:   Ht Readings from Last 1 Encounters:  05/26/15 '5\' 11"'  (1.803 m)    Weight:   Wt Readings from Last 1 Encounters:  05/30/15 257 lb 15 oz (117 kg)   05/24/15 256 lb 13.4 oz (116.5 kg)       05/17/15 267 lb 13.7 oz (121.5 kg)        Ideal Body Weight:  78.1 kg  BMI:  Body mass index is 35.99 kg/(m^2).  Estimated Nutritional Needs:   Kcal:  9390-3009  Protein:  125-145 grams  Fluid:  >2.3 L/day  EDUCATION NEEDS:   No education needs identified at this time  Molli Barrows, [place], [place], [place] Pager [phone] After Hours Pager [phone]

## 2015-05-30 NOTE — Progress Notes (Signed)
Speech Language Pathology Treatment: Dysphagia  Patient Details Name: Chase GivensStewart R Hampton MRN: [id number] DOB: 07/05/1990 Today's Date: 05/30/2015 Time: [id number] SLP Time Calculation (min) (ACUTE ONLY): 20 min  Assessment / Plan / Recommendation Clinical Impression  Pt arousable to voice, nodded yes when asked if he would like som water. SLP provided appropriate positioning and hand over hand assist for pt to take cup and straw sips of water and bites of puree with no signs of aspiration. He was eager to eat and drink, repeatedly requesting more and allowing his parents to feed him as well. Pt did have increased RR with PO, but not appearing detrimental to swallow safety.  SLP provided instruction on basic precautions. Recommend pt initiate a full liquid diet though purees and pudding also allowed, when alert. Will f/u for solid trials tomorrow.    HPI HPI: Pt with hx of GERD, [facility] [place] [facility] where he refused to eat/drink or take meds. Pt noted to have seizure and [facility] another seizure and was intubated 2/19-2/28. Pt has remained lethargic since then. CT spine shows Edema posterior paraspinal musculature - minimal amount of retropharyngeal edema.       SLP Plan  Continue with current plan of care     Recommendations  Diet recommendations: Dysphagia 1 (puree);Thin liquid Liquids provided via: Cup;Straw Medication Administration: Via alternative means Supervision: Staff to assist with self feeding;Trained caregiver to feed patient;Full supervision/cueing for compensatory strategies Compensations: Slow rate;Small sips/bites Postural Changes and/or Swallow Maneuvers: Seated upright 90 degrees             General recommendations: Rehab consult Plan: Continue with current plan of care     GO               [facility] HospitalBonnie Javeah Loeza, MA CCC-SLP [phone]  Claudine MoutonDeBlois, Chase Hampton 05/30/2015, 4:32  PM

## 2015-05-30 NOTE — Progress Notes (Signed)
[place] TEAM 1 - Stepdown/ICU TEAM PROGRESS NOTE  [facility] ZOX:[id number] DOB: 1990/11/14 DOA: 05/15/2015 PCP: [name], MD  Admit HPI / Brief Narrative: 25 y/o Mw/ a Hx of Anxiety, Depression (prior [facility] hospitalizations) with recent admission to [facility] in [place] from 2/3 > 2/8 for major depressive disorder with homicidal / suicidal ideation after a break up with his fiancee. Due to restraining order violations, he was transferred from [facility] to [facility] jail were he had been on suicide watch. The patient reportedly refused to eat, drink or take his medications. He reportedly would only lay in the floor and urinate on himself. After multiple episodes of urinating on himself, he was asked to mop up the floor, which he was able to perform. Prior to discharge from [facility], notes reflect he had multiple episodes of physically aggressive behavior - including striking his own head on the seclusion room walls. At discharge he was to continue on Cymbalta, Depakote and Saphris. Prison staff report the patient refused all medications.   On 2/19, the patient was noted to have a seizure in jail. He was transported to [facility] ER via EMS. It is documented that he arrived to the ER with urine and feces all over him. There is question if patient was weaning off ativan?? Per [facility] notes but discharge note from [facility] does not include Rx for ativan. Room air saturations were 82%, he was treated with 100% NRB. He was treated with  IV versed per EMS en route. The patient had a second seizure in ER and was ultimately intubated. He was transferred to [facility] ICU for further evaluation.  The patient arrived to [facility] on propofol, mechanically intubated.   Significant Events: 2/19 Admit from jail with seizure, intubated  2/20 LP > negative for infection  2/20 EEG > No active seizures.  2/21 Repeat EEG > no seizure activity 2/21 TEE normal  2/22 focal seizures mouth 2/27 MRI brain wo contrast  normal 2/27 MRI T-spine/C-spine - Edema posterior paraspinal musculature - minimal amount of retropharyngeal edema - Nonspecific posterior paravertebral edema extends into the upper thoracic region - Multiple bulging disks with cord flattening   HPI/Subjective: The patient has improved dramatically today.  With help from S LP he has been able to feed himself some applesauce and take sips of water through a straw.  His eyes are much more open.  He does not appear to be in respiratory distress.  Assessment/Plan:  FUO (103.8 05/26/15) - Altered mental status - ?NMS -NMS?- no evidence of ongoing infection  -Patient currently on antibiotic for Staph hominis -Neuro following - bromocriptine and amantadine continue  -temp stable w/ cooling blanket w/ highest recorded temp since 3/2 being 101.2 -avoid serotonin active agents or antipsychotics   Staph hominis bacteremia 2/2 upon admission to [facility] ED 05/15/2015 - TEE negative and LP benign - felt to be a genuine bacteremia per ID - ID recommended a 2 week course of tx w/ IV abx (through May 29, 2015) - ID signed off 2/23 - now off abx   Acute Hypoxic Respiratory Failure - Resolved  -Titrate O2 for sat of 88-92%  Hypernatremia  -appears to be due to hypovolemia - improving w/ free water    Hypotension  -resolved  Rhabdomyolysis - resolved  - admit CK >7500 - resolved in f/u at 71  Bipolar D/O w/ Depression > Catatonia  -noncompliant w/ medications while incarcerated > withdrawal  Code Status: FULL Family Communication: Spoke with mother and father  and sister at bedside Disposition Plan: SDU  Consultants: Psychiatry PCCM Neurology  ID  Antibiotics: Rocephin 2/19 > 2/22 Vanco 2/20 > 2/22 Cefazolin 2/22 > 3/5  DVT prophylaxis: lovenox   Objective: Blood pressure 122/68, pulse 95, temperature 100.5 F (38.1 C), temperature source Oral, resp. rate 19, height  (1.803 m), weight 117 kg (257 lb 15 oz), SpO2 97  %.  Intake/Output Summary (Last 24 hours) at 05/30/15 1614 Last data filed at 05/30/15 1200  Gross per 24 hour  Intake   1470 ml  Output   1350 ml  Net    120 ml   Exam: General: No acute respiratory distress - more alert  Lungs: Clear to auscultation bilaterally - no wheeze  Cardiovascular: RRR - no gallup or rub  Abdomen: nondistended, soft, bowel sounds positive, no rebound, no ascites, no appreciable mass Extremities: no significant cyanosis, or clubbing - 1+ edema bilateral lower extremities w/o change  Data Reviewed:  Basic Metabolic Panel:  Recent Labs Lab 05/24/15 0319 05/25/15 0410 05/26/15 0430 05/27/15 0447 05/28/15 0532 05/29/15 0324 05/30/15 0822  NA 148* 148* 148* 148* 148* 149* 147*  K 3.9 3.7 3.7 4.1 4.0 4.3 4.2  CL 105 105 108 108 109 109 108  CO2 31 32 GLUCOSE 144* 125* 160* 140* 162* 135* 141*  BUN 40* 34* 30* 31* 30* 29* 26*  CREATININE 0.76 0.70 0.77 0.77 0.72 0.66 0.59*  CALCIUM 9.2 9.1 9.0 8.8* 9.0 8.9 8.9  MG 2.1 2.3 2.2 2.2  --   --  2.1  PHOS 3.9 3.5 3.3  --   --   --  4.4    CBC:  Recent Labs Lab 05/26/15 0430 05/27/15 0447 05/28/15 0532 05/29/15 0324 05/30/15 0822  WBC 9.6 11.4* 13.1* 11.4* 10.1  NEUTROABS  --  7.9*  --   --   --   HGB 13.0 13.3 13.5 13.2 12.4*  HCT 40.8 41.3 42.0 41.8 37.8*  MCV 89.3 88.4 89.7 90.1 89.2  PLT 232 246 239 232 185    Liver Function Tests:  Recent Labs Lab 05/27/15 0447 05/28/15 0532 05/29/15 0324  AST 66* 48* 35  ALT 95* 81* 64*  ALKPHOS 61 57 55  BILITOT 0.7 0.6 0.3  PROT 6.4* 5.7* 5.9*  ALBUMIN 2.5* 2.4* 2.5*    Recent Labs Lab 05/30/15 0822  AMMONIA 39*   Cardiac Enzymes:  Recent Labs Lab 05/27/15 0447 05/27/15 0956 05/27/15 1538 05/28/15 0532 05/29/15 0324  CKTOTAL 80 71 68 87 64    CBG:  Recent Labs Lab 05/29/15 1945 05/29/15 2343 05/30/15 0604 05/30/15 0718 05/30/15 1102  GLUCAP 108* 135* 128* 114* 122*    Recent Results (from the past  240 hour(s))  Culture, blood (routine x 2)     Status: None (Preliminary result)   Collection Time: 05/26/15 10:34 PM  Result Value Ref Range Status   Specimen Description BLOOD LEFT HAND  Final   Special Requests BOTTLES DRAWN AEROBIC ONLY 5CC  Final   Culture NO GROWTH 3 DAYS  Final   Report Status PENDING  Incomplete  Culture, blood (routine x 2)     Status: None (Preliminary result)   Collection Time: 05/26/15 10:44 PM  Result Value Ref Range Status   Specimen Description BLOOD RIGHT HAND  Final   Special Requests BOTTLES DRAWN AEROBIC AND ANAEROBIC 5CC   Final   Culture NO GROWTH 3 DAYS  Final   Report Status PENDING  Incomplete  Culture, Urine     Status: None   Collection Time: 05/27/15  4:45 AM  Result Value Ref Range Status   Specimen Description URINE, CATHETERIZED  Final   Special Requests NONE  Final   Culture NO GROWTH 1 DAY  Final   Report Status 05/28/2015 FINAL  Final     Studies:   Recent x-ray studies have been reviewed in detail by the Attending Physician  Scheduled Meds:  Scheduled Meds: . amantadine  100 mg Per Tube TID  . antiseptic oral rinse  7 mL Mouth Rinse QID  . baclofen  10 mg Oral TID  . bromocriptine  2.5 mg Per Tube QID  . chlorhexidine gluconate  15 mL Mouth Rinse BID  . clonazePAM  0.25 mg Per Tube QID  . divalproex  250 mg Oral 3 times per day  . enoxaparin (LOVENOX) injection  40 mg Subcutaneous Q24H  . free water  300 mL Per Tube 4 times per day  . neomycin-bacitracin-polymyxin   Topical BID    Time spent on care of this patient: 25 mins   Baystate Franklin Medical CenterMCCLUNG,Erhard Senske T , MD   [facility] Office  [phone] Pager - Text Page per Loretha StaplerAmion as per below:  On-Call/Text Page:      [email or web address]      password [email or web address]  If 7PM-7AM, please contact night-coverage [email or web address] Password [email or web address] 05/30/2015, 4:14 PM   LOS: 15 days

## 2015-05-30 NOTE — Care Management Note (Signed)
Case Management Note  Patient Details  Name: Chase GivensStewart R Hampton MRN: [id number] Date of Birth: 01/30/1991  Subjective/Objective:   Patient is out of corrections facility on bail, per pt eval rec snf,CSW following. Patient conts to be catotonic, fever 103.                 Action/Plan:   Expected Discharge Date:                  Expected Discharge Plan:  Skilled Nursing Facility  In-House Referral:  Clinical Social Work  Discharge planning Services  CM Consult  Post Acute Care Choice:    Choice offered to:     DME Arranged:    DME Agency:     HH Arranged:    HH Agency:     Status of Service:  In process, will continue to follow  Medicare Important Message Given:    Date Medicare IM Given:    Medicare IM give by:    Date Additional Medicare IM Given:    Additional Medicare Important Message give by:     If discussed at Long Length of Stay Meetings, dates discussed:    Additional Comments:  Leone Havenaylor, Cortina Vultaggio Clinton, RN 05/30/2015, 5:30 PM

## 2015-05-30 NOTE — Progress Notes (Signed)
Subjective: Patient has been febrile and is currently on a cooling blanket. No adverse clinical events reported otherwise.  Objective: Current vital signs: BP 116/61 mmHg  Pulse 96  Temp(Src) 100.1 F (37.8 C) (Rectal)  Resp 19  Ht 5\' 11"  (1.803 m)  Wt 117 kg (257 lb 15 oz)  BMI 35.99 kg/m2  SpO2 98%  Neurologic Exam: Patient was sleeping but was easily aroused. He remained awake and interactive, including following commands appropriately. He was able to visually track without difficulty. Pupils were equal and reacted normally to light. Extraocular movements were full and conjugate. Face was symmetrical with no signs of weakness. Muscle tone was markedly increased throughout. Patient appeared to be comfortable, however.  Medications: I have reviewed the patient's current medications.  Assessment/Plan: 25 year old man with encephalopathic state and persistent diffuse muscle rigidity, thought to be related to use of neuroleptic agents. Patient is shown a slight improvement in muscle tone with bromocriptine and baclofen. He appears to be tolerating these medications well.  Recommend no changes in current management with continuing bromocriptine and baclofen at current doses, as well as continuing physical and occupational therapy interventions.  We will continue to follow this patient with you.  C.R. [name], MD [facility] [phone]  05/30/2015  10:22 AM

## 2015-05-31 DIAGNOSIS — R29898 Other symptoms and signs involving the musculoskeletal system: Secondary | ICD-10-CM

## 2015-05-31 LAB — BASIC METABOLIC PANEL
Anion gap: 9 (ref 5–15)
BUN: 25 mg/dL — AB (ref 6–20)
CHLORIDE: 105 mmol/L (ref 101–111)
CO2: 27 mmol/L (ref 22–32)
CREATININE: 0.55 mg/dL — AB (ref 0.61–1.24)
Calcium: 8.8 mg/dL — ABNORMAL LOW (ref 8.9–10.3)
GFR calc Af Amer: >60 mL/min (ref >60)
GFR calc non Af Amer: >60 mL/min (ref >60)
GLUCOSE: 118 mg/dL — AB (ref 65–99)
POTASSIUM: 4.1 mmol/L (ref 3.5–5.1)
SODIUM: 141 mmol/L (ref 135–145)

## 2015-05-31 LAB — GLUCOSE, CAPILLARY
GLUCOSE-CAPILLARY: 112 mg/dL — AB (ref 65–99)
GLUCOSE-CAPILLARY: 121 mg/dL — AB (ref 65–99)
GLUCOSE-CAPILLARY: 114 mg/dL — AB (ref 65–99)
GLUCOSE-CAPILLARY: 125 mg/dL — AB (ref 65–99)
Glucose-Capillary: 109 mg/dL — ABNORMAL HIGH (ref 65–99)
Glucose-Capillary: 100 mg/dL — ABNORMAL HIGH (ref 65–99)

## 2015-05-31 NOTE — Progress Notes (Signed)
PT Cancellation Note  Patient Details Name: Chase GivensStewart R XXXDillard MRN: [id number] DOB: 01/01/1991   Cancelled Treatment:    Reason Eval/Treat Not Completed: Medical issues which prohibited therapy. Per RN pt w/ RR in the upper 20's and HR in the 120's at rest.  Will hold PT for now but will continue following acutely.  Encarnacion ChuAshley Trashawn Oquendo PT, DPT  Pager: [phone] Phone: [phone] 05/31/2015, 11:14 AM

## 2015-05-31 NOTE — Progress Notes (Signed)
[place] TEAM 1 - Stepdown/ICU TEAM Progress Note  Chase GivensStewart R XXXDillard ZOX:[id number] DOB: 11/04/1990 DOA: 05/15/2015 PCP: Fidel LevyJames Hawkins Jr, MD  Admit HPI / Brief Narrative: 25 y/o WM  PMHx Anxiety, Depression ([facility]) [facility] [place] [facility]-2/8 for major depressive disorder with homicidal / suicidal ideations   After a recent break up with his fiancee. Due to restraining order violations, [facility] were he has been on suicide watch since arrival. The patient reportedly refused to eat, drink or take medications. He was reportedly seen lying in the floor and would urinate on himself. After multiple episodes of urinating on himself, he was asked to mop up the floor which he was able to perform. [facility] notes reflect he had multiple episodes of physically aggressive behavior - including striking his own head on the seclusion room walls. At discharge he was to continue on Cymbalta, Depakote and Saphris. Prison staff report the patient refused all medications after arrival.   On 2/19, the patient was noted to have a seizure in jail. He was transported to [facility] via EMS. It is documented that he arrived to the ER with urine and feces all over him. There is question if patient was weaning off ativan?? Per [facility]. Room air saturations were 82%, he was treated with 100% NRB. He was treated with 4mg  IV versed per EMS in route. The patient had a second seizure in ER and was ultimately intubated. [facility]   The patient arrived on propofol, mechanically intubated.   HPI/Subjective: 3/7,  A/O 4, moves and talks very slowly, follows all commands. MAXIMUM TEMPERATURE overnight 38.4C  Assessment/Plan: Acute Hypoxic Respiratory Failure  -Resolved  -Titrate O2 for sat of 88-92%. -Cortrack  in place.  Hypotension  - resolved -TEE; normal see results below  Rhabdomyolysis  - admit CK >7500 -Resolved   Hypernatremia -Resolved  Hypotension -Resolved  Altered mental status/Neuroleptic Malignant Syndrome?/ -Patient was on antipsychotic Asenapine last dose 2/26.  Patient now has spiking fevers, catatonia, rigidity which would be consistent with NMS. -Discussed case with Dr. [name] Neuro Hospitalist, and he agreed possible therefore they will see patient in A.m. -Continue amantadine 100 mg TID per neurology -Continue bromocriptine 2.5 mg QID -Patient continues to improve  Depression/Bipolar DO/Catatonia -Continue Cymbalta 30 mg daily  Staph Hominis -Completed course of antibiotics  Spiking fevers -NMS?  Vs infectious process? vs medication withdrawal,? -Continues to spike fevers however improving    Code Status: FULL Family Communication: Whole family present at time of exam Disposition Plan: ??? Per psych    Consultants: Dr.[name] psychiatry Dr.[name] neurology     Procedure/Significant Events: 2/19 Admit from jail with seizure, intubated  2/20 LP >> negative final  2/20 EEG  >> No active seizures.  Repeat EEG>>>no seizure activity 2/21 TEE; normal echocardiogram 2/22- focal seizures mouth but LOS associated 2/27 MRI brain wo contrast; normal  2/27 MRI T-spine/C-spine;  -Edema posterior paraspinal musculature  -minimal amount of retropharyngeal edema. -Nonspecific posterior paravertebral edema extends into the upper thoracic region -Multiple bulging disks with cord flattening     Culture 2/19 blood left  AC/wrist positive staph Hominis 2/19 was by PCR negative 2/19 blood right AC 2 negative final 2/19 HIV negative 2/21 LP  >>> negative final (VDRL, cryptococcal antigen, HSV  1/2) 2/22 blood right/left hand negative final 3/2 blood pending 3/2 urine pending   Antibiotics: Rocephin 2gm  2/19 >> 2/22 Vanco 2/20 >> 2/22 Cefazolin 2/22 >> 3/5   DVT prophylaxis: SCD   Devices NA   LINES / TUBES:  NA    Continuous Infusions: . dextrose 5 % and 0.45% NaCl 10 mL/hr at 05/30/15 0600  . feeding supplement (VITAL AF 1.2 CAL) 1,000 mL (05/31/15 0651)    Objective: VITAL SIGNS: Temp: 99.7 F (37.6 C) (03/07 1906) Temp Source: Rectal (03/07 1906) Pulse Rate: 124 (03/07 1010) SPO2; FIO2:   Intake/Output Summary (Last 24 hours) at 05/31/15 2056 Last data filed at 05/31/15 1300  Gross per 24 hour  Intake   1100 ml  Output    700 ml  Net    400 ml     Exam: General: A/O 4, moves and talks very slowly, follows all commands, No acute respiratory distress Eyes: negative scleral hemorrhage ENT: Negative Runny nose, negative gingival bleeding, Neck:  Negative scars, masses, torticollis, lymphadenopathy, JVD Lungs: Clear to auscultation bilaterally without wheezes or crackles Cardiovascular: Tachycardic, Regular rhythm without murmur gallop or rub normal S1 and S2 Abdomen:negative abdominal pain, nondistended, positive soft, bowel sounds, no rebound, no ascites, no appreciable mass Extremities: No significant cyanosis, clubbing, or edema bilateral lower extremities Psychiatric:  Negative depression, negative anxiety, negative fatigue, negative mania Neurologic:   Cranial nerves II through XII intact, tongue/uvula midline, bilateral upper extremity strength 4/5, patient able to flex and extend fingers on command .RLE strength 4/5, LLE strength 3/5,sensation intact throughout, negative dysarthria, negative expressive aphasia, negative receptive aphasia.   Data Reviewed: Basic Metabolic Panel:  Recent Labs Lab 05/25/15 0410 05/26/15 0430 05/27/15 0447 05/28/15 0532 05/29/15 0324 05/30/15 0822 05/31/15 0510  NA 148* 148* 148* 148* 149* 147* 141  K 3.7 3.7 4.1 4.0 4.3 4.2 4.1  CL 105 108 108 109 109 108 105  CO2 32 GLUCOSE 125* 160* 140*  162* 135* 141* 118*  BUN 34* 30* 31* 30* 29* 26* 25*  CREATININE 0.70 0.77 0.77 0.72 0.66 0.59* 0.55*  CALCIUM 9.1 9.0 8.8* 9.0 8.9 8.9 8.8*  MG 2.3 2.2 2.2  --   --  2.1  --   PHOS 3.5 3.3  --   --   --  4.4  --    Liver Function Tests:  Recent Labs Lab 05/27/15 0447 05/28/15 0532 05/29/15 0324  AST 66* 48* 35  ALT 95* 81* 64*  ALKPHOS 61 57 55  BILITOT 0.7 0.6 0.3  PROT 6.4* 5.7* 5.9*  ALBUMIN 2.5* 2.4* 2.5*   No results for input(s): LIPASE, AMYLASE in the last 168 hours.  Recent Labs Lab 05/30/15 0822  AMMONIA 39*   CBC:  Recent Labs Lab 05/26/15 0430 05/27/15 0447 05/28/15 0532 05/29/15 0324 05/30/15 0822  WBC 9.6 11.4* 13.1* 11.4* 10.1  NEUTROABS  --  7.9*  --   --   --   HGB 13.0 13.3 13.5 13.2 12.4*  HCT 40.8 41.3 42.0 41.8 37.8*  MCV 89.3 88.4 89.7 90.1 89.2  PLT 232 246 239 232 185   Cardiac Enzymes:  Recent Labs Lab 05/27/15 0447 05/27/15 0956 05/27/15 1538 05/28/15 0532 05/29/15 0324  CKTOTAL 80 71 68 87 64   BNP (last 3 results) No results for input(s): BNP in the last 8760 hours.  ProBNP (last 3 results) No results for input(s): PROBNP in the last 8760 hours.  CBG:  Recent Labs Lab 05/31/15 0728 05/31/15 1215 05/31/15 1515 05/31/15 1649 05/31/15 1908  GLUCAP 121* 114* 125* 109* 100*    Recent Results (from the past 240 hour(s))  Culture, blood (routine x 2)     Status: None (Preliminary result)   Collection Time: 05/26/15 10:34 PM  Result Value Ref Range Status   Specimen Description BLOOD LEFT HAND  Final   Special Requests BOTTLES DRAWN AEROBIC ONLY 5CC  Final   Culture NO GROWTH 4 DAYS  Final   Report Status PENDING  Incomplete  Culture, blood (routine x 2)     Status: None (Preliminary result)   Collection Time: 05/26/15 10:44 PM  Result Value Ref Range Status   Specimen Description BLOOD RIGHT HAND  Final   Special Requests BOTTLES DRAWN AEROBIC AND ANAEROBIC 5CC   Final   Culture NO GROWTH 4 DAYS  Final    Report Status PENDING  Incomplete  Culture, Urine     Status: None   Collection Time: 05/27/15  4:45 AM  Result Value Ref Range Status   Specimen Description URINE, CATHETERIZED  Final   Special Requests NONE  Final   Culture NO GROWTH 1 DAY  Final   Report Status 05/28/2015 FINAL  Final     Studies:  Recent x-ray studies have been reviewed in detail by the Attending Physician  Scheduled Meds:  Scheduled Meds: . amantadine  100 mg Per Tube TID  . antiseptic oral rinse  7 mL Mouth Rinse QID  . baclofen  10 mg Oral TID  . bromocriptine  2.5 mg Per Tube QID  . chlorhexidine gluconate  15 mL Mouth Rinse BID  . clonazePAM  0.25 mg Per Tube QID  . cyanocobalamin  1,000 mcg Subcutaneous Q24H  . enoxaparin (LOVENOX) injection  40 mg Subcutaneous Q24H  . free water  300 mL Per Tube 4 times per day  . neomycin-bacitracin-polymyxin   Topical BID  . valproic acid  250 mg Oral 3 times per day    Time spent on care of this patient: 40 mins   Phil Corti, Roselind Messier , MD  [facility] Office  [phone] Pager - [phone]  On-Call/Text Page:      [email or web address]      password [email or web address]  If 7PM-7AM, please contact night-coverage [email or web address] Password [email or web address] 05/31/2015, 8:56 PM   LOS: 16 days   Care during the described time interval was provided by me .  I have reviewed this patient's available data, including medical history, events of note, physical examination, and all test results as part of my evaluation. I have personally reviewed and interpreted all radiology studies.   Carolyne Littles, MD [phone] Pager

## 2015-05-31 NOTE — Progress Notes (Signed)
Subjective: On cooling blanket and afebrile.   Exam: Filed Vitals:   05/31/15 0355 05/31/15 0400  BP:  109/56  Pulse:  106  Temp: 98.9 F (37.2 C)   Resp:  18      Gen: In bed, NAD MS: opens eyes to voice and bale to follow simple commands--stick tongue out, show both thumbs, lift arms to commands.  Pupils were equal and reacted normally to light. Extraocular movements were full and conjugate. Face was symmetrical with no signs of weakness. Muscle tone was markedly increased throughout. Patient remains comfortable  Pertinent Labs: none  Felicie MornDavid Smith PA-C [facility] [phone]  Impression: 25 year old man with encephalopathic state and persistent diffuse muscle rigidity, thought to be related to use of neuroleptic agents. Patient is shown a slight improvement in muscle tone with bromocriptine and baclofen. He continues to be tolerating these medications well.  Recommend no changes in current management with continuing bromocriptine and baclofen at current doses, as well as continuing physical and occupational therapy interventions.  Will follow on a when necessary basis after this visit.  I personally participated in this patient's evaluation and management, including formulating the above clinical impression and management recommendations.  Venetia MaxonR Dugan M.D. [facility] [phone].   05/31/2015, 9:29 AM

## 2015-05-31 NOTE — Progress Notes (Signed)
Speech Language Pathology Treatment: Dysphagia  Patient Details Name: [facility] MRN: [id number] DOB: 04/09/1990 Today's Date: 05/31/2015 Time: [id number] SLP Time Calculation (min) (ACUTE ONLY): 23 min  Assessment / Plan / Recommendation Clinical Impression  Pt demonstrates stable swallow function. SLP provided hand over hand assist for intake of 4 oz tea, 4 oz of milk and part of a pudding cup and a yogurt cup without any signs of aspiration. Pts respiratory effort fluctuated during meal, sometimes with hard heavy breathing. Pt did have appropriate apneic period for airway protection, even with consecutive sips. Will continue efforts.    HPI HPI: Pt with hx of GERD, recently at [facility] for major depressive DO. [facility] was [facility] where he refused to eat/drink or take meds. Pt noted to have seizure and brought to [facility] where he suffered another seizure and was intubated 2/19-2/28. Pt has remained lethargic since then. CT spine shows Edema posterior paraspinal musculature - minimal amount of retropharyngeal edema.       SLP Plan  Continue with current plan of care     Recommendations  Diet recommendations: Dysphagia 1 (puree);Thin liquid Liquids provided via: Cup;Straw Medication Administration: Via alternative means Supervision: Staff to assist with self feeding;Trained caregiver to feed patient;Full supervision/cueing for compensatory strategies Compensations: Slow rate;Small sips/bites Postural Changes and/or Swallow Maneuvers: Seated upright 90 degrees             General recommendations: Rehab consult Oral Care Recommendations: Oral care BID Follow up Recommendations: Inpatient Rehab Plan: Continue with current plan of care     GO               [facility] Surgery CenterBonnie Fiore Detjen, MA CCC-SLP [phone]  [facility], [facility] 05/31/2015, 4:03 PM

## 2015-06-01 LAB — CULTURE, BLOOD (ROUTINE X 2)
CULTURE: NO GROWTH
Culture: NO GROWTH

## 2015-06-01 LAB — GLUCOSE, CAPILLARY
GLUCOSE-CAPILLARY: 117 mg/dL — AB (ref 65–99)
GLUCOSE-CAPILLARY: 118 mg/dL — AB (ref 65–99)
GLUCOSE-CAPILLARY: 92 mg/dL (ref 65–99)
Glucose-Capillary: 99 mg/dL (ref 65–99)
Glucose-Capillary: 110 mg/dL — ABNORMAL HIGH (ref 65–99)
Glucose-Capillary: 105 mg/dL — ABNORMAL HIGH (ref 65–99)

## 2015-06-01 MED ORDER — ONDANSETRON HCL 4 MG/2ML IJ SOLN
INTRAMUSCULAR | Status: AC
  Filled 2015-06-01: qty 2

## 2015-06-01 MED ORDER — ONDANSETRON HCL 4 MG/2ML IJ SOLN
4.0000 mg | Freq: Four times a day (QID) | INTRAMUSCULAR | Status: DC | PRN
  Administered 2015-06-01: 4 mg via INTRAVENOUS

## 2015-06-01 NOTE — Care Management Note (Addendum)
Case Management Note  Patient Details  Name: Chase GivensStewart R XXXDillard MRN: [id number] Date of Birth: 03/28/1990  Subjective/Objective:   Patient is slowly improving, was able to work with physical therapy today,has not had to use cooling blanket today, if does not need tonight will be able to transfer to floor 3/9 per MD. CSW aware snf rec by pt.    NCM spoke with mother Buren KosLynn Parks , she states patient [facility] and [facility] because he was unresponsive.  Patient was in jail due to violation of 50 B (temporary restraining order) from ex fiance.  Psych is following patient and is rec inpt psych at the time of dc.  Mom states she does not want him to go back to [facility] mental inpt because he has been there 3 times and he is not getting what he needs there. She states she is interested in [place] and  A place in [place] that they were interested in and [facility] [place] and she states she would also like for him to go to the [facility] [place] where they do brain scans to figure out what is causing different personality d/o's. NCM informed mother that this information will be relayed to the psych CSW.               Action/Plan:   Expected Discharge Date:                  Expected Discharge Plan:  Skilled Nursing Facility  In-House Referral:  Clinical Social Work  Discharge planning Services  CM Consult  Post Acute Care Choice:    Choice offered to:     DME Arranged:    DME Agency:     HH Arranged:    HH Agency:     Status of Service:  Completed, signed off  Medicare Important Message Given:    Date Medicare IM Given:    Medicare IM give by:    Date Additional Medicare IM Given:    Additional Medicare Important Message give by:     If discussed at Long Length of Stay Meetings, dates discussed:    Additional Comments:  Leone Havenaylor, Kelyn Koskela Clinton, RN 06/01/2015, 7:26 PM

## 2015-06-01 NOTE — Progress Notes (Signed)
[employer] TEAM 1 - Stepdown/ICU TEAM PROGRESS NOTE  Chase MCCLANAHAN NWG:[id number] DOB: Apr 02, 1990 DOA: 05/15/2015 PCP: [name], MD  Admit HPI / Brief Narrative: 25 y/o Mw/ a Hx of Anxiety, Depression (prior [facility] hospitalizations) with recent admission to [facility] in [place] from 2/3 > 2/8 for major depressive disorder with homicidal / suicidal ideation after a break up with his fiancee. Due to restraining order violations, he was transferred from [facility] to [facility] jail were he had been on suicide watch. The patient reportedly refused to eat, drink or take his medications. He reportedly would only lay in the floor and urinate on himself. After multiple episodes of urinating on himself, he was asked to mop up the floor, which he was able to perform. Prior to discharge from [facility], notes reflect he had multiple episodes of physically aggressive behavior - including striking his own head on the seclusion room walls. At discharge he was to continue on Cymbalta, Depakote and Saphris. Prison staff report the patient refused all medications.   On 2/19, the patient was noted to have a seizure in jail. He was transported to [facility] ER via EMS. It is documented that he arrived to the ER with urine and feces all over him. There is question if patient was weaning off ativan?? Per [facility] notes but discharge note from [facility] does not include Rx for ativan. Room air saturations were 82%, he was treated with 100% NRB. He was treated with  IV versed per EMS en route. The patient had a second seizure in ER and was ultimately intubated. He was transferred to [facility] ICU for further evaluation.  The patient arrived to [facility] on propofol, mechanically intubated.   Significant Events: 2/19 Admit from jail with seizure, intubated  2/20 LP > negative for infection  2/20 EEG > No active seizures.  2/21 Repeat EEG > no seizure activity 2/21 TEE normal  2/22 focal seizures mouth 2/27 MRI brain wo contrast  normal 2/27 MRI T-spine/C-spine - Edema posterior paraspinal musculature - minimal amount of retropharyngeal edema - Nonspecific posterior paravertebral edema extends into the upper thoracic region - Multiple bulging disks with cord flattening   HPI/Subjective: The pt continues to make slow but consistent progress.  He was able to work w/ PT today, and has been able to consume small amounts of his meals.  He has not required the cooling blanket today.    Assessment/Plan:  FUO (103.8 05/26/15) - Altered mental status - ?NMS -NMS?- no evidence of ongoing infection  -Neuro following - bromocriptine and amantadine continue  -temp appears to be stabilizing off the cooling blanket  -avoid serotonin active agents or antipsychotics   Staph hominis bacteremia 2/2 + blood cultures upon admission to [facility] ED 05/15/2015 - TEE negative and LP benign - felt to be a genuine bacteremia per ID - ID recommended a 2 week course of tx w/ IV abx (through May 29, 2015) - ID signed off 2/23 - now off abx   Acute Hypoxic Respiratory Failure - Resolved  -now on RA w/ sats in upper 90s  Hypernatremia  -due to hypovolemia - improving w/ free water to normal Na   Hypotension  -resolved  Rhabdomyolysis - resolved  - admit CK >7500 - resolved in f/u at 71  Bipolar D/O w/ Depression > Catatonia  -care as per Psych recommendation   Code Status: FULL Family Communication: Spoke with father at bedside Disposition Plan: SDU - if temp remains stable over night w/o cooling blanket  could likely transfer to med bed in AM 3/9  Consultants: Psychiatry PCCM Neurology  ID  Antibiotics: Rocephin 2/19 > 2/22 Vanco 2/20 > 2/22 Cefazolin 2/22 > 3/5  DVT prophylaxis: lovenox   Objective: Blood pressure 106/63, pulse 108, temperature 98.6 F (37 C), temperature source Axillary, resp. rate 24, height 5\' 11"  (1.803 m), weight 119 kg (262 lb 5.6 oz), SpO2 98 %.  Intake/Output Summary (Last 24 hours) at 06/01/15  1455 Last data filed at 06/01/15 1151  Gross per 24 hour  Intake   3390 ml  Output   1250 ml  Net   2140 ml   Exam: General: No acute respiratory distress Lungs: Clear to auscultation bilaterally Cardiovascular: RRR - no M Abdomen: nondistended, soft, bowel sounds positive, no rebound, no ascites Extremities: no significant cyanosis, or clubbing - 1+ edema bilateral lower extremities  Data Reviewed:  Basic Metabolic Panel:  Recent Labs Lab 05/26/15 0430 05/27/15 0447 05/28/15 0532 05/29/15 0324 05/30/15 0822 05/31/15 0510  NA 148* 148* 148* 149* 147* 141  K 3.7 4.1 4.0 4.3 4.2 4.1  CL 108 108 109 109 108 105  CO2 31 31 30 30 30 27   GLUCOSE 160* 140* 162* 135* 141* 118*  BUN 30* 31* 30* 29* 26* 25*  CREATININE 0.77 0.77 0.72 0.66 0.59* 0.55*  CALCIUM 9.0 8.8* 9.0 8.9 8.9 8.8*  MG 2.2 2.2  --   --  2.1  --   PHOS 3.3  --   --   --  4.4  --     CBC:  Recent Labs Lab 05/26/15 0430 05/27/15 0447 05/28/15 0532 05/29/15 0324 05/30/15 0822  WBC 9.6 11.4* 13.1* 11.4* 10.1  NEUTROABS  --  7.9*  --   --   --   HGB 13.0 13.3 13.5 13.2 12.4*  HCT 40.8 41.3 42.0 41.8 37.8*  MCV 89.3 88.4 89.7 90.1 89.2  PLT 232 246 239 232 185    Liver Function Tests:  Recent Labs Lab 05/27/15 0447 05/28/15 0532 05/29/15 0324  AST 66* 48* 35  ALT 95* 81* 64*  ALKPHOS 61 57 55  BILITOT 0.7 0.6 0.3  PROT 6.4* 5.7* 5.9*  ALBUMIN 2.5* 2.4* 2.5*    Recent Labs Lab 05/30/15 0822  AMMONIA 39*   Cardiac Enzymes:  Recent Labs Lab 05/27/15 0447 05/27/15 0956 05/27/15 1538 05/28/15 0532 05/29/15 0324  CKTOTAL 80 71 68 87 64    CBG:  Recent Labs Lab 05/31/15 1908 05/31/15 2343 06/01/15 0437 06/01/15 0717 06/01/15 1135  GLUCAP 100* 117* 99 110* 92    Recent Results (from the past 240 hour(s))  Culture, blood (routine x 2)     Status: None (Preliminary result)   Collection Time: 05/26/15 10:34 PM  Result Value Ref Range Status   Specimen Description BLOOD  LEFT HAND  Final   Special Requests BOTTLES DRAWN AEROBIC ONLY 5CC  Final   Culture NO GROWTH 4 DAYS  Final   Report Status PENDING  Incomplete  Culture, blood (routine x 2)     Status: None (Preliminary result)   Collection Time: 05/26/15 10:44 PM  Result Value Ref Range Status   Specimen Description BLOOD RIGHT HAND  Final   Special Requests BOTTLES DRAWN AEROBIC AND ANAEROBIC 5CC   Final   Culture NO GROWTH 4 DAYS  Final   Report Status PENDING  Incomplete  Culture, Urine     Status: None   Collection Time: 05/27/15  4:45 AM  Result Value Ref Range  Status   Specimen Description URINE, CATHETERIZED  Final   Special Requests NONE  Final   Culture NO GROWTH 1 DAY  Final   Report Status 05/28/2015 FINAL  Final     Studies:   Recent x-ray studies have been reviewed in detail by the Attending Physician  Scheduled Meds:  Scheduled Meds: . amantadine  100 mg Per Tube TID  . antiseptic oral rinse  7 mL Mouth Rinse QID  . baclofen  10 mg Oral TID  . bromocriptine  2.5 mg Per Tube QID  . chlorhexidine gluconate  15 mL Mouth Rinse BID  . clonazePAM  0.25 mg Per Tube QID  . cyanocobalamin  1,000 mcg Subcutaneous Q24H  . enoxaparin (LOVENOX) injection  40 mg Subcutaneous Q24H  . free water  300 mL Per Tube 4 times per day  . neomycin-bacitracin-polymyxin   Topical BID  . valproic acid  250 mg Oral 3 times per day    Time spent on care of this patient: 25 mins   [facility] T , MD   [facility] Office  [phone] Pager - Text Page per Loretha Stapler as per below:  On-Call/Text Page:      [email or web address]      password [email or web address]  If 7PM-7AM, please contact night-coverage [email or web address] Password [email or web address] 06/01/2015, 2:55 PM   LOS: 17 days

## 2015-06-01 NOTE — Progress Notes (Signed)
Physical Therapy Treatment Patient Details Name: Chase Hampton R XXXDillard MRN: [id number] DOB: 09/19/1990 Today's Date: 06/01/2015    History of Present Illness Pt is a 25 y/o M admitted from jail where he had multiple episodes of urinating on himself and physically aggressive behavior, including striking his own head on the wall.  Pt had another seizure in the ER and was ultimately intubated.  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  His PMH includes anxiety and depression.    PT Comments    [name] was more alert today and appropriately answering questions, verbalizing ocassionally.  Pt requires max +2 assist for bed mobility and assist to complete therapeutic exercises sitting EOB.    Follow Up Recommendations  SNF;Supervision/Assistance - 24 hour (pt to return to prison?)     Equipment Recommendations  Wheelchair (measurements PT);Wheelchair cushion (measurements PT)    Recommendations for Other Services OT consult     Precautions / Restrictions Precautions Precautions: Fall Precaution Comments: seizure precautions; watch HR and RR Restrictions Weight Bearing Restrictions: No    Mobility  Bed Mobility Overal bed mobility: Needs Assistance;+2 for physical assistance;+ 2 for safety/equipment Bed Mobility: Supine to Sit;Sit to Supine     Supine to sit: +2 for physical assistance;+2 for safety/equipment;HOB elevated;Max assist Sit to supine: +2 for physical assistance;+2 for safety/equipment;Max assist   General bed mobility comments: Assist needed for all aspects of bed mobility w/ use of bed pad for positioning.  Pt does initiate movement.  Transfers                 General transfer comment: unable to attempt  Ambulation/Gait                 Stairs            Wheelchair Mobility    Modified Rankin (Stroke Patients Only)       Balance Overall balance assessment: Needs assistance Sitting-balance support:  Bilateral upper extremity supported;Feet supported Sitting balance-Leahy Scale: Poor Sitting balance - Comments: Able to sit EOB w/ close min guard for ~30 seconds before requiring support to trunk posteriorly.   Postural control: Posterior lean                          Cognition Arousal/Alertness: Awake/alert Behavior During Therapy: Flat affect Overall Cognitive Status: Difficult to assess Area of Impairment: Attention;Memory;Following commands;Safety/judgement;Problem solving   Current Attention Level: Selective   Following Commands: Follows one step commands with increased time     Problem Solving: Difficulty sequencing General Comments: Pt w/ increased RR into high 20's w/ increased effort to participate in therapy.  Pt follows all command but w/ increased time and assist as needed.  Pt more alert today responding w/ "purple" when asked the color of therpists scrubs and answering "yes" and "no" occassionally and appropriately.  Following simple commands to raise his arms or complete LE therapeutic exercises listed below.    Exercises General Exercises - Lower Extremity Ankle Circles/Pumps: PROM;Both;10 reps;Seated Quad Sets: Strengthening;Both;5 reps;Supine;AROM Long Arc Quad: PROM;Both;10 reps;Seated;AROM (limited to ~(-) 30 degrees knee extension on Lt) Straight Leg Raises: PROM;Both;5 reps;Supine Other Exercises Other Exercises: Reaching anteriorly while sitting EOB to engage abdominals.      General Comments General comments (skin integrity, edema, etc.): sitting EOB HR up to 129, all other VSS      Pertinent Vitals/Pain Pain Assessment: No/denies pain Faces Pain Scale: No hurt Pain Intervention(s): Limited activity within  patient's tolerance;Monitored during session    Home Living                      Prior Function            PT Goals (current goals can now be found in the care plan section) Acute Rehab PT Goals Patient Stated Goal: to sit  up PT Goal Formulation: With family Time For Goal Achievement: 06/17/15 Potential to Achieve Goals: Fair Progress towards PT goals: Progressing toward goals (very modestly)    Frequency  Min 2X/week    PT Plan Current plan remains appropriate    Co-evaluation             End of Session Equipment Utilized During Treatment: Oxygen Activity Tolerance: Patient limited by fatigue Patient left: in bed;with call bell/phone within reach;with SCD's reapplied;with family/visitor present     Time: 1610-9604 PT Time Calculation (min) (ACUTE ONLY): 27 min  Charges:  $Therapeutic Exercise: 8-22 mins $Therapeutic Activity: 8-22 mins                    G Codes:      Encarnacion Chu PT, DPT  Pager: [phone] Phone: [phone] 06/01/2015, 11:42 AM

## 2015-06-02 LAB — CBC
HCT: 36.2 % — ABNORMAL LOW (ref 39.0–52.0)
Hemoglobin: 12.3 g/dL — ABNORMAL LOW (ref 13.0–17.0)
MCH: 29.6 pg (ref 26.0–34.0)
MCHC: 34 g/dL (ref 30.0–36.0)
MCV: 87 fL (ref 78.0–100.0)
PLATELETS: 217 10*3/uL (ref 150–400)
RBC: 4.16 MIL/uL — ABNORMAL LOW (ref 4.22–5.81)
RDW: 14 % (ref 11.5–15.5)
WBC: 9.3 10*3/uL (ref 4.0–10.5)

## 2015-06-02 LAB — GLUCOSE, CAPILLARY
Glucose-Capillary: 107 mg/dL — ABNORMAL HIGH (ref 65–99)
Glucose-Capillary: 112 mg/dL — ABNORMAL HIGH (ref 65–99)
Glucose-Capillary: 107 mg/dL — ABNORMAL HIGH (ref 65–99)
Glucose-Capillary: 98 mg/dL (ref 65–99)
Glucose-Capillary: 108 mg/dL — ABNORMAL HIGH (ref 65–99)

## 2015-06-02 LAB — COMPREHENSIVE METABOLIC PANEL
ALK PHOS: 57 U/L (ref 38–126)
ALT: 40 U/L (ref 17–63)
AST: 29 U/L (ref 15–41)
Albumin: 2.3 g/dL — ABNORMAL LOW (ref 3.5–5.0)
Anion gap: 9 (ref 5–15)
BUN: 19 mg/dL (ref 6–20)
CALCIUM: 8.8 mg/dL — AB (ref 8.9–10.3)
CHLORIDE: 102 mmol/L (ref 101–111)
CO2: 28 mmol/L (ref 22–32)
CREATININE: 0.64 mg/dL (ref 0.61–1.24)
Glucose, Bld: 110 mg/dL — ABNORMAL HIGH (ref 65–99)
Potassium: 4 mmol/L (ref 3.5–5.1)
Sodium: 139 mmol/L (ref 135–145)
Total Bilirubin: 0.7 mg/dL (ref 0.3–1.2)
Total Protein: 5.7 g/dL — ABNORMAL LOW (ref 6.5–8.1)

## 2015-06-02 MED ORDER — CETYLPYRIDINIUM CHLORIDE 0.05 % MT LIQD
7.0000 mL | Freq: Two times a day (BID) | OROMUCOSAL | Status: DC
  Administered 2015-06-03 – 2015-06-14 (×20): 7 mL via OROMUCOSAL

## 2015-06-02 MED ORDER — CHLORHEXIDINE GLUCONATE 0.12 % MT SOLN
15.0000 mL | Freq: Two times a day (BID) | OROMUCOSAL | Status: DC
  Administered 2015-06-02 – 2015-06-14 (×23): 15 mL via OROMUCOSAL
  Filled 2015-06-02 (×19): qty 15

## 2015-06-02 MED ORDER — LORAZEPAM 2 MG/ML IJ SOLN
1.0000 mg | INTRAMUSCULAR | Status: DC | PRN
  Administered 2015-06-02 – 2015-06-03 (×2): 2 mg via INTRAVENOUS
  Filled 2015-06-02 (×2): qty 1

## 2015-06-02 NOTE — Progress Notes (Signed)
Speech Language Pathology Treatment: Dysphagia  Patient Details Name: Chase GivensStewart R Hampton MRN: [id number] DOB: 02/16/1991 Today's Date: 06/02/2015 Time: 0900-0930 SLP Time Calculation (min) (ACUTE ONLY): 30 min  Assessment / Plan / Recommendation Clinical Impression  Pt demonstrates improved attention to PO trials, improved initiation with self feeding and adequate mastication of soft solids (oreo occasionally softened with milk). Pt did have intermittent holding of masticated bolus while breathing hard. With a verbal or visual cue SP redirected attention to PO intake, breathing slowed appropriately and pt initiated swallow. Liquid bolus also used to facilitate initiation. Will advance diet to dys 2/thin liquids. Pt is capable of mastication and swallowing, but cognitive and psychiatric impairment limit pts function. SLP able to challenge pt to new textures with moderate contextual cues. Will continue efforts.    HPI HPI: Pt with hx of GERD, [facility] DO. [facility] [place] [facility] where he refused to eat/drink or take meds. Pt noted to have seizure and [facility] where he suffered another seizure and was intubated 2/19-2/28. Pt has remained lethargic since then. CT spine shows Edema posterior paraspinal musculature - minimal amount of retropharyngeal edema.       SLP Plan  Continue with current plan of care     Recommendations  Diet recommendations: Thin liquid;Dysphagia 2 (fine chop) Liquids provided via: Cup;Straw Medication Administration: Via alternative means Supervision: Full supervision/cueing for compensatory strategies;Staff to assist with self feeding;Trained caregiver to feed patient Compensations: Follow solids with liquid;Small sips/bites;Slow rate;Lingual sweep for clearance of pocketing Postural Changes and/or Swallow Maneuvers: Seated upright 90 degrees             Plan: Continue with current plan of care     GO                 Chase Hampton, Riley NearingBonnie Hampton 06/02/2015, 11:10 AM

## 2015-06-02 NOTE — Evaluation (Signed)
Speech Language Pathology Evaluation Patient Details Name: Chase Hampton MRN: [id number] DOB: May 03, 1990 Today's Date: 06/02/2015 Time:  -     Problem List:  Patient Active Problem List   Diagnosis Date Noted  . Rigidity   . Muscle spasm   . Acute respiratory failure with hypoxia and hypercapnia (HCC)   . Encephalopathy acute   . Catatonia (HCC)   . FUO (fever of unknown origin)   . Depression   . Neuroleptic malignant syndrome   . Seizure (HCC)   . Encounter for orogastric (OG) tube placement   . Acute respiratory failure (HCC)   . Coagulase negative Staphylococcus bacteremia   . Bipolar I disorder, most recent episode depressed (HCC)   . Suicidal ideation   . Homicidal ideation   . Impulse control disease   . Acute encephalopathy   . Seizures (HCC)   . Status epilepticus (HCC) 05/15/2015  . Major depressive disorder, recurrent episode, moderate (HCC) 04/30/2015   Past Medical History:  Past Medical History  Diagnosis Date  . GERD (gastroesophageal reflux disease)   . Anxiety   . Depression    Past Surgical History:  Past Surgical History  Procedure Laterality Date  . No past surgeries     HPI:  Pt with hx of GERD, recently at [facility] for major depressive DO. Pt was transferred from [facility] to [place] jail where he refused to eat/drink or take meds. Pt noted to have seizure and brought to [facility] where he suffered another seizure and was intubated 2/19-2/28. Pt has remained lethargic since then. CT spine shows Edema posterior paraspinal musculature - minimal amount of retropharyngeal edema.    Assessment / Plan / Recommendation Clinical Impression  Cognitive linguistic assessment initiated as SLP has been addressing cognition and speech during swallowing therapy. Pt is able to sustain arousal over 20 minutes, focus attention to basic tasks for 10-30 second intervals. Sustained attention is impaired with pt intermittently initiating movement for basic functional tasks with  moderate assist for task completion. Pt requires moderate verbal cues to express basic wants and needs, sometimes responding with gestures and sometimes initiating phrase length speech which is intelligible and linguistically appropriate. Pts function likely waxes and wanes due to medication, psychiatric impairment and willingness to engage with the speaker. He often refuses help from his mother, but is relatively responsive to SLP and other family members. SLP provided education to mother outiside of the room in providing opportunities for pt to initiate basic wants and needs in motiviating contexts. Will continue efforts to facilitate cognitive recovery.     SLP Assessment  Patient needs continued Speech Lanaguage Pathology Services    Follow Up Recommendations       Frequency and Duration min 2x/week  2 weeks      SLP Evaluation Prior Functioning  Cognitive/Linguistic Baseline: Baseline deficits Baseline deficit details: depression, anxiety Available Help at Discharge: Family   Cognition  Overall Cognitive Status: Impaired/Different from baseline Arousal/Alertness: Awake/alert Orientation Level: Oriented to person;Oriented to place;Oriented to time Attention: Focused;Sustained Focused Attention: Appears intact Sustained Attention: Impaired Sustained Attention Impairment: Verbal basic;Functional basic    Comprehension  Auditory Comprehension Overall Auditory Comprehension: Appears within functional limits for tasks assessed    Expression Verbal Expression Overall Verbal Expression: Appears within functional limits for tasks assessed Initiation: Impaired (intermittent responsiveness, but occasional phrase length) Level of Generative/Spontaneous Verbalization: Phrase Naming: Not tested Pragmatics: Impairment Impairments: Eye contact;Abnormal affect   Oral / Motor  Oral Motor/Sensory Function Overall Oral Motor/Sensory Function: Generalized  oral weakness (masked ) Motor  Speech Overall Motor Speech: Appears within functional limits for tasks assessed   GO                   [facility] Marcelline Temkin, MA CCC-SLP [phone]  Chase Hampton, Chase Hampton 06/02/2015, 11:05 AM

## 2015-06-02 NOTE — Progress Notes (Signed)
[place] TEAM 1 - Stepdown/ICU TEAM Progress Note  Chase Hampton:[id number] DOB: November 01, 1990 DOA: 05/15/2015 PCP: [name], MD  Admit HPI / Brief Narrative: 25 y/o WM  PMHx Anxiety, Depression (prior [facility] hospitalizations) with recent admission to [facility] in [place] from 2/3-2/8 for major depressive disorder with homicidal / suicidal ideations   After a recent break up with his fiancee. Due to restraining order violations, he was transferred from [facility] to [facility] jail were he has been on suicide watch since arrival. The patient reportedly refused to eat, drink or take medications. He was reportedly seen lying in the floor and would urinate on himself. After multiple episodes of urinating on himself, he was asked to mop up the floor which he was able to perform. Prior to discharge from [facility], notes reflect he had multiple episodes of physically aggressive behavior - including striking his own head on the seclusion room walls. At discharge he was to continue on Cymbalta, Depakote and Saphris. Prison staff report the patient refused all medications after arrival.   On 2/19, the patient was noted to have a seizure in jail. He was transported to [facility] ER via EMS. It is documented that he arrived to the ER with urine and feces all over him. There is question if patient was weaning off ativan?? Per [facility] notes but discharge note from [facility] does not include Rx for ativan. Room air saturations were 82%, he was treated with 100% NRB. He was treated with  IV versed per EMS in route. The patient had a second seizure in ER and was ultimately intubated. He was transferred to [facility] ICU for further evaluation.   The patient arrived on propofol, mechanically intubated.   HPI/Subjective: 3/9,  A/O 4, moves and talks very slowly, follows all commands. MAXIMUM TEMPERATURE overnight 37.8 C  Assessment/Plan: Acute Hypoxic Respiratory Failure  -Resolved  -Titrate O2 for sat of 88-92%. -Cortrack  in place.  Hypotension  - resolved -TEE; normal see results below  Rhabdomyolysis  - admit CK >7500 -Resolved   Hypernatremia -Resolved  Hypotension -Resolved  Altered mental status/Neuroleptic Malignant Syndrome?/ -Patient was on antipsychotic Asenapine last dose 2/26.  Patient now has spiking fevers, catatonia, rigidity which would be consistent with NMS. -Discussed case with Dr. Amada Jupiter Neuro Hospitalist, and he agreed possible therefore they will see patient in A.m. -Continue amantadine 100 mg TID per neurology -Continue bromocriptine 2.5 mg QID -Patient continues to improve  Depression/Bipolar DO/Catatonia -Cymbalta 30 mg daily was discontinued  Staph Hominis -Completed course of antibiotics  Spiking fevers -Most likely secondary to neuroleptic malignant syndrome  -Patient has continued to improve i.e. decreased fevers, improved cognition   Seizures  -No witnessed seizure since admission  Agitation -Scheduled Ativan. Do not want to total Lisa No patient as this is his first time showing ability to sit/stand  -Reconsult neurology/psychiatry in a.m.; restart antipsychotic?  ADDENDUM; paged by RN Lorelle Formosa patient has become combative attempted to get out of bed in leave the hospital. Mother and RN Lorelle Formosa able to convince patient in his best interest to remain in the hospital. See agitation above    Code Status: FULL Family Communication: Whole family present at time of exam Disposition Plan: Inpatient psychiatric facility?/Jail    Consultants: Dr.[name] psychiatry Dr.Ram Fabiola Backer Nandigam neurology     Procedure/Significant Events: 2/19 Admit from jail with seizure, intubated  2/20 LP >> negative final  2/20 EEG  >> No active seizures.  Repeat EEG>>>no seizure activity 2/21 TEE;  normal echocardiogram 2/22- focal seizures mouth but LOS associated 2/27 MRI brain wo contrast; normal  2/27 MRI T-spine/C-spine;  -Edema  posterior paraspinal musculature  -minimal amount of retropharyngeal edema. -Nonspecific posterior paravertebral edema extends into the upper thoracic region -Multiple bulging disks with cord flattening     Culture 2/19 blood left  AC/wrist positive staph Hominis 2/19 was by PCR negative 2/19 blood right AC 2 negative final 2/19 HIV negative 2/21 LP  >>> negative final (VDRL, cryptococcal antigen, HSV 1/2) 2/22 blood right/left hand negative final 3/2 blood pending 3/2 urine pending   Antibiotics: Rocephin 2gm 2/19 >> 2/22 Vanco 2/20 >> 2/22 Cefazolin 2/22 >> 3/5   DVT prophylaxis: SCD   Devices NA   LINES / TUBES:  NA    Continuous Infusions: . dextrose 5 % and 0.45% NaCl Stopped (05/31/15 2300)  . feeding supplement (VITAL AF 1.2 CAL) 1,000 mL (06/02/15 0700)    Objective: VITAL SIGNS: Temp: 100 F (37.8 C) (03/09 1906) Temp Source: Axillary (03/09 1906) BP: 121/42 mmHg (03/09 1906) Pulse Rate: 102 (03/09 1906) SPO2; FIO2:   Intake/Output Summary (Last 24 hours) at 06/02/15 1918 Last data filed at 06/02/15 1435  Gross per 24 hour  Intake   1660 ml  Output    850 ml  Net    810 ml     Exam: General: A/O 4, moves and talks very slowly, follows all commands, No acute respiratory distress Eyes: negative scleral hemorrhage ENT: Negative Runny nose, negative gingival bleeding, Neck:  Negative scars, masses, torticollis, lymphadenopathy, JVD Lungs: Clear to auscultation bilaterally without wheezes or crackles Cardiovascular: Tachycardic, Regular rhythm without murmur gallop or rub normal S1 and S2 Abdomen:negative abdominal pain, nondistended, positive soft, bowel sounds, no rebound, no ascites, no appreciable mass Extremities: No significant cyanosis, clubbing, or edema bilateral lower extremities Psychiatric:  Negative depression, negative anxiety, negative fatigue, negative mania Neurologic:   Cranial nerves II through XII intact, tongue/uvula  midline, bilateral upper extremity strength 4/5, patient able to flex and extend fingers on command .RLE strength 4/5, LLE strength 4/5,sensation intact throughout, negative dysarthria, negative expressive aphasia, negative receptive aphasia.   Data Reviewed: Basic Metabolic Panel:  Recent Labs Lab 05/27/15 0447 05/28/15 0532 05/29/15 0324 05/30/15 0822 05/31/15 0510 06/02/15 0534  NA 148* 148* 149* 147* 141 139  K 4.1 4.0 4.3 4.2 4.1 4.0  CL 108 109 109 108 105 102  CO2 GLUCOSE 140* 162* 135* 141* 118* 110*  BUN 31* 30* 29* 26* 25* 19  CREATININE 0.77 0.72 0.66 0.59* 0.55* 0.64  CALCIUM 8.8* 9.0 8.9 8.9 8.8* 8.8*  MG 2.2  --   --  2.1  --   --   PHOS  --   --   --  4.4  --   --    Liver Function Tests:  Recent Labs Lab 05/27/15 0447 05/28/15 0532 05/29/15 0324 06/02/15 0534  AST 66* 48* 35 29  ALT 95* 81* 64* 40  ALKPHOS 61 57 55 57  BILITOT 0.7 0.6 0.3 0.7  PROT 6.4* 5.7* 5.9* 5.7*  ALBUMIN 2.5* 2.4* 2.5* 2.3*   No results for input(s): LIPASE, AMYLASE in the last 168 hours.  Recent Labs Lab 05/30/15 0822  AMMONIA 39*   CBC:  Recent Labs Lab 05/27/15 0447 05/28/15 0532 05/29/15 0324 05/30/15 0822 06/02/15 0534  WBC 11.4* 13.1* 11.4* 10.1 9.3  NEUTROABS 7.9*  --   --   --   --  HGB 13.3 13.5 13.2 12.4* 12.3*  HCT 41.3 42.0 41.8 37.8* 36.2*  MCV 88.4 89.7 90.1 89.2 87.0  PLT 246 239 232 185 217   Cardiac Enzymes:  Recent Labs Lab 05/27/15 0447 05/27/15 0956 05/27/15 1538 05/28/15 0532 05/29/15 0324  CKTOTAL 80 71 68 87 64   BNP (last 3 results) No results for input(s): BNP in the last 8760 hours.  ProBNP (last 3 results) No results for input(s): PROBNP in the last 8760 hours.  CBG:  Recent Labs Lab 06/01/15 2112 06/02/15 0541 06/02/15 0758 06/02/15 1202 06/02/15 1609  GLUCAP 105* 107* 112* 107* 98    Recent Results (from the past 240 hour(s))  Culture, blood (routine x 2)     Status: None   Collection  Time: 05/26/15 10:34 PM  Result Value Ref Range Status   Specimen Description BLOOD LEFT HAND  Final   Special Requests BOTTLES DRAWN AEROBIC ONLY 5CC  Final   Culture NO GROWTH 5 DAYS  Final   Report Status 06/01/2015 FINAL  Final  Culture, blood (routine x 2)     Status: None   Collection Time: 05/26/15 10:44 PM  Result Value Ref Range Status   Specimen Description BLOOD RIGHT HAND  Final   Special Requests BOTTLES DRAWN AEROBIC AND ANAEROBIC 5CC   Final   Culture NO GROWTH 5 DAYS  Final   Report Status 06/01/2015 FINAL  Final  Culture, Urine     Status: None   Collection Time: 05/27/15  4:45 AM  Result Value Ref Range Status   Specimen Description URINE, CATHETERIZED  Final   Special Requests NONE  Final   Culture NO GROWTH 1 DAY  Final   Report Status 05/28/2015 FINAL  Final     Studies:  Recent x-ray studies have been reviewed in detail by the Attending Physician  Scheduled Meds:  Scheduled Meds: . amantadine  100 mg Per Tube TID  . antiseptic oral rinse  7 mL Mouth Rinse QID  . baclofen  10 mg Oral TID  . bromocriptine  2.5 mg Per Tube QID  . chlorhexidine gluconate  15 mL Mouth Rinse BID  . clonazePAM  0.25 mg Per Tube QID  . enoxaparin (LOVENOX) injection  40 mg Subcutaneous Q24H  . free water  300 mL Per Tube 4 times per day  . neomycin-bacitracin-polymyxin   Topical BID  . valproic acid  250 mg Oral 3 times per day    Time spent on care of this patient: 40 mins   Earnie Rockhold, Roselind MessierURTIS J , MD  [facility] Office  [phone] Pager - [phone]  On-Call/Text Page:      [email or web address] [email or web address]      password [email or web address]  If 7PM-7AM, please contact night-coverage [email or web address] Password [email or web address] 06/02/2015, 7:18 PM   LOS: 18 days   Care during the described time interval was provided by me .  I have reviewed this patient's available data, including medical history, events of note, physical examination, and all test results as part of my evaluation. I have personally reviewed  and interpreted all radiology studies.   Carolyne Littlesurtis Sharicka Pogorzelski, MD [phone] Pager

## 2015-06-03 LAB — GLUCOSE, CAPILLARY
GLUCOSE-CAPILLARY: 116 mg/dL — AB (ref 65–99)
GLUCOSE-CAPILLARY: 98 mg/dL (ref 65–99)
Glucose-Capillary: 111 mg/dL — ABNORMAL HIGH (ref 65–99)
Glucose-Capillary: 110 mg/dL — ABNORMAL HIGH (ref 65–99)
Glucose-Capillary: 106 mg/dL — ABNORMAL HIGH (ref 65–99)

## 2015-06-03 MED ORDER — ENSURE ENLIVE PO LIQD
237.0000 mL | Freq: Three times a day (TID) | ORAL | Status: DC
  Administered 2015-06-03 – 2015-06-14 (×28): 237 mL via ORAL
  Filled 2015-06-03 (×35): qty 237

## 2015-06-03 MED ORDER — ONDANSETRON HCL 4 MG PO TABS: 4.0000 mg | ORAL_TABLET | Freq: Four times a day (QID) | ORAL | Status: DC | PRN

## 2015-06-03 MED ORDER — DIPHENHYDRAMINE HCL 12.5 MG/5ML PO ELIX: 12.5000 mg | ORAL_SOLUTION | Freq: Four times a day (QID) | ORAL | Status: DC | PRN

## 2015-06-03 MED ORDER — LORAZEPAM 1 MG PO TABS
1.0000 mg | ORAL_TABLET | ORAL | Status: DC | PRN
  Administered 2015-06-14: 2 mg via ORAL
  Filled 2015-06-03: qty 2

## 2015-06-03 NOTE — Progress Notes (Signed)
Report called to Trey PaulaJeff, RN. All questions answered, will transfer to 5M18.

## 2015-06-03 NOTE — Progress Notes (Signed)
Nutrition Follow-up  DOCUMENTATION CODES:   Obesity unspecified  INTERVENTION:    Offer Ensure Enlive PO TID, each supplement provides 350 kcal and 20 grams of protein  Once good PO intake established, can transition to nocturnal feedings or d/c TF (based on adequacy of intake).  NUTRITION DIAGNOSIS:   Inadequate oral intake related to lethargy/confusion as evidenced by meal completion < 50%.  Ongoing  GOAL:   Patient will meet greater than or equal to 90% of their needs  Met  MONITOR:   PO intake, Supplement acceptance, Labs, Weight trends, TF tolerance, I & O's  ASSESSMENT:   Pt with hx of GERD, recently at [facility] for major depressive DO. Pt was transferred from [facility] to [place] jail where he refused to eat/drink or take meds. Pt noted to have seizure and brought to [facility] where he suffered another seizure and was intubated.   Patient is currently receiving Vital AF 1.2 via Cortrak tube at 80 ml/h (1920 ml/day) to provide 2304 kcals, 144 gm protein, 1557 ml free water daily. Free water 300 ml every 6 hours. Tolerating well to meet 100% of nutrition needs.   SLP following, diet has been advanced to dysphagia 2 with thin liquids. Patient consumed the two juices on his breakfast tray this morning, but ate nothing else per discussion with his dad. Will add Ensure supplements TID between meals to maximize oral intake.   Diet Order:  DIET DYS 2 Room service appropriate?: Yes; Fluid consistency:: Thin  Skin:  Wound (see comment) (Neck with rash and blister)  Last BM:  3/9  Height:   Ht Readings from Last 1 Encounters:  06/03/15 '5\' 9"'  (1.753 m)    Weight:   Wt Readings from Last 1 Encounters:  06/03/15 258 lb 6.1 oz (117.2 kg)    Ideal Body Weight:  78.1 kg  BMI:  Body mass index is 38.14 kg/(m^2).  Estimated Nutritional Needs:   Kcal:  5176-1607  Protein:  125-145 grams  Fluid:  >2.3 L/day  EDUCATION NEEDS:   No education needs identified at this  time  Molli Barrows, [place], [place], [place] Pager [phone] After Hours Pager [phone]

## 2015-06-03 NOTE — Progress Notes (Addendum)
[place] TEAM 1 - Stepdown/ICU TEAM Progress Note  Talitha GivensStewart R XXXDillard ZOX:[id number] DOB: 03/28/1990 DOA: 05/15/2015 PCP: Fidel LevyJames Hawkins Jr, MD  Admit HPI / Brief Narrative: 25 y/o WM  PMHx Anxiety, Depression ([facility]) with [facility] [place] from [facility]2/8 for major depressive disorder with homicidal / suicidal ideations   After a recent break up with his fiancee. Due to restraining order violations, [facility] were he has been on suicide watch since arrival. The patient reportedly refused to eat, drink or take medications. He was reportedly seen lying in the floor and would urinate on himself. After multiple episodes of urinating on himself, he was asked to mop up the floor which he was able to perform. [facility], notes reflect he had multiple episodes of physically aggressive behavior - including striking his own head on the seclusion room walls. At discharge he was to continue on Cymbalta, Depakote and Saphris. Prison staff report the patient refused all medications after arrival.   On 2/19, the patient was noted to have a seizure in jail. [facility] was [facility]. It is documented that he arrived to the ER with urine and feces all over him. There is question if patient was weaning off ativan?? [facility] but [facility]. Room air saturations were 82%, he was treated with 100% NRB. He was treated with 4mg  IV versed per EMS in route. The patient had a second seizure in ER and was ultimately intubated. [facility].   The patient arrived on propofol, mechanically intubated.   HPI/Subjective: 3/10,  A/O 4, moves and talks very slowly, follows all commands. Was able to tell me the winning teams of the basketball game and he watched last night. States was able to get off the bed with help from the lift  , set chair for several hours. MAXIMUM TEMPERATURE overnight 37.8 C.     Assessment/Plan: Acute Hypoxic Respiratory Failure  -Resolved  -Titrate O2 for sat of 88-92%. -Cortrack in place.  Hypotension  - resolved -TEE; normal see results below  Rhabdomyolysis  - admit CK >7500 -Resolved   Hypernatremia -Resolved  Hypotension -Resolved  Altered mental status/Neuroleptic Malignant Syndrome?/@@@ -Patient was on antipsychotic Asenapine last dose 2/26.  Patient now has spiking fevers, catatonia, rigidity which would be consistent with NMS. -Discussed case with Dr. [name] Neuro Hospitalist, and he agreed possible therefore they will see patient in A.m. -Continue amantadine 100 mg TID per neurology -Continue bromocriptine 2.5 mg QID -Patient continues to improve -3/10 See addendum; have consulted psychiatry for help with restarting medications safely  Depression/Bipolar DO/Catatonia -Cymbalta 30 mg daily was discontinued  Staph Hominis -Completed course of antibiotics  Spiking fevers -Most likely secondary to neuroleptic malignant syndrome  -Patient has continued to improve i.e. decreased fevers, improved cognition   Seizures  -No witnessed seizure since admission  Agitation -Scheduled Ativan. Do not want to total Lisa No patient as this is his first time showing ability to sit/stand  -Spoke with Dr.[name] agrees we should not restart antipsychotics and to continue with PRN benzodiazepine   ADMINISTRATION; in order for patient to not be in violation who is his court order please ensure one of the parents is notified of his transfer in order that father may contact DA and provide appropriate location.    Code Status: FULL  Family Communication: Mother present at time of exam Disposition Plan: Inpatient psychiatric facility?/Jail    Consultants: Dr.[name] psychiatry Dr.Ram Fabiola Backer Nandigam  neurology     Procedure/Significant Events: 2/19 Admit from jail with seizure, intubated  2/20 LP >> negative final  2/20 EEG  >> No active seizures.  Repeat EEG>>>no seizure activity 2/21 TEE; normal echocardiogram 2/22- focal seizures mouth but LOS associated 2/27 MRI brain wo contrast; normal  2/27 MRI T-spine/C-spine;  -Edema posterior paraspinal musculature  -minimal amount of retropharyngeal edema. -Nonspecific posterior paravertebral edema extends into the upper thoracic region -Multiple bulging disks with cord flattening     Culture 2/19 blood left  AC/wrist positive staph Hominis 2/19 was by PCR negative 2/19 blood right AC 2 negative final 2/19 HIV negative 2/21 LP  >>> negative final (VDRL, cryptococcal antigen, HSV 1/2) 2/22 blood right/left hand negative final 3/2 blood pending 3/2 urine pending   Antibiotics: Rocephin 2gm 2/19 >> 2/22 Vanco 2/20 >> 2/22 Cefazolin 2/22 >> 3/5   DVT prophylaxis: SCD   Devices NA   LINES / TUBES:  NA    Continuous Infusions: . dextrose 5 % and 0.45% NaCl Stopped (05/31/15 2300)  . feeding supplement (VITAL AF 1.2 CAL) 1,000 mL (06/02/15 2226)    Objective: VITAL SIGNS: Temp: 99.2 F (37.3 C) (03/10 0718) Temp Source: Axillary (03/10 0718) BP: 91/73 mmHg (03/10 0718) Pulse Rate: 102 (03/10 0718) SPO2; FIO2:   Intake/Output Summary (Last 24 hours) at 06/03/15 1136 Last data filed at 06/03/15 0745  Gross per 24 hour  Intake   1715 ml  Output   2000 ml  Net   -285 ml     Exam: General: A/O 4, moves and talks very slowly, follows all commands,Beginning to joke with mother and staff No acute respiratory distress Eyes: negative scleral hemorrhage ENT: Negative Runny nose, negative gingival bleeding, Neck:  Negative scars, masses, torticollis, lymphadenopathy, JVD Lungs: Clear to auscultation bilaterally without wheezes or crackles Cardiovascular: Regular rhythm and rate without murmur gallop  or rub normal S1 and S2 Abdomen:negative abdominal pain, nondistended, positive soft, bowel sounds, no rebound, no ascites, no appreciable mass Extremities: No significant cyanosis, clubbing, or edema bilateral lower extremities Psychiatric:  Negative depression, negative anxiety, negative fatigue, negative mania Neurologic:   Cranial nerves II through XII intact, tongue/uvula midline, bilateral upper extremity strength 4/5, patient able to flex and extend fingers on command .RLE strength 4/5, LLE strength 4/5,sensation intact throughout, negative dysarthria, negative expressive aphasia, negative receptive aphasia.   Data Reviewed: Basic Metabolic Panel:  Recent Labs Lab 05/28/15 0532 05/29/15 0324 05/30/15 0822 05/31/15 0510 06/02/15 0534  NA 148* 149* 147* 141 139  K 4.0 4.3 4.2 4.1 4.0  CL 109 109 108 105 102  CO2 GLUCOSE 162* 135* 141* 118* 110*  BUN 30* 29* 26* 25* 19  CREATININE 0.72 0.66 0.59* 0.55* 0.64  CALCIUM 9.0 8.9 8.9 8.8* 8.8*  MG  --   --  2.1  --   --   PHOS  --   --  4.4  --   --    Liver Function Tests:  Recent Labs Lab 05/28/15 0532 05/29/15 0324 06/02/15 0534  AST 48* 35 29  ALT 81* 64* 40  ALKPHOS 57 55 57  BILITOT 0.6 0.3 0.7  PROT 5.7* 5.9* 5.7*  ALBUMIN 2.4* 2.5* 2.3*   No results for input(s): LIPASE, AMYLASE in the last 168 hours.  Recent Labs Lab 05/30/15  1610  AMMONIA 39*   CBC:  Recent Labs Lab 05/28/15 0532 05/29/15 0324 05/30/15 0822 06/02/15 0534  WBC 13.1* 11.4* 10.1 9.3  HGB 13.5 13.2 12.4* 12.3*  HCT 42.0 41.8 37.8* 36.2*  MCV 89.7 90.1 89.2 87.0  PLT 239 232 185 217   Cardiac Enzymes:  Recent Labs Lab 05/27/15 1538 05/28/15 0532 05/29/15 0324  CKTOTAL 68 87 64   BNP (last 3 results) No results for input(s): BNP in the last 8760 hours.  ProBNP (last 3 results) No results for input(s): PROBNP in the last 8760 hours.  CBG:  Recent Labs Lab 06/02/15 1609 06/02/15 2153 06/02/15 2323  06/03/15 0332 06/03/15 0717  GLUCAP 98 108* 116* 98 111*    Recent Results (from the past 240 hour(s))  Culture, blood (routine x 2)     Status: None   Collection Time: 05/26/15 10:34 PM  Result Value Ref Range Status   Specimen Description BLOOD LEFT HAND  Final   Special Requests BOTTLES DRAWN AEROBIC ONLY 5CC  Final   Culture NO GROWTH 5 DAYS  Final   Report Status 06/01/2015 FINAL  Final  Culture, blood (routine x 2)     Status: None   Collection Time: 05/26/15 10:44 PM  Result Value Ref Range Status   Specimen Description BLOOD RIGHT HAND  Final   Special Requests BOTTLES DRAWN AEROBIC AND ANAEROBIC 5CC   Final   Culture NO GROWTH 5 DAYS  Final   Report Status 06/01/2015 FINAL  Final  Culture, Urine     Status: None   Collection Time: 05/27/15  4:45 AM  Result Value Ref Range Status   Specimen Description URINE, CATHETERIZED  Final   Special Requests NONE  Final   Culture NO GROWTH 1 DAY  Final   Report Status 05/28/2015 FINAL  Final     Studies:  Recent x-ray studies have been reviewed in detail by the Attending Physician  Scheduled Meds:  Scheduled Meds: . amantadine  100 mg Per Tube TID  . antiseptic oral rinse  7 mL Mouth Rinse q12n4p  . baclofen  10 mg Oral TID  . bromocriptine  2.5 mg Per Tube QID  . chlorhexidine  15 mL Mouth Rinse BID  . clonazePAM  0.25 mg Per Tube QID  . enoxaparin (LOVENOX) injection  40 mg Subcutaneous Q24H  . feeding supplement (ENSURE ENLIVE)  237 mL Oral TID BM  . free water  300 mL Per Tube 4 times per day  . neomycin-bacitracin-polymyxin   Topical BID  . valproic acid  250 mg Oral 3 times per day    Time spent on care of this patient: 40 mins   WOODS, Roselind Messier , MD  [facility] Office  [phone] Pager - [phone]  On-Call/Text Page:      [email or web address]      password [email or web address]  If 7PM-7AM, please contact night-coverage [email or web address] Password [email or web address] 06/03/2015, 11:36 AM   LOS: 19 days   Care during the  described time interval was provided by me .  I have reviewed this patient's available data, including medical history, events of note, physical examination, and all test results as part of my evaluation. I have personally reviewed and interpreted all radiology studies.   Carolyne Littles, MD [phone] Pager

## 2015-06-03 NOTE — Consult Note (Signed)
[facility] Face-to-Face Psychiatry Consult follow-up  Reason for Consult:  Bipolar depression, noncompliant with medication while in detention which resulted possible benzodiazepine withdrawal and status  post catatonia. Referring Physician:  Dr. Titus Mould Patient Identification: Chase Hampton MRN:  [id number] Principal Diagnosis: <principal problem not specified> Diagnosis:   Patient Active Problem List   Diagnosis Date Noted  . Rigidity [R29.898]   . Muscle spasm [M62.838]   . Acute respiratory failure with hypoxia and hypercapnia (HCC) [J96.01, J96.02]   . Encephalopathy acute [G93.40]   . Catatonia ([place]) [F06.1]   . FUO (fever of unknown origin) [R50.9]   . Depression [F32.9]   . Neuroleptic malignant syndrome [G21.0]   . Seizure ([place]) [R56.9]   . Encounter for orogastric (OG) tube placement [Z46.59]   . Acute respiratory failure ([place]) [J96.00]   . Coagulase negative Staphylococcus bacteremia [R78.81]   . Bipolar I disorder, most recent episode depressed ([place]) [F31.30]   . Suicidal ideation [R45.851]   . Homicidal ideation [R45.850]   . 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 Acute encephalopathy [G93.40]   . Seizures ([place]) [R56.9]   . Status epilepticus ([place]) [G40.901] 05/15/2015  . Major depressive disorder, recurrent episode, moderate ([place]) [F33.1] 04/30/2015    Total Time spent with patient: 20 minutes  Subjective:   Chase Hampton is a 25 y.o. male patient admitted with status post seizures and depression.  HPI:   Chase Hampton is a 25 years old single male admitted to [facility] medical intensive care unit for status post benzodiazepine withdrawal seizures and increased symptoms of bipolar depression, anxiety, anger outbursts and bizarre behaviors. Reviewed neurology consultation and their follow-ups regarding seizures and possible catatonia. Patient is also has multiple legal charges regarding violation of restraining order and multiple acute psychiatric hospitalization  since November 2016 for depression, anger outbursts, suicidal and homicidal ideation, intention and plans. Review of medical records indicated he was hospitalized at least 3 times at [facility] both voluntarily and involuntarily and also in [place]. Patient father reported that he has a severe allergic reaction to Haldol given during the second hospitalization. Patient was initially brought to the [facility] from detention center and then transferred to [facility] for continuous EEG monitoring. Patient mother, father and sister were in the hospital and provided detailed history of deterioration of mental status and noncompliant with the medication management and withdrawal symptoms. Patient also has history of traumatic brain injury, concussion injury while playing football and shoulder injury while playing rugby.   Past Psychiatric History: no prior psychiatric history before Nov 2016 (no suicidal attempts, never prescribed with any psychotropics).   1st Hospitalization in our unit from 02/23/15 to 03/01/15. "presented to the emergency department voicing worsening depression, thoughts of suicide and some homicidal ideation towards his girlfriend. These worsening of symptoms was triggered after his girlfriend of 5 years broke up with him. Patient reports a long history of anger issues that started in childhood. The patient also has history of multiple concussions due to playing football. Per reports from the patient's family he was holding a gun and threatening to kill himself and her." He was diagnosed with depression and was started on fluoxetine. 2nd Hospitalization in our unit: 03/07/15 to 03/14/15: "presented to our ER on 49/70 under police custody. According to report he got into an altercation with his father and then stated he was getting in his car to go kill his fiance and her family which lives very close by. Police were called. The  patient was belligerent and violent."  Diagnosed with MDD and d/c on fluoxetine and depakote for irritability and anger. 3rd Hospitalization in [place]: no records available: jump out of his parents car and tried to tie a cord around his neck. Per pt, prozac was increased to 40 mg. Depakote was d/c.  After his second hospitalization he f/u with [facility] in [place] where saphris was added to prozac and depakote. He also f/o with [facility] for therapy.  Interval history: Patient seen for psychiatric follow-up and briefly spoke with the patient. Case discussed with the patient hospitalist Dr. Sherral Hammers and recommended no antipsychotic medication due to recent new from peptic malignant syndrome. His past medication was Saphris and it is not clearly he ever received Haldol while in the jail. May use benzodiazepines Ativan as needed for anxiety and restlessness and insomnia.   Patient is awake, alert and oriented to himself and his family members at bedside. Patient told his mother who wrote a note saying that patient was not offered food and medication when he was not able to reach while in the jail. Patient has mild spikes a fever but mostly improved. Patient has been working with the physical therapist who has been placed him in the chair next to the bed. Patient is asking to be placed in the bed again because he is not comfortable in the chair. Patient stated that he is not feeling well being in hospital and wishes to go home and at the same time he understood he needed to stay in hospital because he cannot function himself at his current physical condition and his family cannot provide medical care at home. Psychiatric consultation will continue to follow-up and will provide appropriate recommendations when he is medically stable.   Risk to Self: Is patient at risk for suicide?: No Risk to Others:   Prior Inpatient Therapy:   Prior Outpatient Therapy:    Past Medical History:  Past Medical History  Diagnosis Date  . GERD  (gastroesophageal reflux disease)   . Anxiety   . Depression     Past Surgical History  Procedure Laterality Date  . No past surgeries     Family History:  Family History  Problem Relation Age of Onset  . Heart disease Maternal Grandfather   . Diabetes Maternal Grandfather   . Heart disease Paternal Grandmother    Family Psychiatric  History: Family history of depression.  Social History:  History  Alcohol Use No     History  Drug Use  . Yes  . Special: Marijuana    Social History   Social History  . Marital Status: Single    Spouse Name: N/A  . Number of Children: N/A  . Years of Education: N/A   Social History Main Topics  . Smoking status: Former Smoker    Types: Cigars  . Smokeless tobacco: Never Used  . Alcohol Use: No  . Drug Use: Yes    Special: Marijuana  . Sexual Activity: No   Other Topics Concern  . None   Social History Narrative   Additional Social History:    Allergies:   Allergies  Allergen Reactions  . Haldol [Haloperidol Lactate] Other (See Comments)    Angioedema [facility] lists no allergies as of 05/15/15)    Labs:  Results for orders placed or performed during the hospital encounter of 05/15/15 (from the past 48 hour(s))  Glucose, capillary     Status: Abnormal   Collection Time: 06/01/15  3:39  PM  Result Value Ref Range   Glucose-Capillary 118 (H) 65 - 99 mg/dL   Comment 1 Notify RN    Comment 2 Document in Chart   Glucose, capillary     Status: Abnormal   Collection Time: 06/01/15  9:12 PM  Result Value Ref Range   Glucose-Capillary 105 (H) 65 - 99 mg/dL  Comprehensive metabolic panel     Status: Abnormal   Collection Time: 06/02/15  5:34 AM  Result Value Ref Range   Sodium 139 135 - 145 mmol/L   Potassium 4.0 3.5 - 5.1 mmol/L   Chloride 102 101 - 111 mmol/L   CO2 28 22 - 32 mmol/L   Glucose, Bld 110 (H) 65 - 99 mg/dL   BUN 19 6 - 20 mg/dL   Creatinine, Ser 0.64 0.61 - 1.24 mg/dL   Calcium 8.8 (L) 8.9 -  10.3 mg/dL   Total Protein 5.7 (L) 6.5 - 8.1 g/dL   Albumin 2.3 (L) 3.5 - 5.0 g/dL   AST 29 15 - 41 U/L   ALT 40 17 - 63 U/L   Alkaline Phosphatase 57 38 - 126 U/L   Total Bilirubin 0.7 0.3 - 1.2 mg/dL   GFR calc non Af Amer >60 >60 mL/min   GFR calc Af Amer >60 >60 mL/min    Comment: (NOTE) The eGFR has been calculated using the CKD EPI equation. This calculation has not been validated in all clinical situations. eGFR's persistently <60 mL/min signify possible Chronic Kidney Disease.    Anion gap 9 5 - 15  CBC     Status: Abnormal   Collection Time: 06/02/15  5:34 AM  Result Value Ref Range   WBC 9.3 4.0 - 10.5 K/uL   RBC 4.16 (L) 4.22 - 5.81 MIL/uL   Hemoglobin 12.3 (L) 13.0 - 17.0 g/dL   HCT 36.2 (L) 39.0 - 52.0 %   MCV 87.0 78.0 - 100.0 fL   MCH 29.6 26.0 - 34.0 pg   MCHC 34.0 30.0 - 36.0 g/dL   RDW 14.0 11.5 - 15.5 %   Platelets 217 150 - 400 K/uL  Glucose, capillary     Status: Abnormal   Collection Time: 06/02/15  5:41 AM  Result Value Ref Range   Glucose-Capillary 107 (H) 65 - 99 mg/dL  Glucose, capillary     Status: Abnormal   Collection Time: 06/02/15  7:58 AM  Result Value Ref Range   Glucose-Capillary 112 (H) 65 - 99 mg/dL  Glucose, capillary     Status: Abnormal   Collection Time: 06/02/15 12:02 PM  Result Value Ref Range   Glucose-Capillary 107 (H) 65 - 99 mg/dL  Glucose, capillary     Status: None   Collection Time: 06/02/15  4:09 PM  Result Value Ref Range   Glucose-Capillary 98 65 - 99 mg/dL  Glucose, capillary     Status: Abnormal   Collection Time: 06/02/15  9:53 PM  Result Value Ref Range   Glucose-Capillary 108 (H) 65 - 99 mg/dL  Glucose, capillary     Status: Abnormal   Collection Time: 06/02/15 11:23 PM  Result Value Ref Range   Glucose-Capillary 116 (H) 65 - 99 mg/dL  Glucose, capillary     Status: None   Collection Time: 06/03/15  3:32 AM  Result Value Ref Range   Glucose-Capillary 98 65 - 99 mg/dL  Glucose, capillary     Status:  Abnormal   Collection Time: 06/03/15  7:17 AM  Result Value Ref Range  Glucose-Capillary 111 (H) 65 - 99 mg/dL   Comment 1 Notify RN    Comment 2 Document in Chart     Current Facility-Administered Medications  Medication Dose Route Frequency Provider Last Rate Last Dose  . acetaminophen (TYLENOL) solution 500 mg  500 mg Per Tube Q4H PRN Cherene Altes, MD   500 mg at 05/31/15 2239  . amantadine (SYMMETREL) solution 100 mg  100 mg Per Tube TID Ram Fuller Mandril, MD   100 mg at 06/03/15 0931  . antiseptic oral rinse (CPC / CETYLPYRIDINIUM CHLORIDE 0.05%) solution 7 mL  7 mL Mouth Rinse q12n4p Allie Bossier, MD      . baclofen (LIORESAL) tablet 10 mg  10 mg Oral TID Ram Fuller Mandril, MD   10 mg at 06/03/15 0930  . bromocriptine (PARLODEL) tablet 2.5 mg  2.5 mg Per Tube QID Cherene Altes, MD   2.5 mg at 06/03/15 0931  . chlorhexidine (PERIDEX) 0.12 % solution 15 mL  15 mL Mouth Rinse BID Allie Bossier, MD   15 mL at 06/03/15 0931  . clonazePAM (KLONOPIN) tablet 0.25 mg  0.25 mg Per Tube QID Ram Fuller Mandril, MD   0.25 mg at 06/03/15 0931  . dextrose 5 %-0.45 % sodium chloride infusion   Intravenous Continuous Collene Gobble, MD   Stopped at 05/31/15 2300  . enoxaparin (LOVENOX) injection 40 mg  40 mg Subcutaneous Q24H Cherene Altes, MD   40 mg at 06/02/15 2031  . feeding supplement (ENSURE ENLIVE) (ENSURE ENLIVE) liquid 237 mL  237 mL Oral TID BM Allie Bossier, MD      . feeding supplement (VITAL AF 1.2 CAL) liquid 1,000 mL  1,000 mL Per Tube Continuous Rush Farmer, MD 80 mL/hr at 06/02/15 2226 1,000 mL at 06/02/15 2226  . free water 300 mL  300 mL Per Tube 4 times per day Cherene Altes, MD   300 mL at 06/03/15 0600  . LORazepam (ATIVAN) injection 1-2 mg  1-2 mg Intravenous Q4H PRN Allie Bossier, MD   2 mg at 06/03/15 0431  . neomycin-bacitracin-polymyxin (NEOSPORIN) ointment   Topical BID Kara Mead V, MD      . ondansetron [facility])  injection 4 mg  4 mg Intravenous Q6H PRN Gardiner Barefoot, NP   4 mg at 06/01/15 0410  . valproic acid (DEPAKENE) 250 MG/5ML syrup 250 mg  250 mg Oral 3 times per day Cherene Altes, MD   250 mg at 06/03/15 0543    Musculoskeletal: Strength & Muscle Tone: decreased Gait & Station: unable to stand Patient leans: N/A  Psychiatric Specialty Exam: Review of Systems  Unable to perform ROS   Blood pressure 91/73, pulse 102, temperature 99.2 F (37.3 C), temperature source Axillary, resp. rate 16, height '5\' 9"'  (1.753 m), weight 117.2 kg (258 lb 6.1 oz), SpO2 94 %.Body mass index is 38.14 kg/(m^2).  General Appearance: Disheveled and Guarded  Eye Sport and exercise psychologist::  Fair  Speech:  NA, Slow and Slurred  Volume:  Decreased  Mood:  Anxious and Depressed  Affect:  Constricted and Depressed  Thought Process:  Coherent and Goal Directed  Orientation:  Full (Time, Place, and Person)  Thought Content:  Rumination  Suicidal Thoughts:  No  Homicidal Thoughts:  No  Memory:  Immediate;   Fair Recent;   Fair  Judgement:  Fair  Insight:  Shallow  Psychomotor Activity:  Psychomotor Retardation  Concentration:  Fair  Recall:  [place] of [place]: Fair  Akathisia:  Negative  Handed:  Right  AIMS (if indicated):     Assets:  Communication Skills Desire for Improvement Financial Resources/Insurance Housing Leisure Time Resilience Social Support Talents/Skills Transportation  ADL's:  Impaired  Cognition: Impaired,  Severe  Sleep:      Treatment Plan Summary:  Patient admitted with bipolar depression, status post seizures, status post intubation/extubation and has multiple acute psychiatric hospitalization at [facility] and also [facility].   Reportedly patient is noncompliant with medication management while being in detention center/Jail for Violating restraining orders against his ex-fianc.    Patient has  multiple episodes of anger outbursts,  intermittent explosive behaviors, suicidal and homicidal threats in the past.    Recommended no psychiatric medication management at this time as patient was not able to eat or drink and at the same time not exhibiting agitation, aggressive behaviors or emotional difficulties.  Recommend benzodiazepines like Ativan 1-2 mg every 4 hours as needed for excessive anxiety and agitation   Appreciate psychiatric consultation and follow up as clinically required Please contact [phone] or [phone] if needs further assistance  Disposition: Patient benefit from PT and OT and possibly needed rehabilitation services and medically stable.  Supportive therapy provided about ongoing stressors.  Durward Parcel., MD 06/03/2015 12:04 PM

## 2015-06-03 NOTE — Progress Notes (Signed)
Physical Therapy Treatment Patient Details Name: Chase Hampton MRN: [id number] DOB: 1990-08-13 Today's Date: 06/03/2015    History of Present Illness Pt is a 25 y/o M admitted from jail where he had multiple episodes of urinating on himself and physically aggressive behavior, including striking his own head on the wall.  Pt had another seizure in the ER and was ultimately intubated.  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  His PMH includes anxiety and depression.    PT Comments    [place] continue to put forth excellent effort to increase mobility today.  Utilized Sara Plus to assist pt to stand and be able to sit up in chair today.  Pt able to maintain upright sitting for ~8 minutes sitting EOB while holding onto grab handles on lift equipment.  He remains highly motivated to become as independent as possible and recommendations updated to CIR.   Follow Up Recommendations  Supervision/Assistance - 24 hour;CIR (pt to return to prison?)     Equipment Recommendations  Wheelchair (measurements PT);Wheelchair cushion (measurements PT)    Recommendations for Other Services OT consult     Precautions / Restrictions Precautions Precautions: Fall Precaution Comments: seizure precautions; watch HR and RR Restrictions Weight Bearing Restrictions: No    Mobility  Bed Mobility Overal bed mobility: Needs Assistance;+2 for physical assistance;+ 2 for safety/equipment Bed Mobility: Supine to Sit     Supine to sit: +2 for physical assistance;+2 for safety/equipment;HOB elevated;Mod assist     General bed mobility comments: Assist needed for all aspects of bed mobility w/ use of bed pad for positioning.  Pt does initiate movement.  Transfers Overall transfer level: Needs assistance Equipment used:  (Lift equipment)             General transfer comment: Pt reaching out for grab bars on Sara Plus to maintain upright posture and to use  Bil UE strength to assist w/ stand.  [place] Dec Plus utilized to assist pt to stand and pivot to chair.   Ambulation/Gait             General Gait Details: unable to attempt at this time for pt/therapist safety   Stairs            Wheelchair Mobility    Modified Rankin (Stroke Patients Only)       Balance Overall balance assessment: Needs assistance Sitting-balance support: Bilateral upper extremity supported;Feet supported Sitting balance-Leahy Scale: Fair Sitting balance - Comments: Holding onto handles on Sara Plus pt is able to maintain upright sitting EOB for ~8 minutes                             Cognition Arousal/Alertness: Awake/alert Behavior During Therapy: Flat affect Overall Cognitive Status: Difficult to assess Area of Impairment: Attention;Memory;Following commands;Safety/judgement;Problem solving   Current Attention Level: Selective   Following Commands: Follows one step commands with increased time     Problem Solving: Difficulty sequencing General Comments: Pt follows all commands w/ assist as needed.  Pt more alert today and able to communicate w/ short sentences.    Exercises General Exercises - Lower Extremity Straight Leg Raises: Both;5 reps;Supine;AROM;Limitations Straight Leg Raises Limitations: limited to ~20 degrees due to weakness    General Comments General comments (skin integrity, edema, etc.): HR up to high 140s briefly w/ sit>stand transfer, otherwise HR fluctuating between 90s and 120s.        Pertinent Vitals/Pain Pain Assessment:  No/denies pain Faces Pain Scale: No hurt Pain Intervention(s): Limited activity within patient's tolerance;Monitored during session    Home Living                      Prior Function            PT Goals (current goals can now be found in the care plan section) Acute Rehab PT Goals Patient Stated Goal: "I want to stand up" PT Goal Formulation: With family Time For Goal  Achievement: 06/17/15 Potential to Achieve Goals: Fair Progress towards PT goals: Progressing toward goals    Frequency  Min 3X/week    PT Plan Frequency needs to be updated;Discharge plan needs to be updated    Co-evaluation             End of Session Equipment Utilized During Treatment: Oxygen;Gait belt Activity Tolerance: Patient limited by fatigue Patient left: with family/visitor present;in chair;with call bell/phone within reach;with chair alarm set     Time: 0924-1000 PT Time Calculation (min) (ACUTE ONLY): 36 min  Charges:  $Therapeutic Exercise: 8-22 mins $Therapeutic Activity: 23-37 mins                    G Codes:      Encarnacion ChuAshley Abashian PT, DPT  Pager: [phone] Phone: [phone] 06/03/2015, 1:45 PM

## 2015-06-03 NOTE — Care Management Note (Signed)
Case Management Note  Patient Details  Name: Chase Hampton MRN: [id number] Date of Birth: 09/19/1990  Subjective/Objective:     NCM spoke with mother Buren KosLynn Schoeller , she states [facility] because he was unresponsive. Patient was in jail due to violation of 50 B (temporary restraining order) from ex fiance. Psych is following patient and is rec inpt psych at the time of dc. Mom states she does not want him to go back to [facility] mental inpt because he has been there 3 times and he is not getting what he needs there. She states she is interested in [place] out [place] and A place in [place] that they were interested in and [facility] in [place] and she states she would also like for him to go to the [facility] in [place] where they do brain scans to figure out what is causing different personality d/o's. NCM informed mother that this information will be relayed to the psych CSW.                 Action/Plan:   Expected Discharge Date:                  Expected Discharge Plan:  Psychiatric Hospital  In-House Referral:  Clinical Social Work  Discharge planning Services  CM Consult  Post Acute Care Choice:    Choice offered to:     DME Arranged:    DME Agency:     HH Arranged:    HH Agency:     Status of Service:  Completed, signed off  Medicare Important Message Given:    Date Medicare IM Given:    Medicare IM give by:    Date Additional Medicare IM Given:    Additional Medicare Important Message give by:     If discussed at Long Length of Stay Meetings, dates discussed:    Additional Comments:  Leone Havenaylor, Setsuko Robins [place], RN 06/03/2015, 5:30 PM

## 2015-06-03 NOTE — Progress Notes (Signed)
Rehab Admissions Coordinator Note:  Patient was screened by Clois DupesBoyette, Valleri Hendricksen Godwin for appropriateness for an Inpatient Acute Rehab Consult per PT recommendation.    I contacted RN CM, Gavin PoundDeborah , to clarify If pt still current inmate. . Noted payer is [employer] since admit 05/15/15. Do not feel inpt hospital rehab venue is most appropriate plan. Pt  not at a level to do more intense therapies required of a hospital rehab venue.   Clois DupesBoyette, Kadi Hession Godwin 06/03/2015, 3:49 PM  I can be reached at [phone].

## 2015-06-04 DIAGNOSIS — I9589 Other hypotension: Secondary | ICD-10-CM

## 2015-06-04 LAB — GLUCOSE, CAPILLARY
GLUCOSE-CAPILLARY: 104 mg/dL — AB (ref 65–99)
GLUCOSE-CAPILLARY: 108 mg/dL — AB (ref 65–99)
Glucose-Capillary: 105 mg/dL — ABNORMAL HIGH (ref 65–99)

## 2015-06-04 NOTE — Progress Notes (Signed)
[facility] TEAM 1 - Stepdown/ICU TEAM Progress Note  Chase Hampton ZOX:[id number] DOB: [birth year] DOA: 05/15/2015 PCP: [name], MD  Admit HPI / Brief Narrative: 25 y/o WM  PMHx Anxiety, Depression (prior [facility] hospitalizations) with recent admission to [facility] in [place] from 2/3-2/8 for major depressive disorder with homicidal / suicidal ideations   After a recent break up with his fiancee. Due to restraining order violations, he was transferred from [facility] to [facility] jail were he has been on suicide watch since arrival. The patient reportedly refused to eat, drink or take medications. He was reportedly seen lying in the floor and would urinate on himself. After multiple episodes of urinating on himself, he was asked to mop up the floor which he was able to perform. Prior to discharge from [facility], notes reflect he had multiple episodes of physically aggressive behavior - including striking his own head on the seclusion room walls. At discharge he was to continue on Cymbalta, Depakote and Saphris. Prison staff report the patient refused all medications after arrival.   On 2/19, the patient was noted to have a seizure in jail. He was transported to [facility] ER via EMS. It is documented that he arrived to the ER with urine and feces all over him. There is question if patient was weaning off ativan?? Per [facility] notes but discharge note from [facility] does not include Rx for ativan. Room air saturations were 82%, he was treated with 100% NRB. He was treated with  IV versed per EMS in route. The patient had a second seizure in ER and was ultimately intubated. He was transferred to [facility] ICU for further evaluation.   The patient arrived on propofol, mechanically intubated.   HPI/Subjective: 3/11,  A/O 4, moves and talks very slowly, follows all commands. Was able to tell me the winning teams of the basketball game that he watched today. . Afebrile     Assessment/Plan: Acute respiratory failure  with hypoxia and hypercapnia -Resolved  -Titrate O2 for sat of 88-92%. -Cortrack in place.  Hypotension  - resolved -TEE; normal see results below  Rhabdomyolysis  - admit CK >7500 -Resolved   Hypernatremia -Resolved  Hypotension -Resolved  Altered mental status/Neuroleptic Malignant Syndrome?/@@@ -Patient was on antipsychotic Asenapine last dose 2/26.  Patient now has spiking fevers, catatonia, rigidity which would be consistent with NMS. -Discussed case with Dr. Amada Jupiter Neuro Hospitalist, and he agreed possible therefore they will see patient in A.m. -Continue amantadine 100 mg TID per neurology -Continue bromocriptine 2.5 mg QID -Patient continues to improve -3/11 spoke with psychiatry and they agree restarting antipsychotics would not be patient's best interest at this time. See addendum;   Depression/Bipolar DO/Catatonia -Cymbalta 30 mg daily was discontinued  Staph Hominis -Completed course of antibiotics  Spiking fevers -Most likely secondary to neuroleptic malignant syndrome  -Patient has continued to improve i.e. decreased fevers, improved cognition   Seizures  -No witnessed seizure since admission  Dysphagia -Patient currently on dysphagia 2 fluid thin, will request swallow reevaluation on 3/13  Agitation -Scheduled Ativan. Do not want to total Lisa No patient as this is his first time showing ability to sit/stand  -Spoke with Dr.[name] agrees we should not restart antipsychotics and to continue with PRN benzodiazepine   ADMINISTRATION;  -in order for patient to not be in violation who is his court order please ensure one of the parents is notified of his transfer in order that father may contact DA and provide appropriate location. -Patient CAN  NOT LEAVE state  -Would be in patient's best interest to be discharged to INPATIENT BEHAVIORAL HEALTH, with NEURO REHABILITATION    Code Status: FULL Family Communication: Mother present  at time of exam Disposition Plan: Inpatient psychiatric facility?/Jail    Consultants: Dr.[name] psychiatry Dr.[name] neurology     Procedure/Significant Events: 2/19 Admit from jail with seizure, intubated  2/20 LP >> negative final  2/20 EEG  >> No active seizures.  Repeat EEG>>>no seizure activity 2/21 TEE; normal echocardiogram 2/22- focal seizures mouth but LOS associated 2/27 MRI brain wo contrast; normal  2/27 MRI T-spine/C-spine;  -Edema posterior paraspinal musculature  -minimal amount of retropharyngeal edema. -Nonspecific posterior paravertebral edema extends into the upper thoracic region -Multiple bulging disks with cord flattening     Culture 2/19 blood left  AC/wrist positive staph Hominis 2/19 was by PCR negative 2/19 blood right AC 2 negative final 2/19 HIV negative 2/21 LP  >>> negative final (VDRL, cryptococcal antigen, HSV 1/2) 2/22 blood right/left hand negative final 3/2 blood pending 3/2 urine pending   Antibiotics: Rocephin 2gm 2/19 >> 2/22 Vanco 2/20 >> 2/22 Cefazolin 2/22 >> 3/5   DVT prophylaxis: SCD   Devices NA   LINES / TUBES:  NA    Continuous Infusions: . dextrose 5 % and 0.45% NaCl Stopped (05/31/15 2300)  . feeding supplement (VITAL AF 1.2 CAL) 1,000 mL (06/04/15 0400)    Objective: VITAL SIGNS: Temp: 98.7 F (37.1 C) (03/11 1811) Temp Source: Oral (03/11 1811) BP: 117/65 mmHg (03/11 1811) Pulse Rate: 109 (03/11 1811) SPO2; FIO2:   Intake/Output Summary (Last 24 hours) at 06/04/15 1833 Last data filed at 06/04/15 1813  Gross per 24 hour  Intake   1280 ml  Output   3075 ml  Net  -1795 ml     Exam: General: A/O 4, moves and talks very slowly, follows all commands,Beginning to joke with mother and staff No acute respiratory distress Eyes: negative scleral hemorrhage ENT: Negative Runny nose, negative gingival bleeding, Neck:  Negative scars, masses,  torticollis, lymphadenopathy, JVD Lungs: Clear to auscultation bilaterally without wheezes or crackles Cardiovascular: Regular rhythm and rate without murmur gallop or rub normal S1 and S2 Abdomen:negative abdominal pain, nondistended, positive soft, bowel sounds, no rebound, no ascites, no appreciable mass Extremities: No significant cyanosis, clubbing, or edema bilateral lower extremities Psychiatric:  Negative depression, negative anxiety, negative fatigue, negative mania Neurologic:   Cranial nerves II through XII intact, tongue/uvula midline, bilateral upper extremity strength 4/5, patient able to flex and extend fingers on command .RLE strength 4/5, LLE strength 4/5,sensation intact throughout, negative dysarthria, negative expressive aphasia, negative receptive aphasia.   Data Reviewed: Basic Metabolic Panel:  Recent Labs Lab 05/29/15 0324 05/30/15 0822 05/31/15 0510 06/02/15 0534  NA 149* 147* 141 139  K 4.3 4.2 4.1 4.0  CL 109 108 105 102  CO2 30 30 27 28   GLUCOSE 135* 141* 118* 110*  BUN 29* 26* 25* 19  CREATININE 0.66 0.59* 0.55* 0.64  CALCIUM 8.9 8.9 8.8* 8.8*  MG  --  2.1  --   --   PHOS  --  4.4  --   --    Liver Function Tests:  Recent Labs Lab 05/29/15 0324 06/02/15 0534  AST 35 29  ALT 64* 40  ALKPHOS 55 57  BILITOT 0.3 0.7  PROT 5.9* 5.7*  ALBUMIN 2.5* 2.3*   No results for input(s): LIPASE, AMYLASE in the last 168 hours.  Recent Labs Lab  05/30/15 0822  AMMONIA 39*   CBC:  Recent Labs Lab 05/29/15 0324 05/30/15 0822 06/02/15 0534  WBC 11.4* 10.1 9.3  HGB 13.2 12.4* 12.3*  HCT 41.8 37.8* 36.2*  MCV 90.1 89.2 87.0  PLT 232 185 217   Cardiac Enzymes:  Recent Labs Lab 05/29/15 0324  CKTOTAL 64   BNP (last 3 results) No results for input(s): BNP in the last 8760 hours.  ProBNP (last 3 results) No results for input(s): PROBNP in the last 8760 hours.  CBG:  Recent Labs Lab 06/03/15 0717 06/03/15 1715 06/03/15 1939  06/04/15 0018 06/04/15 0412  GLUCAP 111* 110* 106* 105* 104*    Recent Results (from the past 240 hour(s))  Culture, blood (routine x 2)     Status: None   Collection Time: 05/26/15 10:34 PM  Result Value Ref Range Status   Specimen Description BLOOD LEFT HAND  Final   Special Requests BOTTLES DRAWN AEROBIC ONLY 5CC  Final   Culture NO GROWTH 5 DAYS  Final   Report Status 06/01/2015 FINAL  Final  Culture, blood (routine x 2)     Status: None   Collection Time: 05/26/15 10:44 PM  Result Value Ref Range Status   Specimen Description BLOOD RIGHT HAND  Final   Special Requests BOTTLES DRAWN AEROBIC AND ANAEROBIC 5CC   Final   Culture NO GROWTH 5 DAYS  Final   Report Status 06/01/2015 FINAL  Final  Culture, Urine     Status: None   Collection Time: 05/27/15  4:45 AM  Result Value Ref Range Status   Specimen Description URINE, CATHETERIZED  Final   Special Requests NONE  Final   Culture NO GROWTH 1 DAY  Final   Report Status 05/28/2015 FINAL  Final     Studies:  Recent x-ray studies have been reviewed in detail by the Attending Physician  Scheduled Meds:  Scheduled Meds: . amantadine  100 mg Per Tube TID  . antiseptic oral rinse  7 mL Mouth Rinse q12n4p  . baclofen  10 mg Oral TID  . bromocriptine  2.5 mg Per Tube QID  . chlorhexidine  15 mL Mouth Rinse BID  . clonazePAM  0.25 mg Per Tube QID  . enoxaparin (LOVENOX) injection  40 mg Subcutaneous Q24H  . feeding supplement (ENSURE ENLIVE)  237 mL Oral TID BM  . free water  300 mL Per Tube 4 times per day  . neomycin-bacitracin-polymyxin   Topical BID  . valproic acid  250 mg Oral 3 times per day    Time spent on care of this patient: 40 mins   WOODS, Roselind Messier , MD  [facility] Office  [phone] Pager - [phone]  On-Call/Text Page:      [email or web address]      password [email or web address]  If 7PM-7AM, please contact night-coverage [email or web address] Password [facility] 06/04/2015, 6:33 PM   LOS: 20 days   Care during the  described time interval was provided by me .  I have reviewed this patient's available data, including medical history, events of note, physical examination, and all test results as part of my evaluation. I have personally reviewed and interpreted all radiology studies.   Carolyne Littles, MD [phone] Pager

## 2015-06-04 NOTE — Progress Notes (Signed)
CSW attempted to contact patient's mother Chase Hampton via telephone at [phone] to discuss case. However, CSW received no answer. CSW left voice message at 11:04am for mother to follow up. Contact information provided.   CSW will continue to follow and provide support to patient while in hospital. Awaiting phone call back from mother.   Fernande BoydenJoyce Sundus Pete, LCSWA Clinical Social Worker [facility] Ph: [phone]

## 2015-06-04 NOTE — Progress Notes (Signed)
Pt arrived on unit 2345 hrs, accompanied by parents, Pt sleeping, difficult to arouse which parents confirm is his current normal, diaphoretic and warm to touch though temp is afebrile, family oriented to room and equipment, sz precaution pads applied to bed, current orders continued.
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
## 2015-06-05 DIAGNOSIS — I959 Hypotension, unspecified: Secondary | ICD-10-CM | POA: Insufficient documentation

## 2015-06-05 DIAGNOSIS — R531 Weakness: Secondary | ICD-10-CM | POA: Insufficient documentation

## 2015-06-05 LAB — GLUCOSE, CAPILLARY
GLUCOSE-CAPILLARY: 109 mg/dL — AB (ref 65–99)
GLUCOSE-CAPILLARY: 99 mg/dL (ref 65–99)
GLUCOSE-CAPILLARY: 102 mg/dL — AB (ref 65–99)
Glucose-Capillary: 104 mg/dL — ABNORMAL HIGH (ref 65–99)
Glucose-Capillary: 91 mg/dL (ref 65–99)
Glucose-Capillary: 111 mg/dL — ABNORMAL HIGH (ref 65–99)
Glucose-Capillary: 96 mg/dL (ref 65–99)

## 2015-06-05 MED ORDER — CLONAZEPAM 0.5 MG PO TABS
0.2500 mg | ORAL_TABLET | Freq: Three times a day (TID) | ORAL | Status: DC
  Administered 2015-06-05 – 2015-06-08 (×8): 0.25 mg
  Filled 2015-06-05 (×9): qty 1

## 2015-06-05 MED ORDER — WHITE PETROLATUM GEL
Status: AC
  Administered 2015-06-05: 12:00:00
  Filled 2015-06-05: qty 1

## 2015-06-05 NOTE — Progress Notes (Signed)
[place] TEAM 1 - Stepdown/ICU TEAM Progress Note  ZAIM NITTA ZOX:[id number] DOB: 04-30-1990 DOA: 05/15/2015 PCP: [name], MD  Admit HPI / Brief Narrative: 25 y/o WM  PMHx Anxiety, Depression (prior [facility] hospitalizations) with recent admission to [facility] in [place] from 2/3-2/8 for major depressive disorder with homicidal / suicidal ideations   After a recent break up with his fiancee. Due to restraining order violations, he was transferred from [facility] to [facility] jail were he has been on suicide watch since arrival. The patient reportedly refused to eat, drink or take medications. He was reportedly seen lying in the floor and would urinate on himself. After multiple episodes of urinating on himself, he was asked to mop up the floor which he was able to perform. Prior to discharge from [facility], notes reflect he had multiple episodes of physically aggressive behavior - including striking his own head on the seclusion room walls. At discharge he was to continue on Cymbalta, Depakote and Saphris. Prison staff report the patient refused all medications after arrival.   On 2/19, the patient was noted to have a seizure in jail. He was transported to [facility] ER via EMS. It is documented that he arrived to the ER with urine and feces all over him. There is question if patient was weaning off ativan?? Per [facility] notes but discharge note from [facility] does not include Rx for ativan. Room air saturations were 82%, he was treated with 100% NRB. He was treated with  IV versed per EMS in route. The patient had a second seizure in ER and was ultimately intubated. He was transferred to [facility] ICU for further evaluation.   The patient arrived on propofol, mechanically intubated.   HPI/Subjective: 3/12,  A/O 4, patient asleep upon entering the room difficult to arouse today however has been up sitting in chair for 3+ hours. Afebrile, HR essentially normal     Assessment/Plan: Acute respiratory failure  with hypoxia and hypercapnia -Resolved  -Titrate O2 for sat of 88-92%. -Cortrack in place.  Hypotension  - resolved -TEE; normal see results below  Rhabdomyolysis  - admit CK >7500 -Resolved   Hypernatremia -Resolved  Hypotension -Resolved  Altered mental status/Neuroleptic Malignant Syndrome?/ -Patient was on antipsychotic Asenapine last dose 2/26.  Patient now has spiking fevers, catatonia, rigidity which would be consistent with NMS. -Discussed case with Dr. Amada Jupiter Neuro Hospitalist, and he agreed possible therefore they will see patient in A.m. -Continue amantadine 100 mg TID per neurology -Continue bromocriptine 2.5 mg QID -Patient continues to improve -3/11 spoke with psychiatry and they agree restarting antipsychotics would not be in patient's best interest at this time. See addendum;   Depression/Bipolar DO/Catatonia -Cymbalta 30 mg daily was discontinued  Staph Hominis -Completed course of antibiotics  Spiking fevers -Most likely secondary to neuroleptic malignant syndrome  -Resolved    Seizures  -No witnessed seizure since admission  Dysphagia -Patient currently on dysphagia 2 fluid thin, will request swallow reevaluation on 3/13  Agitation -Spoke with Dr.[name] agrees we should not restart antipsychotics and to continue with PRN benzodiazepine  -Decrease Clonazepam 0.25 mg TID, hope will help with patient waking up and ability to participate with PT.   ADMINISTRATION;  -in order for patient to not be in violation who is his court order please ensure one of the parents is notified of his transfer in order that father may contact DA and provide appropriate location. -Patient CAN NOT LEAVE state  -Would be in patient's best interest to  be discharged to INPATIENT BEHAVIORAL HEALTH, with NEURO REHABILITATION -Placed consult to social worker requesting they work on above    Code Status: FULL Family Communication: Mother present at  time of exam Disposition Plan: Inpatient psychiatric facility?/Jail    Consultants: Dr.[name] psychiatry Dr.Ram Fabiola Backer Nandigam neurology     Procedure/Significant Events: 2/19 Admit from jail with seizure, intubated  2/20 LP >> negative final  2/20 EEG  >> No active seizures.  Repeat EEG>>>no seizure activity 2/21 TEE; normal echocardiogram 2/22- focal seizures mouth but LOS associated 2/27 MRI brain wo contrast; normal  2/27 MRI T-spine/C-spine;  -Edema posterior paraspinal musculature  -minimal amount of retropharyngeal edema. -Nonspecific posterior paravertebral edema extends into the upper thoracic region -Multiple bulging disks with cord flattening     Culture 2/19 blood left  AC/wrist positive staph Hominis 2/19 was by PCR negative 2/19 blood right AC 2 negative final 2/19 HIV negative 2/21 LP  >>> negative final (VDRL, cryptococcal antigen, HSV 1/2) 2/22 blood right/left hand negative final 3/2 blood pending 3/2 urine pending   Antibiotics: Rocephin 2gm 2/19 >> 2/22 Vanco 2/20 >> 2/22 Cefazolin 2/22 >> 3/5   DVT prophylaxis: SCD   Devices NA   LINES / TUBES:  NA    Continuous Infusions: . dextrose 5 % and 0.45% NaCl Stopped (05/31/15 2300)  . feeding supplement (VITAL AF 1.2 CAL) 1,000 mL (06/04/15 0400)    Objective: VITAL SIGNS: Temp: 98.6 F (37 C) (03/12 1330) Temp Source: Axillary (03/12 1330) BP: 115/71 mmHg (03/12 1330) Pulse Rate: 98 (03/12 1330) SPO2; FIO2:   Intake/Output Summary (Last 24 hours) at 06/05/15 1731 Last data filed at 06/05/15 1536  Gross per 24 hour  Intake      0 ml  Output   3350 ml  Net  -3350 ml     Exam: General: Sleepy today, patient sitting in chair for 3+ hours. Did not arouse No acute respiratory distress Eyes: NA ENT: Negative Runny nose, negative gingival bleeding, Neck:  Negative scars, masses, torticollis, lymphadenopathy, JVD Lungs: Clear to auscultation  bilaterally without wheezes or crackles Cardiovascular: Regular rhythm and rate without murmur gallop or rub normal S1 and S2 Abdomen:negative abdominal pain, nondistended, positive soft, bowel sounds, no rebound, no ascites, no appreciable mass Extremities: No significant cyanosis, clubbing, or edema bilateral lower extremities Psychiatric:  Unable to obtain patient sleeping Neurologic: Unable to obtain patient sleeping   Data Reviewed: Basic Metabolic Panel:  Recent Labs Lab 05/30/15 0822 05/31/15 0510 06/02/15 0534  NA 147* 141 139  K 4.2 4.1 4.0  CL 108 105 102  CO2 GLUCOSE 141* 118* 110*  BUN 26* 25* 19  CREATININE 0.59* 0.55* 0.64  CALCIUM 8.9 8.8* 8.8*  MG 2.1  --   --   PHOS 4.4  --   --    Liver Function Tests:  Recent Labs Lab 06/02/15 0534  AST 29  ALT 40  ALKPHOS 57  BILITOT 0.7  PROT 5.7*  ALBUMIN 2.3*   No results for input(s): LIPASE, AMYLASE in the last 168 hours.  Recent Labs Lab 05/30/15 0822  AMMONIA 39*   CBC:  Recent Labs Lab 05/30/15 0822 06/02/15 0534  WBC 10.1 9.3  HGB 12.4* 12.3*  HCT 37.8* 36.2*  MCV 89.2 87.0  PLT 185 217   Cardiac Enzymes: No results for input(s): CKTOTAL, CKMB, CKMBINDEX, TROPONINI in the last 168 hours. BNP (last 3 results) No results for input(s): BNP in the last 8760  hours.  ProBNP (last 3 results) No results for input(s): PROBNP in the last 8760 hours.  CBG:  Recent Labs Lab 06/05/15 0019 06/05/15 0423 06/05/15 0906 06/05/15 1141 06/05/15 1646  GLUCAP 104* 91 99 102* 111*    Recent Results (from the past 240 hour(s))  Culture, blood (routine x 2)     Status: None   Collection Time: 05/26/15 10:34 PM  Result Value Ref Range Status   Specimen Description BLOOD LEFT HAND  Final   Special Requests BOTTLES DRAWN AEROBIC ONLY 5CC  Final   Culture NO GROWTH 5 DAYS  Final   Report Status 06/01/2015 FINAL  Final  Culture, blood (routine x 2)     Status: None   Collection Time:  05/26/15 10:44 PM  Result Value Ref Range Status   Specimen Description BLOOD RIGHT HAND  Final   Special Requests BOTTLES DRAWN AEROBIC AND ANAEROBIC 5CC   Final   Culture NO GROWTH 5 DAYS  Final   Report Status 06/01/2015 FINAL  Final  Culture, Urine     Status: None   Collection Time: 05/27/15  4:45 AM  Result Value Ref Range Status   Specimen Description URINE, CATHETERIZED  Final   Special Requests NONE  Final   Culture NO GROWTH 1 DAY  Final   Report Status 05/28/2015 FINAL  Final     Studies:  Recent x-ray studies have been reviewed in detail by the Attending Physician  Scheduled Meds:  Scheduled Meds: . amantadine  100 mg Per Tube TID  . antiseptic oral rinse  7 mL Mouth Rinse q12n4p  . baclofen  10 mg Oral TID  . bromocriptine  2.5 mg Per Tube QID  . chlorhexidine  15 mL Mouth Rinse BID  . clonazePAM  0.25 mg Per Tube QID  . enoxaparin (LOVENOX) injection  40 mg Subcutaneous Q24H  . feeding supplement (ENSURE ENLIVE)  237 mL Oral TID BM  . free water  300 mL Per Tube 4 times per day  . neomycin-bacitracin-polymyxin   Topical BID  . valproic acid  250 mg Oral 3 times per day    Time spent on care of this patient: 40 mins   WOODS, Roselind MessierURTIS J , MD  [facility] Office  [phone] Pager - [phone]  On-Call/Text Page:      [email or web address]      password [email or web address]  If 7PM-7AM, please contact night-coverage [email or web address] Password [email or web address] 06/05/2015, 5:31 PM   LOS: 21 days   Care during the described time interval was provided by me .  I have reviewed this patient's available data, including medical history, events of note, physical examination, and all test results as part of my evaluation. I have personally reviewed and interpreted all radiology studies.   Carolyne Littlesurtis Woods, MD [phone] Pager

## 2015-06-06 LAB — GLUCOSE, CAPILLARY
GLUCOSE-CAPILLARY: 102 mg/dL — AB (ref 65–99)
GLUCOSE-CAPILLARY: 121 mg/dL — AB (ref 65–99)
GLUCOSE-CAPILLARY: 85 mg/dL (ref 65–99)
Glucose-Capillary: 111 mg/dL — ABNORMAL HIGH (ref 65–99)
Glucose-Capillary: 111 mg/dL — ABNORMAL HIGH (ref 65–99)
Glucose-Capillary: 113 mg/dL — ABNORMAL HIGH (ref 65–99)
Glucose-Capillary: 113 mg/dL — ABNORMAL HIGH (ref 65–99)

## 2015-06-06 MED ORDER — VITAL AF 1.2 CAL PO LIQD
1000.0000 mL | ORAL | Status: DC
  Filled 2015-06-06: qty 1000

## 2015-06-06 MED ORDER — FREE WATER
200.0000 mL | Freq: Three times a day (TID) | Status: DC
  Administered 2015-06-06 – 2015-06-07 (×2): 200 mL

## 2015-06-06 NOTE — Progress Notes (Signed)
Physical Therapy Treatment Patient Details Name: Chase Hampton R XXXDillard MRN: [id number] DOB: 03/02/1991 Today's Date: 06/06/2015    History of Present Illness Pt is a 25 y/o M admitted from jail where he had multiple episodes of urinating on himself and physically aggressive behavior, including striking his own head on the wall.  Pt had another seizure in the ER and was ultimately intubated.  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  His PMH includes anxiety and depression.    PT Comments    Pt performed increased activity with addition of therapeutic exercise.  Pt fatigues easily and reports feeling hot post tx.  Pt used sara plus lift this tx, will try to progress pt to stedy next visit.    Follow Up Recommendations  Supervision/Assistance - 24 hour;CIR     Equipment Recommendations  Wheelchair (measurements PT);Wheelchair cushion (measurements PT)    Recommendations for Other Services       Precautions / Restrictions Precautions Precautions: Fall Precaution Comments: seizure precautions; watch HR and RR Restrictions Weight Bearing Restrictions: No    Mobility  Bed Mobility Overal bed mobility: Needs Assistance;+2 for physical assistance;+ 2 for safety/equipment Bed Mobility: Supine to Sit     Supine to sit: +2 for physical assistance;+2 for safety/equipment;HOB elevated;Mod assist Sit to supine: +2 for physical assistance;+2 for safety/equipment;Max assist   General bed mobility comments: Assist needed for all aspects of bed mobility w/ use of bed pad for positioning.  Pt able to initiate movement  Transfers Overall transfer level: Needs assistance               General transfer comment: Pt reaching out for grab bars on Sara Plus to maintain upright posture and to use Bil UE strength to assist w/ stand.  [place] DecSara Plus utilized to assist pt to stand and pivot to chair. Pt demonstrates improve initiation of quads.  Try steady next  visit to progress pt to stand.    Ambulation/Gait Ambulation/Gait assistance:  (remains unable.  )           General Gait Details: unable to attempt at this time for pt/therapist safety   Stairs            Wheelchair Mobility    Modified Rankin (Stroke Patients Only)       Balance     Sitting balance-Leahy Scale: Fair Sitting balance - Comments: Holding onto handles on Sara Plus pt is able to maintain upright sitting EOB for ~4 minutes                             Cognition Arousal/Alertness: Awake/alert Behavior During Therapy: Flat affect Overall Cognitive Status: Difficult to assess Area of Impairment: Attention;Memory;Following commands;Safety/judgement;Problem solving   Current Attention Level: Selective   Following Commands: Follows one step commands with increased time     Problem Solving: Difficulty sequencing General Comments: Pt follows all commands w/ assist as needed.  Pt more alert today and able to communicate w/ short sentences.    Exercises General Exercises - Lower Extremity Long Arc Quad: AROM;Both;Seated;10 reps;AAROM Hip ABduction/ADduction: AROM;Both;10 reps;AAROM;Seated Hip Flexion/Marching: AROM;Seated;AAROM;Both;10 reps    General Comments        Pertinent Vitals/Pain Pain Assessment: No/denies pain Faces Pain Scale: No hurt    Home Living                      Prior Function  PT Goals (current goals can now be found in the care plan section) Acute Rehab PT Goals Potential to Achieve Goals: Fair Progress towards PT goals: Progressing toward goals    Frequency  Min 3X/week    PT Plan Frequency needs to be updated;Discharge plan needs to be updated    Co-evaluation             End of Session Equipment Utilized During Treatment: Oxygen;Gait belt Activity Tolerance: Patient limited by fatigue Patient left: with family/visitor present;in chair;with call bell/phone within reach;with  chair alarm set     Time: 1096-0454 PT Time Calculation (min) (ACUTE ONLY): 37 min  Charges:  $Therapeutic Exercise: 8-22 mins $Therapeutic Activity: 8-22 mins                    G Codes:      Florestine Avers 2015/06/24, 1:00 PM  Joycelyn Rua, PTA pager [phone]

## 2015-06-06 NOTE — Progress Notes (Signed)
[place] TEAM 1 - Stepdown/ICU TEAM PROGRESS NOTE  Chase Hampton ZOX:[id number] DOB: [birth year] DOA: 05/15/2015 PCP: [name], MD  Admit HPI / Brief Narrative: 25 y/o Mw/ a Hx of Anxiety, Depression (prior [facility] hospitalizations) with recent admission to [facility] in [place] from 2/3 > 2/8 for major depressive disorder with homicidal / suicidal ideation after a break up with his fiancee. Due to restraining order violations, he was transferred from [facility] to [facility] jail were he had been on suicide watch. The patient reportedly refused to eat, drink or take his medications. He reportedly would only lay in the floor and urinate on himself. After multiple episodes of urinating on himself, he was asked to mop up the floor, which he was able to perform. Prior to discharge from [facility], notes reflect he had multiple episodes of physically aggressive behavior - including striking his own head on the seclusion room walls. At discharge he was to continue on Cymbalta, Depakote and Saphris. Prison staff report the patient refused all medications.   On 2/19, the patient was noted to have a seizure in jail. He was transported to [facility] ER via EMS. It is documented that he arrived to the ER with urine and feces all over him. There is question if patient was weaning off ativan?? Per [facility] notes but discharge note from [facility] does not include Rx for ativan. Room air saturations were 82%, he was treated with 100% NRB. He was treated with  IV versed per EMS en route. The patient had a second seizure in ER and was ultimately intubated. He was transferred to [facility] ICU for further evaluation.  The patient arrived to [facility] on propofol, mechanically intubated.   Significant Events: 2/19 Admit from jail with seizure, intubated  2/20 LP > negative for infection  2/20 EEG > No active seizures.  2/21 Repeat EEG > no seizure activity 2/21 TEE normal  2/22 focal seizures mouth 2/27 MRI brain wo contrast  normal 2/27 MRI T-spine/C-spine - Edema posterior paraspinal musculature - minimal amount of retropharyngeal edema - Nonspecific posterior paravertebral edema extends into the upper thoracic region - Multiple bulging disks with cord flattening   HPI/Subjective: The patient is sleeping in the room but with verbal commands he will open his eyes and interact with the examiner.  It does require significant encouragement from the examiner to get the patient to answer questions.  I have had an extensive discussion with his mother and father at the bedside today.  Assessment/Plan:  FUO (103.8 05/26/15) - Altered mental status - ?NMS -NMS?- no evidence of infection  -Neuro following - bromocriptine and amantadine continue  -No longer requiring a cooling blanket -avoid serotonin active agents or antipsychotics   Staph hominis bacteremia 2/2 + blood cultures upon admission to [facility] ED 05/15/2015 - TEE negative and LP benign - felt to be a genuine bacteremia per ID - ID recommended a 2 week course of tx w/ IV abx (through May 29, 2015) - ID signed off 2/23 - now off abx - obtain follow-up/surveillance blood cultures in a.m.  Acute Hypoxic Respiratory Failure - Resolved  -now on RA w/ sats in upper 90s  Hypernatremia  -due to hypovolemia - resolved    Hypotension  -resolved  Rhabdomyolysis - resolved  - admit CK >7500 - resolved in f/u at 71  Bipolar D/O w/ Depression > Catatonia  -care as per Psych recommendations  Nutrition  Cont tube feeds at lower dose now that SLP has cleared for diet -  continue low dose feeds until intake sufficient to support his needs/it is clear he will take meds by mouth   Code Status: FULL Family Communication: Spoke with father and mother at bedside Disposition Plan: PT/OT continue - appears will need prolonged PT/OT course, perhaps in SNF type setting - wether he will require inpt Psych tx to be determined by Psych consult    Consultants: Psychiatry PCCM Neurology  ID  Antibiotics: Rocephin 2/19 > 2/22 Vanco 2/20 > 2/22 Cefazolin 2/22 > 3/5  DVT prophylaxis: lovenox   Objective: Blood pressure 106/57, pulse 97, temperature 98.2 F (36.8 C), temperature source Oral, resp. rate 17, height 5\' 9"  (1.753 m), weight 116.9 kg (257 lb 11.5 oz), SpO2 95 %.  Intake/Output Summary (Last 24 hours) at 06/06/15 1514 Last data filed at 06/06/15 0530  Gross per 24 hour  Intake      0 ml  Output   2540 ml  Net  -2540 ml   Exam: General: No acute respiratory distress - more interactive today  Lungs: Clear to auscultation bilaterally - no wheeze  Cardiovascular: RRR - no gallup or rub  Abdomen: nondistended, soft, bowel sounds positive, no rebound, no ascites Extremities: no significant cyanosis, or clubbing - trace edema bilateral lower extremities  Data Reviewed:  Basic Metabolic Panel:  Recent Labs Lab 05/31/15 0510 06/02/15 0534  NA 141 139  K 4.1 4.0  CL 105 102  CO2 27 28  GLUCOSE 118* 110*  BUN 25* 19  CREATININE 0.55* 0.64  CALCIUM 8.8* 8.8*    CBC:  Recent Labs Lab 06/02/15 0534  WBC 9.3  HGB 12.3*  HCT 36.2*  MCV 87.0  PLT 217    Liver Function Tests:  Recent Labs Lab 06/02/15 0534  AST 29  ALT 40  ALKPHOS 57  BILITOT 0.7  PROT 5.7*  ALBUMIN 2.3*   CBG:  Recent Labs Lab 06/05/15 2024 06/06/15 0005 06/06/15 0408 06/06/15 0849 06/06/15 1150  GLUCAP 96 113* 111* 111* 102*   Studies:   Recent x-ray studies have been reviewed in detail by the Attending Physician  Scheduled Meds:  Scheduled Meds: . amantadine  100 mg Per Tube TID  . antiseptic oral rinse  7 mL Mouth Rinse q12n4p  . baclofen  10 mg Oral TID  . bromocriptine  2.5 mg Per Tube QID  . chlorhexidine  15 mL Mouth Rinse BID  . clonazePAM  0.25 mg Per Tube TID  . enoxaparin (LOVENOX) injection  40 mg Subcutaneous Q24H  . feeding supplement (ENSURE ENLIVE)  237 mL Oral TID BM  . free water   300 mL Per Tube 4 times per day  . neomycin-bacitracin-polymyxin   Topical BID  . valproic acid  250 mg Oral 3 times per day    Time spent on care of this patient: 35 mins   Wandalee Klang T , MD   [facility] Office  [phone] Pager - Text Page per Loretha StaplerAmion as per below:  On-Call/Text Page:      [email or web address]      password [email or web address]  If 7PM-7AM, please contact night-coverage [email or web address] Password [email or web address] 06/06/2015, 3:14 PM   LOS: 22 days

## 2015-06-06 NOTE — Progress Notes (Signed)
Speech Language Pathology Treatment: Dysphagia  Patient Details Name: Chase Hampton MRN: [id number] DOB: 11/12/1990 Today's Date: 06/06/2015 Time: [id number] SLP Time Calculation (min) (ACUTE ONLY): 12 min  Assessment / Plan / Recommendation Clinical Impression  SLP provided advanced trials of regular textures and thin liquids. While no overt s/s of aspiration were observed, he has mildly prolonged mastication with small amounts of residue that remain without awareness. Mod cues provided to assist with clearance. Pt takes large, consecutive straw sips which are followed by rapid breathing, which does not appear to be a result of aspiration (although this cannot be ruled out completely at bedside), but could certainly increase his risk. Total A faded to Mod cues provided for smaller sips that alleviated changes in respirations. Recommend advancement to Dys 3 diet and thin liquids with full supervision for pacing due to current mentation.    HPI HPI: Pt with hx of GERD, [facility]. [facility] [place] [facility] where he refused to eat/drink or take meds. Pt noted to have seizure and [facility] another seizure and was intubated 2/19-2/28. Pt has remained lethargic since then. CT spine shows Edema posterior paraspinal musculature - minimal amount of retropharyngeal edema.       SLP Plan  Continue with current plan of care     Recommendations  Diet recommendations: Dysphagia 3 (mechanical soft);Thin liquid Liquids provided via: Cup;Straw Medication Administration: Whole meds with puree Supervision: Patient able to self feed;Full supervision/cueing for compensatory strategies Compensations: Follow solids with liquid;Small sips/bites;Slow rate;Lingual sweep for clearance of pocketing Postural Changes and/or Swallow Maneuvers: Seated upright 90 degrees             Oral Care Recommendations: Oral care BID Follow up  Recommendations: Inpatient Rehab Plan: Continue with current plan of care     GO               Chase Hampton, M.A. CCC-SLP [phone]  Chase Hamaiewonsky, Chase Hampton 06/06/2015, 4:09 PM

## 2015-06-06 NOTE — Consult Note (Signed)
Chase Hampton  Reason for Consult:  Major depression, TBI, noncompliant while in detention, benzo withdrawal seizures and NMS Referring Physician:  Dr. Titus Mould Chase Hampton Identification: Chase Hampton MRN:  [id number] Principal Diagnosis: Neuroleptic malignant syndrome Diagnosis:   Chase Hampton Active Problem List   Diagnosis Date Noted  . Hypotension [I95.9]   . Weakness [R53.1]   . Rigidity [R29.898]   . Muscle spasm [M62.838]   . Acute respiratory failure with hypoxia and hypercapnia (HCC) [J96.01, J96.02]   . Encephalopathy acute [G93.40]   . Catatonia ([place]) [F06.1]   . FUO (fever of unknown origin) [R50.9]   . Depression [F32.9]   . Neuroleptic malignant syndrome [G21.0]   . Seizure ([place]) [R56.9]   . Encounter for orogastric (OG) tube placement [Z46.59]   . Acute respiratory failure ([place]) [J96.00]   . Coagulase negative Staphylococcus bacteremia [R78.81]   . Bipolar I disorder, most recent episode depressed ([place]) [F31.30]   . Suicidal ideation [R45.851]   . Homicidal ideation [R45.850]   . 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 Acute encephalopathy [G93.40]   . Seizures ([place]) [R56.9]   . Status epilepticus ([place]) [G40.901] 05/15/2015  . Major depressive disorder, recurrent episode, moderate ([place]) [F33.1] 04/30/2015    Total Time spent with Chase Hampton: 30 minutes  Subjective:   Chase Hampton is a 25 y.o. male Chase Hampton admitted with status post seizures and depression.  HPI:   Chase Hampton is a 25 years old single male admitted to [facility] medical intensive care unit for status post benzodiazepine withdrawal seizures and increased symptoms of bipolar depression, anxiety, anger outbursts and bizarre behaviors. Reviewed neurology consultation and their follow-ups regarding seizures and possible catatonia. Chase Hampton is also has multiple legal charges regarding violation of restraining order and multiple acute psychiatric hospitalization since  November 2016 for depression, anger outbursts, suicidal and homicidal ideation, intention and plans. Review of medical records indicated he was hospitalized at least 3 times at [facility] both voluntarily and involuntarily and also in [place]. Chase Hampton father reported that he has a severe allergic reaction to Haldol given during the second hospitalization. Chase Hampton [facility] and then transferred to [facility] for continuous EEG monitoring. Chase Hampton mother, father and sister were in the hospital and provided detailed history of deterioration of mental status and noncompliant with the medication management and withdrawal symptoms. Chase Hampton also has history of traumatic brain injury, concussion injury while playing football and shoulder injury while playing rugby.   Past Psychiatric History: no prior psychiatric history before Nov 2016 (no suicidal attempts, never prescribed with any psychotropics).   1st Hospitalization in our unit from 02/23/15 to 03/01/15. "presented to the emergency department voicing worsening depression, thoughts of suicide and some homicidal ideation towards his girlfriend. These worsening of symptoms was triggered after his girlfriend of 5 years broke up with him. Chase Hampton reports a long history of anger issues that started in childhood. The Chase Hampton also has history of multiple concussions due to playing football. Per reports from the Chase Hampton's family he was holding a gun and threatening to kill himself and her." He was diagnosed with depression and was started on fluoxetine. 2nd Hospitalization in our unit: 03/07/15 to 03/14/15: "presented to our ER on 67/54 under police custody. According to report he got into an altercation with his father and then stated he was getting in his car to go kill his fiance and her family which lives very close by. Police  were called. The Chase Hampton was belligerent and violent."  Diagnosed with MDD and d/c on fluoxetine and depakote for irritability and anger. 3rd Hospitalization in [place]: no records available: jump out of his parents car and tried to tie a cord around his neck. Per pt, prozac was increased to 40 mg. Depakote was d/c. After his second hospitalization he f/u with [facility] in [place] where saphris was added to prozac and depakote. He also f/o with [facility] for therapy.  Interval history: Chase Hampton seen for psychiatric Hampton and briefly spoke with the Chase Hampton and case discussed with the staff RN and Chase Hampton mother. Recommended no antipsychotic medication due to recent neuroleptic malignant syndrome. His past medication was Saphris and it is not clearly he ever received Haldol while in the jail. May use benzodiazepines Ativan as needed for anxiety and restlessness and insomnia. Chase Hampton mother stated that he has temper tantrum and yelling at his mother and asking to take him home. She is emotional when talking about his behaviors. She also aware of his intermittent explosive disorder and TBI from head injury during school sports. Chase Hampton is able to talk to me few more minutes than the other day and asked to go home because he is missing his dog. He also seems to able to understand the needs of being in hospital at this time. He slept off while talking with his mother. He also aware of legal restriction regarding going home and needs of supervision of medical facility and medical personal.   Reportedly he complaint off body pain while sitting in the chair more and comfortable in his bed. He cannot function and has no ADL's at current physical condition and his family cannot provide medical care at home. Psychiatric consultation will continue to Hampton and will provide appropriate recommendations when he is medically stable.   Risk to Self: Is Chase Hampton at risk for suicide?: No Risk to Others:   Prior Inpatient Therapy:   Prior Outpatient Therapy:     Past Medical History:  Past Medical History  Diagnosis Date  . GERD (gastroesophageal reflux disease)   . Anxiety   . Depression     Past Surgical History  Procedure Laterality Date  . No past surgeries     Family History:  Family History  Problem Relation Age of Onset  . Heart disease Maternal Grandfather   . Diabetes Maternal Grandfather   . Heart disease Paternal Grandmother    Family Psychiatric  History: Family history of depression.  Social History:  History  Alcohol Use No     History  Drug Use  . Yes  . Special: Marijuana    Social History   Social History  . Marital Status: Single    Spouse Name: N/A  . Number of Children: N/A  . Years of Education: N/A   Social History Main Topics  . Smoking status: Former Smoker    Types: Cigars  . Smokeless tobacco: Never Used  . Alcohol Use: No  . Drug Use: Yes    Special: Marijuana  . Sexual Activity: No   Other Topics Concern  . None   Social History Narrative   Additional Social History:    Allergies:   Allergies  Allergen Reactions  . Haldol [Haloperidol Lactate] Other (See Comments)    Angioedema [facility] lists no allergies as of 05/15/15)    Labs:  Results for orders placed or performed during the hospital encounter of 05/15/15 (from the past 48 hour(s))  Glucose, capillary  Status: Abnormal   Collection Time: 06/04/15  4:31 PM  Result Value Ref Range   Glucose-Capillary 109 (H) 65 - 99 mg/dL   Comment 1 Notify RN    Comment 2 Document in Chart   Glucose, capillary     Status: Abnormal   Collection Time: 06/04/15  8:11 PM  Result Value Ref Range   Glucose-Capillary 108 (H) 65 - 99 mg/dL   Comment 1 Notify RN    Comment 2 Document in Chart   Glucose, capillary     Status: Abnormal   Collection Time: 06/05/15 12:19 AM  Result Value Ref Range   Glucose-Capillary 104 (H) 65 - 99 mg/dL   Comment 1 Notify RN    Comment 2 Document in Chart   Glucose, capillary     Status:  None   Collection Time: 06/05/15  4:23 AM  Result Value Ref Range   Glucose-Capillary 91 65 - 99 mg/dL   Comment 1 Notify RN    Comment 2 Document in Chart   Glucose, capillary     Status: None   Collection Time: 06/05/15  9:06 AM  Result Value Ref Range   Glucose-Capillary 99 65 - 99 mg/dL  Glucose, capillary     Status: Abnormal   Collection Time: 06/05/15 11:41 AM  Result Value Ref Range   Glucose-Capillary 102 (H) 65 - 99 mg/dL   Comment 1 Notify RN    Comment 2 Document in Chart   Glucose, capillary     Status: Abnormal   Collection Time: 06/05/15  4:46 PM  Result Value Ref Range   Glucose-Capillary 111 (H) 65 - 99 mg/dL   Comment 1 Notify RN    Comment 2 Document in Chart   Glucose, capillary     Status: None   Collection Time: 06/05/15  8:24 PM  Result Value Ref Range   Glucose-Capillary 96 65 - 99 mg/dL   Comment 1 Notify RN    Comment 2 Document in Chart   Glucose, capillary     Status: Abnormal   Collection Time: 06/06/15 12:05 AM  Result Value Ref Range   Glucose-Capillary 113 (H) 65 - 99 mg/dL   Comment 1 Notify RN    Comment 2 Document in Chart   Glucose, capillary     Status: Abnormal   Collection Time: 06/06/15  4:08 AM  Result Value Ref Range   Glucose-Capillary 111 (H) 65 - 99 mg/dL   Comment 1 Notify RN    Comment 2 Document in Chart   Glucose, capillary     Status: Abnormal   Collection Time: 06/06/15  8:49 AM  Result Value Ref Range   Glucose-Capillary 111 (H) 65 - 99 mg/dL   Comment 1 Notify RN    Comment 2 Document in Chart   Glucose, capillary     Status: Abnormal   Collection Time: 06/06/15 11:50 AM  Result Value Ref Range   Glucose-Capillary 102 (H) 65 - 99 mg/dL   Comment 1 Notify RN    Comment 2 Document in Chart     Current Facility-Administered Medications  Medication Dose Route Frequency Provider Last Rate Last Dose  . acetaminophen (TYLENOL) solution 500 mg  500 mg Per Tube Q4H PRN Lonia Blood, MD   500 mg at 06/03/15 2232   . amantadine (SYMMETREL) solution 100 mg  100 mg Per Tube TID Ram Daniel Nones, MD   100 mg at 06/06/15 1006  . antiseptic oral rinse (CPC / CETYLPYRIDINIUM CHLORIDE  0.05%) solution 7 mL  7 mL Mouth Rinse q12n4p Drema Dallasurtis J Woods, MD   7 mL at 06/06/15 1246  . baclofen (LIORESAL) tablet 10 mg  10 mg Oral TID Ram Daniel NonesNarayan Kaveer Nandigam, MD   10 mg at 06/06/15 1005  . bromocriptine (PARLODEL) tablet 2.5 mg  2.5 mg Per Tube QID Lonia BloodJeffrey T McClung, MD   2.5 mg at 06/06/15 1005  . chlorhexidine (PERIDEX) 0.12 % solution 15 mL  15 mL Mouth Rinse BID Drema Dallasurtis J Woods, MD   15 mL at 06/06/15 1005  . clonazePAM (KLONOPIN) tablet 0.25 mg  0.25 mg Per Tube TID Drema Dallasurtis J Woods, MD   0.25 mg at 06/06/15 1006  . dextrose 5 %-0.45 % sodium chloride infusion   Intravenous Continuous Leslye Peerobert S Byrum, MD   Stopped at 05/31/15 2300  . diphenhydrAMINE (BENADRYL) 12.5 MG/5ML elixir 12.5 mg  12.5 mg Per Tube Q6H PRN Jinger NeighborsMary A Lynch, NP      . enoxaparin (LOVENOX) injection 40 mg  40 mg Subcutaneous Q24H Lonia BloodJeffrey T McClung, MD   40 mg at 06/05/15 2031  . feeding supplement (ENSURE ENLIVE) (ENSURE ENLIVE) liquid 237 mL  237 mL Oral TID BM Drema Dallasurtis J Woods, MD   237 mL at 06/05/15 2032  . feeding supplement (VITAL AF 1.2 CAL) liquid 1,000 mL  1,000 mL Per Tube Continuous Alyson ReedyWesam G Yacoub, MD 80 mL/hr at 06/06/15 0011 1,000 mL at 06/06/15 0011  . free water 300 mL  300 mL Per Tube 4 times per day Lonia BloodJeffrey T McClung, MD   300 mL at 06/06/15 1246  . LORazepam (ATIVAN) tablet 1-2 mg  1-2 mg Oral Q4H PRN Drema Dallasurtis J Woods, MD      . neomycin-bacitracin-polymyxin (NEOSPORIN) ointment   Topical BID Oretha Milchakesh Alva V, MD      . ondansetron [facility](ZOFRAN) tablet 4 mg  4 mg Oral Q6H PRN Drema Dallasurtis J Woods, MD      . valproic acid (DEPAKENE) 250 MG/5ML syrup 250 mg  250 mg Oral 3 times per day Lonia BloodJeffrey T McClung, MD   250 mg at 06/06/15 0615    Musculoskeletal: Strength & Muscle Tone: decreased Gait & Station: unable to stand Chase Hampton leans:  N/A  Psychiatric Specialty Exam: Review of Systems  Unable to perform ROS   Blood pressure 103/56, pulse 100, temperature 98.5 F (36.9 C), temperature source Axillary, resp. rate 19, height 5\' 9"  (1.753 m), weight 116.9 kg (257 lb 11.5 oz), SpO2 97 %.Body mass index is 38.04 kg/(m^2).  General Appearance: Disheveled and Guarded  Patent attorneyye Contact::  Fair  Speech:  Clear and Coherent and Slow  Volume:  Decreased  Mood:  Anxious and Depressed  Affect:  Constricted and Depressed  Thought Process:  Coherent and Goal Directed  Orientation:  Full (Time, Place, and Person)  Thought Content:  Rumination  Suicidal Thoughts:  No  Homicidal Thoughts:  No  Memory:  Immediate;   Fair Recent;   Fair  Judgement:  Fair  Insight:  Shallow  Psychomotor Activity:  Psychomotor Retardation  Concentration:  Fair  Recall:  [name]  Fund of Knowledge:Fair  Language: Fair  Akathisia:  Negative  Handed:  Right  AIMS (if indicated):     Assets:  Communication Skills Desire for Improvement Financial Resources/Insurance Housing Leisure Time Resilience Social Support Talents/Skills Transportation  ADL's:  Impaired  Cognition: Impaired,  Severe  Sleep:      Treatment Plan Summary:  Chase Hampton admitted with major depressive disorder, intermittent explosive disorder, TBI,  status post seizures, status post intubation/extubation and recent history of multiple acute psychiatric hospitalization at [facility] and also [place].   Reportedly noncompliant with medications while being in detention center/Jail   Chase Hampton has history of multiple episodes of uncontrollable anger outbursts, intermittent explosive behaviors, TBI, suicidal and homicidal threats in the past.    Recommended no psychotropic medication management due to diagnosis of neuroleptic malignant syndrome. Chase Hampton was not able to eat or drink and has occasional agitation, aggression like throwing meal tray which he called temper  tantrums  Recommend benzodiazepines like Ativan 1-2 mg every 4 hours as needed for excessive anxiety and agitation   Appreciate psychiatric consultation and follow up as clinically required Please contact [phone] or [phone] if needs further assistance  Disposition: Chase Hampton benefit from PT and OT and possibly needed inpatient rehabilitation services when medically stable.    Nehemiah Settle., MD 06/06/2015 2:15 PM

## 2015-06-07 LAB — GLUCOSE, CAPILLARY
GLUCOSE-CAPILLARY: 121 mg/dL — AB (ref 65–99)
Glucose-Capillary: 70 mg/dL (ref 65–99)
Glucose-Capillary: 76 mg/dL (ref 65–99)
Glucose-Capillary: 85 mg/dL (ref 65–99)
Glucose-Capillary: 93 mg/dL (ref 65–99)
Glucose-Capillary: 99 mg/dL (ref 65–99)

## 2015-06-07 MED ORDER — AMANTADINE HCL 100 MG PO CAPS
100.0000 mg | ORAL_CAPSULE | Freq: Three times a day (TID) | ORAL | Status: DC
  Administered 2015-06-07 – 2015-06-14 (×20): 100 mg via ORAL
  Filled 2015-06-07 (×29): qty 1

## 2015-06-07 NOTE — Care Management Note (Signed)
Case Management Note  Patient Details  Name: Chase GivensStewart R XXXDillard MRN: [id number] Date of Birth: 05/31/1990  Subjective/Objective:                    Action/Plan: Plan is for SNF placement at d/c for rehab. CM continuing to follow for d/c needs.   Expected Discharge Date:                  Expected Discharge Plan:  Psychiatric Hospital  In-House Referral:  Clinical Social Work  Discharge planning Services  CM Consult  Post Acute Care Choice:    Choice offered to:     DME Arranged:    DME Agency:     HH Arranged:    HH Agency:     Status of Service:  Completed, signed off  Medicare Important Message Given:    Date Medicare IM Given:    Medicare IM give by:    Date Additional Medicare IM Given:    Additional Medicare Important Message give by:     If discussed at Long Length of Stay Meetings, dates discussed:    Additional Comments:  Kermit BaloKelli F Diamonds Lippard, RN 06/07/2015, 2:39 PM

## 2015-06-07 NOTE — Clinical Social Work Note (Signed)
Naz[place] MUST PASARR submitted: H/P, FL-2 and 30 day note faxed.   Derenda FennelBashira Allayah Raineri, MSW, LCSWA [phone] 06/07/2015 5:56 PM

## 2015-06-07 NOTE — Progress Notes (Signed)
[facility] PROGRESS NOTE  TYRELL SEIFER AVW:[id number] DOB: 10/11/1990 DOA: 05/15/2015 PCP: [name], MD  Brief narrative 25 y/o Mw/ a Hx of Anxiety, Depression (prior [facility] hospitalizations) with recent admission to [facility] in [place] from 2/3 > 2/8 for major depressive disorder with homicidal / suicidal ideation after a break up with his fiancee. Due to restraining order violations, he was transferred from [facility] to [facility] jail were he had been on suicide watch. The patient reportedly refused to eat, drink or take his medications. He reportedly would only lay in the floor and urinate on himself. After multiple episodes of urinating on himself, he was asked to mop up the floor, which he was able to perform. Prior to discharge from [facility], notes reflect he had multiple episodes of physically aggressive behavior - including striking his own head on the seclusion room walls. At discharge he was to continue on Cymbalta, Depakote and Saphris. Prison staff report the patient refused all medications.   On 2/19, the patient was noted to have a seizure in jail. He was transported to [facility] ER via EMS. It is documented that he arrived to the ER with urine and feces all over him. There is question if patient was weaning off ativan?? Per [facility] notes but discharge note from [facility] does not include Rx for ativan. Room air saturations were 82%, he was treated with 100% NRB. He was treated with  IV versed per EMS en route. The patient had a second seizure in ER and was ultimately intubated. He was transferred to [facility] ICU for further evaluation. The patient arrived to [facility] on propofol, mechanically intubated.   Significant hospital course: 2/19 Admit from jail with seizure, intubated  2/20 LP > negative for infection  2/20 EEG > No active seizures.  2/21 Repeat EEG > no seizure activity 2/21 TEE normal  2/22 focal seizures mouth 2/27 MRI brain wo contrast normal 2/27 MRI T-spine/C-spine  - Edema posterior paraspinal musculature - minimal amount of retropharyngeal edema - Nonspecific posterior paravertebral edema extends into the upper thoracic region - Multiple bulging disks with cord flattening  -3/14: NG removed, referral sent out for SNF placement  Assessment/Plan: FUO (103.8 05/26/15)/ Altered mental status / ?NMS -NMS?- no evidence of infection  -Neurology was following patient was in the hospital. Continued on bromocriptine and amantadine. -No longer requiring a cooling blanket -avoid serotonin active agents or antipsychotics . -Mentation has improved but his family and patient answering questions appropriately but he is mostly somnolent and still not getting out of bed on his own. He is able to move his extremities . -Will need ongoing PT  Staph hominis bacteremia 2/2 + blood cultures upon admission to [facility] ED 05/15/2015  - TEE negative and LP benign - felt to be a genuine bacteremia per ID  Completed 2 weeks course of IV antibiotics on 05/29/2015, as per recommendations. -Blood culture repeated today to ensure clearance.  Acute Hypoxic Respiratory Failure  - Resolved   Hypernatremia  -due to hypovolemia - resolved   Hypotension  -resolved  Rhabdomyolysis\ Achy on admission >7500. Received IV hydration. Now resolved.  Bipolar D/O w/ Depression > Catatonia  -Appreciate psychiatry recommendations. Unsuspected NMS recommend to avoid antipsychotics. On scheduled low-dose Klonopin and when necessary Ativan for agitation. Continue Depakote.   DVT prophylaxis: subontinuous lovenox   Diet: On NG tube feeds. SLP has cleared for dysphagia level III diet. NG removed today. Encouraged by mouth intake.  Code Status: FULL code Family Communication: Parents  at bedside Disposition Plan: Needs skilled nursing facility. Will need outpatient psychiatry referral.  Consultants: Psychiatry PCCM Neurology  ID  Antibiotics: Rocephin 2/19 > 2/22 Vanco 2/20 >  2/22 Cefazolin 2/22 > 3/5   HPI/Subjective: Seen and examined. Patient somnolent but arousable. Has not been agitated overnight.  Objective: Filed Vitals:   06/07/15 0858 06/07/15 1359  BP: 108/63 118/70  Pulse: 82 106  Temp: 97.7 F (36.5 C) 99.1 F (37.3 C)  Resp: 20 20    Intake/Output Summary (Last 24 hours) at 06/07/15 1454 Last data filed at 06/07/15 0600  Gross per 24 hour  Intake    636 ml  Output   2600 ml  Net  -1964 ml   Filed Weights   06/05/15 0500 06/06/15 0423 06/07/15 0504  Weight: 116.7 kg (257 lb 4.4 oz) 116.9 kg (257 lb 11.5 oz) 117.9 kg (259 lb 14.8 oz)    Exam:   General: Young obese male lying in bed somnolent, is able and answering questions appropriately but uninterested in  Discussed  HEENT: No pallor, moist mucosa, NG tube in place  Cardiovascular: Normal S1 and S2, no murmurs rub or gallop  Respiratory: Clear bilaterally, no added sounds  Abdomen: Soft, nondistended, nontender, bowel sounds present  Musculoskeletal: Warm, no edema  CNS: Somnolent but arousable and answering questions appropriately but very difficult to indulge in conversation. Moves all his extremities to some extent.  Data Reviewed: Basic Metabolic Panel:  Recent Labs Lab 06/02/15 0534  NA 139  K 4.0  CL 102  CO2 28  GLUCOSE 110*  BUN 19  CREATININE 0.64  CALCIUM 8.8*   Liver Function Tests:  Recent Labs Lab 06/02/15 0534  AST 29  ALT 40  ALKPHOS 57  BILITOT 0.7  PROT 5.7*  ALBUMIN 2.3*   No results for input(s): LIPASE, AMYLASE in the last 168 hours. No results for input(s): AMMONIA in the last 168 hours. CBC:  Recent Labs Lab 06/02/15 0534  WBC 9.3  HGB 12.3*  HCT 36.2*  MCV 87.0  PLT 217   Cardiac Enzymes: No results for input(s): CKTOTAL, CKMB, CKMBINDEX, TROPONINI in the last 168 hours. BNP (last 3 results) No results for input(s): BNP in the last 8760 hours.  ProBNP (last 3 results) No results for input(s): PROBNP in the  last 8760 hours.  CBG:  Recent Labs Lab 06/06/15 2028 06/06/15 2353 06/07/15 0446 06/07/15 0852 06/07/15 1134  GLUCAP 85 113* 99 93 121*    Recent Results (from the past 240 hour(s))  Culture, blood (Routine X 2) w Reflex to ID Panel     Status: None (Preliminary result)   Collection Time: 06/07/15  7:10 AM  Result Value Ref Range Status   Specimen Description BLOOD LEFT HAND  Final   Special Requests IN PEDIATRIC BOTTLE 3CC  Final   Culture PENDING  Incomplete   Report Status PENDING  Incomplete     Studies: No results found.  Scheduled Meds: . amantadine  100 mg Oral TID  . antiseptic oral rinse  7 mL Mouth Rinse q12n4p  . baclofen  10 mg Oral TID  . bromocriptine  2.5 mg Per Tube QID  . chlorhexidine  15 mL Mouth Rinse BID  . clonazePAM  0.25 mg Per Tube TID  . enoxaparin (LOVENOX) injection  40 mg Subcutaneous Q24H  . feeding supplement (ENSURE ENLIVE)  237 mL Oral TID BM  . neomycin-bacitracin-polymyxin   Topical BID  . valproic acid  250 mg Oral 3  times per day   Continuous Infusions:      Time spent:25 minutes    Eddie North  [facility] Pager [phone] If 7PM-7AM, please contact night-coverage at [email or web address], password [facility] 06/07/2015, 2:54 PM  LOS: 23 days

## 2015-06-07 NOTE — Clinical Social Work Note (Signed)
Clinical Social Worker met with patient and pt's parents present at bedside in regards to discharge planning. Patient sleeping during meeting. CSW introduced CSW role and SNF process. Pt's mother indicated that she was not sure if [facility]) would allow patient to discharge to SNF. Pt's mother also stated that patient was admitted while in [facility] custody. Pt's parents has since had the opportunity to bail patient out. Currently patient is released from [facility] custody on bond. CSW contacted [facility] security and confirmed that patient is free from police custody.   Pt's parents expressed several concerns in regards to discharge planning stating that pt should be d/c'ed to inpatient psych facility however acknowledged that patient must be medically stable to d/c to psych facility. Pt's parents requesting to meet with Attending, Dhungel and Psych MD, Jonnalagadda to determine best discharge setting. CSW reviewed PT and Psych recommendations stating that patient will likely require short-term rehab placement. Parents continued to request to meet with physicians. Both MD's notified.   CSW explained potential barriers to placement: psychiatric involvement and criminal background. Parents did not express must understanding and stated that patient was not convicted of anything.   CSW further reviewed SNF list and encouraged pt's parents to research and visit desired facilities. Pt's parents agreed to SNF and continued to express concerns of [facility] and not being aware patient was in critical condition until hospital made them aware. CSW processed and validated feelings.   FL-2 completed and faxed to all counties.  CSW encouraged pt's parents to contact CSW as needed. CSW will continue to follow pt and pt's family for continued support and to facilitate pt's discharge needs once medically stable.   Glendon Axe, MSW, LCSWA [phone] 06/07/2015 12:09 PM

## 2015-06-07 NOTE — NC FL2 (Signed)
[place] MEDICAID FL2 LEVEL OF CARE SCREENING TOOL     IDENTIFICATION  Patient Name: Chase Hampton Birthdate: 07/03/1990 Sex: male Admission Date (Current Location): 05/15/2015  [facility] [place]   Facility and Address:  [facility], [address], [place], [place] [address]      Provider Number: [id number]  Attending Physician Name and Address:  Eddie NorthNishant Dhungel, MD  Relative Name and Phone Number:       Current Level of Care: Hospital Recommended Level of Care: Skilled Nursing Facility Prior Approval Number:    Date Approved/Denied:   PASRR Number:    Discharge Plan: SNF    Current Diagnoses: Patient Active Problem List   Diagnosis Date Noted  . Hypotension   . Weakness   . Rigidity   . Muscle spasm   . Acute respiratory failure with hypoxia and hypercapnia (HCC)   . Encephalopathy acute   . Catatonia (HCC)   . FUO (fever of unknown origin)   . Depression   . Neuroleptic malignant syndrome   . Seizure (HCC)   . Encounter for orogastric (OG) tube placement   . Acute respiratory failure (HCC)   . Coagulase negative Staphylococcus bacteremia   . Bipolar I disorder, most recent episode depressed (HCC)   . Suicidal ideation   . Homicidal ideation   . Impulse control disease   . Acute encephalopathy   . Seizures (HCC)   . Status epilepticus (HCC) 05/15/2015  . Major depressive disorder, recurrent episode, moderate (HCC) 04/30/2015    Orientation RESPIRATION BLADDER Height & Weight     Self, Place, Situation, Time  Normal Incontinent Weight: 259 lb 14.8 oz (117.9 kg) Height:  5\' 9"  (175.3 cm)  BEHAVIORAL SYMPTOMS/MOOD NEUROLOGICAL BOWEL NUTRITION STATUS   (NONE ) Convulsions/Seizures Continent Diet (DYS 3 )  AMBULATORY STATUS COMMUNICATION OF NEEDS Skin   Extensive Assist Verbally Bruising (Left Thigh Posterior )                       Personal Care Assistance Level of Assistance  Bathing,  Feeding, Dressing Bathing Assistance: Maximum assistance Feeding assistance: Maximum assistance Dressing Assistance: Maximum assistance     Functional Limitations Info  Sight, Hearing, Speech Sight Info: Adequate Hearing Info: Adequate Speech Info: Impaired    SPECIAL CARE FACTORS FREQUENCY  PT (By licensed PT)     PT Frequency: 3              Contractures      Additional Factors Info  Code Status, Allergies Code Status Info: FULL CODE  Allergies Info: Haldol           Current Medications (06/07/2015):  This is the current hospital active medication list Current Facility-Administered Medications  Medication Dose Route Frequency Provider Last Rate Last Dose  . acetaminophen (TYLENOL) solution 500 mg  500 mg Per Tube Q4H PRN Lonia BloodJeffrey T McClung, MD   500 mg at 06/03/15 2232  . amantadine (SYMMETREL) solution 100 mg  100 mg Per Tube TID Ram Daniel NonesNarayan Kaveer Nandigam, MD   100 mg at 06/06/15 2151  . antiseptic oral rinse (CPC / CETYLPYRIDINIUM CHLORIDE 0.05%) solution 7 mL  7 mL Mouth Rinse q12n4p Drema Dallasurtis J Woods, MD   7 mL at 06/06/15 1751  . baclofen (LIORESAL) tablet 10 mg  10 mg Oral TID Ram Daniel NonesNarayan Kaveer Nandigam, MD   10 mg at 06/06/15 2153  . bromocriptine (PARLODEL) tablet 2.5 mg  2.5  mg Per Tube QID Lonia Blood, MD   2.5 mg at 06/06/15 2152  . chlorhexidine (PERIDEX) 0.12 % solution 15 mL  15 mL Mouth Rinse BID Drema Dallas, MD   15 mL at 06/06/15 2152  . clonazePAM (KLONOPIN) tablet 0.25 mg  0.25 mg Per Tube TID Drema Dallas, MD   0.25 mg at 06/06/15 2151  . diphenhydrAMINE (BENADRYL) 12.5 MG/5ML elixir 12.5 mg  12.5 mg Per Tube Q6H PRN Jinger Neighbors, NP      . enoxaparin (LOVENOX) injection 40 mg  40 mg Subcutaneous Q24H Lonia Blood, MD   40 mg at 06/06/15 2149  . feeding supplement (ENSURE ENLIVE) (ENSURE ENLIVE) liquid 237 mL  237 mL Oral TID BM Drema Dallas, MD   237 mL at 06/06/15 2053  . feeding supplement (VITAL AF 1.2 CAL) liquid 1,000 mL  1,000  mL Per Tube Q24H Lonia Blood, MD      . free water 200 mL  200 mL Per Tube 3 times per day Lonia Blood, MD   200 mL at 06/07/15 0657  . LORazepam (ATIVAN) tablet 1-2 mg  1-2 mg Oral Q4H PRN Drema Dallas, MD      . neomycin-bacitracin-polymyxin (NEOSPORIN) ointment   Topical BID Oretha Milch, MD      . ondansetron [facility]) tablet 4 mg  4 mg Oral Q6H PRN Drema Dallas, MD      . valproic acid (DEPAKENE) 250 MG/5ML syrup 250 mg  250 mg Oral 3 times per day Lonia Blood, MD   250 mg at 06/07/15 1610     Discharge Medications: Please see discharge summary for a list of discharge medications.  Relevant Imaging Results:  Relevant Lab Results:   Additional Information SSN [id number]  Derenda Fennel, MSW, LCSWA [phone] 06/07/2015 9:18 AM

## 2015-06-07 NOTE — Clinical Social Work Placement (Signed)
   CLINICAL SOCIAL WORK PLACEMENT  NOTE  Date:  06/07/2015  Patient Details  Name: Chase Hampton R XXXDillard MRN: [id number] Date of Birth: 05/30/1990  Clinical Social Work is seeking post-discharge placement for this patient at the Skilled  Nursing Facility level of care (*CSW will initial, date and re-position this form in  chart as items are completed):  Yes   Patient/family provided with [facility] Clinical Social Work Department's list of facilities offering this level of care within the geographic area requested by the patient (or if unable, by the patient's family).  Yes   Patient/family informed of their freedom to choose among providers that offer the needed level of care, that participate in Medicare, Medicaid or managed care program needed by the patient, have an available bed and are willing to accept the patient.  Yes   Patient/family informed of 's ownership interest in [facility] and [facility], as well as of the fact that they are under no obligation to receive care at these facilities.  PASRR submitted to EDS on 06/07/15     PASRR number received on       Existing PASRR number confirmed on       FL2 transmitted to all facilities in geographic area requested by pt/family on 06/07/15     FL2 transmitted to all facilities within larger geographic area on 06/07/15     Patient informed that his/her managed care company has contracts with or will negotiate with certain facilities, including the following:            Patient/family informed of bed offers received.  Patient chooses bed at       Physician recommends and patient chooses bed at      Patient to be transferred to   on  .  Patient to be transferred to facility by       Patient family notified on   of transfer.  Name of family member notified:        PHYSICIAN Please sign FL2     Additional Comment:    _______________________________________________ Vaughan BrownerNixon, Tymber Stallings A,  LCSW 06/07/2015, 5:42 PM

## 2015-06-08 DIAGNOSIS — G210 Malignant neuroleptic syndrome: Principal | ICD-10-CM

## 2015-06-08 LAB — GLUCOSE, CAPILLARY: GLUCOSE-CAPILLARY: 96 mg/dL (ref 65–99)

## 2015-06-08 NOTE — Care Management Obs Status (Deleted)
MEDICARE OBSERVATION STATUS NOTIFICATION   Patient Details  Name: Chase Hampton MRN: [id number] Date of Birth: 02/16/1991   Medicare Observation Status Notification Given:  Yes (MRI negative)    Kermit BaloKelli F Clearence Vitug, RN 06/08/2015, 2:06 PM

## 2015-06-08 NOTE — Clinical Social Work Note (Signed)
CSW received [facility] number which is [id number].  CSW continuing to try to find [facility].  Ervin KnackEric R. Rosalyn Archambault, MSW, Theresia MajorsLCSWA [phone] 06/08/2015 5:49 PM

## 2015-06-08 NOTE — Progress Notes (Signed)
[facility] PROGRESS NOTE  Chase Hampton WGN:[id number] DOB: 06/05/1990 DOA: 05/15/2015 PCP: [name], MD  Brief narrative 25 y/o Mw/ a Hx of Anxiety, Depression (prior [facility] hospitalizations) with recent admission to [facility] in [place] from 2/3 > 2/8 for major depressive disorder with homicidal / suicidal ideation after a break up with his fiancee. Due to restraining order violations, he was transferred from [facility] to [facility] jail were he had been on suicide watch. The patient reportedly refused to eat, drink or take his medications. He reportedly would only lay in the floor and urinate on himself. After multiple episodes of urinating on himself, he was asked to mop up the floor, which he was able to perform. Prior to discharge from [facility], notes reflect he had multiple episodes of physically aggressive behavior - including striking his own head on the seclusion room walls. At discharge he was to continue on Cymbalta, Depakote and Saphris. Prison staff report the patient refused all medications.   On 2/19, the patient was noted to have a seizure in jail. He was transported to [facility] ER via EMS. It is documented that he arrived to the ER with urine and feces all over him. There is question if patient was weaning off ativan?? Per [facility] notes but discharge note from [facility] does not include Rx for ativan. Room air saturations were 82%, he was treated with 100% NRB. He was treated with  IV versed per EMS en route. The patient had a second seizure in ER and was ultimately intubated. He was transferred to [facility] ICU for further evaluation. The patient arrived to [facility] on propofol, mechanically intubated.   Significant hospital course: 2/19 Admit from jail with seizure, intubated  2/20 LP > negative for infection  2/20 EEG > No active seizures.  2/21 Repeat EEG > no seizure activity 2/21 TEE normal  2/22 focal seizures mouth 2/27 MRI brain wo contrast normal 2/27 MRI T-spine/C-spine  - Edema posterior paraspinal musculature - minimal amount of retropharyngeal edema - Nonspecific posterior paravertebral edema extends into the upper thoracic region - Multiple bulging disks with cord flattening  -3/14: NG removed, referral sent out for SNF placement  Assessment/Plan: FUO (103.8 05/26/15)/ Altered mental status / ?NMS -NMS?- no evidence of infection  -Neurology was following patient was in the hospital. Continued on bromocriptine and amantadine. -No longer requiring a cooling blanket -avoid serotonin active agents or antipsychotics . -Mentation has improved but his family and patient answering questions appropriately but he is mostly somnolent and still not getting out of bed on his own. He is able to move his extremities . -Will need ongoing PT  Staph hominis bacteremia 2/2 + blood cultures upon admission to [facility] ED 05/15/2015  - TEE negative and LP benign - felt to be a genuine bacteremia per ID  Completed 2 weeks course of IV antibiotics on 05/29/2015, as per recommendations. -Blood culture repeated today to ensure clearance.  Acute Hypoxic Respiratory Failure  - Resolved   Hypernatremia  -due to hypovolemia - resolved   Hypotension  -resolved  Rhabdomyolysis\ Achy on admission >7500. Received IV hydration. Now resolved.  Bipolar D/O w/ Depression > Catatonia  -Appreciate psychiatry recommendations. Unsuspected NMS recommend to avoid antipsychotics. On scheduled low-dose Klonopin and when necessary Ativan for agitation. Continue Depakote.   DVT prophylaxis: subontinuous lovenox   Diet: On NG tube feeds. SLP has cleared for dysphagia level III diet. NG removed today. Encouraged by mouth intake.  Code Status: FULL code Family Communication: Parents  at bedside Disposition Plan: Needs skilled nursing facility. Will need outpatient psychiatry referral.  Consultants: Psychiatry PCCM Neurology  ID  Antibiotics: Rocephin 2/19 > 2/22 Vanco 2/20 >  2/22 Cefazolin 2/22 > 3/5   HPI/Subjective: Seen and examined. Patient has limited responses to examiner.  Objective: Filed Vitals:   06/08/15 1443 06/08/15 1813  BP: 104/75 113/64  Pulse: 105 101  Temp: 98.5 F (36.9 C) 97.7 F (36.5 C)  Resp: 17 17    Intake/Output Summary (Last 24 hours) at 06/08/15 1905 Last data filed at 06/08/15 0941  Gross per 24 hour  Intake    120 ml  Output   1100 ml  Net   -980 ml   Filed Weights   06/06/15 0423 06/07/15 0504 06/08/15 0525  Weight: 116.9 kg (257 lb 11.5 oz) 117.9 kg (259 lb 14.8 oz) 121.7 kg (268 lb 4.8 oz)    Exam:   General: alert and awake, in nad  HEENT: No pallor, moist mucosa  Cardiovascular: Normal S1 and S2, no murmurs rub or gallop  Respiratory: Clear bilaterally, no added sounds  Abdomen: Soft, nondistended, nontender, bowel sounds present  Musculoskeletal: Warm, no edema  CNS: no facial asymmetry  Data Reviewed: Basic Metabolic Panel:  Recent Labs Lab 06/02/15 0534  NA 139  K 4.0  CL 102  CO2 28  GLUCOSE 110*  BUN 19  CREATININE 0.64  CALCIUM 8.8*   Liver Function Tests:  Recent Labs Lab 06/02/15 0534  AST 29  ALT 40  ALKPHOS 57  BILITOT 0.7  PROT 5.7*  ALBUMIN 2.3*   No results for input(s): LIPASE, AMYLASE in the last 168 hours. No results for input(s): AMMONIA in the last 168 hours. CBC:  Recent Labs Lab 06/02/15 0534  WBC 9.3  HGB 12.3*  HCT 36.2*  MCV 87.0  PLT 217   Cardiac Enzymes: No results for input(s): CKTOTAL, CKMB, CKMBINDEX, TROPONINI in the last 168 hours. BNP (last 3 results) No results for input(s): BNP in the last 8760 hours.  ProBNP (last 3 results) No results for input(s): PROBNP in the last 8760 hours.  CBG:  Recent Labs Lab 06/07/15 1134 06/07/15 1704 06/07/15 2010 06/07/15 2354 06/08/15 0352  GLUCAP 121* 70 76 85 96    Recent Results (from the past 240 hour(s))  Culture, blood (Routine X 2) w Reflex to ID Panel     Status: None  (Preliminary result)   Collection Time: 06/07/15  7:10 AM  Result Value Ref Range Status   Specimen Description BLOOD LEFT HAND  Final   Special Requests IN PEDIATRIC BOTTLE 3CC  Final   Culture NO GROWTH 1 DAY  Final   Report Status PENDING  Incomplete     Studies: No results found.  Scheduled Meds: . amantadine  100 mg Oral TID  . antiseptic oral rinse  7 mL Mouth Rinse q12n4p  . baclofen  10 mg Oral TID  . bromocriptine  2.5 mg Per Tube QID  . chlorhexidine  15 mL Mouth Rinse BID  . enoxaparin (LOVENOX) injection  40 mg Subcutaneous Q24H  . feeding supplement (ENSURE ENLIVE)  237 mL Oral TID BM  . neomycin-bacitracin-polymyxin   Topical BID  . valproic acid  250 mg Oral 3 times per day   Continuous Infusions:      Time spent:25 minutes    Penny PiaVEGA, Daylin Gruszka  [facility] Pager [phone] If 7PM-7AM, please contact night-coverage at [email or web address], password [email or web address] 06/08/2015, 7:05 PM  LOS: 24 days

## 2015-06-08 NOTE — Progress Notes (Signed)
Physical Therapy Treatment Patient Details Name: Chase Hampton R XXXDillard MRN: [id number] DOB: 09/09/1990 Today's Date: 06/08/2015    History of Present Illness Pt is a 25 y/o M admitted from jail where he had multiple episodes of urinating on himself and physically aggressive behavior, including striking his own head on the wall.  Pt had another seizure in the ER and was ultimately intubated.  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  His PMH includes anxiety and depression.    PT Comments    Pt's mother was concerned that he has not been given a neurological inpt placement for further therapy.  Have contacted CM to share her concerns, and reported the request but have not received any bed offers yet for him.  Will continue to work on mobility as tolerated and try to increase his active standing with lift equipment and as his system tolerates.    Follow Up Recommendations  CIR;Supervision/Assistance - 24 hour     Equipment Recommendations  Wheelchair (measurements PT);Wheelchair cushion (measurements PT)    Recommendations for Other Services OT consult     Precautions / Restrictions Precautions Precautions: Fall Precaution Comments: seizure precautions; watch HR and RR Restrictions Weight Bearing Restrictions: No    Mobility  Bed Mobility Overal bed mobility: Needs Assistance Bed Mobility: Supine to Sit     Supine to sit: Mod assist;HOB elevated (Pt responded to PT requests for assisting to move)        Transfers Overall transfer level: Needs assistance Equipment used: 2 person hand held assist Transfers: Sit to/from [employer]Stand;Stand Pivot Transfers Sit to Stand: Mod assist;+2 physical assistance;+2 safety/equipment;From elevated surface Stand pivot transfers: Mod assist;+2 physical assistance;+2 safety/equipment;From elevated surface       General transfer comment: Pt was controlling sitting and reached with arms to assist PT and  nursing to stand, then pivoted on his feet with small steps  Ambulation/Gait             General Gait Details: transfer to chair   Stairs            Wheelchair Mobility    Modified Rankin (Stroke Patients Only)       Balance     Sitting balance-Leahy Scale: Fair     Standing balance support: Bilateral upper extremity supported Standing balance-Leahy Scale: Poor                      Cognition Arousal/Alertness: Awake/alert Behavior During Therapy: Flat affect Overall Cognitive Status: Difficult to assess Area of Impairment: Attention;Following commands;Awareness;Problem solving   Current Attention Level: Selective Memory: Decreased short-term memory Following Commands: Follows one step commands with increased time Safety/Judgement: Decreased awareness of deficits;Decreased awareness of safety Awareness: Intellectual Problem Solving: Slow processing;Difficulty sequencing;Requires verbal cues General Comments: spoke to PT with time to process the questions    Exercises General Exercises - Lower Extremity Heel Slides: AAROM;Both;10 reps Hip ABduction/ADduction: AAROM;Both;10 reps Straight Leg Raises: AAROM;Both;10 reps    General Comments General comments (skin integrity, edema, etc.): After a standing pivot to chair was able to ck pulse at 111 and O2 sats 97%.        Pertinent Vitals/Pain Pain Assessment: No/denies pain    Home Living                      Prior Function            PT Goals (current goals can now be found in the  care plan section) Acute Rehab PT Goals Patient Stated Goal: get up to chair Progress towards PT goals: Progressing toward goals    Frequency  Min 3X/week    PT Plan Current plan remains appropriate    Co-evaluation             End of Session   Activity Tolerance: Patient limited by fatigue;Treatment limited secondary to medical complications (Comment) (monitoring his vitals) Patient left:  in chair;with call bell/phone within reach;with chair alarm set;with nursing/sitter in room;with family/visitor present     Time: 7253-6644 PT Time Calculation (min) (ACUTE ONLY): 28 min  Charges:  $Therapeutic Exercise: 8-22 mins $Therapeutic Activity: 8-22 mins                    G Codes:      Ivar Drape 06/20/2015, 8:08 PM   Samul Dada, PT MS Acute Rehab Dept. Number: [facility] [id number] and [facility] [phone]

## 2015-06-08 NOTE — Consult Note (Addendum)
Chase Hampton  Reason for Consult:  Major depression, TBI, noncompliant while in detention, benzo withdrawal seizures and NMS Referring Physician:  Dr. Titus Mould Patient Identification: Chase Hampton MRN:  [id number] Principal Diagnosis: Neuroleptic malignant syndrome Diagnosis:   Patient Active Problem List   Diagnosis Date Noted  . Hypotension [I95.9]   . Weakness [R53.1]   . Rigidity [R29.898]   . Muscle spasm [M62.838]   . Acute respiratory failure with hypoxia and hypercapnia (HCC) [J96.01, J96.02]   . Encephalopathy acute [G93.40]   . Catatonia ([place]) [F06.1]   . FUO (fever of unknown origin) [R50.9]   . Depression [F32.9]   . Neuroleptic malignant syndrome [G21.0]   . Seizure ([place]) [R56.9]   . Encounter for orogastric (OG) tube placement [Z46.59]   . Acute respiratory failure ([place]) [J96.00]   . Coagulase negative Staphylococcus bacteremia [R78.81]   . Bipolar I disorder, most recent episode depressed ([place]) [F31.30]   . Suicidal ideation [R45.851]   . Homicidal ideation [R45.850]   . 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 Acute encephalopathy [G93.40]   . Seizures ([place]) [R56.9]   . Status epilepticus ([place]) [G40.901] 05/15/2015  . Major depressive disorder, recurrent episode, moderate ([place]) [F33.1] 04/30/2015    Total Time spent with patient: 30 minutes  Subjective:   Chase Hampton is a 25 y.o. male patient admitted with status post seizures and depression.  HPI:   Chase Hampton is a 25 years old single male admitted to [facility] medical intensive care unit for status post benzodiazepine withdrawal seizures and increased symptoms of bipolar depression, anxiety, anger outbursts and bizarre behaviors. Reviewed neurology consultation and their follow-ups regarding seizures and possible catatonia. Patient is also has multiple legal charges regarding violation of restraining order and multiple acute psychiatric hospitalization since  November 2016 for depression, anger outbursts, suicidal and homicidal ideation, intention and plans. Review of medical records indicated he was hospitalized at least 3 times at [facility] both voluntarily and involuntarily and also in [place]. Patient father reported that he has a severe allergic reaction to Haldol given during the second hospitalization. [facility] and then transferred to [facility] for continuous EEG monitoring. Patient mother, father and sister were in the hospital and provided detailed history of deterioration of mental status and noncompliant with the medication management and withdrawal symptoms. Patient also has history of traumatic brain injury, concussion injury while playing football and shoulder injury while playing rugby.   Past Psychiatric History: no prior psychiatric history before Nov 2016 (no suicidal attempts, never prescribed with any psychotropics).   1st Hospitalization in our unit from 02/23/15 to 03/01/15. "presented to the emergency department voicing worsening depression, thoughts of suicide and some homicidal ideation towards his girlfriend. These worsening of symptoms was triggered after his girlfriend of 5 years broke up with him. Patient reports a long history of anger issues that started in childhood. The patient also has history of multiple concussions due to playing football. Per reports from the patient's family he was holding a gun and threatening to kill himself and her." He was diagnosed with depression and was started on fluoxetine. 2nd Hospitalization in our unit: 03/07/15 to 03/14/15: "presented to our ER on 67/54 under police custody. According to report he got into an altercation with his father and then stated he was getting in his car to go kill his fiance and her family which lives very close by. Police  were called. The patient was belligerent and violent."  Diagnosed with MDD and d/c on fluoxetine and depakote for irritability and anger. 3rd Hospitalization in [place]: no records available: jump out of his parents car and tried to tie a cord around his neck. Per pt, prozac was increased to 40 mg. Depakote was d/c. After his second hospitalization he f/u with [facility] in [place] where saphris was added to prozac and depakote. He also f/o with [facility] for therapy.  Interval history: Patient seen for psychiatric Hampton and briefly spoke with the patient father who was at bedside. Patient appeared lying in his bed, closing his eyes but able to respond verbally and also able to move his legs and hands, head and neck from left-to-right voluntarily when prompted. Patient father reported he has been cooperative with physical therapist, able to sit in a chair next to bed about 2 hours and also reportedly lift himself from the chair briefly and able to stand on his feet, able to pull his legs from the ground to the bed without much assistance. Patient endorsed feeling depressed and sad being in the hospital and his current situation. Patient is able to briefly laughed during my communication with him. Patient does not know why he has been more sleepy during daytime and also complains generalized body pains secondary to limited mobility in his bed. Patient has no high fevers at this time. Reportedly patient eating only 10% of his meals or less. Patient continued to try to eat and drink but unable to use his chewing muscles. Patient is able to open his mouth and stick out his tongue briefly. He cannot function and has no ADL's at current physical condition and his family cannot provide medical care at home. Psychiatric consultation will continue to Hampton and will provide appropriate recommendations when he is medically stable.   Risk to Self: Is patient at risk for suicide?: No Risk to Others:   Prior Inpatient Therapy:   Prior Outpatient Therapy:     Past Medical History:  Past Medical History  Diagnosis Date  . GERD (gastroesophageal reflux disease)   . Anxiety   . Depression     Past Surgical History  Procedure Laterality Date  . No past surgeries     Family History:  Family History  Problem Relation Age of Onset  . Heart disease Maternal Grandfather   . Diabetes Maternal Grandfather   . Heart disease Paternal Grandmother    Family Psychiatric  History: Family history of depression.  Social History:  History  Alcohol Use No     History  Drug Use  . Yes  . Special: Marijuana    Social History   Social History  . Marital Status: Single    Spouse Name: N/A  . Number of Children: N/A  . Years of Education: N/A   Social History Main Topics  . Smoking status: Former Smoker    Types: Cigars  . Smokeless tobacco: Never Used  . Alcohol Use: No  . Drug Use: Yes    Special: Marijuana  . Sexual Activity: No   Other Topics Concern  . None   Social History Narrative   Additional Social History:    Allergies:   Allergies  Allergen Reactions  . Haldol [Haloperidol Lactate] Other (See Comments)    Angioedema [facility] lists no allergies as of 05/15/15)    Labs:  Results for orders placed or performed during the hospital encounter of 05/15/15 (from the past 48 hour(s))  Glucose, capillary     Status: Abnormal   Collection Time: 06/06/15  4:27 PM  Result Value Ref Range   Glucose-Capillary 121 (H) 65 - 99 mg/dL   Comment 1 Notify RN    Comment 2 Document in Chart   Glucose, capillary     Status: None   Collection Time: 06/06/15  8:28 PM  Result Value Ref Range   Glucose-Capillary 85 65 - 99 mg/dL   Comment 1 Notify RN    Comment 2 Document in Chart   Glucose, capillary     Status: Abnormal   Collection Time: 06/06/15 11:53 PM  Result Value Ref Range   Glucose-Capillary 113 (H) 65 - 99 mg/dL   Comment 1 Notify RN    Comment 2 Document in Chart   Glucose, capillary     Status: None    Collection Time: 06/07/15  4:46 AM  Result Value Ref Range   Glucose-Capillary 99 65 - 99 mg/dL   Comment 1 Notify RN    Comment 2 Document in Chart   Culture, blood (Routine X 2) w Reflex to ID Panel     Status: None (Preliminary result)   Collection Time: 06/07/15  7:10 AM  Result Value Ref Range   Specimen Description BLOOD LEFT HAND    Special Requests IN PEDIATRIC BOTTLE 3CC    Culture NO GROWTH 1 DAY    Report Status PENDING   Glucose, capillary     Status: None   Collection Time: 06/07/15  8:52 AM  Result Value Ref Range   Glucose-Capillary 93 65 - 99 mg/dL   Comment 1 Notify RN    Comment 2 Document in Chart   Glucose, capillary     Status: Abnormal   Collection Time: 06/07/15 11:34 AM  Result Value Ref Range   Glucose-Capillary 121 (H) 65 - 99 mg/dL  Glucose, capillary     Status: None   Collection Time: 06/07/15  5:04 PM  Result Value Ref Range   Glucose-Capillary 70 65 - 99 mg/dL  Glucose, capillary     Status: None   Collection Time: 06/07/15  8:10 PM  Result Value Ref Range   Glucose-Capillary 76 65 - 99 mg/dL   Comment 1 Notify RN    Comment 2 Document in Chart   Glucose, capillary     Status: None   Collection Time: 06/07/15 11:54 PM  Result Value Ref Range   Glucose-Capillary 85 65 - 99 mg/dL   Comment 1 Notify RN    Comment 2 Document in Chart   Glucose, capillary     Status: None   Collection Time: 06/08/15  3:52 AM  Result Value Ref Range   Glucose-Capillary 96 65 - 99 mg/dL   Comment 1 Notify RN    Comment 2 Document in Chart     Current Facility-Administered Medications  Medication Dose Route Frequency Provider Last Rate Last Dose  . acetaminophen (TYLENOL) solution 500 mg  500 mg Per Tube Q4H PRN Lonia Blood, MD   500 mg at 06/03/15 2232  . amantadine (SYMMETREL) capsule 100 mg  100 mg Oral TID Nishant Dhungel, MD   100 mg at 06/08/15 1008  . antiseptic oral rinse (CPC / CETYLPYRIDINIUM CHLORIDE 0.05%) solution 7 mL  7 mL Mouth Rinse  q12n4p Drema Dallas, MD   7 mL at 06/08/15 1358  . baclofen (LIORESAL) tablet 10 mg  10 mg Oral TID Ram Daniel Nones, MD   10 mg at 06/08/15  1008  . bromocriptine (PARLODEL) tablet 2.5 mg  2.5 mg Per Tube QID Lonia Blood, MD   2.5 mg at 06/08/15 1357  . chlorhexidine (PERIDEX) 0.12 % solution 15 mL  15 mL Mouth Rinse BID Drema Dallas, MD   15 mL at 06/08/15 1008  . clonazePAM (KLONOPIN) tablet 0.25 mg  0.25 mg Per Tube TID Drema Dallas, MD   0.25 mg at 06/08/15 1009  . diphenhydrAMINE (BENADRYL) 12.5 MG/5ML elixir 12.5 mg  12.5 mg Per Tube Q6H PRN Jinger Neighbors, NP      . enoxaparin (LOVENOX) injection 40 mg  40 mg Subcutaneous Q24H Lonia Blood, MD   40 mg at 06/07/15 2313  . feeding supplement (ENSURE ENLIVE) (ENSURE ENLIVE) liquid 237 mL  237 mL Oral TID BM Drema Dallas, MD   237 mL at 06/08/15 1358  . LORazepam (ATIVAN) tablet 1-2 mg  1-2 mg Oral Q4H PRN Drema Dallas, MD      . neomycin-bacitracin-polymyxin (NEOSPORIN) ointment   Topical BID Oretha Milch, MD      . ondansetron [facility]) tablet 4 mg  4 mg Oral Q6H PRN Drema Dallas, MD      . valproic acid (DEPAKENE) 250 MG/5ML syrup 250 mg  250 mg Oral 3 times per day Lonia Blood, MD   250 mg at 06/08/15 1358    Musculoskeletal: Strength & Muscle Tone: decreased Gait & Station: unable to stand Patient leans: N/A  Psychiatric Specialty Exam: Review of Systems:   Blood pressure 119/70, pulse 86, temperature 98.2 F (36.8 C), temperature source Oral, resp. rate 18, height  (1.753 m), weight 121.7 kg (268 lb 4.8 oz), SpO2 97 %.Body mass index is 39.6 kg/(m^2).  General Appearance: Guarded  Eye Contact::  Fair  Speech:  Clear and Coherent and Slow  Volume:  Decreased  Mood:  Depressed  Affect:  Constricted and Depressed  Thought Process:  Coherent and Goal Directed  Orientation:  Full (Time, Place, and Person)  Thought Content:  WDL and Wants to go home  Suicidal Thoughts:  No  Homicidal  Thoughts:  No  Memory:  Immediate;   Fair Recent;   Fair  Judgement:  Fair  Insight:  Shallow  Psychomotor Activity:  Limited psychomotor activity  Concentration:  Fair  Recall:  [employer] of Knowledge:Fair  Language: Fair  Akathisia:  Negative  Handed:  Right  AIMS (if indicated):     Assets:  Communication Skills Desire for Improvement Financial Resources/Insurance Housing Leisure Time Resilience Social Support Talents/Skills Transportation  ADL's:  Impaired  Cognition: Impaired,  Severe  Sleep:      Treatment Plan Summary:  Patient admitted with major depressive disorder, intermittent explosive disorder, TBI, status post seizures, status post intubation/extubation and recent history of multiple acute psychiatric hospitalization at [facility] and also [place].   Reportedly noncompliant with medications while being in detention center/Jail, has history of multiple episodes of uncontrollable anger outbursts, intermittent explosive behaviors, TBI, suicidal and homicidal threats in the past.    Neuroleptic malignant syndrome : Avoid psychotropic medication until completely recovered.   Patient has been eating and drinking but very minimal amounts.   Patient has no current agitation, and aggression since yesterday  Discontinue clonazepam scheduled medication and also Benadryl when necessary because of sedati Continue Ativan 1-2 mg every 4 hours as needed for excessive anxiety and agitation   Check comprehensive metabolic panel and valproic acid level tomorrow  morning and is currently taking Depakote 250 mg 3 times daily for epileptic seizures and his Keppra was discontinued.  Appreciate psychiatric consultation and follow up as clinically required Please contact [phone] or [phone] if needs further assistance  Disposition: Patient does not meet criteria for inpatient psychiatric hospitalization  Patient benefit from PT and OT and possibly needed  inpatient rehabilitation services when medically stable.    Nehemiah SettleJONNALAGADDA,JANARDHAHA R., MD 06/08/2015 2:37 PM

## 2015-06-09 LAB — COMPREHENSIVE METABOLIC PANEL
ALT: 32 U/L (ref 17–63)
AST: 17 U/L (ref 15–41)
Albumin: 2.5 g/dL — ABNORMAL LOW (ref 3.5–5.0)
Alkaline Phosphatase: 55 U/L (ref 38–126)
Anion gap: 9 (ref 5–15)
BUN: 10 mg/dL (ref 6–20)
CHLORIDE: 101 mmol/L (ref 101–111)
CO2: 31 mmol/L (ref 22–32)
Calcium: 9.4 mg/dL (ref 8.9–10.3)
Creatinine, Ser: 0.78 mg/dL (ref 0.61–1.24)
GFR calc Af Amer: >60 mL/min (ref >60)
Glucose, Bld: 102 mg/dL — ABNORMAL HIGH (ref 65–99)
POTASSIUM: 4 mmol/L (ref 3.5–5.1)
SODIUM: 141 mmol/L (ref 135–145)
Total Bilirubin: 0.6 mg/dL (ref 0.3–1.2)
Total Protein: 5.7 g/dL — ABNORMAL LOW (ref 6.5–8.1)

## 2015-06-09 LAB — VALPROIC ACID LEVEL: VALPROIC ACID LVL: 12 ug/mL — AB (ref 50.0–100.0)

## 2015-06-09 MED ORDER — BACLOFEN 10 MG PO TABS
10.0000 mg | ORAL_TABLET | Freq: Two times a day (BID) | ORAL | Status: DC
  Administered 2015-06-10 – 2015-06-14 (×10): 10 mg via ORAL
  Filled 2015-06-09 (×10): qty 1

## 2015-06-09 MED ORDER — BENZTROPINE MESYLATE 0.5 MG PO TABS
0.5000 mg | ORAL_TABLET | Freq: Two times a day (BID) | ORAL | Status: DC
  Administered 2015-06-09 – 2015-06-13 (×8): 0.5 mg via ORAL
  Filled 2015-06-09 (×8): qty 1

## 2015-06-09 MED ORDER — BACLOFEN 10 MG PO TABS
20.0000 mg | ORAL_TABLET | Freq: Every day | ORAL | Status: DC
  Administered 2015-06-09 – 2015-06-13 (×5): 20 mg via ORAL
  Filled 2015-06-09 (×5): qty 2

## 2015-06-09 NOTE — Progress Notes (Signed)
Speech Language Pathology Treatment: Dysphagia  Patient Details Name: Chase Hampton MRN: [id number] DOB: 11/06/1990 Today's Date: 06/09/2015 Time: [id number] SLP Time Calculation (min) (ACUTE ONLY): 26 min  Assessment / Plan / Recommendation Clinical Impression  Pt's mother and grandmother report poor mastication and oral clearance with advanced diet textures, but good tolerance of thin liquids. SLP provided Dys 2 textures which pt consumed with overall Mod A for oral clearance. Liquid washes appeared to be most effective at clearing residue. Recommend downgrade back to Dys 2 diet and thin liquids. Will continue to follow.   HPI HPI: Pt with hx of GERD, [facility]. [facility] [place] [facility] where he refused to eat/drink or take meds. Pt noted to have seizure and [facility] where he suffered another seizure and was intubated 2/19-2/28. Pt has remained lethargic since then. CT spine shows Edema posterior paraspinal musculature - minimal amount of retropharyngeal edema.       SLP Plan  Continue with current plan of care     Recommendations  Diet recommendations: Dysphagia 2 (fine chop);Thin liquid Liquids provided via: Cup;Straw Medication Administration: Crushed with puree Supervision: Patient able to self feed;Full supervision/cueing for compensatory strategies Compensations: Follow solids with liquid;Small sips/bites;Slow rate;Lingual sweep for clearance of pocketing Postural Changes and/or Swallow Maneuvers: Seated upright 90 degrees             Oral Care Recommendations: Oral care BID Follow up Recommendations: Skilled Nursing facility Plan: Continue with current plan of care     GO               Chase HamLaura Hampton, M.A. CCC-SLP [phone]  Chase Hamaiewonsky, Chase Hampton 06/09/2015, 3:53 PM

## 2015-06-09 NOTE — Clinical Social Work Note (Signed)
Clinical Social Worker attempted to contact patient's mother, Chase Hampton in regards to discharge planning and current barriers to discharge.   Currently patient does not have any bed offers for SNF placement.   CSW remains available as needed.  [name], MSW, LCSWA [phone] 06/09/2015 12:29 PM

## 2015-06-09 NOTE — Progress Notes (Signed)
[facility] PROGRESS NOTE  CAMEREN EARNEST ZOX:[id number] DOB: 09-Mar-1991 DOA: 05/15/2015 PCP: [name], MD  Brief narrative 25 y/o Mw/ a Hx of Anxiety, Depression (prior [facility] hospitalizations) with recent admission to [facility] in [place] from 2/3 > 2/8 for major depressive disorder with homicidal / suicidal ideation after a break up with his fiancee. Due to restraining order violations, he was transferred from [facility] to [facility] jail were he had been on suicide watch. The patient reportedly refused to eat, drink or take his medications. He reportedly would only lay in the floor and urinate on himself. After multiple episodes of urinating on himself, he was asked to mop up the floor, which he was able to perform. Prior to discharge from [facility], notes reflect he had multiple episodes of physically aggressive behavior - including striking his own head on the seclusion room walls. At discharge he was to continue on Cymbalta, Depakote and Saphris. Prison staff report the patient refused all medications.   On 2/19, the patient was noted to have a seizure in jail. He was transported to [facility] ER via EMS. It is documented that he arrived to the ER with urine and feces all over him. There is question if patient was weaning off ativan?? Per [facility] notes but discharge note from [facility] does not include Rx for ativan. Room air saturations were 82%, he was treated with 100% NRB. He was treated with  IV versed per EMS en route. The patient had a second seizure in ER and was ultimately intubated. He was transferred to [facility] ICU for further evaluation. The patient arrived to [facility] on propofol, mechanically intubated.   Significant hospital course: 2/19 Admit from jail with seizure, intubated  2/20 LP > negative for infection  2/20 EEG > No active seizures.  2/21 Repeat EEG > no seizure activity 2/21 TEE normal  2/22 focal seizures mouth 2/27 MRI brain wo contrast normal 2/27 MRI T-spine/C-spine  - Edema posterior paraspinal musculature - minimal amount of retropharyngeal edema - Nonspecific posterior paravertebral edema extends into the upper thoracic region - Multiple bulging disks with cord flattening  -3/14: NG removed, referral sent out for SNF placement  Assessment/Plan: FUO (103.8 05/26/15)/ Altered mental status / ?NMS -NMS?- no evidence of infection  -Neurology was following patient was in the hospital. Currently on bromocriptine and amantadine. -No longer requiring a cooling blanket -avoid serotonin active agents or antipsychotics . -Will need ongoing PT  Staph hominis bacteremia 2/2 + blood cultures upon admission to [facility] ED 05/15/2015  - TEE negative and LP benign - felt to be a genuine bacteremia per ID  Completed 2 weeks course of IV antibiotics on 05/29/2015, as per recommendations. -Blood culture repeated today to ensure clearance. (repeast Blood cx still negative.)  Acute Hypoxic Respiratory Failure  - Resolved   Hypernatremia  -due to hypovolemia - resolved   Hypotension  -resolved  Rhabdomyolysis\ Achy on admission >7500. Received IV hydration. Now resolved.  Bipolar D/O w/ Depression > Catatonia  -Appreciate psychiatry recommendations. Unsuspected NMS recommended to avoid antipsychotics. On scheduled low-dose Klonopin and when necessary Ativan for agitation. Continue Depakote.   DVT prophylaxis: subontinuous lovenox   Diet: On NG tube feeds. SLP has cleared for dysphagia level III diet. NG removed today. Encouraged by mouth intake.  Code Status: FULL code Family Communication: Parents at bedside Disposition Plan: Needs skilled nursing facility. Will need outpatient psychiatry referral.  Consultants: Psychiatry PCCM Neurology  ID  Antibiotics: Rocephin 2/19 > 2/22 Vanco 2/20 >  2/22 Cefazolin 2/22 > 3/5   HPI/Subjective: Patient does not interact with examiner. Does not make eye contact  Objective: Filed Vitals:   06/09/15 0519  06/09/15 1027  BP: 109/57 123/71  Pulse: 84 101  Temp: 98.2 F (36.8 C) 98.2 F (36.8 C)  Resp: 18 19    Intake/Output Summary (Last 24 hours) at 06/09/15 1315 Last data filed at 06/09/15 1138  Gross per 24 hour  Intake      0 ml  Output    700 ml  Net   -700 ml   Filed Weights   06/07/15 0504 06/08/15 0525 06/09/15 0408  Weight: 117.9 kg (259 lb 14.8 oz) 121.7 kg (268 lb 4.8 oz) 117 kg (257 lb 15 oz)    Exam:   General: awake and in NAD, does not respond to examiner.  HEENT: No pallor, moist mucosa  Cardiovascular: Normal S1 and S2, no murmurs rub or gallop  Respiratory: Clear bilaterally, no added sounds  Abdomen: Soft, nondistended, nontender, bowel sounds present  Musculoskeletal: Warm, no edema  CNS: no facial asymmetry  Data Reviewed: Basic Metabolic Panel:  Recent Labs Lab 06/09/15 0555  NA 141  K 4.0  CL 101  CO2 31  GLUCOSE 102*  BUN 10  CREATININE 0.78  CALCIUM 9.4   Liver Function Tests:  Recent Labs Lab 06/09/15 0555  AST 17  ALT 32  ALKPHOS 55  BILITOT 0.6  PROT 5.7*  ALBUMIN 2.5*   No results for input(s): LIPASE, AMYLASE in the last 168 hours. No results for input(s): AMMONIA in the last 168 hours. CBC: No results for input(s): WBC, NEUTROABS, HGB, HCT, MCV, PLT in the last 168 hours. Cardiac Enzymes: No results for input(s): CKTOTAL, CKMB, CKMBINDEX, TROPONINI in the last 168 hours. BNP (last 3 results) No results for input(s): BNP in the last 8760 hours.  ProBNP (last 3 results) No results for input(s): PROBNP in the last 8760 hours.  CBG:  Recent Labs Lab 06/07/15 1134 06/07/15 1704 06/07/15 2010 06/07/15 2354 06/08/15 0352  GLUCAP 121* 70 76 85 96    Recent Results (from the past 240 hour(s))  Culture, blood (Routine X 2) w Reflex to ID Panel     Status: None (Preliminary result)   Collection Time: 06/07/15  7:10 AM  Result Value Ref Range Status   Specimen Description BLOOD LEFT HAND  Final   Special  Requests IN PEDIATRIC BOTTLE 3CC  Final   Culture NO GROWTH 2 DAYS  Final   Report Status PENDING  Incomplete  Culture, blood (Routine X 2) w Reflex to ID Panel     Status: None (Preliminary result)   Collection Time: 06/07/15  7:20 AM  Result Value Ref Range Status   Specimen Description BLOOD RIGHT ANTECUBITAL  Final   Special Requests BOTTLES DRAWN AEROBIC ONLY 8CC  Final   Culture NO GROWTH 1 DAY  Final   Report Status PENDING  Incomplete     Studies: No results found.  Scheduled Meds: . amantadine  100 mg Oral TID  . antiseptic oral rinse  7 mL Mouth Rinse q12n4p  . baclofen  10 mg Oral TID  . bromocriptine  2.5 mg Per Tube QID  . chlorhexidine  15 mL Mouth Rinse BID  . enoxaparin (LOVENOX) injection  40 mg Subcutaneous Q24H  . feeding supplement (ENSURE ENLIVE)  237 mL Oral TID BM  . neomycin-bacitracin-polymyxin   Topical BID  . valproic acid  250 mg Oral 3 times per  day   Continuous Infusions:    Time spent:25 minutes  Penny Pia  [facility] Pager [phone] If 7PM-7AM, please contact night-coverage at [email or web address], password [facility] 06/09/2015, 1:15 PM  LOS: 25 days

## 2015-06-09 NOTE — Consult Note (Signed)
[name] Consult follow-up  Reason for Consult:  Major depression, TBI, noncompliant while in detention, benzo withdrawal seizures and NMS Referring Physician:  Dr. Titus Mould Patient Identification: Chase Hampton MRN:  [id number] Principal Diagnosis: Neuroleptic malignant syndrome Diagnosis:   Patient Active Problem List   Diagnosis Date Noted  . Hypotension [I95.9]   . Weakness [R53.1]   . Rigidity [R29.898]   . Muscle spasm [M62.838]   . Acute respiratory failure with hypoxia and hypercapnia (HCC) [J96.01, J96.02]   . Encephalopathy acute [G93.40]   . Catatonia ([place]) [F06.1]   . FUO (fever of unknown origin) [R50.9]   . Depression [F32.9]   . Neuroleptic malignant syndrome [G21.0]   . Seizure ([place]) [R56.9]   . Encounter for orogastric (OG) tube placement [Z46.59]   . Acute respiratory failure ([place]) [J96.00]   . Coagulase negative Staphylococcus bacteremia [R78.81]   . Bipolar I disorder, most recent episode depressed ([place]) [F31.30]   . Suicidal ideation [R45.851]   . Homicidal ideation [R45.850]   . 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 Acute encephalopathy [G93.40]   . Seizures ([place]) [R56.9]   . Status epilepticus ([place]) [G40.901] 05/15/2015  . Major depressive disorder, recurrent episode, moderate ([place]) [F33.1] 04/30/2015    Total Time spent with patient: 30 minutes  Subjective:   Chase Hampton is a 25 y.o. male patient admitted with status post seizures and depression.  HPI:   Chase Hampton is a 25 years old single male admitted to [facility] medical intensive care unit for status post benzodiazepine withdrawal seizures and increased symptoms of bipolar depression, anxiety, anger outbursts and bizarre behaviors. Reviewed neurology consultation and their follow-ups regarding seizures and possible catatonia. Patient is also has multiple legal charges regarding violation of restraining order and multiple acute psychiatric hospitalization since  November 2016 for depression, anger outbursts, suicidal and homicidal ideation, intention and plans. Review of medical records indicated he was hospitalized at least 3 times at [facility] both voluntarily and involuntarily and also in [place]. Patient father reported that he has a severe allergic reaction to Haldol given during the second hospitalization. [facility] and then transferred to [facility] for continuous EEG monitoring. Patient mother, father and sister were in the hospital and provided detailed history of deterioration of mental status and noncompliant with the medication management and withdrawal symptoms. Patient also has history of traumatic brain injury, concussion injury while playing football and shoulder injury while playing rugby.   Past Psychiatric History: no prior psychiatric history before Nov 2016 (no suicidal attempts, never prescribed with any psychotropics).   1st Hospitalization in our unit from 02/23/15 to 03/01/15. "presented to the emergency department voicing worsening depression, thoughts of suicide and some homicidal ideation towards his girlfriend. These worsening of symptoms was triggered after his girlfriend of 5 years broke up with him. Patient reports a long history of anger issues that started in childhood. The patient also has history of multiple concussions due to playing football. Per reports from the patient's family he was holding a gun and threatening to kill himself and her." He was diagnosed with depression and was started on fluoxetine. 2nd Hospitalization in our unit: 03/07/15 to 03/14/15: "presented to our ER on 67/54 under police custody. According to report he got into an altercation with his father and then stated he was getting in his car to go kill his fiance and her family which lives very close by. Police  were called. The patient was belligerent and violent."  Diagnosed with MDD and d/c on fluoxetine and depakote for irritability and anger. 3rd Hospitalization in [place]: no records available: jump out of his parents car and tried to tie a cord around his neck. Per pt, prozac was increased to 40 mg. Depakote was d/c. After his second hospitalization he f/u with [facility] in [place] where saphris was added to prozac and depakote. He also f/o with [facility] for therapy.  Interval history: Patient is awake, alert, oriented. seen for psychiatric follow-up and briefly spoke with the patient father who was at bedside. Patient is able to drink and eat apple sauce and swallowing his medication with apple sauce. He has ongoing muscle rigidity, fine and gross tremors, and pin rolling movements on his hands. Patient mother is concern about the case manager has been talking about disposition plans and having hard time to get acceptance from SNF etc. He is able to follow directions and move head, neck, hands and legs with some limitation.   Patient is eating less than 10% of his meals. Patient continued to try to eat and drink but unable to use his chewing muscles. Patient is able to open his mouth and stick out his tongue briefly. He has no ADL's at current physical condition and his family cannot provide medical care at home. Psychiatric consultation will continue to follow-up and will provide support and appropriate medication recommendations as necessary    Risk to Self: Is patient at risk for suicide?: No Risk to Others:   Prior Inpatient Therapy:   Prior Outpatient Therapy:    Past Medical History:  Past Medical History  Diagnosis Date  . GERD (gastroesophageal reflux disease)   . Anxiety   . Depression     Past Surgical History  Procedure Laterality Date  . No past surgeries     Family History:  Family History  Problem Relation Age of Onset  . Heart disease Maternal Grandfather   . Diabetes Maternal Grandfather   . Heart disease  Paternal Grandmother    Family Psychiatric  History: Family history of depression.  Social History:  History  Alcohol Use No     History  Drug Use  . Yes  . Special: Marijuana    Social History   Social History  . Marital Status: Single    Spouse Name: N/A  . Number of Children: N/A  . Years of Education: N/A   Social History Main Topics  . Smoking status: Former Smoker    Types: Cigars  . Smokeless tobacco: Never Used  . Alcohol Use: No  . Drug Use: Yes    Special: Marijuana  . Sexual Activity: No   Other Topics Concern  . None   Social History Narrative   Additional Social History:    Allergies:   Allergies  Allergen Reactions  . Haldol [Haloperidol Lactate] Other (See Comments)    Angioedema [facility] lists no allergies as of 05/15/15)    Labs:  Results for orders placed or performed during the hospital encounter of 05/15/15 (from the past 48 hour(s))  Glucose, capillary     Status: None   Collection Time: 06/07/15  5:04 PM  Result Value Ref Range   Glucose-Capillary 70 65 - 99 mg/dL  Glucose, capillary     Status: None   Collection Time: 06/07/15  8:10 PM  Result Value Ref Range   Glucose-Capillary 76 65 - 99 mg/dL   Comment 1 Notify RN  Comment 2 Document in Chart   Glucose, capillary     Status: None   Collection Time: 06/07/15 11:54 PM  Result Value Ref Range   Glucose-Capillary 85 65 - 99 mg/dL   Comment 1 Notify RN    Comment 2 Document in Chart   Glucose, capillary     Status: None   Collection Time: 06/08/15  3:52 AM  Result Value Ref Range   Glucose-Capillary 96 65 - 99 mg/dL   Comment 1 Notify RN    Comment 2 Document in Chart   Valproic acid level     Status: Abnormal   Collection Time: 06/09/15  5:55 AM  Result Value Ref Range   Valproic Acid Lvl 12 (L) 50.0 - 100.0 ug/mL  Comprehensive metabolic panel     Status: Abnormal   Collection Time: 06/09/15  5:55 AM  Result Value Ref Range   Sodium 141 135 - 145 mmol/L    Potassium 4.0 3.5 - 5.1 mmol/L   Chloride 101 101 - 111 mmol/L   CO2 31 22 - 32 mmol/L   Glucose, Bld 102 (H) 65 - 99 mg/dL   BUN 10 6 - 20 mg/dL   Creatinine, Ser 0.78 0.61 - 1.24 mg/dL   Calcium 9.4 8.9 - 10.3 mg/dL   Total Protein 5.7 (L) 6.5 - 8.1 g/dL   Albumin 2.5 (L) 3.5 - 5.0 g/dL   AST 17 15 - 41 U/L   ALT 32 17 - 63 U/L   Alkaline Phosphatase 55 38 - 126 U/L   Total Bilirubin 0.6 0.3 - 1.2 mg/dL   GFR calc non Af Amer >60 >60 mL/min   GFR calc Af Amer >60 >60 mL/min    Comment: (NOTE) The eGFR has been calculated using the CKD EPI equation. This calculation has not been validated in all clinical situations. eGFR's persistently <60 mL/min signify possible Chronic Kidney Disease.    Anion gap 9 5 - 15    Current Facility-Administered Medications  Medication Dose Route Frequency Provider Last Rate Last Dose  . acetaminophen (TYLENOL) solution 500 mg  500 mg Per Tube Q4H PRN Cherene Altes, MD   500 mg at 06/03/15 2232  . amantadine (SYMMETREL) capsule 100 mg  100 mg Oral TID Nishant Dhungel, MD   100 mg at 06/09/15 1608  . antiseptic oral rinse (CPC / CETYLPYRIDINIUM CHLORIDE 0.05%) solution 7 mL  7 mL Mouth Rinse q12n4p Allie Bossier, MD   7 mL at 06/09/15 1614  . baclofen (LIORESAL) tablet 10 mg  10 mg Oral TID Ram Fuller Mandril, MD   10 mg at 06/09/15 1608  . bromocriptine (PARLODEL) tablet 2.5 mg  2.5 mg Per Tube QID Cherene Altes, MD   2.5 mg at 06/09/15 1416  . chlorhexidine (PERIDEX) 0.12 % solution 15 mL  15 mL Mouth Rinse BID Allie Bossier, MD   15 mL at 06/09/15 1124  . enoxaparin (LOVENOX) injection 40 mg  40 mg Subcutaneous Q24H Cherene Altes, MD   40 mg at 06/08/15 2036  . feeding supplement (ENSURE ENLIVE) (ENSURE ENLIVE) liquid 237 mL  237 mL Oral TID BM Allie Bossier, MD   237 mL at 06/09/15 1416  . LORazepam (ATIVAN) tablet 1-2 mg  1-2 mg Oral Q4H PRN Allie Bossier, MD      . neomycin-bacitracin-polymyxin (NEOSPORIN) ointment    Topical BID Rigoberto Noel, MD      . ondansetron [facility]) tablet 4 mg  4 mg Oral Q6H PRN Allie Bossier, MD      . valproic acid (DEPAKENE) 250 MG/5ML syrup 250 mg  250 mg Oral 3 times per day Cherene Altes, MD   250 mg at 06/09/15 0613    Musculoskeletal: Strength & Muscle Tone: decreased Gait & Station: unable to stand Patient leans: N/A  Psychiatric Specialty Exam: Review of Systems:   Blood pressure 111/58, pulse 104, temperature 97.9 F (36.6 C), temperature source Oral, resp. rate 19, height _0  (1.753 m), weight 117 kg (257 lb 15 oz), SpO2 96 %.Body mass index is 38.07 kg/(m^2).  General Appearance: Guarded  Eye Contact::  Fair  Speech:  Clear and Coherent and Slow  Volume:  Decreased  Mood:  Depressed  Affect:  Constricted and Depressed  Thought Process:  Coherent and Goal Directed  Orientation:  Full (Time, Place, and Person)  Thought Content:  WDL and Wants to go home  Suicidal Thoughts:  No  Homicidal Thoughts:  No  Memory:  Immediate;   Fair Recent;   Fair  Judgement:  Fair  Insight:  Shallow  Psychomotor Activity:  Limited psychomotor activity  Concentration:  Fair  Recall:  [employer] of Knowledge:Fair  Language: Fair  Akathisia:  Negative  Handed:  Right  AIMS (if indicated):     Assets:  Communication Skills Desire for Improvement Financial Resources/Insurance Housing Leisure Time Resilience Social Support Talents/Skills Transportation  ADL's:  Impaired  Cognition: Impaired,  Severe  Sleep:      Treatment Plan Summary:  Patient admitted with major depressive disorder, intermittent explosive disorder, TBI, status post seizures, status post intubation/extubation and recent history of multiple acute psychiatric hospitalization at [facility] and also [place]. Reportedly noncompliant with medications while being in detention center/Jail, has history of multiple episodes of uncontrollable anger outbursts, intermittent explosive  behaviors, TBI, suicidal and homicidal threats in the past.    Neuroleptic malignant syndrome : Avoid psychotropic medication until completely recovered.  Continue Ativan 1-2 mg every 4 hours as needed for excessive anxiety and agitation   Amantadine 100 mg TID for parkinson symptoms Bromocriptine 2.5 mg QID for NMS as per neurologuy Comprehensive metabolic panel is WNL  Start Benzotropin 0.5 mg PO BID for EPS   Patient valproic acid level is less than 12 and at the same time he has no mood swings at this time Spoke with neurology and agree with discontinue Depakote  Appreciate psychiatric consultation and follow up as clinically required Please contact [phone] or [phone] if needs further assistance  Disposition: Patient does not meet criteria for inpatient psychiatric hospitalization as he has no active psychiatric issues and ADL's Patient benefit from PT and OT and possibly needed inpatient rehabilitation services when medically stable.    Durward Parcel., MD 06/09/2015 4:31 PM

## 2015-06-09 NOTE — Progress Notes (Addendum)
Nutrition Follow-up  DOCUMENTATION CODES:   Obesity unspecified  INTERVENTION:  -Ensure Enlive po TID, each supplement provides 350 kcal and 20 grams of protein -RD to continue to monitor   NUTRITION DIAGNOSIS:   Inadequate oral intake related to lethargy/confusion as evidenced by meal completion < 50%.  GOAL:   Patient will meet greater than or equal to 90% of their needs  MONITOR:   PO intake, Supplement acceptance, Labs, Weight trends, TF tolerance, I & O's  ASSESSMENT:   Pt with hx of GERD, [facility]. [facility] [place] [facility] where he refused to eat/drink or take meds. Pt noted to have seizure and [facility] seizure and was intubated.   Mr. Normand SloopDillard, doing much better; SLP has cleared him for an NDD2 diet. Pt complains that chopped foods are causing him to become short of breath and are hard to swallow. Was eating applesauce and pudding during time of visit. Prefers thinner liquids because he still has a slight "tickle" in his throat from where vent tube was. Denies nausea. Denies vomiting.  Pt's NG tube has been removed, no longer receiving tubefeeds; will likely transfer to a SNF.  Labs and Medications reviewed.  Diet Order:  DIET DYS 2 Room service appropriate?: Yes; Fluid consistency:: Thin  Skin:  Wound (see comment) (Neck with rash and blister)  Last BM:  3/9  Height:   Ht Readings from Last 1 Encounters:  06/03/15 5\' 9"  (1.753 m)    Weight:   Wt Readings from Last 1 Encounters:  06/09/15 257 lb 15 oz (117 kg)    Ideal Body Weight:  78.1 kg  BMI:  Body mass index is 38.07 kg/(m^2).  Estimated Nutritional Needs:   Kcal:  2300-2500  Protein:  125-145 grams  Fluid:  >2.3 L/day  EDUCATION NEEDS:   No education needs identified at this time  Dionne AnoWilliam M. Simeon Vera, MS, RD LDN After Hours/Weekend Pager [phone]

## 2015-06-10 NOTE — Progress Notes (Signed)
Interval History:                                                                                                                      Chase Hampton is an 25 y.o. male patient with  prolonged hospital stay significant psychiatric comorbidity , as described in the previous notes.  She continues to be bedridden, with rigidity in all extremities, drowsy and sleeping most of the time. There is a suspicion for neuroleptic migraine syndrome by a previous providers and has been started on medications including prompt return amantadine. He is also on baclofen. At this time no silk and agitation and behavioral problems noted by nursing staff.   Patient's mother reported that they have been working on obtaining placement for him and has contacted several nursing facilities who reportedly are unable to accept his  Medical insurance,  Per his mother.  Overall there hasn't been any significant change in his neurological status in the past few days.  Physical therapy has been following up with the patient.    Past Medical History: Past Medical History  Diagnosis Date  . GERD (gastroesophageal reflux disease)   . Anxiety   . Depression     Past Surgical History  Procedure Laterality Date  . No past surgeries      Family History: Family History  Problem Relation Age of Onset  . Heart disease Maternal Grandfather   . Diabetes Maternal Grandfather   . Heart disease Paternal Grandmother     Social History:   reports that he has quit smoking. His smoking use included Cigars. He has never used smokeless tobacco. He reports that he uses illicit drugs (Marijuana). He reports that he does not drink alcohol.  Allergies:  Allergies  Allergen Reactions  . Haldol [Haloperidol Lactate] Other (See Comments)    Angioedema [facility] lists no allergies as of 05/15/15)     Medications:                                                                                                                          Current facility-administered medications:  .  acetaminophen (TYLENOL) solution 500 mg, 500 mg, Per Tube, Q4H PRN, Lonia Blood, MD, 500 mg at 06/03/15 2232 .  amantadine (SYMMETREL) capsule 100 mg, 100 mg, Oral, TID, Nishant Dhungel, MD, 100 mg at 06/10/15 1628 .  antiseptic oral rinse (CPC / CETYLPYRIDINIUM CHLORIDE 0.05%) solution 7 mL, 7 mL, Mouth Rinse, q12n4p, Roselind Messier  Joseph ArtWoods, MD, 7 mL at 06/10/15 1636 .  baclofen (LIORESAL) tablet 10 mg, 10 mg, Oral, BID, Pakou Rainbow Daniel NonesNarayan Kaveer Casi Westerfeld, MD, 10 mg at 06/10/15 1231 .  baclofen (LIORESAL) tablet 20 mg, 20 mg, Oral, QHS, Ziyon Cedotal Daniel NonesNarayan Kaveer Lora Glomski, MD, 20 mg at 06/09/15 2212 .  benztropine (COGENTIN) tablet 0.5 mg, 0.5 mg, Oral, BID, Leata MouseJanardhana Jonnalagadda, MD, 0.5 mg at 06/10/15 1231 .  bromocriptine (PARLODEL) tablet 2.5 mg, 2.5 mg, Per Tube, QID, Lonia BloodJeffrey T McClung, MD, 2.5 mg at 06/10/15 1628 .  chlorhexidine (PERIDEX) 0.12 % solution 15 mL, 15 mL, Mouth Rinse, BID, Drema Dallasurtis J Woods, MD, 15 mL at 06/10/15 1232 .  enoxaparin (LOVENOX) injection 40 mg, 40 mg, Subcutaneous, Q24H, Lonia BloodJeffrey T McClung, MD, 40 mg at 06/09/15 2004 .  feeding supplement (ENSURE ENLIVE) (ENSURE ENLIVE) liquid 237 mL, 237 mL, Oral, TID BM, Drema Dallasurtis J Woods, MD, 237 mL at 06/10/15 1232 .  LORazepam (ATIVAN) tablet 1-2 mg, 1-2 mg, Oral, Q4H PRN, Drema Dallasurtis J Woods, MD .  neomycin-bacitracin-polymyxin (NEOSPORIN) ointment, , Topical, BID, Oretha Milchakesh Alva V, MD, Stopped at 06/10/15 1237 .  ondansetron (ZOFRAN) tablet 4 mg, 4 mg, Oral, Q6H PRN, Drema Dallasurtis J Woods, MD   Neurologic Examination:                                                                                                     Today's Vitals   06/10/15 0459 06/10/15 0500 06/10/15 0920 06/10/15 1434  BP: 107/59  108/54 93/52  Pulse: 85  74 86  Temp: 97.9 F (36.6 C)  99 F (37.2 C) 99 F (37.2 C)  TempSrc: Oral  Axillary Axillary  Resp: 18  16 16   Height:      Weight:  119.2 kg (262 lb 12.6 oz)     SpO2: 95%  99% 95%  PainSc:         patient sleeping, easily arousable to verbal command. He follows simple commands full extra ocular movements and  Symmetric smile. He is able to elevate all 4 extremities to command, continues to have severe significant increased tone in all extremities.  He has some mild postural  and kinetic finger tremors.  No resting tremor noted.   Lab Results: Basic Metabolic Panel:  Recent Labs Lab 06/09/15 0555  NA 141  K 4.0  CL 101  CO2 31  GLUCOSE 102*  BUN 10  CREATININE 0.78  CALCIUM 9.4    Liver Function Tests:  Recent Labs Lab 06/09/15 0555  AST 17  ALT 32  ALKPHOS 55  BILITOT 0.6  PROT 5.7*  ALBUMIN 2.5*   No results for input(s): LIPASE, AMYLASE in the last 168 hours. No results for input(s): AMMONIA in the last 168 hours.  CBC: No results for input(s): WBC, NEUTROABS, HGB, HCT, MCV, PLT in the last 168 hours.  Cardiac Enzymes: No results for input(s): CKTOTAL, CKMB, CKMBINDEX, TROPONINI in the last 168 hours.  Lipid Panel: No results for input(s): CHOL, TRIG, HDL, CHOLHDL, VLDL, LDLCALC in the last 168 hours.  CBG:  Recent Labs Lab 06/07/15 1134 06/07/15 1704  06/07/15 2010 06/07/15 2354 06/08/15 0352  GLUCAP 121* 70 76 85 96    Microbiology: Results for orders placed or performed during the hospital encounter of 05/15/15  MRSA PCR Screening     Status: None   Collection Time: 05/15/15  3:49 PM  Result Value Ref Range Status   MRSA by PCR NEGATIVE NEGATIVE Final    Comment:        The GeneXpert MRSA Assay (FDA approved for NASAL specimens only), is one component of a comprehensive MRSA colonization surveillance program. It is not intended to diagnose MRSA infection nor to guide or monitor treatment for MRSA infections.   Culture, blood (routine x 2)     Status: None   Collection Time: 05/15/15  9:53 PM  Result Value Ref Range Status   Specimen Description BLOOD RIGHT ANTECUBITAL  Final   Special  Requests BOTTLES DRAWN AEROBIC ONLY 5CC  Final   Culture NO GROWTH 5 DAYS  Final   Report Status 05/20/2015 FINAL  Final  Culture, blood (routine x 2)     Status: None   Collection Time: 05/15/15 10:06 PM  Result Value Ref Range Status   Specimen Description BLOOD RIGHT ANTECUBITAL  Final   Special Requests BOTTLES DRAWN AEROBIC ONLY 5CC  Final   Culture NO GROWTH 5 DAYS  Final   Report Status 05/20/2015 FINAL  Final  CSF culture     Status: None   Collection Time: 05/17/15  3:01 PM  Result Value Ref Range Status   Specimen Description CSF  Final   Special Requests NONE  Final   Gram Stain   Final    CYTOSPIN SMEAR WBC PRESENT, PREDOMINANTLY MONONUCLEAR NO ORGANISMS SEEN    Culture NO GROWTH 3 DAYS  Final   Report Status 05/20/2015 FINAL  Final  Culture, blood (Routine X 2) w Reflex to ID Panel     Status: None   Collection Time: 05/18/15 10:10 AM  Result Value Ref Range Status   Specimen Description BLOOD RIGHT HAND  Final   Special Requests IN PEDIATRIC BOTTLE 3CC  Final   Culture NO GROWTH 5 DAYS  Final   Report Status 05/23/2015 FINAL  Final  Culture, blood (Routine X 2) w Reflex to ID Panel     Status: None   Collection Time: 05/18/15 10:15 AM  Result Value Ref Range Status   Specimen Description BLOOD LEFT HAND  Final   Special Requests IN PEDIATRIC BOTTLE 3CC  Final   Culture NO GROWTH 5 DAYS  Final   Report Status 05/23/2015 FINAL  Final  Culture, blood (routine x 2)     Status: None   Collection Time: 05/26/15 10:34 PM  Result Value Ref Range Status   Specimen Description BLOOD LEFT HAND  Final   Special Requests BOTTLES DRAWN AEROBIC ONLY 5CC  Final   Culture NO GROWTH 5 DAYS  Final   Report Status 06/01/2015 FINAL  Final  Culture, blood (routine x 2)     Status: None   Collection Time: 05/26/15 10:44 PM  Result Value Ref Range Status   Specimen Description BLOOD RIGHT HAND  Final   Special Requests BOTTLES DRAWN AEROBIC AND ANAEROBIC 5CC   Final   Culture NO  GROWTH 5 DAYS  Final   Report Status 06/01/2015 FINAL  Final  Culture, Urine     Status: None   Collection Time: 05/27/15  4:45 AM  Result Value Ref Range Status   Specimen Description URINE, CATHETERIZED  Final  Special Requests NONE  Final   Culture NO GROWTH 1 DAY  Final   Report Status 05/28/2015 FINAL  Final  Culture, blood (Routine X 2) w Reflex to ID Panel     Status: None (Preliminary result)   Collection Time: 06/07/15  7:10 AM  Result Value Ref Range Status   Specimen Description BLOOD LEFT HAND  Final   Special Requests IN PEDIATRIC BOTTLE 3CC  Final   Culture NO GROWTH 3 DAYS  Final   Report Status PENDING  Incomplete  Culture, blood (Routine X 2) w Reflex to ID Panel     Status: None (Preliminary result)   Collection Time: 06/07/15  7:20 AM  Result Value Ref Range Status   Specimen Description BLOOD RIGHT ANTECUBITAL  Final   Special Requests BOTTLES DRAWN AEROBIC ONLY 8CC  Final   Culture NO GROWTH 2 DAYS  Final   Report Status PENDING  Incomplete    Imaging: No results found.  Assessment and plan:   Chase Hampton is an 25 y.o. male patient with prolonged hospital stay secondary to multiple medical and psychiatric comorbidities as described in the previous notes. Physical therapy is working with him. He continues to have significant increased tone in all extremities , the etiology appears to be extrapyramidal side effects secondary to antipsychotic medications.  He is currently on amantadine, bromocriptine, Cogentin and baclofen.  Tolerating well.   Family is working on obtaining placement in a skilled nursing facility.   Had an extensive discussion about his neurological assessment with the patient's parents at bedside.  Answered several of their questions to their satisfaction.  No other new  recommendations from neurology at this time.  We'll follow-up when necessary.  Please call if any further questions.

## 2015-06-10 NOTE — Progress Notes (Signed)
[facility] PROGRESS NOTE  Chase Hampton ZOX:[id number] DOB: 06/06/1990 DOA: 05/15/2015 PCP: [name], MD  Brief narrative 25 y/o Mw/ a Hx of Anxiety, Depression (prior [facility] hospitalizations) with recent admission to [facility] in [place] from 2/3 > 2/8 for major depressive disorder with homicidal / suicidal ideation after a break up with his fiancee. Due to restraining order violations, he was transferred from [facility] to [facility] jail were he had been on suicide watch. The patient reportedly refused to eat, drink or take his medications. He reportedly would only lay in the floor and urinate on himself. After multiple episodes of urinating on himself, he was asked to mop up the floor, which he was able to perform. Prior to discharge from [facility], notes reflect he had multiple episodes of physically aggressive behavior - including striking his own head on the seclusion room walls. At discharge he was to continue on Cymbalta, Depakote and Saphris. Prison staff report the patient refused all medications.   On 2/19, the patient was noted to have a seizure in jail. He was transported to [facility] ER via EMS. It is documented that he arrived to the ER with urine and feces all over him. There is question if patient was weaning off ativan?? Per [facility] notes but discharge note from [facility] does not include Rx for ativan. Room air saturations were 82%, he was treated with 100% NRB. He was treated with  IV versed per EMS en route. The patient had a second seizure in ER and was ultimately intubated. He was transferred to [facility] ICU for further evaluation. The patient arrived to [place] on propofol, mechanically intubated.   Significant hospital course: 2/19 Admit from jail with seizure, intubated  2/20 LP > negative for infection  2/20 EEG > No active seizures.  2/21 Repeat EEG > no seizure activity 2/21 TEE normal  2/22 focal seizures mouth 2/27 MRI brain wo contrast normal 2/27 MRI T-spine/C-spine  - Edema posterior paraspinal musculature - minimal amount of retropharyngeal edema - Nonspecific posterior paravertebral edema extends into the upper thoracic region - Multiple bulging disks with cord flattening  -3/14: NG removed, referral sent out for SNF placement  Assessment/Plan: FUO (103.8 05/26/15)/ Altered mental status / ?NMS -NMS?- no evidence of infection  -Neurology was following patient was in the hospital. Currently on bromocriptine and amantadine. -No longer requiring a cooling blanket -avoid serotonin active agents or antipsychotics . -Will need ongoing PT - improvement in mentation today. More alert and responsive  Staph hominis bacteremia 2/2 + blood cultures upon admission to [facility] ED 05/15/2015  - TEE negative and LP benign - felt to be a genuine bacteremia per ID  Completed 2 weeks course of IV antibiotics on 05/29/2015, as per recommendations. -Blood culture repeated today to ensure clearance. (repeast Blood cx still negative.)  Acute Hypoxic Respiratory Failure  - Resolved   Hypernatremia  -due to hypovolemia - resolved   Hypotension  -resolved  Rhabdomyolysis\ Achy on admission >7500. Received IV hydration. Now resolved.  Bipolar D/O w/ Depression > Catatonia  -Appreciate psychiatry recommendations. Unsuspected NMS recommended to avoid antipsychotics. On scheduled low-dose Klonopin and when necessary Ativan for agitation. Continue Depakote.   DVT prophylaxis: subontinuous lovenox   Diet: On NG tube feeds. SLP has cleared for dysphagia level III diet. NG removed today. Encouraged by mouth intake.  Code Status: FULL code Family Communication: Parents at bedside Disposition Plan: Needs skilled nursing facility. Will need outpatient psychiatry referral.  Consultants: Psychiatry PCCM Neurology  ID  Antibiotics: Rocephin 2/19 > 2/22 Vanco 2/20 > 2/22 Cefazolin 2/22 > 3/5   HPI/Subjective: Patient has no new complaints.  Objective: Filed  Vitals:   06/10/15 0459 06/10/15 0920  BP: 107/59 108/54  Pulse: 85 74  Temp: 97.9 F (36.6 C) 99 F (37.2 C)  Resp: 18 16    Intake/Output Summary (Last 24 hours) at 06/10/15 1337 Last data filed at 06/10/15 0909  Gross per 24 hour  Intake      0 ml  Output   1150 ml  Net  -1150 ml   Filed Weights   06/08/15 0525 06/09/15 0408 06/10/15 0500  Weight: 121.7 kg (268 lb 4.8 oz) 117 kg (257 lb 15 oz) 119.2 kg (262 lb 12.6 oz)    Exam:   General: awake and in NAD, does not respond to examiner.  HEENT: No pallor, moist mucosa  Cardiovascular: Normal S1 and S2, no murmurs rub or gallop  Respiratory: Clear bilaterally, no added sounds  Abdomen: Soft, nondistended, nontender, bowel sounds present  Musculoskeletal: Warm, no edema  CNS: no facial asymmetry  Data Reviewed: Basic Metabolic Panel:  Recent Labs Lab 06/09/15 0555  NA 141  K 4.0  CL 101  CO2 31  GLUCOSE 102*  BUN 10  CREATININE 0.78  CALCIUM 9.4   Liver Function Tests:  Recent Labs Lab 06/09/15 0555  AST 17  ALT 32  ALKPHOS 55  BILITOT 0.6  PROT 5.7*  ALBUMIN 2.5*   No results for input(s): LIPASE, AMYLASE in the last 168 hours. No results for input(s): AMMONIA in the last 168 hours. CBC: No results for input(s): WBC, NEUTROABS, HGB, HCT, MCV, PLT in the last 168 hours. Cardiac Enzymes: No results for input(s): CKTOTAL, CKMB, CKMBINDEX, TROPONINI in the last 168 hours. BNP (last 3 results) No results for input(s): BNP in the last 8760 hours.  ProBNP (last 3 results) No results for input(s): PROBNP in the last 8760 hours.  CBG:  Recent Labs Lab 06/07/15 1134 06/07/15 1704 06/07/15 2010 06/07/15 2354 06/08/15 0352  GLUCAP 121* 70 76 85 96    Recent Results (from the past 240 hour(s))  Culture, blood (Routine X 2) w Reflex to ID Panel     Status: None (Preliminary result)   Collection Time: 06/07/15  7:10 AM  Result Value Ref Range Status   Specimen Description BLOOD LEFT HAND   Final   Special Requests IN PEDIATRIC BOTTLE 3CC  Final   Culture NO GROWTH 2 DAYS  Final   Report Status PENDING  Incomplete  Culture, blood (Routine X 2) w Reflex to ID Panel     Status: None (Preliminary result)   Collection Time: 06/07/15  7:20 AM  Result Value Ref Range Status   Specimen Description BLOOD RIGHT ANTECUBITAL  Final   Special Requests BOTTLES DRAWN AEROBIC ONLY 8CC  Final   Culture NO GROWTH 1 DAY  Final   Report Status PENDING  Incomplete     Studies: No results found.  Scheduled Meds: . amantadine  100 mg Oral TID  . antiseptic oral rinse  7 mL Mouth Rinse q12n4p  . baclofen  10 mg Oral BID  . baclofen  20 mg Oral QHS  . benztropine  0.5 mg Oral BID  . bromocriptine  2.5 mg Per Tube QID  . chlorhexidine  15 mL Mouth Rinse BID  . enoxaparin (LOVENOX) injection  40 mg Subcutaneous Q24H  . feeding supplement (ENSURE ENLIVE)  237 mL Oral TID BM  .  neomycin-bacitracin-polymyxin   Topical BID   Continuous Infusions:    Time spent:25 minutes  Penny Pia  [facility] Pager [phone] If 7PM-7AM, please contact night-coverage at [email or web address], password [facility] 06/10/2015, 1:37 PM  LOS: 26 days

## 2015-06-10 NOTE — Care Management Note (Signed)
Case Management Note  Patient Details  Name: Talitha GivensStewart R XXXDillard MRN: [id number] Date of Birth: 08/19/1990  Subjective/Objective:                    Action/Plan: Plan continues to be to discharge to SNF when bed available. CM continuing to follow for d/c needs.   Expected Discharge Date:                  [facility]  In-House Referral:  Clinical Social Work  Discharge planning Services  CM Consult  Post Acute Care Choice:    Choice offered to:     DME Arranged:    DME Agency:     HH Arranged:    HH Agency:     Status of Service:  Completed, signed off  Medicare Important Message Given:    Date Medicare IM Given:    Medicare IM give by:    Date Additional Medicare IM Given:    Additional Medicare Important Message give by:     If discussed at Long Length of Stay Meetings, dates discussed:    Additional Comments:  Kermit BaloKelli F Bufford Helms, RN 06/10/2015, 2:37 PM

## 2015-06-10 NOTE — Clinical Social Work Note (Signed)
Clinical Social Worker had lengthy conversation with patient's mother, Orlie PollenLynne yesterday, 3/16 in regards to discharge planning. Pt's mother continues to believe that currently skilled nursing placement is not the best option for the patient at this time. Pt's mother requesting a Neuro Rehab center, which is not an option at this time. CSW paged Attending MD to contact pt's mother with updates and medical expectations.   CSW has received approval from Medical Director to send SNF [facility]. [facility] has declined patient. CSW has contacted Wellsite geologistMedical Director and left a message with updates.   CSW will continue to follow pt and pt's family for continued support and to facilitate pt's discharge needs once SNF bed is available.   Derenda FennelBashira Faythe Heitzenrater, MSW, LCSWA [phone] 06/10/2015 11:21 AM

## 2015-06-10 NOTE — Progress Notes (Signed)
Speech Language Pathology Treatment: Dysphagia  Patient Details Name: Chase Hampton MRN: [id number] DOB: 09/15/1990 Today's Date: 06/10/2015 Time: [id number] SLP Time Calculation (min) (ACUTE ONLY): 15 min  Assessment / Plan / Recommendation Clinical Impression  Pt has improved oral opening today for better bolus acceptance. Oral transit time is improved, and pt was able to eat an entire container of applesauce in an appropriate amount of time. No overt s/s of aspiration with any consistency, although Mod cues still provided for slower pacing with water. Despite improvements seen today, would favor continuing current diet for softer consistencies to try to maximize PO intake. Will continue to follow.   HPI HPI: Pt with hx of GERD, [facility]. [facility] [place] [facility] where he refused to eat/drink or take meds. Pt noted to have seizure and [facility] where he suffered another seizure and was intubated 2/19-2/28. Pt has remained lethargic since then. CT spine shows Edema posterior paraspinal musculature - minimal amount of retropharyngeal edema.       SLP Plan  Continue with current plan of care     Recommendations  Diet recommendations: Dysphagia 2 (fine chop);Thin liquid Liquids provided via: Cup;Straw Medication Administration: Crushed with puree Supervision: Patient able to self feed;Full supervision/cueing for compensatory strategies Compensations: Follow solids with liquid;Small sips/bites;Slow rate;Lingual sweep for clearance of pocketing Postural Changes and/or Swallow Maneuvers: Seated upright 90 degrees             Oral Care Recommendations: Oral care BID Follow up Recommendations: Skilled Nursing facility Plan: Continue with current plan of care     GO               Chase Hampton, M.A. CCC-SLP [phone]  Chase Hampton, Chase Hampton 06/10/2015, 12:09 PM

## 2015-06-10 NOTE — Progress Notes (Signed)
Physical Therapy Treatment Patient Details Name: Chase Hampton MRN: [id number] DOB: 07/08/1990 Today's Date: 06/10/2015    History of Present Illness Pt is a 25 y/o M admitted from jail where he had multiple episodes of urinating on himself and physically aggressive behavior, including striking his own head on the wall.  Pt had another seizure in the ER and was ultimately intubated.  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  His PMH includes anxiety and depression.    PT Comments    Pt refused to sit in chair reports it hurts his back and bottom.  Pt performed standing edge of bed with side steps to head of bed.  Pt would benefit from gait goals to advance mobility as pt is tolerating more activity with decreased assist.     Follow Up Recommendations  CIR;Supervision/Assistance - 24 hour     Equipment Recommendations  Wheelchair (measurements PT);Wheelchair cushion (measurements PT)    Recommendations for Other Services       Precautions / Restrictions Precautions Precautions: Fall Precaution Comments: seizure precautions; watch HR and RR Restrictions Weight Bearing Restrictions: No    Mobility  Bed Mobility Overal bed mobility: Needs Assistance Bed Mobility: Supine to Sit     Supine to sit: Mod assist;HOB elevated (Pt able to performed trunk flexion into seated position unassisted but remains to require mod assist for scooting to edge of bed.  ) Sit to supine: Max assist;+2 for physical assistance (Pt required increased assist against gravity for transfer back to bed.  Pt required cues for pulling up into supine position with use of rail.  )   General bed mobility comments: Pt presents with improved carryover with assist decreased and ability to follow commands to assist with movements.  Pt extremely motivated.    Transfers Overall transfer level: Needs assistance Equipment used: Rolling walker (2 wheeled) Transfers: Sit  to/from Stand Sit to Stand: Mod assist;+2 physical assistance (able to maintain standing with max assist +1 while nurse adjusted pads in bed.  )         General transfer comment: Pt required mod assist +2 and able to take side steps edge of bed to assist in moving higher up in bed.  Pt pulls on RW for leverage despite cues.  Pt able to follow commands to reach back for seated surface during descent.    Ambulation/Gait Ambulation/Gait assistance:  (Will inform supervising PT to add gait goals next visit.  )               Stairs            Wheelchair Mobility    Modified Rankin (Stroke Patients Only)       Balance     Sitting balance-Leahy Scale: Fair       Standing balance-Leahy Scale: Poor                      Cognition Arousal/Alertness: Awake/alert Behavior During Therapy: Flat affect Overall Cognitive Status: Within Functional Limits for tasks assessed                      Exercises      General Comments        Pertinent Vitals/Pain Pain Assessment: Faces Pain Score: 0-No pain    Home Living                      Prior Function  PT Goals (current goals can now be found in the care plan section) Acute Rehab PT Goals Patient Stated Goal: get up to chair Potential to Achieve Goals: Good Progress towards PT goals: Progressing toward goals    Frequency  Min 3X/week    PT Plan Current plan remains appropriate    Co-evaluation             End of Session Equipment Utilized During Treatment: Gait belt Activity Tolerance: Patient tolerated treatment well Patient left: in bed;with call bell/phone within reach;with family/visitor present     Time: 9604-5409 PT Time Calculation (min) (ACUTE ONLY): 27 min  Charges:  $Therapeutic Activity: 23-37 mins                    G Codes:      Chase Hampton 2015/07/03, 4:03 PM  Chase Hampton, PTA pager [phone]

## 2015-06-11 NOTE — Progress Notes (Signed)
[facility] PROGRESS NOTE  Chase Hampton NFA:[id number] DOB: [birth year] DOA: 05/15/2015 PCP: [name], MD  Brief narrative 25 y/o Mw/ a Hx of Anxiety, Depression (prior [facility] hospitalizations) with recent admission to [facility] in [place] from 2/3 > 2/8 for major depressive disorder with homicidal / suicidal ideation after a break up with his fiancee. Due to restraining order violations, he was transferred from [facility] to [facility] were he had been on suicide watch. The patient reportedly refused to eat, drink or take his medications. He reportedly would only lay in the floor and urinate on himself. After multiple episodes of urinating on himself, he was asked to mop up the floor, which he was able to perform. Prior to discharge from [facility], notes reflect he had multiple episodes of physically aggressive behavior - including striking his own head on the seclusion room walls. At discharge he was to continue on Cymbalta, Depakote and Saphris. Prison staff report the patient refused all medications.   On 2/19, the patient was noted to have a seizure in jail. He was transported to [facility] ER via EMS. It is documented that he arrived to the ER with urine and feces all over him. There is question if patient was weaning off ativan?? Per [facility] notes but discharge note from [facility] does not include Rx for ativan. Room air saturations were 82%, he was treated with 100% NRB. He was treated with  IV versed per EMS en route. The patient had a second seizure in ER and was ultimately intubated. He was transferred to [facility] ICU for further evaluation. The patient arrived to [facility] on propofol, mechanically intubated.   Significant hospital course: 2/19 Admit from jail with seizure, intubated  2/20 LP > negative for infection  2/20 EEG > No active seizures.  2/21 Repeat EEG > no seizure activity 2/21 TEE normal  2/22 focal seizures mouth 2/27 MRI brain wo contrast normal 2/27 MRI T-spine/C-spine  - Edema posterior paraspinal musculature - minimal amount of retropharyngeal edema - Nonspecific posterior paravertebral edema extends into the upper thoracic region - Multiple bulging disks with cord flattening  -3/14: NG removed, referral sent out for SNF placement  Assessment/Plan: FUO (103.8 05/26/15)/ Altered mental status / ?NMS -NMS?- no evidence of infection  -Neurology was following patient was in the hospital. Currently on bromocriptine and amantadine. -No longer requiring a cooling blanket -avoid serotonin active agents or antipsychotics . - No fevers - improvement in mentation today. More alert and responsive - PT recommending CIR consult  Staph hominis bacteremia 2/2 + blood cultures upon admission to [facility] ED 05/15/2015  - TEE negative and LP benign - felt to be a genuine bacteremia per ID  Completed 2 weeks course of IV antibiotics on 05/29/2015, as per recommendations. - Blood culture repeated today to ensure clearance. (repeast Blood cx still negative.)  Acute Hypoxic Respiratory Failure  - Resolved   Hypernatremia  - Due to hypovolemia - resolved   Hypotension  -resolved  Rhabdomyolysis Achy on admission >7500. Received IV hydration. Now resolved.  Bipolar D/O w/ Depression > Catatonia  -Appreciate psychiatry recommendations. Suspected NMS recommended to avoid antipsychotics. On scheduled low-dose Klonopin and when necessary Ativan for agitation. Continue Depakote.  DVT prophylaxis: subontinuous lovenox   Diet: On NG tube feeds. SLP has cleared for dysphagia level III diet. NG removed today. Encouraged by mouth intake.  Code Status: FULL code Family Communication: Parents at bedside Disposition Plan: Needs skilled nursing facility. Will need outpatient psychiatry referral.  Consultants:  Psychiatry PCCM Neurology  ID  Antibiotics: Rocephin 2/19 > 2/22 Vanco 2/20 > 2/22 Cefazolin 2/22 > 3/5   HPI/Subjective: Patient has no new complaints. No  acute issues reported overnight.  Objective: Filed Vitals:   06/11/15 0649 06/11/15 1009  BP: 109/60 114/67  Pulse: 80 93  Temp: 98.8 F (37.1 C) 99 F (37.2 C)  Resp: 18 15    Intake/Output Summary (Last 24 hours) at 06/11/15 1425 Last data filed at 06/11/15 1013  Gross per 24 hour  Intake    236 ml  Output   2525 ml  Net  -2289 ml   Filed Weights   06/08/15 0525 06/09/15 0408 06/10/15 0500  Weight: 121.7 kg (268 lb 4.8 oz) 117 kg (257 lb 15 oz) 119.2 kg (262 lb 12.6 oz)    Exam:   General: awake and in NAD, does not respond to examiner.  HEENT: No pallor, moist mucosa  Cardiovascular: Normal S1 and S2, no murmurs rub or gallop  Respiratory: Clear bilaterally, no added sounds  Abdomen: Soft, nondistended, nontender, bowel sounds present  Musculoskeletal: Warm, no edema  CNS: no facial asymmetry  Data Reviewed: Basic Metabolic Panel:  Recent Labs Lab 06/09/15 0555  NA 141  K 4.0  CL 101  CO2 31  GLUCOSE 102*  BUN 10  CREATININE 0.78  CALCIUM 9.4   Liver Function Tests:  Recent Labs Lab 06/09/15 0555  AST 17  ALT 32  ALKPHOS 55  BILITOT 0.6  PROT 5.7*  ALBUMIN 2.5*   No results for input(s): LIPASE, AMYLASE in the last 168 hours. No results for input(s): AMMONIA in the last 168 hours. CBC: No results for input(s): WBC, NEUTROABS, HGB, HCT, MCV, PLT in the last 168 hours. Cardiac Enzymes: No results for input(s): CKTOTAL, CKMB, CKMBINDEX, TROPONINI in the last 168 hours. BNP (last 3 results) No results for input(s): BNP in the last 8760 hours.  ProBNP (last 3 results) No results for input(s): PROBNP in the last 8760 hours.  CBG:  Recent Labs Lab 06/07/15 1134 06/07/15 1704 06/07/15 2010 06/07/15 2354 06/08/15 0352  GLUCAP 121* 70 76 85 96    Recent Results (from the past 240 hour(s))  Culture, blood (Routine X 2) w Reflex to ID Panel     Status: None (Preliminary result)   Collection Time: 06/07/15  7:10 AM  Result Value  Ref Range Status   Specimen Description BLOOD LEFT HAND  Final   Special Requests IN PEDIATRIC BOTTLE 3CC  Final   Culture NO GROWTH 3 DAYS  Final   Report Status PENDING  Incomplete  Culture, blood (Routine X 2) w Reflex to ID Panel     Status: None (Preliminary result)   Collection Time: 06/07/15  7:20 AM  Result Value Ref Range Status   Specimen Description BLOOD RIGHT ANTECUBITAL  Final   Special Requests BOTTLES DRAWN AEROBIC ONLY 8CC  Final   Culture NO GROWTH 2 DAYS  Final   Report Status PENDING  Incomplete     Studies: No results found.  Scheduled Meds: . amantadine  100 mg Oral TID  . antiseptic oral rinse  7 mL Mouth Rinse q12n4p  . baclofen  10 mg Oral BID  . baclofen  20 mg Oral QHS  . benztropine  0.5 mg Oral BID  . bromocriptine  2.5 mg Per Tube QID  . chlorhexidine  15 mL Mouth Rinse BID  . enoxaparin (LOVENOX) injection  40 mg Subcutaneous Q24H  . feeding supplement (ENSURE  ENLIVE)  237 mL Oral TID BM  . neomycin-bacitracin-polymyxin   Topical BID   Continuous Infusions:    Time spent:25 minutes  Penny PiaVEGA, Lysha Schrade  [facility] Pager [phone] If 7PM-7AM, please contact night-coverage at [email or web address], password [email or web address] 06/11/2015, 2:25 PM  LOS: 27 days

## 2015-06-12 LAB — CULTURE, BLOOD (ROUTINE X 2): Culture: NO GROWTH

## 2015-06-12 NOTE — Progress Notes (Signed)
[facility] PROGRESS NOTE  Talitha GivensStewart R XXXDillard WGN:[id number] DOB: 07/23/1990 DOA: 05/15/2015 PCP: Fidel LevyJames Hawkins Jr, MD  Brief narrative 25 y/o Mw/ a Hx of Anxiety, Depression ([facility]) with [facility] [place] from 2/3 > 2/8 for major depressive disorder with homicidal / suicidal ideation after a break up with his fiancee. Due to restraining order violations, [facility] were he had been on suicide watch. The patient reportedly refused to eat, drink or take his medications. He reportedly would only lay in the floor and urinate on himself. After multiple episodes of urinating on himself, he was asked to mop up the floor, which he was able to perform. Prior to discharge [facility], notes reflect he had multiple episodes of physically aggressive behavior - including striking his own head on the seclusion room walls. At discharge he was to continue on Cymbalta, Depakote and Saphris. Prison staff report the patient refused all medications.   On 2/19, the patient was noted to have a seizure in jail. [facility]. It is documented that he arrived to the ER with urine and feces all over him. There is question if patient was weaning off ativan?? [facility] but [facility] does not [facility] Rx for at[facility]. Room air saturations were 82%, he was treated with 100% NRB. He was treated with 4mg  IV versed per EMS en route. The patient had a second seizure in ER and was ultimately intubated. [facility]. The [facility], mechanically intubated.   Significant hospital course: 2/19 Admit from jail with seizure, intubated  2/20 LP > negative for infection  2/20 EEG > No active seizures.  2/21 Repeat EEG > no seizure activity 2/21 TEE normal  2/22 focal seizures mouth 2/27 MRI brain wo contrast normal 2/27 MRI T-spine/C-spine  - Edema posterior paraspinal musculature - minimal amount of retropharyngeal edema - Nonspecific posterior paravertebral edema extends into the upper thoracic region - Multiple bulging disks with cord flattening  -3/14: NG removed, referral sent out for SNF placement  Assessment/Plan: FUO (103.8 05/26/15)/ Altered mental status / ?NMS -NMS?- no evidence of infection  -Neurology was following patient was in the hospital. Currently on bromocriptine and amantadine. -No longer requiring a cooling blanket -avoid serotonin active agents or antipsychotics . - No fevers - PT recommending CIR consult, consult placed.  Staph hominis bacteremia 2/2 + blood cultures [facility]/15/2015  - TEE negative and LP benign - felt to be a genuine bacteremia per ID  Completed 2 weeks course of IV antibiotics on 05/29/2015, as per recommendations. - Blood culture repeated today to ensure clearance. (repeast Blood cx still negative.)  Acute Hypoxic Respiratory Failure  - Resolved   Hypernatremia  - Due to hypovolemia - resolved   Hypotension  -resolved  Rhabdomyolysis Achy on admission >7500. Received IV hydration. Now resolved.  Bipolar D/O w/ Depression > Catatonia  -Appreciate psychiatry recommendations. Suspected NMS recommended to avoid antipsychotics. On scheduled low-dose Klonopin and when necessary Ativan for agitation. Continue Depakote.  DVT prophylaxis: subontinuous lovenox   Diet: On NG tube feeds. SLP has cleared for dysphagia level III diet. NG removed today. Encouraged by mouth intake.  Code Status: FULL code Family Communication: Parents at bedside Disposition Plan: consulted CIR, patient currently undergoing disposition planning  Consultants: Psychiatry PCCM Neurology  ID  Antibiotics: Rocephin 2/19 >  2/22 Vanco 2/20 > 2/22 Cefazolin 2/22 > 3/5   HPI/Subjective: Patient has no new complaints. No acute issues reported overnight.  Objective: Filed  Vitals:   06/12/15 1044 06/12/15 1400  BP: 114/70 101/66  Pulse: 86 84  Temp: 98.4 F (36.9 C) 98.2 F (36.8 C)  Resp: 16 16    Intake/Output Summary (Last 24 hours) at 06/12/15 1634 Last data filed at 06/12/15 1300  Gross per 24 hour  Intake    720 ml  Output   1300 ml  Net   -580 ml   Filed Weights   06/08/15 0525 06/09/15 0408 06/10/15 0500  Weight: 121.7 kg (268 lb 4.8 oz) 117 kg (257 lb 15 oz) 119.2 kg (262 lb 12.6 oz)    Exam:   General: awake and in NAD, does not respond to examiner.  HEENT: No pallor, moist mucosa  Cardiovascular: Normal S1 and S2, no murmurs rub or gallop  Respiratory: Clear bilaterally, no added sounds  Abdomen: Soft, nondistended, nontender, bowel sounds present  Musculoskeletal: Warm, no edema  CNS: no facial asymmetry  Data Reviewed: Basic Metabolic Panel:  Recent Labs Lab 06/09/15 0555  NA 141  K 4.0  CL 101  CO2 31  GLUCOSE 102*  BUN 10  CREATININE 0.78  CALCIUM 9.4   Liver Function Tests:  Recent Labs Lab 06/09/15 0555  AST 17  ALT 32  ALKPHOS 55  BILITOT 0.6  PROT 5.7*  ALBUMIN 2.5*   No results for input(s): LIPASE, AMYLASE in the last 168 hours. No results for input(s): AMMONIA in the last 168 hours. CBC: No results for input(s): WBC, NEUTROABS, HGB, HCT, MCV, PLT in the last 168 hours. Cardiac Enzymes: No results for input(s): CKTOTAL, CKMB, CKMBINDEX, TROPONINI in the last 168 hours. BNP (last 3 results) No results for input(s): BNP in the last 8760 hours.  ProBNP (last 3 results) No results for input(s): PROBNP in the last 8760 hours.  CBG:  Recent Labs Lab 06/07/15 1134 06/07/15 1704 06/07/15 2010 06/07/15 2354 06/08/15 0352  GLUCAP 121* 70 76 85 96    Recent Results (from the past 240 hour(s))  Culture, blood (Routine X 2) w Reflex to ID Panel     Status: None   Collection Time: 06/07/15  7:10 AM  Result Value Ref Range Status   Specimen Description BLOOD LEFT HAND  Final   Special  Requests IN PEDIATRIC BOTTLE 3CC  Final   Culture NO GROWTH 5 DAYS  Final   Report Status 06/12/2015 FINAL  Final  Culture, blood (Routine X 2) w Reflex to ID Panel     Status: None (Preliminary result)   Collection Time: 06/07/15  7:20 AM  Result Value Ref Range Status   Specimen Description BLOOD RIGHT ANTECUBITAL  Final   Special Requests BOTTLES DRAWN AEROBIC ONLY 8CC  Final   Culture NO GROWTH 4 DAYS  Final   Report Status PENDING  Incomplete     Studies: No results found.  Scheduled Meds: . amantadine  100 mg Oral TID  . antiseptic oral rinse  7 mL Mouth Rinse q12n4p  . baclofen  10 mg Oral BID  . baclofen  20 mg Oral QHS  . benztropine  0.5 mg Oral BID  . bromocriptine  2.5 mg Per Tube QID  . chlorhexidine  15 mL Mouth Rinse BID  . enoxaparin (LOVENOX) injection  40 mg Subcutaneous Q24H  . feeding supplement (ENSURE ENLIVE)  237 mL Oral TID BM  . neomycin-bacitracin-polymyxin  Topical BID   Continuous Infusions:    Time spent:25 minutes  Penny Pia  [facility] Pager [phone] If 7PM-7AM, please contact night-coverage at [email or web address], password [facility] 06/12/2015, 4:34 PM  LOS: 28 days

## 2015-06-12 NOTE — Progress Notes (Signed)
Pt's family requested to have him on a bedside commode, told them we'll need to check for safety and  get some more staff assistance. Returned with 2  Staff members and and commode to assist. He was transferred to the commode with his parent's assistance holding him up-both lifting from underarm, legs were unsteady on the floor, unable to keep feet still to the floor. Parents placed pt back in bed. Parents insists that patient can continue to use bedside commode--- **Observation is that patient needs maximum assist against gravity**

## 2015-06-13 DIAGNOSIS — R1310 Dysphagia, unspecified: Secondary | ICD-10-CM

## 2015-06-13 DIAGNOSIS — R159 Full incontinence of feces: Secondary | ICD-10-CM | POA: Insufficient documentation

## 2015-06-13 DIAGNOSIS — R32 Unspecified urinary incontinence: Secondary | ICD-10-CM

## 2015-06-13 DIAGNOSIS — D62 Acute posthemorrhagic anemia: Secondary | ICD-10-CM

## 2015-06-13 DIAGNOSIS — M6282 Rhabdomyolysis: Secondary | ICD-10-CM | POA: Insufficient documentation

## 2015-06-13 DIAGNOSIS — I10 Essential (primary) hypertension: Secondary | ICD-10-CM

## 2015-06-13 LAB — CULTURE, BLOOD (ROUTINE X 2): Culture: NO GROWTH

## 2015-06-13 MED ORDER — BENZTROPINE MESYLATE 0.5 MG PO TABS
1.0000 mg | ORAL_TABLET | Freq: Two times a day (BID) | ORAL | Status: DC
  Administered 2015-06-13 – 2015-06-14 (×2): 1 mg via ORAL
  Filled 2015-06-13 (×2): qty 2

## 2015-06-13 MED ORDER — BISACODYL 10 MG RE SUPP
10.0000 mg | Freq: Every day | RECTAL | Status: DC | PRN
  Administered 2015-06-13: 10 mg via RECTAL
  Filled 2015-06-13: qty 1

## 2015-06-13 NOTE — Progress Notes (Signed)
CM has met with the patient and his parents multiple times today with CSW. CM went over each time that the patient is medically stable and his needs can be met at a lower level of care. The family has been in communication with [place] and are interested in having the patient go to a psychiatric rehab at [facility] However, the father does not know the name of the rehab and [employer] CM was not familiar with the rehab. Patient has been faxed out per CSW to multiple SNF rehabs without any offers. CM and CSW spoke with [employer] CM (Diane) extensively about the patient and his needs at d/c. Currently there are no options for the patient at discharge except home with family. 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 They also state they don't feel they can afford private duty sitters. CM has informed them that [facility] services can be set up along with DME needed for home. Parents also concerned about patient needing a monitoring bracelet at d/c per his bond requirements versus going back to jail. CM attempted to call Lang Snow with security for more information, message left.  Information provided to Dr Reynaldo Minium, [facility] with CSW and Dr Wendee Beavers. CM will continue to follow in the am.

## 2015-06-13 NOTE — Clinical Social Work Note (Signed)
Clinical Social Worker met with patient and pt's parents several times today with RNCM in regards to discharge planning. CSW related that currently patient does not have bed offers for SNF placement. CSW explained barriers to discharging to SNF (psychiatric and criminal history). Pt's mother reported that patient has not been charged for any crimes.   RNCM presented the option to family of patient discharging home with caregivers. 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 Family requesting [facility] neuro [facility] and has indicated that [place] case manager was facilitating that transition.   Pt's parents also reported that patient could not return home due to needing an ankle monitor. RNCM has contacted Systems analyst and left a message surrounding ankle monitor. RNCM presented home health and equipment options. Pt's mother became tearful when discussing pt's return home and how caring for patient at home would impact their business. Pt's father stated: "I guess we will lose everything; our house, business and end up on welfare".   CSW and RNCM has had extensive conservations with [employer] case managers. Both CSW/RNCM has shared with [employer] case managers that patient is medically stable for discharge and that currently inpatient psych recommendation is not appropriate at this time.   Pt lying in bed during meeting with eyes open. Pt's parents supportive and involved in care here at hospital however not pleased with discharge plan for returning home.   CSW paged Attending MD, CSW and RNCM discussed plan for home health.   CSW Print production planner aware.   CSW will continue to follow patient and pt's family for continued support and to facilitate pt's discharge needs once medically stable.   Glendon Axe, MSW, LCSWA [phone] 06/13/2015 6:47 PM

## 2015-06-13 NOTE — Consult Note (Signed)
[facility] Face-to-Face Psychiatry Consult follow-up  Reason for Consult:  Major depression, TBI, noncompliant while in detention, benzo withdrawal seizures and NMS Referring Physician:  Dr. Tyson Alias Patient Identification: Chase Hampton MRN:  [id number] Principal Diagnosis: Neuroleptic malignant syndrome Diagnosis:   Patient Active Problem List   Diagnosis Date Noted  . Benign essential HTN [I10]   . Non-traumatic rhabdomyolysis [M62.82]   . Dysphagia [R13.10]   . Acute blood loss anemia [D62]   . Bowel and bladder incontinence [R32, R15.9]   . Hypotension [I95.9]   . Weakness [R53.1]   . Rigidity [R29.898]   . Muscle spasm [M62.838]   . Acute respiratory failure with hypoxia and hypercapnia (HCC) [J96.01, J96.02]   . Encephalopathy acute [G93.40]   . Catatonia (HCC) [F06.1]   . FUO (fever of unknown origin) [R50.9]   . Depression [F32.9]   . Neuroleptic malignant syndrome [G21.0]   . Seizure (HCC) [R56.9]   . Encounter for orogastric (OG) tube placement [Z46.59]   . Acute respiratory failure (HCC) [J96.00]   . Coagulase negative Staphylococcus bacteremia [R78.81]   . Bipolar I disorder, most recent episode depressed (HCC) [F31.30]   . Suicidal ideation [R45.851]   . Homicidal ideation [R45.850]   . 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 Acute encephalopathy [G93.40]   . Seizures (HCC) [R56.9]   . Status epilepticus (HCC) [G40.901] 05/15/2015  . Major depressive disorder, recurrent episode, moderate (HCC) [F33.1] 04/30/2015    Total Time spent with patient: 30 minutes  Subjective:   Chase Hampton is a 25 y.o. male patient admitted with status post seizures and depression.  HPI:   Chase Hampton is a 25 years old single male admitted to [facility] medical intensive care unit for status post benzodiazepine withdrawal seizures and increased symptoms of bipolar depression, anxiety, anger outbursts and bizarre behaviors. Reviewed neurology consultation and their follow-ups  regarding seizures and possible catatonia. Patient is also has multiple legal charges regarding violation of restraining order and multiple acute psychiatric hospitalization since November 2016 for depression, anger outbursts, suicidal and homicidal ideation, intention and plans. Review of medical records indicated he was hospitalized at least 3 times at [facility] both voluntarily and involuntarily and also in [place]. Patient father reported that he has a severe allergic reaction to Haldol given during the second hospitalization. Patient was initially brought to the [facility] from detention center and then transferred to [facility] for continuous EEG monitoring. Patient mother, father and sister were in the hospital and provided detailed history of deterioration of mental status and noncompliant with the medication management and withdrawal symptoms. Patient also has history of traumatic brain injury, concussion injury while playing football and shoulder injury while playing rugby.   Past Psychiatric History: no prior psychiatric history before Nov 2016. He has three acute psych admission at [facility] and one admission in [place] for uncontrollable agitation and aggressive behaviors. 1st Hospitalization in our unit from 02/23/15 to 03/01/15, 2nd Hospitalization in our unit: 03/07/15 to 03/14/15: 3rd Hospitalization in [place]: After his second hospitalization he f/u with [facility] in [place] where saphris was added to prozac and depakote. He also f/o with [facility] for therapy.  Interval history: Patient has been slowly progressing of his mental status and physical activity, he is less sedated and anxious and tremors today. He is more awake, alert, oriented and has quick and expanded verbal responses for the queries. Spoke with the patient father and mother who were at bedside. Patient father  is giving positive changes in his progress and his mother shows emotions about  his negative physical problems like he can not hold his head straight and still has mild tremors on his hands. Patient dad stated that he is able stretch his body and extremities while in his bed and able to walk few steps with physical therapy and satisfied slow progression compare with no progress initial week or so. He has ongoing but less muscle rigidity, fine tremors, and no pin rolling movements on hands. Parents report that the case manager from [employer] is searching for placement in [facility] hospital. He is able to follow directions and move head, neck, hands and legs with some limitation. He has no ADL's at current physical condition and his family cannot provide medical care at home. Psychiatric consultation will continue to follow-up and will provide support and appropriate medication recommendations.     Risk to Self: Is patient at risk for suicide?: No Risk to Others:   Prior Inpatient Therapy:   Prior Outpatient Therapy:    Past Medical History:  Past Medical History  Diagnosis Date  . GERD (gastroesophageal reflux disease)   . Anxiety   . Depression     Past Surgical History  Procedure Laterality Date  . No past surgeries     Family History:  Family History  Problem Relation Age of Onset  . Heart disease Maternal Grandfather   . Diabetes Maternal Grandfather   . Heart disease Paternal Grandmother    Family Psychiatric  History: Family history of depression.  Social History:  History  Alcohol Use No     History  Drug Use  . Yes  . Special: Marijuana    Social History   Social History  . Marital Status: Single    Spouse Name: N/A  . Number of Children: N/A  . Years of Education: N/A   Social History Main Topics  . Smoking status: Former Smoker    Types: Cigars  . Smokeless tobacco: Never Used  . Alcohol Use: No  . Drug Use: Yes    Special: Marijuana  . Sexual Activity: No   Other Topics Concern  . None   Social History Narrative    Additional Social History:    Allergies:   Allergies  Allergen Reactions  . Haldol [Haloperidol Lactate] Other (See Comments)    Angioedema [facility] lists no allergies as of 05/15/15)    Labs:  No results found for this or any previous visit (from the past 48 hour(s)).  Current Facility-Administered Medications  Medication Dose Route Frequency Provider Last Rate Last Dose  . acetaminophen (TYLENOL) solution 500 mg  500 mg Per Tube Q4H PRN Lonia Blood, MD   650 mg at 06/12/15 2358  . amantadine (SYMMETREL) capsule 100 mg  100 mg Oral TID Nishant Dhungel, MD   100 mg at 06/13/15 0911  . antiseptic oral rinse (CPC / CETYLPYRIDINIUM CHLORIDE 0.05%) solution 7 mL  7 mL Mouth Rinse q12n4p Drema Dallas, MD   7 mL at 06/13/15 1302  . baclofen (LIORESAL) tablet 10 mg  10 mg Oral BID Ram Daniel Nones, MD   10 mg at 06/13/15 1302  . baclofen (LIORESAL) tablet 20 mg  20 mg Oral QHS Ram Daniel Nones, MD   20 mg at 06/12/15 2222  . benztropine (COGENTIN) tablet 0.5 mg  0.5 mg Oral BID Leata Mouse, MD   0.5 mg at 06/13/15 0911  . bisacodyl (DULCOLAX) suppository 10  mg  10 mg Rectal Daily PRN Penny Pia, MD   10 mg at 06/13/15 4098  . bromocriptine (PARLODEL) tablet 2.5 mg  2.5 mg Per Tube QID Lonia Blood, MD   2.5 mg at 06/13/15 1302  . chlorhexidine (PERIDEX) 0.12 % solution 15 mL  15 mL Mouth Rinse BID Drema Dallas, MD   15 mL at 06/13/15 0911  . enoxaparin (LOVENOX) injection 40 mg  40 mg Subcutaneous Q24H Lonia Blood, MD   40 mg at 06/12/15 2223  . feeding supplement (ENSURE ENLIVE) (ENSURE ENLIVE) liquid 237 mL  237 mL Oral TID BM Drema Dallas, MD   237 mL at 06/13/15 1303  . LORazepam (ATIVAN) tablet 1-2 mg  1-2 mg Oral Q4H PRN Drema Dallas, MD      . neomycin-bacitracin-polymyxin (NEOSPORIN) ointment   Topical BID Oretha Milch, MD   1 application at 06/13/15 0912  . ondansetron (ZOFRAN) tablet 4 mg  4 mg Oral Q6H PRN  Drema Dallas, MD        Musculoskeletal: Strength & Muscle Tone: decreased Gait & Station: unable to stand Patient leans: N/A  Psychiatric Specialty Exam: Review of Systems:   Blood pressure 98/60, pulse 97, temperature 98.2 F (36.8 C), temperature source Oral, resp. rate 20, height  (1.753 m), weight 119.2 kg (262 lb 12.6 oz), SpO2 99 %.Body mass index is 38.79 kg/(m^2).  General Appearance: Guarded  Eye Contact::  Fair  Speech:  Clear and Coherent and Slow  Volume:  Decreased  Mood:  Depressed  Affect:  Constricted and Depressed  Thought Process:  Coherent and Goal Directed  Orientation:  Full (Time, Place, and Person)  Thought Content:  WDL and Wants to go home  Suicidal Thoughts:  No  Homicidal Thoughts:  No  Memory:  Immediate;   Fair Recent;   Fair  Judgement:  Fair  Insight:  Shallow  Psychomotor Activity:  Limited psychomotor activity  Concentration:  Fair  Recall:  [employer] of Knowledge:Fair  Language: Fair  Akathisia:  Negative  Handed:  Right  AIMS (if indicated):     Assets:  Communication Skills Desire for Improvement Financial Resources/Insurance Housing Leisure Time Resilience Social Support Talents/Skills Transportation  ADL's:  Impaired  Cognition: Impaired,  Severe  Sleep:      Treatment Plan Summary:  Patient admitted with major depressive disorder, intermittent explosive disorder, TBI, status post seizures, status post intubation/extubation and recent history of multiple acute psychiatric hospitalization at [facility] and also [place]. Reportedly noncompliant with medications while being in detention center/Jail, has history of multiple episodes of uncontrollable anger outbursts, intermittent explosive behaviors, TBI, suicidal and homicidal threats in the past.    Neuroleptic malignant syndrome : Avoid psychotropic medication until completely recovered.  Continue Ativan 1-2 mg every 4 hours as needed for  excessive anxiety and agitation   Amantadine 100 mg TID for parkinson symptoms Bromocriptine 2.5 mg QID for NMS as per neurologuy Comprehensive metabolic panel is WNL  Increase Benzotropin 1 mg PO BID for EPS   Patient valproic acid level is less than 12 and at the same time he has no mood swings at this time Spoke with neurology and agree with discontinue Depakote  Appreciate psychiatric consultation and follow up as clinically required Please contact [phone] or [phone] if needs further assistance  Disposition: Patient does not meet criteria for inpatient psychiatric hospitalization as he has no ADL's and denied SI/HI. Patient benefit  from PT and OT and possibly needed inpatient rehabilitation services when medically stable.    Nehemiah SettleJONNALAGADDA,JANARDHAHA R., MD 06/13/2015 3:41 PM

## 2015-06-13 NOTE — Progress Notes (Signed)
Briefly met pt's parents at bedside with RN CM. They are working with a BCBS CM on pschiatric rehab at [facility] as well a their lawyer to follow up on an option for his bail monitoring. CIR not an option at this time. [phone]

## 2015-06-13 NOTE — Consult Note (Signed)
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  Referring Physician: Dr. Cena Benton   HPI: Chase Hampton is a 25 y.o. male with history of HTN, bipolar disorder with recent admissions for major depression with homicidal and suicidal ideation who was transferred from [facility] to [place] [facility] due to violation of restraining order and refused to eat, drink, take any medications and has multiple episodes of aggressive behaviors including striking his head against the wall.  He was admitted with seizure activity requiring intubation for airway protection.  CT head negative for acute changes and rhabdomyolysis treated with IVF.  LP negative for infection. LTM EEG monitoring negative for seizure activity but he was noted to have episodes of BUE and neck rigidity concerning for neuroleptic malignant syndrome.  He has had encephalopathy with clinical picture suggestive of malignant catatonia. MRI brain negative and MRI thoracic spine with nonspecific posterior paravertebral edema extending into upper thoracic regions. Neurology recommended adding bromocriptine and baclofen to help manage symptoms and to avoid psychotropic medications till completely recovered. He was found to have blood cultures positive for coag negative staph and was treated with IV ancef X 2 weeks per ID recommendations.    Psychiatry consulted for input and support and added Cogentin for EPS and recommended using ativan prn for management of anxiety and agitation as poor intake remains poor.  Diet downgraded to dysphagia 2, thin due to poor mastication and issues with oral clearance.   Per Mother--he has had issue with tremors for the 18 months but worse now.  He has been under lot of stress since spring of last year--graduated from college, fiancee cancelled wedding.   Mother reports--patient unable feed himself or take his meds at jail which led to  current situation and that BCBS case worker was working on getting patient to Psychiatric rehab unit at [facility]? Patient out on bail in parents custody.  He has been unable to tolerate sitting out of bed due to back discomfort.  Was disimpacted this am. Therapy ongoing and CIR recommended for follow up therapy.   Review of Systems  Constitutional: Negative for fever and chills.  Cardiovascular: Negative for chest pain.  Neurological: Negative for sensory change and focal weakness.  All other systems reviewed and are negative.   Past Medical History  Diagnosis Date  . GERD (gastroesophageal reflux disease)   . Anxiety   . Depression     Past Surgical History  Procedure Laterality Date  . No past surgeries      Family History  Problem Relation Age of Onset  . Heart disease Maternal Grandfather   . Diabetes Maternal Grandfather   . Heart disease Paternal Grandmother     Social History:  Graduated last summer and has been living with parents since then. Per  reports that he has quit smoking. His smoking use included Cigars. He has never used smokeless tobacco. Per reports that he has used illicit drugs (Marijuana). He reports that he does not drink alcohol.   Allergies  Allergen Reactions  . Haldol [Haloperidol Lactate] Other (See Comments)    Angioedema [facility] lists no allergies as of 05/15/15)    Medications Prior to Admission  Medication Sig Dispense Refill  . asenapine (SAPHRIS) 5 MG SUBL 24 hr tablet Place 1 tablet (5 mg total) under the tongue at bedtime. 30 tablet 0  . divalproex (DEPAKOTE ER) 500 MG 24 hr tablet Take 2 tablets (  1,000 mg total) by mouth at bedtime. 60 tablet 0  . DULoxetine (CYMBALTA) 60 MG capsule Take 1 capsule (60 mg total) by mouth daily. 30 capsule 0  . meloxicam (MOBIC) 15 MG tablet Take 1 tablet (15 mg total) by mouth daily. 30 tablet 0  . clonazePAM (KLONOPIN) 0.5 MG tablet Take 1 tablet (0.5 mg total) by mouth at bedtime. (Patient  not taking: Reported on 05/15/2015) 30 tablet 0  . clonazePAM (KLONOPIN) 0.5 MG tablet Take 1 tablet (0.5 mg total) by mouth 3 (three) times daily as needed (anxiety). 30 tablet 0    Home: Home Living Family/patient expects to be discharged to:: Dentention/Prison Living Arrangements: Other (Comment) (jail) Available Help at Discharge: Family  Functional History: Prior Function Level of Independence: Independent Comments: Per mother pt was independent w/o use of AD  Functional Status:  Mobility: Bed Mobility Overal bed mobility: Needs Assistance Bed Mobility: Supine to Sit Supine to sit: Mod assist, HOB elevated (Pt able to performed trunk flexion into seated position unassisted but remains to require mod assist for scooting to edge of bed.  ) Sit to supine: Max assist, +2 for physical assistance (Pt required increased assist against gravity for transfer back to bed.  Pt required cues for pulling up into supine position with use of rail.  ) General bed mobility comments: Pt presents with improved carryover with assist decreased and ability to follow commands to assist with movements.  Pt extremely motivated.   Transfers Overall transfer level: Needs assistance Equipment used: Rolling walker (2 wheeled) Transfer via Lift Equipment: Huntley DecSara Lift Cisco(Sara Plus) Transfers: Sit to/from Stand Sit to Stand: Mod assist, +2 physical assistance (able to maintain standing with max assist +1 while nurse adjusted pads in bed.  ) Stand pivot transfers: Mod assist, +2 physical assistance, +2 safety/equipment, From elevated surface General transfer comment: Pt required mod assist +2 and able to take side steps edge of bed to assist in moving higher up in bed.  Pt pulls on RW for leverage despite cues.  Pt able to follow commands to reach back for seated surface during descent.   Ambulation/Gait Ambulation/Gait assistance:  (Will inform supervising PT to add gait goals next visit.  ) General Gait Details:  transfer to chair    ADL:    Cognition: Cognition Overall Cognitive Status: Within Functional Limits for tasks assessed Arousal/Alertness: Awake/alert Orientation Level: Oriented X4 Attention: Focused, Sustained Focused Attention: Appears intact Sustained Attention: Impaired Sustained Attention Impairment: Verbal basic, Functional basic Cognition Arousal/Alertness: Awake/alert Behavior During Therapy: Flat affect Overall Cognitive Status: Within Functional Limits for tasks assessed Area of Impairment: Attention, Following commands, Awareness, Problem solving Current Attention Level: Selective Memory: Decreased short-term memory Following Commands: Follows one step commands with increased time Safety/Judgement: Decreased awareness of deficits, Decreased awareness of safety Awareness: Intellectual Problem Solving: Slow processing, Difficulty sequencing, Requires verbal cues General Comments: spoke to PT with time to process the questions Difficult to assess due to: Impaired communication   Blood pressure 100/58, pulse 95, temperature 97.8 F (36.6 C), temperature source Oral, resp. rate 18, height 5\' 9"  (1.753 m), weight 119.2 kg (262 lb 12.6 oz), SpO2 97 %. Physical Exam  Nursing note and vitals reviewed. Constitutional: He is oriented to person, place, and time. He appears well-developed and well-nourished.  Obese male lying in bed --incontinent of bowel and bladder.  HENT:  Head: Normocephalic and atraumatic.  Eyes: Conjunctivae and EOM are normal.  Dilated pupils.   Neck: Normal range of motion. Neck  supple.  Cardiovascular: Normal rate and regular rhythm.   Respiratory: Effort normal and breath sounds normal. No respiratory distress. He has no wheezes.  GI: Soft. Bowel sounds are normal. He exhibits no distension. There is no tenderness.  Musculoskeletal: He exhibits edema (1+ edema bilateral hands and feet. ). He exhibits no tenderness.  Neurological: He is alert and  oriented to person, place, and time.  BUE tremors worse with activity.  BUE with tone and difficulty extending at elbows.   Did not make eye contact Able to follow simple one step commands.  Motor: Bilateral upper extremities: 4/5 proximal to distal Bilateral lower extremities: 4+/5 proximal to distal DTRs symmetric  Skin: Skin is warm and dry.  Psychiatric: His affect is inappropriate. His speech is delayed. He is slowed and withdrawn. He expresses inappropriate judgment.    No results found for this or any previous visit (from the past 24 hour(s)). No results found.  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  Records reviewed and summated above.  1. Does the need for close, 24 hr/day medical supervision in concert with the patient's rehab needs make it unreasonable for this patient to be served in a less intensive setting? Potentially  2. Co-Morbidities requiring supervision/potential complications: HTN (monitor and provide prns in accordance with increased physical exertion and pain), bipolar disorder with recent admissions for major depression (Con I do not t recs per Psych), rhabdomyolysis (Cont to monitor labs), bacteremia (cont IV abx per ID), poor intake (continue to encourage), dysphagia (continue SLP, consider vital stim), ABLA (transfuse if necessary to ensure appropriate perfusion for increased activity tolerance), bowel bladder incontinence (consider bowel and bladder program), seizures (continue to monitor) 3. Due to bladder management, bowel management, safety, skin/wound care, disease management, medication administration and patient education, does the patient require 24 hr/day rehab nursing? Yes 4. Does the patient require coordinated care of a physician, rehab nurse, PT (1-2 hrs/day, 5 days/week), OT (1-2 hrs/day, 5 days/week) and SLP (1-2 hrs/day, 5 days/week) to address physical and functional  deficits in the context of the above medical diagnosis(es)? Yes Addressing deficits in the following areas: balance, endurance, locomotion, strength, transferring, bowel/bladder control, bathing, dressing, feeding, grooming, toileting, cognition, speech, language, swallowing and psychosocial support 5. Can the patient actively participate in an intensive therapy program of at least 3 hrs of therapy per day at least 5 days per week? Potentially 6. The potential for patient to make measurable gains while on inpatient rehab is excellent 7. Anticipated functional outcomes upon discharge from inpatient rehab are supervision and min assist  with PT, supervision and min assist with OT, modified independent with SLP. 8. Estimated rehab length of stay to reach the above functional goals is: 17-20 days. 9. Does the patient have adequate social supports and living environment to accommodate these discharge functional goals? No 10. Anticipated D/C setting: Other 11. Anticipated post D/C treatments: SNF/Psych 12. Overall Rehab/Functional Prognosis: good  RECOMMENDATIONS: This patient's condition is appropriate for continued rehabilitative care in the following setting: Patient's family currently states that patient is in the process of potentially being admitted to [facility] for combined physical and psychological rehabilitation. Further, patient does not have 24/7 support from caregivers at discharge, and in his current state,  will unlikely be able to obtain an independent level of functioning after a short IRF stay. In addition, patient with psychiatric limitations. Once these are under better control, there is a good possibility patient will make significant functional  gains. If pt does not qualify for Psych admission, would consider SNF if family is unable to care for patient. Patient has agreed to participate in recommended program. Potentially Note that insurance prior authorization may be required for  reimbursement for recommended care.  Comment: Rehab Admissions Coordinator to follow up.  Maryla Morrow, MD 06/13/2015

## 2015-06-13 NOTE — Progress Notes (Signed)
[facility] PROGRESS NOTE  Chase Hampton ZOX:[id number] DOB: 12-03-1990 DOA: 05/15/2015 PCP: [name], MD  Brief narrative 25 y/o Mw/ a Hx of Anxiety, Depression (prior [facility] hospitalizations) with recent admission to [facility] in  from 2/3 > 2/8 for major depressive disorder with homicidal / suicidal ideation after a break up with his fiancee. Due to restraining order violations, he was transferred from [facility] to [facility] jail were he had been on suicide watch. The patient reportedly refused to eat, drink or take his medications. He reportedly would only lay in the floor and urinate on himself. After multiple episodes of urinating on himself, he was asked to mop up the floor, which he was able to perform. Prior to discharge from [facility], notes reflect he had multiple episodes of physically aggressive behavior - including striking his own head on the seclusion room walls. At discharge he was to continue on Cymbalta, Depakote and Saphris. Prison staff report the patient refused all medications.   On 2/19, the patient was noted to have a seizure in jail. He was transported to [facility] ER via EMS. It is documented that he arrived to the ER with urine and feces all over him. There is question if patient was weaning off ativan?? Per [facility] notes but discharge note from [facility] does not include Rx for ativan. Room air saturations were 82%, he was treated with 100% NRB. He was treated with  IV versed per EMS en route. The patient had a second seizure in ER and was ultimately intubated. He was transferred to [facility] ICU for further evaluation. The patient arrived to [facility] on propofol, mechanically intubated.   Significant hospital course: 2/19 Admit from jail with seizure, intubated  2/20 LP > negative for infection  2/20 EEG > No active seizures.  2/21 Repeat EEG > no seizure activity 2/21 TEE normal  2/22 focal seizures mouth 2/27 MRI brain wo contrast normal 2/27 MRI T-spine/C-spine  - Edema posterior paraspinal musculature - minimal amount of retropharyngeal edema - Nonspecific posterior paravertebral edema extends into the upper thoracic region - Multiple bulging disks with cord flattening  -3/14: NG removed, referral sent out for SNF placement  Assessment/Plan: FUO (103.8 05/26/15)/ Altered mental status / ?NMS -NMS?- no evidence of infection  -Neurology was following patient was in the hospital. Currently on bromocriptine and amantadine. -No longer requiring a cooling blanket -avoid serotonin active agents or antipsychotics . - No fevers - CIR not an option per Rehab admission coordinator  Staph hominis bacteremia 2/2 + blood cultures upon admission to [facility] ED 05/15/2015  - TEE negative and LP benign - felt to be a genuine bacteremia per ID  Completed 2 weeks course of IV antibiotics on 05/29/2015, as per recommendations. - Blood culture repeated today to ensure clearance. (repeast Blood cx still negative.)  Acute Hypoxic Respiratory Failure  - Resolved   Hypernatremia  - Due to hypovolemia - resolved   Hypotension  -resolved  Rhabdomyolysis Achy on admission >7500. Received IV hydration. Now resolved.  Bipolar D/O w/ Depression > Catatonia  -Appreciate psychiatry recommendations. Suspected NMS recommended to avoid antipsychotics. On scheduled low-dose Klonopin and when necessary Ativan for agitation. Continue Depakote.  DVT prophylaxis: subontinuous lovenox   Diet: On NG tube feeds. SLP has cleared for dysphagia level III diet. NG removed today. Encouraged by mouth intake.  Code Status: FULL code Family Communication: Parents at bedside Disposition Plan: Disposition planning. If no places available for SNF will d/c patient home. Most likely next am  3/21  Consultants: Psychiatry PCCM Neurology  ID  Antibiotics: Rocephin 2/19 > 2/22 Vanco 2/20 > 2/22 Cefazolin 2/22 > 3/5   HPI/Subjective: Patient has no new complaints states he feels  better.  Objective: Filed Vitals:   06/13/15 1023 06/13/15 1421  BP: 118/49 98/60  Pulse: 99 97  Temp: 98.9 F (37.2 C) 98.2 F (36.8 C)  Resp: 20 20    Intake/Output Summary (Last 24 hours) at 06/13/15 1708 Last data filed at 06/13/15 0030  Gross per 24 hour  Intake      0 ml  Output   1150 ml  Net  -1150 ml   Filed Weights   06/08/15 0525 06/09/15 0408 06/10/15 0500  Weight: 121.7 kg (268 lb 4.8 oz) 117 kg (257 lb 15 oz) 119.2 kg (262 lb 12.6 oz)    Exam:   General: awake and alert and in nad.  HEENT: No pallor, moist mucosa  Cardiovascular: Normal S1 and S2, no murmurs rub or gallop  Respiratory: Clear bilaterally, no added sounds  Abdomen: Soft, nondistended, nontender, bowel sounds present  Musculoskeletal: Warm, no edema  CNS: no facial asymmetry  Data Reviewed: Basic Metabolic Panel:  Recent Labs Lab 06/09/15 0555  NA 141  K 4.0  CL 101  CO2 31  GLUCOSE 102*  BUN 10  CREATININE 0.78  CALCIUM 9.4   Liver Function Tests:  Recent Labs Lab 06/09/15 0555  AST 17  ALT 32  ALKPHOS 55  BILITOT 0.6  PROT 5.7*  ALBUMIN 2.5*   No results for input(s): LIPASE, AMYLASE in the last 168 hours. No results for input(s): AMMONIA in the last 168 hours. CBC: No results for input(s): WBC, NEUTROABS, HGB, HCT, MCV, PLT in the last 168 hours. Cardiac Enzymes: No results for input(s): CKTOTAL, CKMB, CKMBINDEX, TROPONINI in the last 168 hours. BNP (last 3 results) No results for input(s): BNP in the last 8760 hours.  ProBNP (last 3 results) No results for input(s): PROBNP in the last 8760 hours.  CBG:  Recent Labs Lab 06/07/15 1134 06/07/15 1704 06/07/15 2010 06/07/15 2354 06/08/15 0352  GLUCAP 121* 70 76 85 96    Recent Results (from the past 240 hour(s))  Culture, blood (Routine X 2) w Reflex to ID Panel     Status: None   Collection Time: 06/07/15  7:10 AM  Result Value Ref Range Status   Specimen Description BLOOD LEFT HAND  Final    Special Requests IN PEDIATRIC BOTTLE 3CC  Final   Culture NO GROWTH 5 DAYS  Final   Report Status 06/12/2015 FINAL  Final  Culture, blood (Routine X 2) w Reflex to ID Panel     Status: None   Collection Time: 06/07/15  7:20 AM  Result Value Ref Range Status   Specimen Description BLOOD RIGHT ANTECUBITAL  Final   Special Requests BOTTLES DRAWN AEROBIC ONLY 8CC  Final   Culture NO GROWTH 5 DAYS  Final   Report Status 06/13/2015 FINAL  Final     Studies: No results found.  Scheduled Meds: . amantadine  100 mg Oral TID  . antiseptic oral rinse  7 mL Mouth Rinse q12n4p  . baclofen  10 mg Oral BID  . baclofen  20 mg Oral QHS  . benztropine  1 mg Oral BID  . bromocriptine  2.5 mg Per Tube QID  . chlorhexidine  15 mL Mouth Rinse BID  . enoxaparin (LOVENOX) injection  40 mg Subcutaneous Q24H  . feeding supplement (ENSURE ENLIVE)  237 mL Oral TID BM  . neomycin-bacitracin-polymyxin   Topical BID   Continuous Infusions:    Time spent:25 minutes  Penny Pia  [facility] Pager [phone] If 7PM-7AM, please contact night-coverage at [email or web address], password [email or web address] 06/13/2015, 5:08 PM  LOS: 29 days

## 2015-06-13 NOTE — Progress Notes (Signed)
[facility], gait belt and walker, pt still very shaky and unsteady but was able to take lilttle baby steps unto the commode, sat up for a few minutes and was assisted back to bed, pt reassured, family at bedside, will continue to monitor. Obasogie-Asidi, Geri Hepler Efe

## 2015-06-13 NOTE — Progress Notes (Signed)
Pt c/o of being constipated, attempted to use the bedpan, small amount of hard stool extracted manually out, dulcolax suppository ordered and given at 0612, pt cleaned up and made comfortable in bed, tremors more pronounced as he was trying to bear down, was however reassured, will continue to monitor. Obasogie-Asidi, Kyisha Fowle Efe

## 2015-06-13 NOTE — Progress Notes (Signed)
Physical Therapy Treatment Patient Details Name: RAMI BUDHU MRN: [id number] DOB: [birth year] Today's Date: 06/13/2015    History of Present Illness Pt is a 25 y/o M admitted from jail where he had multiple episodes of urinating on himself and physically aggressive behavior, including striking his own head on the wall.  Pt had another seizure in the ER and was ultimately intubated.  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  His PMH includes anxiety and depression.    PT Comments    Patient's ROM all joints limited. Able to perform AROM all joints (AAROM at some levels for incr ROM). Exercises performed prior to OOB to prepare for mobility. Able to tolerate ambulation in room. Noted CIR is not a discharge option and updated to SNF.   Follow Up Recommendations  Supervision/Assistance - 24 hour;SNF     Equipment Recommendations  Other (comment) (TBD at SNF)    Recommendations for Other Services OT consult     Precautions / Restrictions Precautions Precautions: Fall Precaution Comments: seizure precautions; watch HR and RR Restrictions Weight Bearing Restrictions: No    Mobility  Bed Mobility Overal bed mobility: Needs Assistance Bed Mobility: Supine to Sit     Supine to sit: Mod assist;HOB elevated     General bed mobility comments: pt stated he was going to roll and sit up, then pulled torso strainght up to long-sitting; vc he "walked" legs off EOB; mod assist to scoot to edge and reach feet to floor  Transfers Overall transfer level: Needs assistance Equipment used: 2 person hand held assist Transfers: Sit to/from Stand Sit to Stand: Mod assist;+2 physical assistance;+2 safety/equipment         General transfer comment: Bed at lowest height; 1st attempt with Rt hand on RW and lt hand push off bed rail; pt pushes posterior with feet sliding forward & requires assist to shift anteriorly; 2nd time both hands pushing from  chair  Ambulation/Gait Ambulation/Gait assistance: Min assist;+2 safety/equipment Ambulation Distance (Feet): 6 Feet (seated rest x 3 minutes; 13) Assistive device: Rolling walker (2 wheeled) Gait Pattern/deviations: Step-through pattern;Decreased stride length;Decreased dorsiflexion - right;Decreased dorsiflexion - left;Shuffle;Trunk flexed     General Gait Details: pt limited DF to neutral; very little flexion of hips or knees; close follow with chair for safety   Stairs            Wheelchair Mobility    Modified Rankin (Stroke Patients Only)       Balance     Sitting balance-Leahy Scale: Fair   Postural control: Posterior lean Standing balance support: Bilateral upper extremity supported Standing balance-Leahy Scale: Poor                      Cognition Arousal/Alertness: Awake/alert Behavior During Therapy: Flat affect Overall Cognitive Status: Impaired/Different from baseline Area of Impairment: Attention;Awareness;Problem solving   Current Attention Level: Selective Memory: Decreased short-term memory     Awareness: Emergent Problem Solving: Slow processing;Difficulty sequencing;Requires verbal cues General Comments: requires step by step instrucitons for sequencing supine to sit EOB, use of RW sit to stand    Exercises General Exercises - Lower Extremity Ankle Circles/Pumps: AROM;Both;10 reps Quad Sets: AAROM;Both;5 reps;Supine Heel Slides: AAROM;Both;5 reps Other Exercises Other Exercises: Seated PROM for DF; prolonged hold with ~10 degrees beyond neutal achieved Other Exercises: AROM head/neck with pt able to achieve neutral    General Comments General comments (skin integrity, edema, etc.): mother present; very encouraging  Pertinent Vitals/Pain Pain Assessment: Faces Faces Pain Scale: No hurt    Home Living                      Prior Function            PT Goals (current goals can now be found in the care plan  section) Acute Rehab PT Goals Patient Stated Goal: to walk PT Goal Formulation: With patient/family Time For Goal Achievement: 06/27/15 Potential to Achieve Goals: Good Progress towards PT goals: Goals met and updated - see care plan    Frequency  Min 3X/week    PT Plan Discharge plan needs to be updated    Co-evaluation             End of Session Equipment Utilized During Treatment: Gait belt Activity Tolerance: Patient limited by fatigue Patient left: in chair;with call bell/phone within reach;with family/visitor present     Time: 3382-5053 PT Time Calculation (min) (ACUTE ONLY): 22 min  Charges:  $Gait Training: 8-22 mins                    G Codes:      Melizza Kanode 09-Jul-2015, 2:10 PM Pager [phone]

## 2015-06-14 MED ORDER — BENZTROPINE MESYLATE 1 MG PO TABS: 1.0000 mg | ORAL_TABLET | Freq: Two times a day (BID) | ORAL | Status: DC

## 2015-06-14 MED ORDER — BACLOFEN 20 MG PO TABS: 20.0000 mg | ORAL_TABLET | Freq: Every day | ORAL | Status: DC

## 2015-06-14 MED ORDER — AMANTADINE HCL 100 MG PO CAPS: 100.0000 mg | ORAL_CAPSULE | Freq: Three times a day (TID) | ORAL | Status: DC

## 2015-06-14 MED ORDER — BROMOCRIPTINE MESYLATE 2.5 MG PO TABS: 2.5000 mg | ORAL_TABLET | Freq: Four times a day (QID) | ORAL | Status: DC

## 2015-06-14 MED ORDER — BACLOFEN 10 MG PO TABS: 10.0000 mg | ORAL_TABLET | Freq: Two times a day (BID) | ORAL | Status: DC

## 2015-06-14 NOTE — Discharge Summary (Signed)
Physician Discharge Summary  COURT GRACIA ZOX:[id number] DOB: 07-22-1990 DOA: 05/15/2015  PCP: [name], MD  Admit date: 05/15/2015 Discharge date: 06/14/2015  Time spent: > 35  minutes  Recommendations for Outpatient Follow-up:  1. Pt to f/u with Psychiatry and Neurology after d/c for further Evaluation and recommendations   Discharge Diagnoses:  Principal Problem:   Neuroleptic malignant syndrome Active Problems:   Status epilepticus (HCC)   Seizures (HCC)   Acute respiratory failure (HCC)   Coagulase negative Staphylococcus bacteremia   Bipolar I disorder, most recent episode depressed (HCC)   Suicidal ideation   Homicidal ideation   Impulse control disease   Acute encephalopathy   Encounter for orogastric (OG) tube placement   Seizure (HCC)   Acute respiratory failure with hypoxia and hypercapnia (HCC)   Encephalopathy acute   Catatonia (HCC)   FUO (fever of unknown origin)   Depression   Rigidity   Muscle spasm   Hypotension   Weakness   Benign essential HTN   Non-traumatic rhabdomyolysis   Dysphagia   Acute blood loss anemia   Bowel and bladder incontinence   Discharge Condition: stable  Diet recommendation: dysphagia 2  Filed Weights   06/09/15 0408 06/10/15 0500 06/14/15 0453  Weight: 117 kg (257 lb 15 oz) 119.2 kg (262 lb 12.6 oz) 116.3 kg (256 lb 6.3 oz)    History of present illness:  25 y/o Mw/ a Hx of Anxiety, Depression (prior [facility] hospitalizations) with recent admission to [facility] in [place] from 2/3 > 2/8 for major depressive disorder with homicidal / suicidal ideation after a break up with his fiancee. Due to restraining order violations, he was transferred from [facility] to [facility] jail were he had been on suicide watch. The patient reportedly refused to eat, drink or take his medications. He reportedly would only lay in the floor and urinate on himself. After multiple episodes of urinating on himself, he was asked to mop up the floor,  which he was able to perform. Prior to discharge from [facility], notes reflect he had multiple episodes of physically aggressive behavior - including striking his own head on the seclusion room walls. At discharge he was to continue on Cymbalta, Depakote and Saphris. Prison staff report the patient refused all medications.   On 2/19, the patient was noted to have a seizure in jail. He was transported to [facility] ER via EMS. It is documented that he arrived to the ER with urine and feces all over him. There is question if patient was weaning off ativan?? Per [facility] notes but discharge note from [facility] does not include Rx for ativan. Room air saturations were 82%, he was treated with 100% NRB. He was treated with  IV versed per EMS en route. The patient had a second seizure in ER and was ultimately intubated. He was transferred to [facility] ICU for further evaluation. The patient arrived to [facility] on propofol, mechanically intubated.   Hospital Course:  FUO (103.8 05/26/15)/ Altered mental status / ?NMS -NMS?- no evidence of infection  -Neurology was following patient was in the hospital. Continued on bromocriptine and amantadine. -No longer requiring a cooling blanket -avoid serotonin active agents or antipsychotics . -Mentation has improved but his family and patient answering questions appropriately but he is mostly somnolent and still not getting out of bed on his own. He is able to move his extremities .  Staph hominis bacteremia 2/2 + blood cultures upon admission to [facility] ED 05/15/2015  - TEE negative and  LP benign - felt to be a genuine bacteremia per ID  Completed 2 weeks course of IV antibiotics on 05/29/2015, as per recommendations. -Blood culture repeated today to ensure clearance.  Acute Hypoxic Respiratory Failure  - Resolved   Hypernatremia  -due to hypovolemia - resolved   Hypotension  -resolved  Rhabdomyolysis\ Achy on admission >7500. Received IV hydration. Now  resolved.  Bipolar D/O w/ Depression > Catatonia  -Appreciate psychiatry recommendations. Psychiatry on board and recommended the following: Amantadine 100 mg TID for parkinson symptoms Bromocriptine 2.5 mg QID for NMS as per neurologuy Comprehensive metabolic panel is WNL  Increase Benzotropin 1 mg PO BID for   Patient reportedly did not meet inpatient criteria for inpatient psychiatric hospitalization  Procedures: 2/19 Admit from jail with seizure, intubated  2/20 LP > negative for infection  2/20 EEG > No active seizures.  2/21 Repeat EEG > no seizure activity 2/21 TEE normal  2/22 focal seizures mouth 2/27 MRI brain wo contrast normal 2/27 MRI T-spine/C-spine - Edema posterior paraspinal musculature - minimal amount of retropharyngeal edema - Nonspecific posterior paravertebral edema extends into the upper thoracic region - Multiple bulging disks with cord flattening  -3/14: NG removed, referral sent out for SNF placement  Consultations:  Psychiatry   Discharge Exam: Filed Vitals:   06/14/15 0619 06/14/15 0948  BP: 105/61 99/55  Pulse: 73 72  Temp: 98.8 F (37.1 C) 98.1 F (36.7 C)  Resp: 18 16    General: Pt in nad, alert and awake Cardiovascular: rrr, no rubs Respiratory: no increased wob, no wheezes  Discharge Instructions   Discharge Instructions    Call MD for:  extreme fatigue    Complete by:  As directed      Call MD for:  severe uncontrolled pain    Complete by:  As directed      Call MD for:  temperature >100.4    Complete by:  As directed      Diet - low sodium heart healthy    Complete by:  As directed      Discharge instructions    Complete by:  As directed   Please have patient follow up with neurologist and psychiatrist after hospital discharge.     Increase activity slowly    Complete by:  As directed           Current Discharge Medication List    START taking these medications   Details  amantadine (SYMMETREL) 100 MG  capsule Take 1 capsule (100 mg total) by mouth 3 (three) times daily. Qty: 90 capsule, Refills: 0    !! baclofen (LIORESAL) 10 MG tablet Take 1 tablet (10 mg total) by mouth 2 (two) times daily. Qty: 30 each, Refills: 0    !! baclofen (LIORESAL) 20 MG tablet Take 1 tablet (20 mg total) by mouth at bedtime. Qty: 30 each, Refills: 0    benztropine (COGENTIN) 1 MG tablet Take 1 tablet (1 mg total) by mouth 2 (two) times daily. Qty: 60 tablet, Refills: 0    bromocriptine (PARLODEL) 2.5 MG tablet Place 1 tablet (2.5 mg total) into feeding tube 4 (four) times daily. Qty: 120 tablet, Refills: 0     !! - Potential duplicate medications found. Please discuss with provider.    CONTINUE these medications which have NOT CHANGED   Details  clonazePAM (KLONOPIN) 0.5 MG tablet Take 1 tablet (0.5 mg total) by mouth 3 (three) times daily as needed (anxiety). Qty: 30 tablet, Refills: 0  STOP taking these medications     asenapine (SAPHRIS) 5 MG SUBL 24 hr tablet      divalproex (DEPAKOTE ER) 500 MG 24 hr tablet      DULoxetine (CYMBALTA) 60 MG capsule      meloxicam (MOBIC) 15 MG tablet        Allergies  Allergen Reactions  . Haldol [Haloperidol Lactate] Other (See Comments)    Angioedema [facility] lists no allergies as of 05/15/15)   Follow-up Information    Follow up with [facility] EMERGENCY DEPARTMENT.   Specialty:  Emergency Medicine   Contact information:   [address] [id number] mc [place] [address] [phone]       The results of significant diagnostics from this hospitalization (including imaging, microbiology, ancillary and laboratory) are listed below for reference.    Significant Diagnostic Studies: Ct Head Wo Contrast  05/15/2015  CLINICAL DATA:  Patient comes from [employer]. Patient has been on suicide watch. They have been trying to wean him off Ativan. When EMS arrived patient was unresponsive.  Pupils were non reactive. Seizures. EXAM: CT HEAD WITHOUT CONTRAST TECHNIQUE: Contiguous axial images were obtained from the base of the skull through the vertex without intravenous contrast. COMPARISON:  04/30/2015 MRI FINDINGS: There is no intra or extra-axial fluid collection or mass lesion. The basilar cisterns and ventricles have a normal appearance. There is no CT evidence for acute infarction or hemorrhage. Bone windows show mucoperiosteal thickening of the right maxillary sinus. No acute calvarial abnormality. IMPRESSION: No evidence for acute intracranial abnormality. Mild changes of chronic sinusitis. Electronically Signed   By: Norva Pavlov M.D.   On: 05/15/2015 14:28   Mr Brain Wo Contrast  05/23/2015  CLINICAL DATA:  25 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 Questionable epileptic seizure at jail. Benzodiazepine withdrawal. Subsequent encounter. EXAM: MRI HEAD WITHOUT CONTRAST TECHNIQUE: Multiplanar, multiecho pulse sequences of the brain and surrounding structures were obtained without intravenous contrast. COMPARISON:  05/16/2015 and 04/30/2015 MR. 05/15/2015 and 12/10/2009 CT. FINDINGS: No acute infarct or intracranial hemorrhage. No hydrocephalus or advanced atrophy. Small left choroidal fissure cyst incidentally noted. No evidence of mesial temporal sclerosis. No intracranial mass lesion noted on this unenhanced exam. Major intracranial vascular structures are patent. Minimal partial opacification left mastoid air cells without obstructing lesion of the eustachian tube noted. Polypoid structure right maxillary sinus spanning over 2 cm may be a retention cyst and without change. Cervical medullary junction, pituitary region, pineal region and orbital structures unremarkable. IMPRESSION: No acute intracranial abnormality noted on this unenhanced exam as noted above. Electronically Signed   By: Lacy Duverney M.D.   On: 05/23/2015 18:50   Mr Laqueta Jean GE Contrast  05/16/2015   CLINICAL DATA:  25 year old male with witnessed seizure in jail, unresponsive. Query meningitis. Initial encounter. EXAM: MRI HEAD WITHOUT AND WITH CONTRAST TECHNIQUE: Multiplanar, multiecho pulse sequences of the brain and surrounding structures were obtained without and with intravenous contrast. CONTRAST:  20mL MULTIHANCE GADOBENATE DIMEGLUMINE 529 MG/ML IV SOLN COMPARISON:  [facility] head CT without contrast 04/1915. Brain MRI without and with contrast 04/30/2015, and earlier. FINDINGS: Major intracranial vascular flow voids are stable. No restricted diffusion to suggest acute infarction. No midline shift, mass effect, evidence of mass lesion, ventriculomegaly, extra-axial collection or acute intracranial hemorrhage. Cervicomedullary junction and pituitary are within normal limits. Wallace Cullens and white matter signal remains normal. Hippocampal formations appear stable and within normal limits. No abnormal enhancement identified.  No dural thickening or leptomeningeal enhancement is identified. Stable enhancement of the major intracranial venous structures. Visible internal auditory structures appear normal. Mastoids are clear. Mild right maxillary sinus mucosal thickening is unchanged, otherwise paranasal sinuses are clear. Orbit and scalp soft tissues remain within normal limits. Visualized bone marrow signal is within normal limits. Negative visualized cervical spine. IMPRESSION: Stable and normal MRI appearance of the brain. CSF analysis would be necessary to exclude meningitis. Electronically Signed   By: Odessa Fleming M.D.   On: 05/16/2015 17:51   Mr Cervical Spine Wo Contrast  05/23/2015  CLINICAL DATA:  25 year old male with depression with subsequent catatonic state. Possible benzodiazepine withdrawal. Possible seizure. Subsequent encounter. EXAM: MRI CERVICAL AND THORACIC SPINE WITHOUT CONTRAST TECHNIQUE: Multiplanar and multiecho pulse sequences of the cervical spine, to include the  craniocervical junction and cervicothoracic junction, and thoracic spine, were obtained without intravenous contrast. COMPARISON:  No comparison cervical thoracic spine MR. Three recent brain MRs most recent performed just before cervical and thoracic spine MR and dictated separately. FINDINGS: MRI CERVICAL SPINE FINDINGS Patient is intubated with nasogastric tube in place. Cervical medullary junction unremarkable. No focal cervical cord signal abnormality. Edema posterior paraspinal musculature of questionable etiology. No osseous edema to suggest acute osseous injury. Pooling of secretions. Additionally, minimal amount of retropharyngeal edema. Etiology indeterminate. C2-3:  Negative. C3-4:  Negative. C4-5:  Negative. C5-6: Tiny right paracentral protrusion C6-7:  Very small left paracentral protrusion. C7-T1: Negative. MRI THORACIC SPINE FINDINGS Exam is motion degraded. Nonspecific posterior paravertebral edema extends into the upper thoracic region. No definitive focal thoracic cord signal abnormality noted. T1-2:  Negative. T2-3: Shallow left posterior lateral protrusion with slight cephalad extension with mild flattening left aspect of the cord. T3-4: Small left paracentral protrusion mild flattening left aspect of the cord. T4-5: Tiny left paracentral protrusion/ bulge with mild narrowing ventral thecal sac without cord flattening. T5-6: Moderate size focal right paracentral protrusion with flattening right aspect of the cord. T6-7:  Bulge.  Mild narrowing ventral thecal sac. T7-8: Minimal to mild bulge greater to left. Mild narrowing ventral thecal sac. T8-9: Small left paracentral protrusion with mild left-sided cord flattening. T9-10:  Negative. T10-11: Negative. T11-12: Negative. T12-L1: Negative. IMPRESSION: MRI CERVICAL No focal cervical cord signal abnormality. Edema posterior paraspinal musculature of questionable etiology. Pooling of secretions. Additionally, minimal amount of retropharyngeal edema.  Etiology indeterminate. C5-6 tiny right paracentral protrusion C6-7 very small left paracentral protrusion. MRI THORACIC SPINE Exam is motion degraded. Nonspecific posterior paravertebral edema extends into the upper thoracic region. No definitive focal thoracic cord signal abnormality noted. Several disc protrusions largest on the right at the T5-6 level. Summary of pertinent findings includes: T2-3 shallow left posterior lateral protrusion with slight cephalad extension with mild flattening left aspect of the cord. T3-4 small left paracentral protrusion with mild flattening left aspect of the cord. T4-5 tiny left paracentral protrusion/ bulge with mild narrowing ventral thecal sac without cord flattening. T5-6 moderate size focal right paracentral protrusion with flattening right aspect of the cord. T6-7 bulge.  Mild narrowing ventral thecal sac. T7-8 minimal to mild bulge greater to left. Mild narrowing ventral thecal sac. T8-9 small left paracentral protrusion with mild left-sided cord flattening. Electronically Signed   By: Lacy Duverney M.D.   On: 05/23/2015 19:13   Mr Thoracic Spine Wo Contrast  05/23/2015  CLINICAL DATA:  25 year old male with depression with subsequent catatonic state. Possible benzodiazepine withdrawal. Possible seizure. Subsequent encounter. EXAM: MRI CERVICAL AND THORACIC SPINE WITHOUT CONTRAST  TECHNIQUE: Multiplanar and multiecho pulse sequences of the cervical spine, to include the craniocervical junction and cervicothoracic junction, and thoracic spine, were obtained without intravenous contrast. COMPARISON:  No comparison cervical thoracic spine MR. Three recent brain MRs most recent performed just before cervical and thoracic spine MR and dictated separately. FINDINGS: MRI CERVICAL SPINE FINDINGS Patient is intubated with nasogastric tube in place. Cervical medullary junction unremarkable. No focal cervical cord signal abnormality. Edema posterior paraspinal musculature of  questionable etiology. No osseous edema to suggest acute osseous injury. Pooling of secretions. Additionally, minimal amount of retropharyngeal edema. Etiology indeterminate. C2-3:  Negative. C3-4:  Negative. C4-5:  Negative. C5-6: Tiny right paracentral protrusion C6-7:  Very small left paracentral protrusion. C7-T1: Negative. MRI THORACIC SPINE FINDINGS Exam is motion degraded. Nonspecific posterior paravertebral edema extends into the upper thoracic region. No definitive focal thoracic cord signal abnormality noted. T1-2:  Negative. T2-3: Shallow left posterior lateral protrusion with slight cephalad extension with mild flattening left aspect of the cord. T3-4: Small left paracentral protrusion mild flattening left aspect of the cord. T4-5: Tiny left paracentral protrusion/ bulge with mild narrowing ventral thecal sac without cord flattening. T5-6: Moderate size focal right paracentral protrusion with flattening right aspect of the cord. T6-7:  Bulge.  Mild narrowing ventral thecal sac. T7-8: Minimal to mild bulge greater to left. Mild narrowing ventral thecal sac. T8-9: Small left paracentral protrusion with mild left-sided cord flattening. T9-10:  Negative. T10-11: Negative. T11-12: Negative. T12-L1: Negative. IMPRESSION: MRI CERVICAL No focal cervical cord signal abnormality. Edema posterior paraspinal musculature of questionable etiology. Pooling of secretions. Additionally, minimal amount of retropharyngeal edema. Etiology indeterminate. C5-6 tiny right paracentral protrusion C6-7 very small left paracentral protrusion. MRI THORACIC SPINE Exam is motion degraded. Nonspecific posterior paravertebral edema extends into the upper thoracic region. No definitive focal thoracic cord signal abnormality noted. Several disc protrusions largest on the right at the T5-6 level. Summary of pertinent findings includes: T2-3 shallow left posterior lateral protrusion with slight cephalad extension with mild flattening left  aspect of the cord. T3-4 small left paracentral protrusion with mild flattening left aspect of the cord. T4-5 tiny left paracentral protrusion/ bulge with mild narrowing ventral thecal sac without cord flattening. T5-6 moderate size focal right paracentral protrusion with flattening right aspect of the cord. T6-7 bulge.  Mild narrowing ventral thecal sac. T7-8 minimal to mild bulge greater to left. Mild narrowing ventral thecal sac. T8-9 small left paracentral protrusion with mild left-sided cord flattening. Electronically Signed   By: Lacy Duverney M.D.   On: 05/23/2015 19:13   Ct Abdomen Pelvis W Contrast  05/15/2015  CLINICAL DATA:  Patient comes from [employer]. Patient has been on suicide watch. They have been trying to wean him off Ativan. When EMS arrived patient was unresponsive. Pupils were non reactive. Seizures. EXAM: CT ABDOMEN AND PELVIS WITH CONTRAST TECHNIQUE: Multidetector CT imaging of the abdomen and pelvis was performed using the standard protocol following bolus administration of intravenous contrast. CONTRAST:  OMNIPAQUE IOHEXOL 300 MG/ML  SOLN COMPARISON:  None. FINDINGS: Lower chest: There patchy densities at the lung bases bilaterally, consistent atelectasis or early infiltrates. Heart size is normal. No imaged pericardial effusion or significant coronary artery calcifications. Upper abdomen: Artifact from the patient's arms at his sides. No focal abnormality identified within the liver, spleen, pancreas, or adrenal glands. Gallbladder is present. Kidneys show symmetric bilateral enhancement. No hydronephrosis or renal mass. Gastrointestinal tract: Nasogastric tube in place to the stomach. Stomach has a normal  appearance. Small bowel loops are normal in appearance. The appendix is well seen and has a normal appearance. Colonic loops are normal in appearance. Pelvis: Foley catheter decompresses the bladder. Air is identified within the urinary bladder. No free pelvic fluid.  Retroperitoneum: No evidence for aortic aneurysm. No retroperitoneal or mesenteric adenopathy. Abdominal wall: Unremarkable. Osseous structures: Unremarkable. IMPRESSION: No evidence for acute  abnormality. Patchy densities at the lung bases bilaterally, left greater than right raising the question of early infiltrate versus atelectasis. Foley catheter.  Nasogastric tube. Electronically Signed   By: Norva Pavlov M.D.   On: 05/15/2015 14:34   Dg Chest Port 1 View  05/26/2015  CLINICAL DATA:  Respiratory abnormalities. EXAM: PORTABLE CHEST 1 VIEW COMPARISON:  05/24/2015 FINDINGS: Endotracheal tube has been removed. Feeding tube is in place, tip overlying the region of the stomach. Shallow lung inflation. There is minimal left basilar atelectasis. Heart size is upper limits normal. No focal consolidations or evidence for pulmonary edema. IMPRESSION: 1. Shallow inflation. 2. Left lower lobe atelectasis. Electronically Signed   By: Norva Pavlov M.D.   On: 05/26/2015 15:39   Dg Chest Port 1 View  05/24/2015  CLINICAL DATA:  25 year old male status post intubation. EXAM: PORTABLE CHEST 1 VIEW COMPARISON:  Multiple priors, most recently 05/23/2015. FINDINGS: An endotracheal tube is in place with tip 3.7 cm above the carina. Nasogastric tube extends into the antrum of the stomach. Lung volumes are low. No consolidative airspace disease. No pleural effusions. No pneumothorax. No pulmonary nodule or mass noted. Pulmonary vasculature and the cardiomediastinal silhouette are within normal limits. IMPRESSION: 1. Support apparatus, as above. 2. Low lung volumes without radiographic evidence of acute cardiopulmonary disease. Electronically Signed   By: Trudie Reed M.D.   On: 05/24/2015 07:46   Dg Chest Port 1 View  05/23/2015  CLINICAL DATA:  Hypoxia EXAM: PORTABLE CHEST 1 VIEW COMPARISON:  May 21, 2015 FINDINGS: Endotracheal tube tip is 2.0 cm above the carina. Nasogastric tube tip and side port are in  the stomach. No pneumothorax. There is patchy atelectasis in the left base. The lungs elsewhere clear. Heart is borderline enlarged with pulmonary vascularity within normal limits. No adenopathy. No bone lesions. IMPRESSION: Tube positions as described without pneumothorax. Left base atelectasis. Lungs elsewhere clear. No change in cardiac silhouette. Electronically Signed   By: Bretta Bang III M.D.   On: 05/23/2015 07:42   Dg Chest Port 1 View  05/21/2015  CLINICAL DATA:  Acute respiratory failure EXAM: PORTABLE CHEST 1 VIEW COMPARISON:  May 20, 2015 FINDINGS: The ETT terminates 16 mm above the carina. Recommend withdrawing 1 cm. No pneumothorax. No other interval changes. IMPRESSION: Low-lying ETT, 16 mm above the carina. Recommend withdrawing 1 cm. No other interval change. Electronically Signed   By: Gerome Sam III M.D   On: 05/21/2015 07:24   Dg Chest Port 1 View  05/20/2015  CLINICAL DATA:  Seizure. EXAM: PORTABLE CHEST 1 VIEW COMPARISON:  05/17/2015. FINDINGS: Endotracheal tube, NG tube in stable position. Low lung volumes with progressive basilar atelectasis and/or infiltrates. Small left pleural effusion. No pneumothorax. Heart size stable No pulmonary venous congestion. No pneumothorax. IMPRESSION: 1. Lines and tubes in stable position. 2. Low lung volumes with progressive bibasilar atelectasis and/or infiltrates. Small left pleural effusion. Electronically Signed   By: Maisie Fus  Register   On: 05/20/2015 08:00   Dg Chest Port 1 View  05/17/2015  CLINICAL DATA:  Acute respiratory failure EXAM: PORTABLE CHEST 1 VIEW COMPARISON:  05/16/2015  FINDINGS: Endotracheal tube and NG tube are unchanged. There are bibasilar opacities, likely atelectasis. Suspect small left pleural effusion. Heart is borderline in size. No acute bony abnormality. IMPRESSION: Bibasilar opacities, likely atelectasis.  Small left effusion. Electronically Signed   By: Charlett Nose M.D.   On: 05/17/2015 07:40   Dg  Chest Port 1 View  05/16/2015  CLINICAL DATA:  25 year old male with respiratory failure. Subsequent encounter. EXAM: PORTABLE CHEST 1 VIEW COMPARISON:  05/15/2015. FINDINGS: Endotracheal tube tip 3 cm above the carina. Nasogastric tube courses below the diaphragm. Tip is not included on the present exam. Consolidation left base may represent atelectasis, infiltrate and/ or pleural effusion. Follow-up until clearance recommended to exclude underlying abnormality. Mild central pulmonary vascular prominence. No gross pneumothorax. Heart size top-normal. IMPRESSION: Consolidation left base may represent atelectasis. Infiltrate and/or pleural effusions secondary considerations. Electronically Signed   By: Lacy Duverney M.D.   On: 05/16/2015 07:33   Dg Chest Port 1 View  05/15/2015  CLINICAL DATA:  Acute respiratory failure. Unresponsive. Intubated. EXAM: PORTABLE CHEST 1 VIEW COMPARISON:  None. FINDINGS: 1920 hours. Endotracheal tube tip terminates 2.2 cm above the carina. Nasogastric tube projects below the diaphragm, tip at the level of the distal stomach. There are low lung volumes with left-greater-than-right basilar airspace opacities. No pneumothorax or significant pleural effusion identified. There is no evidence of rib fracture. IMPRESSION: 1. The endotracheal and nasogastric tubes are satisfactorily positioned. 2. Left-greater-than-right basilar airspace opacities suspicious for possible aspiration. Electronically Signed   By: Carey Bullocks M.D.   On: 05/15/2015 19:38   Dg Chest Portable 1 View  05/15/2015  CLINICAL DATA:  Unresponsive EXAM: PORTABLE CHEST 1 VIEW COMPARISON:  05/15/2015 1200 hours FINDINGS: Endotracheal tube placed. Tip is 2.5 cm from the carina. Low volumes. Bibasilar atelectasis. No pneumothorax. Normal heart size. IMPRESSION: Endotracheal tube placed.  Bibasilar atelectasis persists Electronically Signed   By: Jolaine Click M.D.   On: 05/15/2015 13:37   Dg Abd Portable  1v  05/24/2015  CLINICAL DATA:  Feeding tube placement. EXAM: PORTABLE ABDOMEN - 1 VIEW COMPARISON:  05/17/2015. FINDINGS: Feeding tube courses through the stomach with its tip in the 4th portion of the duodenum at or near the ligament of Treitz. Bowel gas pattern unremarkable. IMPRESSION: 1. Feeding tube tip in the 4th portion of the duodenum at or near the ligament of Treitz. The tube is ready for use. 2. No acute abdominal abnormality. Electronically Signed   By: Hulan Saas M.D.   On: 05/24/2015 12:54   Dg Abd Portable 1v  05/17/2015  CLINICAL DATA:  Orogastric tube placement EXAM: PORTABLE ABDOMEN - 1 VIEW COMPARISON:  Abdominal CT 05/15/2015 FINDINGS: Orogastric tube tip at pyloric region. Normal bowel gas pattern. Atelectatic type opacities at the left base, as seen on CT 05/15/2015 IMPRESSION: Orogastric tube tip at the pylorus. Electronically Signed   By: Marnee Spring M.D.   On: 05/17/2015 17:06   Dg Fluoro Guide Lumbar Puncture  05/17/2015  CLINICAL DATA:  Altered mental status, concern for meningitis. EXAM: DIAGNOSTIC LUMBAR PUNCTURE UNDER FLUOROSCOPIC GUIDANCE FLUOROSCOPY TIME:  Radiation Exposure Index (as provided by the fluoroscopic device): If the device does not provide the exposure index: Fluoroscopy Time (in minutes and seconds):  1 minutes 30 seconds Number of Acquired Images:  1 PROCEDURE: Intubated patient. Informed consent was obtained from the patient's parents to the procedure, including potential complications of headache, allergy, and pain. With the patient prone, the lower back was prepped with Betadine. 1%  Lidocaine was used for local anesthesia. Lumbar puncture was performed at the L3-L4 level using a 20 gauge needle with return of clear CSF with an opening pressure of 21 cm water. Nine ml of CSF were obtained for laboratory studies. The patient tolerated the procedure well and there were no apparent complications. IMPRESSION: Successful lumbar puncture. Electronically  Signed   By: Genevive BiStewart  Edmunds M.D.   On: 05/17/2015 15:38    Microbiology: Recent Results (from the past 240 hour(s))  Culture, blood (Routine X 2) w Reflex to ID Panel     Status: None   Collection Time: 06/07/15  7:10 AM  Result Value Ref Range Status   Specimen Description BLOOD LEFT HAND  Final   Special Requests IN PEDIATRIC BOTTLE 3CC  Final   Culture NO GROWTH 5 DAYS  Final   Report Status 06/12/2015 FINAL  Final  Culture, blood (Routine X 2) w Reflex to ID Panel     Status: None   Collection Time: 06/07/15  7:20 AM  Result Value Ref Range Status   Specimen Description BLOOD RIGHT ANTECUBITAL  Final   Special Requests BOTTLES DRAWN AEROBIC ONLY 8CC  Final   Culture NO GROWTH 5 DAYS  Final   Report Status 06/13/2015 FINAL  Final     Labs: Basic Metabolic Panel:  Recent Labs Lab 06/09/15 0555  NA 141  K 4.0  CL 101  CO2 31  GLUCOSE 102*  BUN 10  CREATININE 0.78  CALCIUM 9.4   Liver Function Tests:  Recent Labs Lab 06/09/15 0555  AST 17  ALT 32  ALKPHOS 55  BILITOT 0.6  PROT 5.7*  ALBUMIN 2.5*   No results for input(s): LIPASE, AMYLASE in the last 168 hours. No results for input(s): AMMONIA in the last 168 hours. CBC: No results for input(s): WBC, NEUTROABS, HGB, HCT, MCV, PLT in the last 168 hours. Cardiac Enzymes: No results for input(s): CKTOTAL, CKMB, CKMBINDEX, TROPONINI in the last 168 hours. BNP: BNP (last 3 results) No results for input(s): BNP in the last 8760 hours.  ProBNP (last 3 results) No results for input(s): PROBNP in the last 8760 hours.  CBG:  Recent Labs Lab 06/07/15 1704 06/07/15 2010 06/07/15 2354 06/08/15 0352  GLUCAP 70 76 85 96    Signed:  Penny PiaVEGA, Kenyotta Dorfman MD.  [facility] 06/14/2015, 1:16 PM

## 2015-06-14 NOTE — Progress Notes (Signed)
D/C orders received, pt for D/C to a facility today.  IV and telemetry D/C.  Rx and D/C instructions given with verbalized understanding.  Family at bedside to assist with D/C. EMS brought pt downstairs via stretcher.

## 2015-06-14 NOTE — Progress Notes (Signed)
This am CM met with the patient and his parents and went over the equipment needs for them to take the patient home. Mr Dillards parents asked for: bed, walker, 3 in 1, and wheelchair. This information passed to Dr Wendee Beavers and ordered through [facility]. CM left the parents with a list of [employer] agencies in [facility] and a private duty list. They wanted to look over the list and have CM meet with them later with a decision.  About 1:30 CM went back to meet with Mr Cerritos's parents and they informed CM that a judge had decided the patient was to go to [facility] hospital ward. CM called Lang Snow with security and he was not aware of the new information. CM then attempted to call [facility] with CSW and Sharlet Salina the patients assigned probation officer without success.  CM received a phone call from officers involved in the patients case and they stated they were on their way with paperwork and the patient was to go to [facility] hospital ward. When officers arrived they had the court information that the patient was to go to [facility] and provided CM with the numbers for the hospital ward and how the patient was to transport. Bedside RN called and gave report and information requested was faxed. [facility] was set up for transport. Discharge paperwork and prescriptions given to the officers who were going to follow the patient to [facility]. Home DME cancelled with [facility].

## 2015-06-14 NOTE — Clinical Social Work Note (Addendum)
Clinical Social Worker facilitated patient discharge including contacting patient family and facility to confirm patient discharge plans.  [facility] and family agreeable with plan.  [employer] has arranged ambulance transport via [facility] [place], [place].  RN to call report prior to discharge.  Clinical Social Worker will sign off for now as social work intervention is no longer needed. Please consult us again if new need arises.  Derenda FennelBashira Jemmie Rhinehart, MSW, LCSWA [phone] 06/14/2015 4:28 PM

## 2015-06-14 NOTE — Progress Notes (Signed)
Pt is discharged via [facility].  Family has d/c paperwork.

## 2015-08-08 ENCOUNTER — Telehealth: Payer: Self-pay | Admitting: Family Medicine

## 2015-08-08 NOTE — Telephone Encounter (Signed)
Pt will run out of parlodel after tomorrow.  Pt's mother Larita FifeLynn asked for a refill.  Her call back number is [phone]

## 2015-08-08 NOTE — Telephone Encounter (Signed)
Pt's mother Larita FifeLynn requested a call [phone]

## 2015-08-08 NOTE — Telephone Encounter (Signed)
Pt was recently in the hospital and was treated by Dr. Manuela NeptuneIman.  Pt's mother Larita FifeLynn said Dr. Manuela NeptuneIman requested a call to update Dr. Juanetta GoslingHawkins on pt's medications.  He can be reached at [phone], [phone] or [phone]

## 2015-08-10 DIAGNOSIS — Z87820 Personal history of traumatic brain injury: Secondary | ICD-10-CM | POA: Diagnosis not present

## 2015-08-10 DIAGNOSIS — M542 Cervicalgia: Secondary | ICD-10-CM | POA: Diagnosis not present

## 2015-08-10 DIAGNOSIS — R569 Unspecified convulsions: Secondary | ICD-10-CM | POA: Diagnosis not present

## 2015-08-10 DIAGNOSIS — G4733 Obstructive sleep apnea (adult) (pediatric): Secondary | ICD-10-CM | POA: Diagnosis not present

## 2015-08-11 ENCOUNTER — Other Ambulatory Visit: Payer: Self-pay | Admitting: Family Medicine

## 2015-08-11 MED ORDER — AMANTADINE HCL 100 MG PO CAPS: 100.0000 mg | ORAL_CAPSULE | Freq: Three times a day (TID) | ORAL | Status: DC

## 2015-08-15 ENCOUNTER — Ambulatory Visit: Payer: Self-pay | Admitting: Family Medicine

## 2015-08-15 ENCOUNTER — Encounter: Payer: Self-pay | Admitting: Family Medicine

## 2015-08-15 ENCOUNTER — Ambulatory Visit (INDEPENDENT_AMBULATORY_CARE_PROVIDER_SITE_OTHER): Payer: BLUE CROSS/BLUE SHIELD | Admitting: Family Medicine

## 2015-08-15 VITALS — BP 117/78 | HR 94 | Temp 97.9°F | Resp 16 | Ht 69.0 in | Wt 274.0 lb

## 2015-08-15 DIAGNOSIS — F419 Anxiety disorder, unspecified: Secondary | ICD-10-CM | POA: Insufficient documentation

## 2015-08-15 DIAGNOSIS — L309 Dermatitis, unspecified: Secondary | ICD-10-CM

## 2015-08-15 DIAGNOSIS — G9340 Encephalopathy, unspecified: Secondary | ICD-10-CM | POA: Diagnosis not present

## 2015-08-15 DIAGNOSIS — G210 Malignant neuroleptic syndrome: Secondary | ICD-10-CM

## 2015-08-15 DIAGNOSIS — F329 Major depressive disorder, single episode, unspecified: Secondary | ICD-10-CM

## 2015-08-15 DIAGNOSIS — F32A Depression, unspecified: Secondary | ICD-10-CM

## 2015-08-15 MED ORDER — BROMOCRIPTINE MESYLATE 2.5 MG PO TABS: 2.5000 mg | ORAL_TABLET | Freq: Four times a day (QID) | ORAL | Status: DC

## 2015-08-15 MED ORDER — METOPROLOL TARTRATE 25 MG PO TABS: 12.5000 mg | ORAL_TABLET | Freq: Two times a day (BID) | ORAL | Status: DC

## 2015-08-15 MED ORDER — CLONAZEPAM 0.5 MG PO TABS: ORAL_TABLET | ORAL | Status: DC

## 2015-08-15 MED ORDER — BENZTROPINE MESYLATE 1 MG PO TABS: 1.0000 mg | ORAL_TABLET | Freq: Two times a day (BID) | ORAL | Status: DC

## 2015-08-15 MED ORDER — AMANTADINE HCL 100 MG PO CAPS: 100.0000 mg | ORAL_CAPSULE | Freq: Three times a day (TID) | ORAL | Status: DC

## 2015-08-15 MED ORDER — BACLOFEN 10 MG PO TABS: 10.0000 mg | ORAL_TABLET | Freq: Three times a day (TID) | ORAL | Status: DC

## 2015-08-15 MED ORDER — PANTOPRAZOLE SODIUM 40 MG PO TBEC: 40.0000 mg | DELAYED_RELEASE_TABLET | Freq: Every day | ORAL | Status: DC

## 2015-08-15 MED ORDER — KETOCONAZOLE 2 % EX SHAM: 1.0000 "application " | MEDICATED_SHAMPOO | CUTANEOUS | Status: DC

## 2015-08-15 NOTE — Patient Instructions (Signed)
[name] needs referral to Neurologist and [place] in [place].  This w\ill be arranged ASAP.

## 2015-08-15 NOTE — Progress Notes (Signed)
Name: Chase Hampton   MRN: [id number]    DOB: 1990/08/17   Date:08/15/2015       Progress Note  Subjective  Chief Complaint  Chief Complaint  Patient presents with  . 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  He will be living in [facility] as mandated by law for the time being.  He will need Psychiatric and Neurologic speciality services,, probably in [place] in the near future.  Will then transfer all medical care there when available.  I will write mweds until he can establish these two specialists there.  No problem-specific assessment & plan notes found for this encounter.   Past Medical History  Diagnosis Date  . GERD (gastroesophageal reflux disease)   . Anxiety   . Depression     Past Surgical History  Procedure Laterality Date  . No past surgeries      Family History  Problem Relation Age of Onset  . Heart disease Maternal Grandfather   . Diabetes Maternal Grandfather   . Heart disease Paternal Grandmother     Social History   Social History  . Marital Status: Single    Spouse Name: N/A  . Number of Children: N/A  . Years of Education: N/A   Occupational History  . Not on file.   Social History Main Topics  . Smoking status: Former Smoker    Types: Cigars  . Smokeless tobacco: Never Used  . Alcohol Use: No  . Drug Use: Yes    Special: Marijuana  . Sexual Activity: No   Other Topics Concern  . Not on file   Social History Narrative     Current outpatient prescriptions:  .  amantadine (SYMMETREL) 100 MG capsule, Take 1 capsule (100 mg total) by mouth 3 (three) times daily., Disp: 90 capsule, Rfl: 5 .  baclofen (LIORESAL) 10 MG tablet, Take 1 tablet (10 mg total) by mouth 3 (three) times daily., Disp: 90 tablet, Rfl: 5 .  benztropine (COGENTIN) 1 MG tablet, Take 1 tablet (1 mg total) by mouth 2 (two) times daily., Disp: 60 tablet, Rfl: 5 .   bromocriptine (PARLODEL) 2.5 MG tablet, Place 1 tablet (2.5 mg total) into feeding tube 4 (four) times daily., Disp: 120 tablet, Rfl: 5 .  clonazePAM (KLONOPIN) 0.5 MG tablet, Take 1 tablet in AM and 1/2 tablet in PM as needed for anxiety, Disp: 45 tablet, Rfl: 3 .  metoprolol tartrate (LOPRESSOR) 25 MG tablet, Take 0.5 tablets (12.5 mg total) by mouth 2 (two) times daily., Disp: 60 tablet, Rfl: 5 .  pantoprazole (PROTONIX) 40 MG tablet, Take 1 tablet (40 mg total) by mouth daily., Disp: 30 tablet, Rfl: 5 .  ketoconazole (NIZORAL) 2 % shampoo, Apply 1 application topically 2 (two) times a week., Disp: 120 mL, Rfl: 3  Allergies  Allergen Reactions  . Haldol [Haloperidol Lactate] Other (See Comments)    Other reaction(s): Other (See Comments) Angioedema [facility] lists no allergies as of 05/15/15) Angioedema [facility] lists no allergies as of 05/15/15)     Review of Systems  Constitutional: Positive for weight loss and malaise/fatigue. Negative for fever and chills.  HENT: Negative for congestion, hearing loss and sore throat.   Eyes: Positive for blurred vision. Negative for double vision and pain.  Respiratory: Negative for cough, shortness of breath and wheezing.   Cardiovascular: Negative for chest pain, palpitations and leg swelling.  Gastrointestinal: Negative for heartburn, abdominal pain, diarrhea, constipation and blood in stool.  Genitourinary: Negative for dysuria, urgency and frequency.  Musculoskeletal: Positive for neck pain (L neck trap).  Skin: Positive for rash (in beard).  Neurological: Positive for dizziness, tremors (mild), weakness and headaches.  Psychiatric/Behavioral: Positive for depression. The patient is nervous/anxious and has insomnia (mild).       Objective  Filed Vitals:   08/15/15 1458  BP: 117/78  Pulse: 94  Temp: 97.9 F (36.6 C)  TempSrc: Oral  Resp: 16  Height: 5\' 9"  (1.753 m)  Weight: 274 lb (124.286 kg)    Physical  Exam  Constitutional: He is oriented to person, place, and time and well-developed, well-nourished, and in no distress. No distress.  HENT:  Head: Normocephalic and atraumatic.  Eyes: Conjunctivae are normal. Pupils are equal, round, and reactive to light. No scleral icterus.  Neck: Normal range of motion. Neck supple. Carotid bruit is not present. No thyromegaly present.  Cardiovascular: Normal rate, regular rhythm and normal heart sounds.  Exam reveals no gallop and no friction rub.   No murmur heard. Pulmonary/Chest: Breath sounds normal. No respiratory distress. He has no wheezes. He has no rales.  Abdominal: Soft. Bowel sounds are normal. He exhibits no distension, no abdominal bruit and no mass. There is no tenderness.  Musculoskeletal: He exhibits edema (trace non pitting edema in feet and legs bilaterally).  Tender L shoulder and neck trap.  Lymphadenopathy:    He has no cervical adenopathy.  Neurological: He is alert and oriented to person, place, and time.  Skin:  Eczema of beard area.  Vitals reviewed.         Assessment & Plan  Problem List Items Addressed This Visit      Nervous and Auditory   Acute encephalopathy - Primary   Relevant Medications   amantadine (SYMMETREL) 100 MG capsule   metoprolol tartrate (LOPRESSOR) 25 MG tablet   pantoprazole (PROTONIX) 40 MG tablet   Neuroleptic malignant syndrome   Relevant Medications   baclofen (LIORESAL) 10 MG tablet   benztropine (COGENTIN) 1 MG tablet   bromocriptine (PARLODEL) 2.5 MG tablet   Other Relevant Orders   Ambulatory referral to Physical Therapy     Musculoskeletal and Integument   Eczema   Relevant Medications   ketoconazole (NIZORAL) 2 % shampoo     Other   Depression   Acute anxiety   Relevant Medications   clonazePAM (KLONOPIN) 0.5 MG tablet      Meds ordered this encounter  Medications  . DISCONTD: metoprolol tartrate (LOPRESSOR) 25 MG tablet    Sig: Take 12.5 mg by mouth 2 (two)  times daily.   Marland Kitchen. DISCONTD: pantoprazole (PROTONIX) 40 MG tablet    Sig: Take 40 mg by mouth daily.  Marland Kitchen. amantadine (SYMMETREL) 100 MG capsule    Sig: Take 1 capsule (100 mg total) by mouth 3 (three) times daily.    Dispense:  90 capsule    Refill:  5  . baclofen (LIORESAL) 10 MG tablet    Sig: Take 1 tablet (10 mg total) by mouth 3 (three) times daily.    Dispense:  90 tablet    Refill:  5  . benztropine (COGENTIN) 1 MG tablet    Sig: Take 1 tablet (1 mg total) by mouth 2 (two) times daily.    Dispense:  60 tablet    Refill:  5  . bromocriptine (PARLODEL) 2.5 MG tablet    Sig: Place 1 tablet (2.5  mg total) into feeding tube 4 (four) times daily.    Dispense:  120 tablet    Refill:  5  . clonazePAM (KLONOPIN) 0.5 MG tablet    Sig: Take 1 tablet in AM and 1/2 tablet in PM as needed for anxiety    Dispense:  45 tablet    Refill:  3  . metoprolol tartrate (LOPRESSOR) 25 MG tablet    Sig: Take 0.5 tablets (12.5 mg total) by mouth 2 (two) times daily.    Dispense:  60 tablet    Refill:  5  . pantoprazole (PROTONIX) 40 MG tablet    Sig: Take 1 tablet (40 mg total) by mouth daily.    Dispense:  30 tablet    Refill:  5  . ketoconazole (NIZORAL) 2 % shampoo    Sig: Apply 1 application topically 2 (two) times a week.    Dispense:  120 mL    Refill:  3   1. Acute encephalopathy  - amantadine (SYMMETREL) 100 MG capsule; Take 1 capsule (100 mg total) by mouth 3 (three) times daily.  Dispense: 90 capsule; Refill: 5 - metoprolol tartrate (LOPRESSOR) 25 MG tablet; Take 0.5 tablets (12.5 mg total) by mouth 2 (two) times daily.  Dispense: 60 tablet; Refill: 5 - pantoprazole (PROTONIX) 40 MG tablet; Take 1 tablet (40 mg total) by mouth daily.  Dispense: 30 tablet; Refill: 5  2. Neuroleptic malignant syndrome  - baclofen (LIORESAL) 10 MG tablet; Take 1 tablet (10 mg total) by mouth 3 (three) times daily.  Dispense: 90 tablet; Refill: 5 - benztropine (COGENTIN) 1 MG tablet; Take 1 tablet (1 mg  total) by mouth 2 (two) times daily.  Dispense: 60 tablet; Refill: 5 - bromocriptine (PARLODEL) 2.5 MG tablet; Place 1 tablet (2.5 mg total) into feeding tube 4 (four) times daily.  Dispense: 120 tablet; Refill: 5 - Ambulatory referral to Physical Therapy  3. Depression   4. Acute anxiety  - clonazePAM (KLONOPIN) 0.5 MG tablet; Take 1 tablet in AM and 1/2 tablet in PM as needed for anxiety  Dispense: 45 tablet; Refill: 3  5. Eczema  - ketoconazole (NIZORAL) 2 % shampoo; Apply 1 application topically 2 (two) times a week.  Dispense: 120 mL; Refill: 3

## 2015-08-16 DIAGNOSIS — M542 Cervicalgia: Secondary | ICD-10-CM | POA: Diagnosis not present

## 2015-08-16 DIAGNOSIS — M6281 Muscle weakness (generalized): Secondary | ICD-10-CM | POA: Diagnosis not present

## 2015-08-19 DIAGNOSIS — M6281 Muscle weakness (generalized): Secondary | ICD-10-CM | POA: Diagnosis not present

## 2015-08-19 DIAGNOSIS — M542 Cervicalgia: Secondary | ICD-10-CM | POA: Diagnosis not present

## 2015-08-23 DIAGNOSIS — M6281 Muscle weakness (generalized): Secondary | ICD-10-CM | POA: Diagnosis not present

## 2015-08-23 DIAGNOSIS — M542 Cervicalgia: Secondary | ICD-10-CM | POA: Diagnosis not present

## 2015-08-25 DIAGNOSIS — M542 Cervicalgia: Secondary | ICD-10-CM | POA: Diagnosis not present

## 2015-08-25 DIAGNOSIS — M6281 Muscle weakness (generalized): Secondary | ICD-10-CM | POA: Diagnosis not present

## 2015-08-26 DIAGNOSIS — M542 Cervicalgia: Secondary | ICD-10-CM | POA: Diagnosis not present

## 2015-08-26 DIAGNOSIS — M47812 Spondylosis without myelopathy or radiculopathy, cervical region: Secondary | ICD-10-CM | POA: Diagnosis not present

## 2015-08-26 DIAGNOSIS — T148 Other injury of unspecified body region: Secondary | ICD-10-CM | POA: Diagnosis not present

## 2015-08-26 DIAGNOSIS — M50222 Other cervical disc displacement at C5-C6 level: Secondary | ICD-10-CM | POA: Diagnosis not present

## 2015-08-26 DIAGNOSIS — M2560 Stiffness of unspecified joint, not elsewhere classified: Secondary | ICD-10-CM | POA: Diagnosis not present

## 2015-08-26 DIAGNOSIS — M50322 Other cervical disc degeneration at C5-C6 level: Secondary | ICD-10-CM | POA: Diagnosis not present

## 2015-08-29 DIAGNOSIS — M6281 Muscle weakness (generalized): Secondary | ICD-10-CM | POA: Diagnosis not present

## 2015-08-29 DIAGNOSIS — M542 Cervicalgia: Secondary | ICD-10-CM | POA: Diagnosis not present

## 2015-09-01 DIAGNOSIS — M542 Cervicalgia: Secondary | ICD-10-CM | POA: Diagnosis not present

## 2015-09-01 DIAGNOSIS — M6281 Muscle weakness (generalized): Secondary | ICD-10-CM | POA: Diagnosis not present

## 2015-09-05 DIAGNOSIS — G4733 Obstructive sleep apnea (adult) (pediatric): Secondary | ICD-10-CM | POA: Diagnosis not present

## 2015-09-05 DIAGNOSIS — M6281 Muscle weakness (generalized): Secondary | ICD-10-CM | POA: Diagnosis not present

## 2015-09-05 DIAGNOSIS — M542 Cervicalgia: Secondary | ICD-10-CM | POA: Diagnosis not present

## 2015-09-06 DIAGNOSIS — F6381 Intermittent explosive disorder: Secondary | ICD-10-CM | POA: Diagnosis not present

## 2015-09-06 DIAGNOSIS — S069X9S Unspecified intracranial injury with loss of consciousness of unspecified duration, sequela: Secondary | ICD-10-CM | POA: Diagnosis not present

## 2015-09-06 DIAGNOSIS — F3489 Other specified persistent mood disorders: Secondary | ICD-10-CM | POA: Diagnosis not present

## 2015-09-08 DIAGNOSIS — M542 Cervicalgia: Secondary | ICD-10-CM | POA: Diagnosis not present

## 2015-09-08 DIAGNOSIS — M6281 Muscle weakness (generalized): Secondary | ICD-10-CM | POA: Diagnosis not present

## 2015-09-12 DIAGNOSIS — M542 Cervicalgia: Secondary | ICD-10-CM | POA: Diagnosis not present

## 2015-09-12 DIAGNOSIS — M6281 Muscle weakness (generalized): Secondary | ICD-10-CM | POA: Diagnosis not present

## 2015-09-13 DIAGNOSIS — F3489 Other specified persistent mood disorders: Secondary | ICD-10-CM | POA: Diagnosis not present

## 2015-09-13 DIAGNOSIS — F339 Major depressive disorder, recurrent, unspecified: Secondary | ICD-10-CM | POA: Diagnosis not present

## 2015-09-13 DIAGNOSIS — F6381 Intermittent explosive disorder: Secondary | ICD-10-CM | POA: Diagnosis not present

## 2015-09-15 DIAGNOSIS — M6281 Muscle weakness (generalized): Secondary | ICD-10-CM | POA: Diagnosis not present

## 2015-09-15 DIAGNOSIS — M542 Cervicalgia: Secondary | ICD-10-CM | POA: Diagnosis not present

## 2015-09-19 DIAGNOSIS — M542 Cervicalgia: Secondary | ICD-10-CM | POA: Diagnosis not present

## 2015-09-19 DIAGNOSIS — M6281 Muscle weakness (generalized): Secondary | ICD-10-CM | POA: Diagnosis not present

## 2015-09-22 DIAGNOSIS — F339 Major depressive disorder, recurrent, unspecified: Secondary | ICD-10-CM | POA: Diagnosis not present

## 2015-09-22 DIAGNOSIS — F6381 Intermittent explosive disorder: Secondary | ICD-10-CM | POA: Diagnosis not present

## 2015-09-22 DIAGNOSIS — M6281 Muscle weakness (generalized): Secondary | ICD-10-CM | POA: Diagnosis not present

## 2015-09-22 DIAGNOSIS — F3489 Other specified persistent mood disorders: Secondary | ICD-10-CM | POA: Diagnosis not present

## 2015-09-22 DIAGNOSIS — M542 Cervicalgia: Secondary | ICD-10-CM | POA: Diagnosis not present

## 2015-09-26 DIAGNOSIS — M542 Cervicalgia: Secondary | ICD-10-CM | POA: Diagnosis not present

## 2015-09-26 DIAGNOSIS — M6281 Muscle weakness (generalized): Secondary | ICD-10-CM | POA: Diagnosis not present

## 2015-09-29 DIAGNOSIS — M542 Cervicalgia: Secondary | ICD-10-CM | POA: Diagnosis not present

## 2015-09-29 DIAGNOSIS — M6281 Muscle weakness (generalized): Secondary | ICD-10-CM | POA: Diagnosis not present

## 2015-10-03 ENCOUNTER — Telehealth: Payer: Self-pay | Admitting: Family Medicine

## 2015-10-03 NOTE — Telephone Encounter (Signed)
Called pt would like to know that does he needs to get off any med's at this point. Pt wants Dr. Juanetta GoslingHawkins' suggestion.

## 2015-10-03 NOTE — Telephone Encounter (Signed)
Pt asked for a call back pt asked for a call back concerning med management [phone]

## 2015-10-03 NOTE — Telephone Encounter (Signed)
Done.-jh 

## 2015-10-04 DIAGNOSIS — M542 Cervicalgia: Secondary | ICD-10-CM | POA: Diagnosis not present

## 2015-10-04 DIAGNOSIS — M6281 Muscle weakness (generalized): Secondary | ICD-10-CM | POA: Diagnosis not present

## 2015-10-10 DIAGNOSIS — M542 Cervicalgia: Secondary | ICD-10-CM | POA: Diagnosis not present

## 2015-10-10 DIAGNOSIS — M6281 Muscle weakness (generalized): Secondary | ICD-10-CM | POA: Diagnosis not present

## 2015-10-12 DIAGNOSIS — F3489 Other specified persistent mood disorders: Secondary | ICD-10-CM | POA: Diagnosis not present

## 2015-10-12 DIAGNOSIS — F6381 Intermittent explosive disorder: Secondary | ICD-10-CM | POA: Diagnosis not present

## 2015-10-12 DIAGNOSIS — F339 Major depressive disorder, recurrent, unspecified: Secondary | ICD-10-CM | POA: Diagnosis not present

## 2015-10-13 DIAGNOSIS — M542 Cervicalgia: Secondary | ICD-10-CM | POA: Diagnosis not present

## 2015-10-13 DIAGNOSIS — M6281 Muscle weakness (generalized): Secondary | ICD-10-CM | POA: Diagnosis not present

## 2015-10-20 DIAGNOSIS — G4733 Obstructive sleep apnea (adult) (pediatric): Secondary | ICD-10-CM | POA: Diagnosis not present

## 2015-10-26 DIAGNOSIS — F6381 Intermittent explosive disorder: Secondary | ICD-10-CM | POA: Diagnosis not present

## 2015-10-26 DIAGNOSIS — F339 Major depressive disorder, recurrent, unspecified: Secondary | ICD-10-CM | POA: Diagnosis not present

## 2015-11-10 DIAGNOSIS — F6381 Intermittent explosive disorder: Secondary | ICD-10-CM | POA: Diagnosis not present

## 2015-11-10 DIAGNOSIS — F339 Major depressive disorder, recurrent, unspecified: Secondary | ICD-10-CM | POA: Diagnosis not present

## 2015-11-20 DIAGNOSIS — G4733 Obstructive sleep apnea (adult) (pediatric): Secondary | ICD-10-CM | POA: Diagnosis not present

## 2015-11-21 DIAGNOSIS — F6381 Intermittent explosive disorder: Secondary | ICD-10-CM | POA: Diagnosis not present

## 2015-11-21 DIAGNOSIS — F339 Major depressive disorder, recurrent, unspecified: Secondary | ICD-10-CM | POA: Diagnosis not present

## 2015-12-07 DIAGNOSIS — F6381 Intermittent explosive disorder: Secondary | ICD-10-CM | POA: Diagnosis not present

## 2015-12-07 DIAGNOSIS — F339 Major depressive disorder, recurrent, unspecified: Secondary | ICD-10-CM | POA: Diagnosis not present

## 2015-12-13 DIAGNOSIS — G4733 Obstructive sleep apnea (adult) (pediatric): Secondary | ICD-10-CM | POA: Diagnosis not present

## 2015-12-13 DIAGNOSIS — G210 Malignant neuroleptic syndrome: Secondary | ICD-10-CM | POA: Diagnosis not present

## 2015-12-21 DIAGNOSIS — G4733 Obstructive sleep apnea (adult) (pediatric): Secondary | ICD-10-CM | POA: Diagnosis not present

## 2015-12-22 DIAGNOSIS — F339 Major depressive disorder, recurrent, unspecified: Secondary | ICD-10-CM | POA: Diagnosis not present

## 2015-12-22 DIAGNOSIS — F6381 Intermittent explosive disorder: Secondary | ICD-10-CM | POA: Diagnosis not present

## 2016-01-05 DIAGNOSIS — F3489 Other specified persistent mood disorders: Secondary | ICD-10-CM | POA: Diagnosis not present

## 2016-01-05 DIAGNOSIS — F339 Major depressive disorder, recurrent, unspecified: Secondary | ICD-10-CM | POA: Diagnosis not present

## 2016-01-05 DIAGNOSIS — F6381 Intermittent explosive disorder: Secondary | ICD-10-CM | POA: Diagnosis not present

## 2016-01-13 DIAGNOSIS — F339 Major depressive disorder, recurrent, unspecified: Secondary | ICD-10-CM | POA: Diagnosis not present

## 2016-01-13 DIAGNOSIS — F6381 Intermittent explosive disorder: Secondary | ICD-10-CM | POA: Diagnosis not present

## 2016-01-20 DIAGNOSIS — G4733 Obstructive sleep apnea (adult) (pediatric): Secondary | ICD-10-CM | POA: Diagnosis not present

## 2016-01-27 DIAGNOSIS — F6381 Intermittent explosive disorder: Secondary | ICD-10-CM | POA: Diagnosis not present

## 2016-01-27 DIAGNOSIS — F339 Major depressive disorder, recurrent, unspecified: Secondary | ICD-10-CM | POA: Diagnosis not present

## 2016-02-09 DIAGNOSIS — G4733 Obstructive sleep apnea (adult) (pediatric): Secondary | ICD-10-CM | POA: Diagnosis not present

## 2016-02-09 DIAGNOSIS — G210 Malignant neuroleptic syndrome: Secondary | ICD-10-CM | POA: Diagnosis not present

## 2016-02-19 ENCOUNTER — Other Ambulatory Visit: Payer: Self-pay | Admitting: Family Medicine

## 2016-02-19 DIAGNOSIS — G9340 Encephalopathy, unspecified: Secondary | ICD-10-CM

## 2016-02-27 DIAGNOSIS — F339 Major depressive disorder, recurrent, unspecified: Secondary | ICD-10-CM | POA: Diagnosis not present

## 2016-02-27 DIAGNOSIS — F6381 Intermittent explosive disorder: Secondary | ICD-10-CM | POA: Diagnosis not present

## 2016-03-15 DIAGNOSIS — F339 Major depressive disorder, recurrent, unspecified: Secondary | ICD-10-CM | POA: Diagnosis not present

## 2016-03-15 DIAGNOSIS — F6381 Intermittent explosive disorder: Secondary | ICD-10-CM | POA: Diagnosis not present

## 2016-04-01 IMAGING — DX DG CHEST 1V PORT
1 series · 1 of 1 positions shown · non-contrast
Comparison: 07/23/2014

CLINICAL DATA: Per EMS, Patient comes from [REDACTED] where
they have been trying to ween him off Ativan. When EMS arrived
seizing, EMS is not sure how long he had been seizing prior to
arrival.

EXAM:
PORTABLE CHEST 1 VIEW

[chest ap]
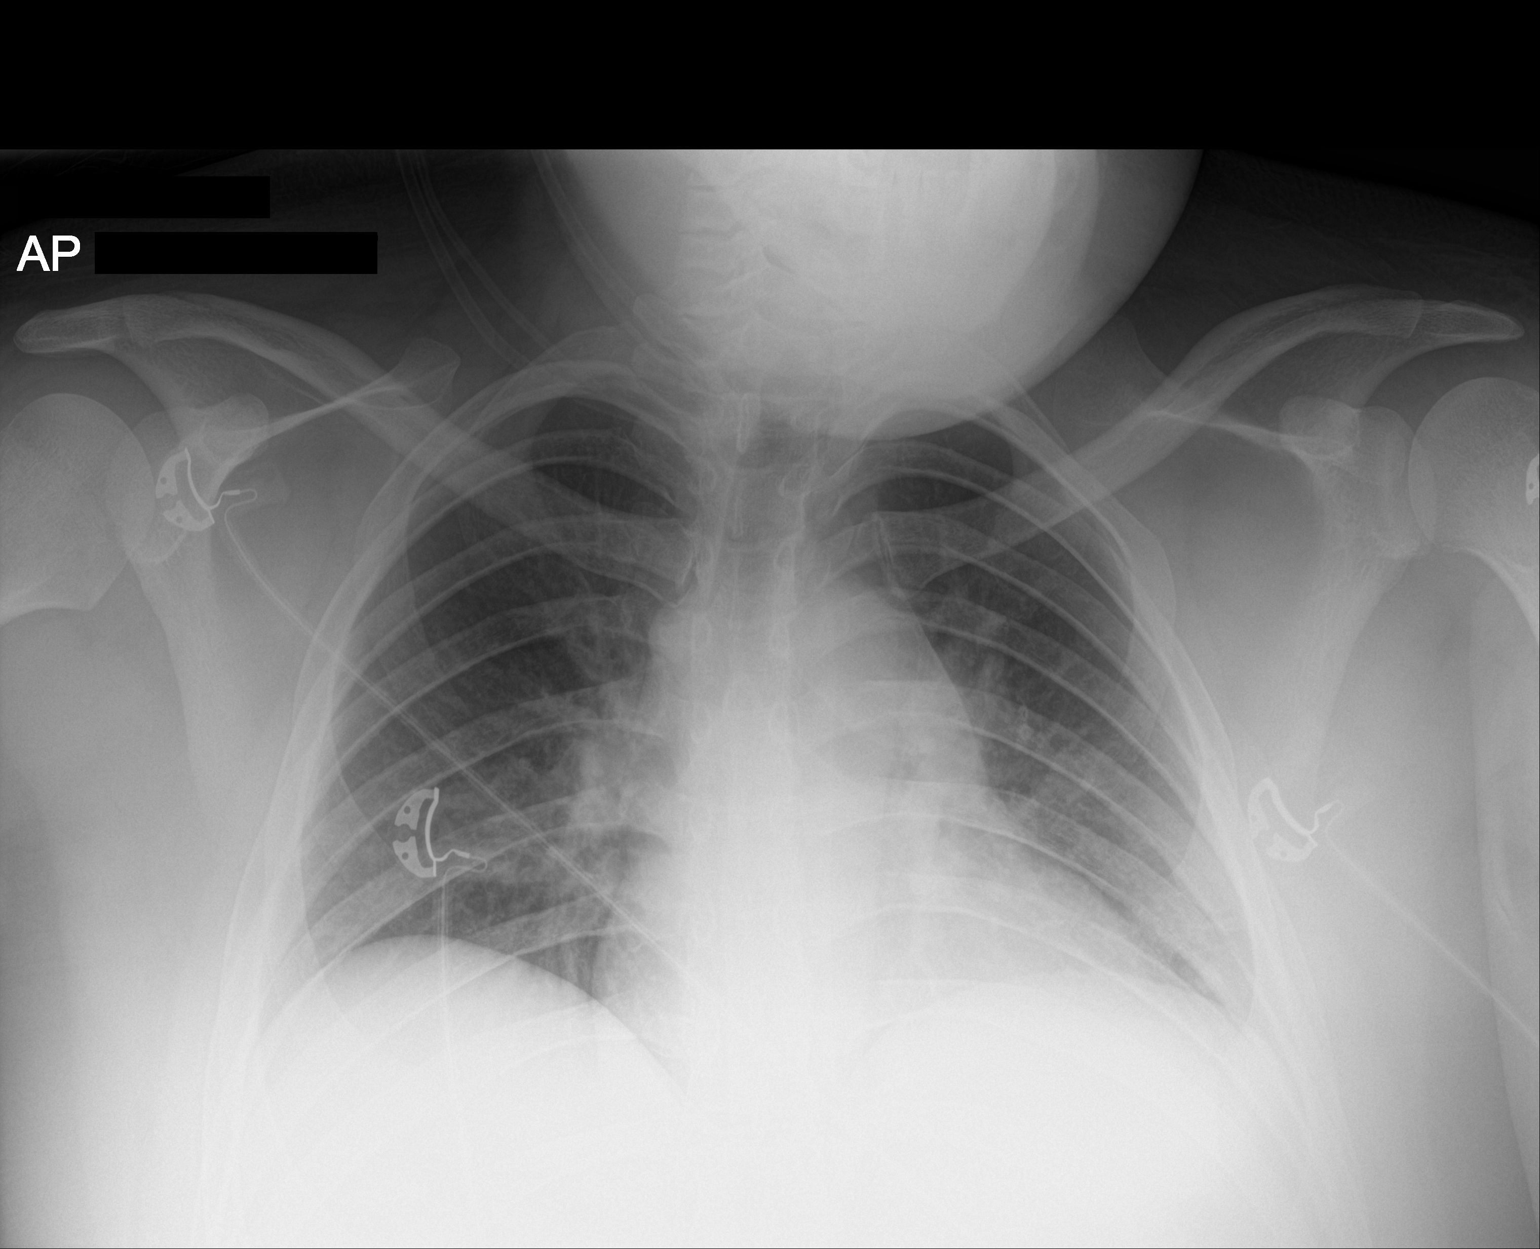

[1 of 1 positions shown; findings below may reference images not displayed]

FINDINGS: Heart, mediastinum hila are unremarkable. Clear lungs. No pleural
effusion or pneumothorax.

Skeletal structures are unremarkable.
IMPRESSION: No active disease.

## 2016-04-01 IMAGING — CT CT HEAD W/O CM
1 series · 16 of 30 positions shown, 20 images · non-contrast
Comparison: 04/30/2015 MRI

CLINICAL DATA: Patient comes from [REDACTED]. Patient has
been on suicide watch. They have been trying to wean him off Ativan.
When EMS arrived patient was unresponsive. Pupils were non reactive.
Seizures.

EXAM:
CT HEAD WITHOUT CONTRAST
TECHNIQUE: Contiguous axial images were obtained from the base of the skull
through the vertex without intravenous contrast.

[Series 2: head wo · axial · 0.47mm/px · z∈[-104,+31]mm · 16 of 30 slices shown, 20 images]
[im 2/30  brain]
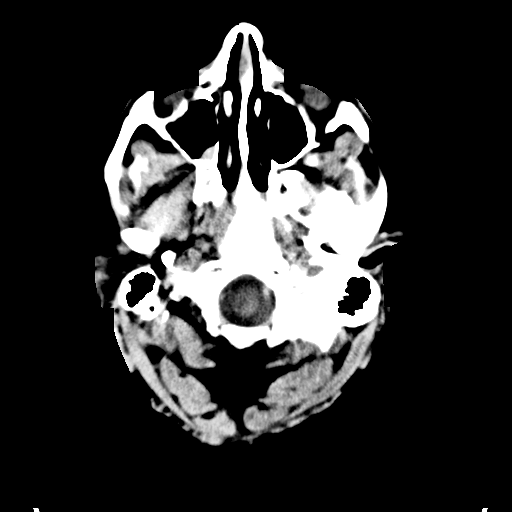
[im 2/30  bone]
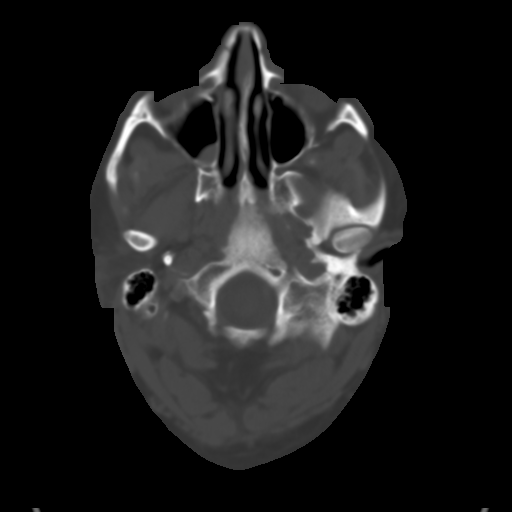
[im 4/30  brain]
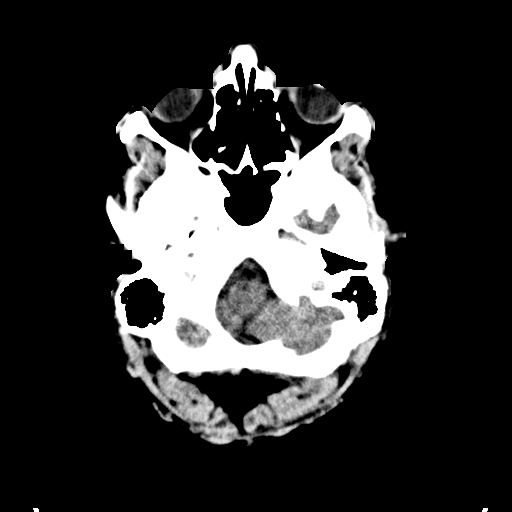
[im 6/30  brain]
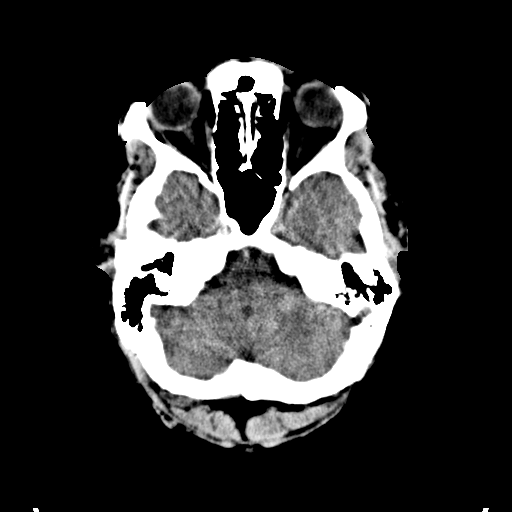
[im 8/30  brain]
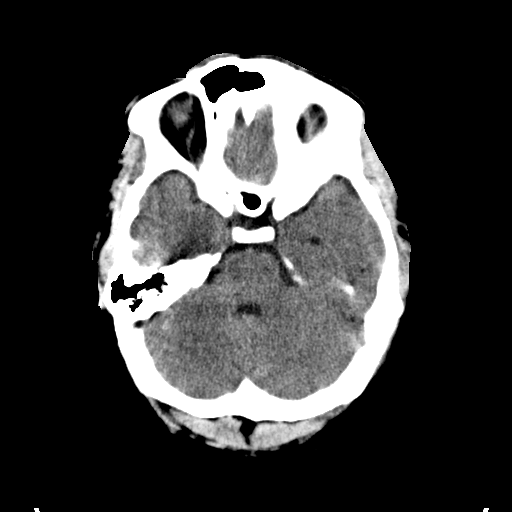
[im 9/30  brain]
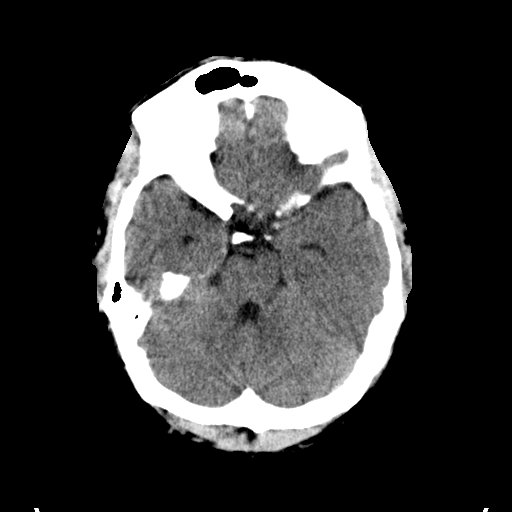
[im 9/30  bone]
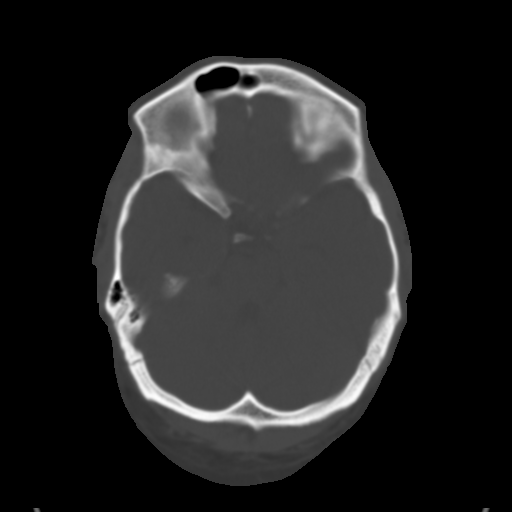
[im 11/30  brain]
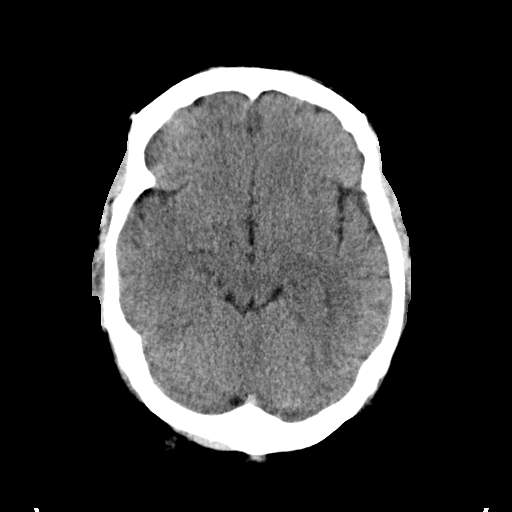
[im 13/30  brain]
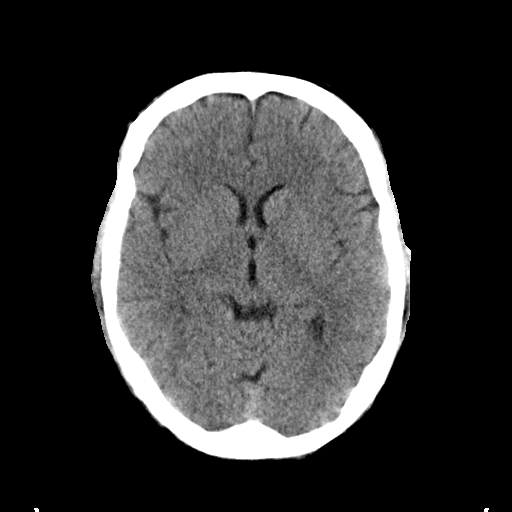
[im 15/30  brain]
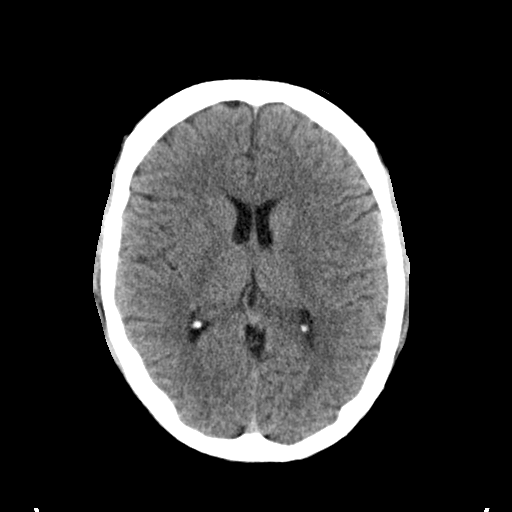
[im 16/30  brain]
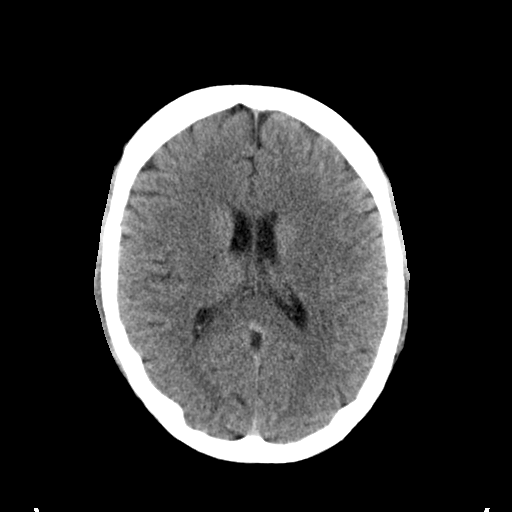
[im 16/30  bone]
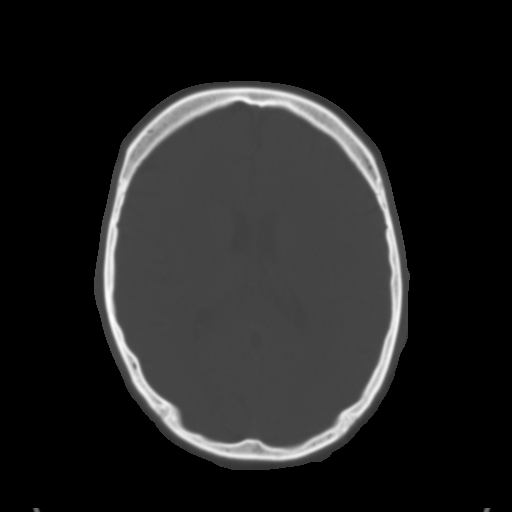
[im 18/30  brain]
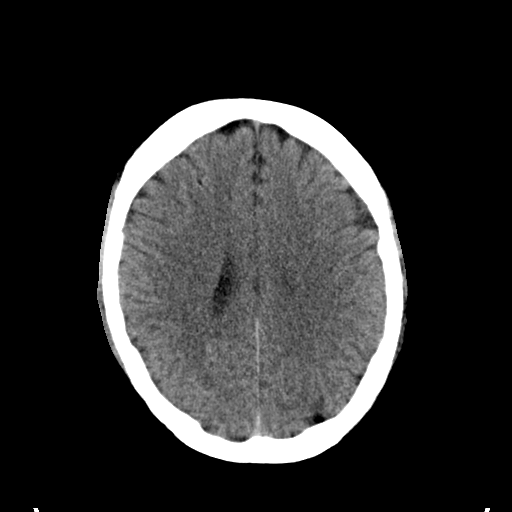
[im 20/30  brain]
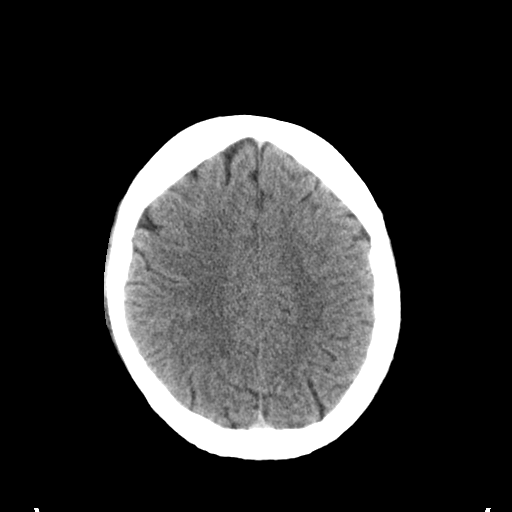
[im 22/30  brain]
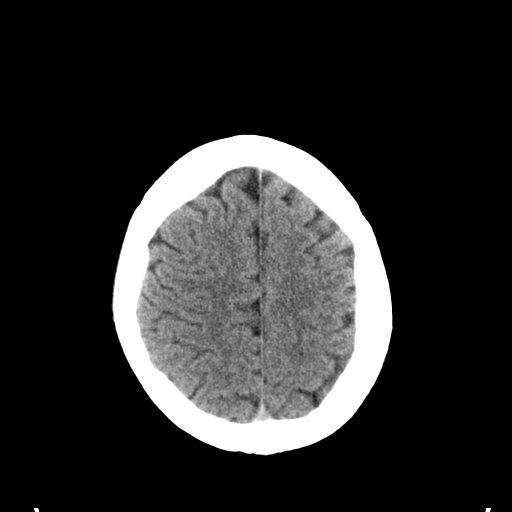
[im 23/30  brain]
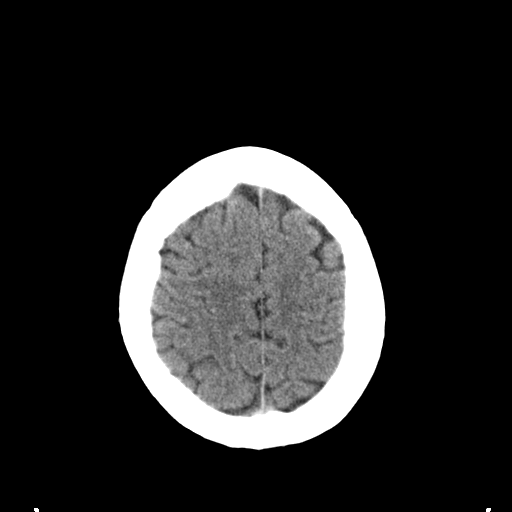
[im 23/30  bone]
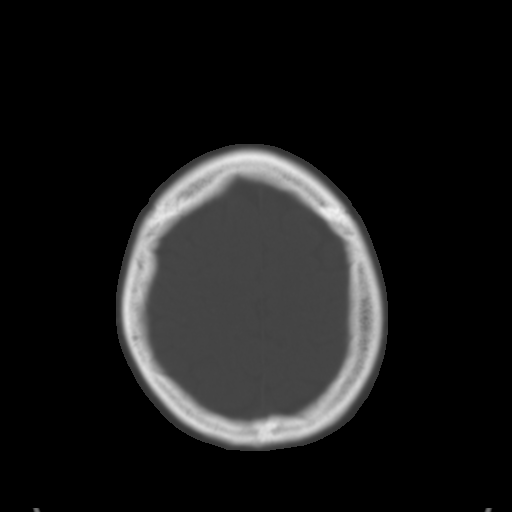
[im 25/30  brain]
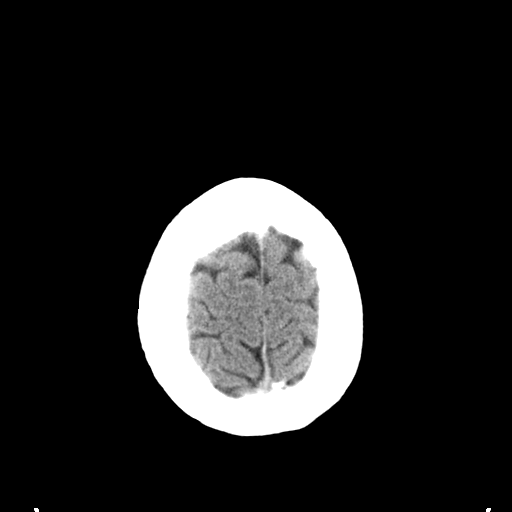
[im 27/30  brain]
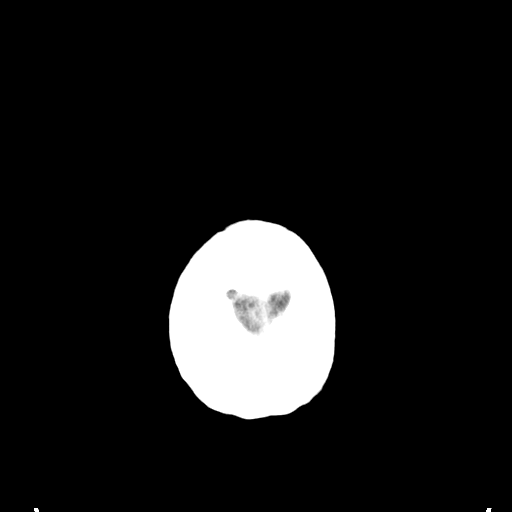
[im 29/30  brain]
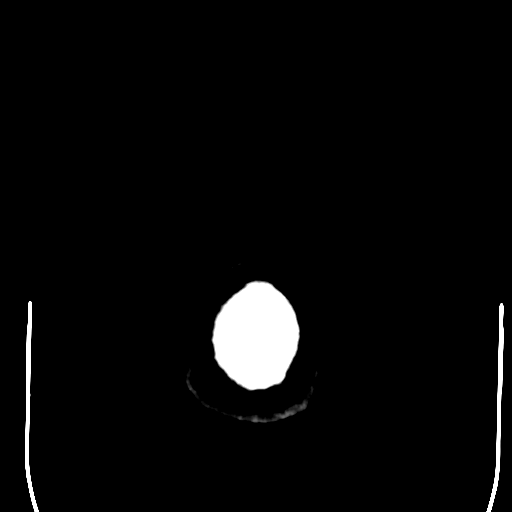

[16 of 30 positions shown; findings below may reference images not displayed]

FINDINGS: There is no intra or extra-axial fluid collection or mass lesion.
The basilar cisterns and ventricles have a normal appearance. There
is no CT evidence for acute infarction or hemorrhage. Bone windows
show mucoperiosteal thickening of the right maxillary sinus. No
acute calvarial abnormality.
IMPRESSION: No evidence for acute intracranial abnormality. Mild changes of
chronic sinusitis.

## 2016-04-02 IMAGING — CR DG CHEST 1V PORT
1 series · 1 of 1 positions shown · non-contrast
Comparison: 05/15/2015.

CLINICAL DATA: 24-year-old male with respiratory failure.
Subsequent encounter.

EXAM:
PORTABLE CHEST 1 VIEW

[AP]
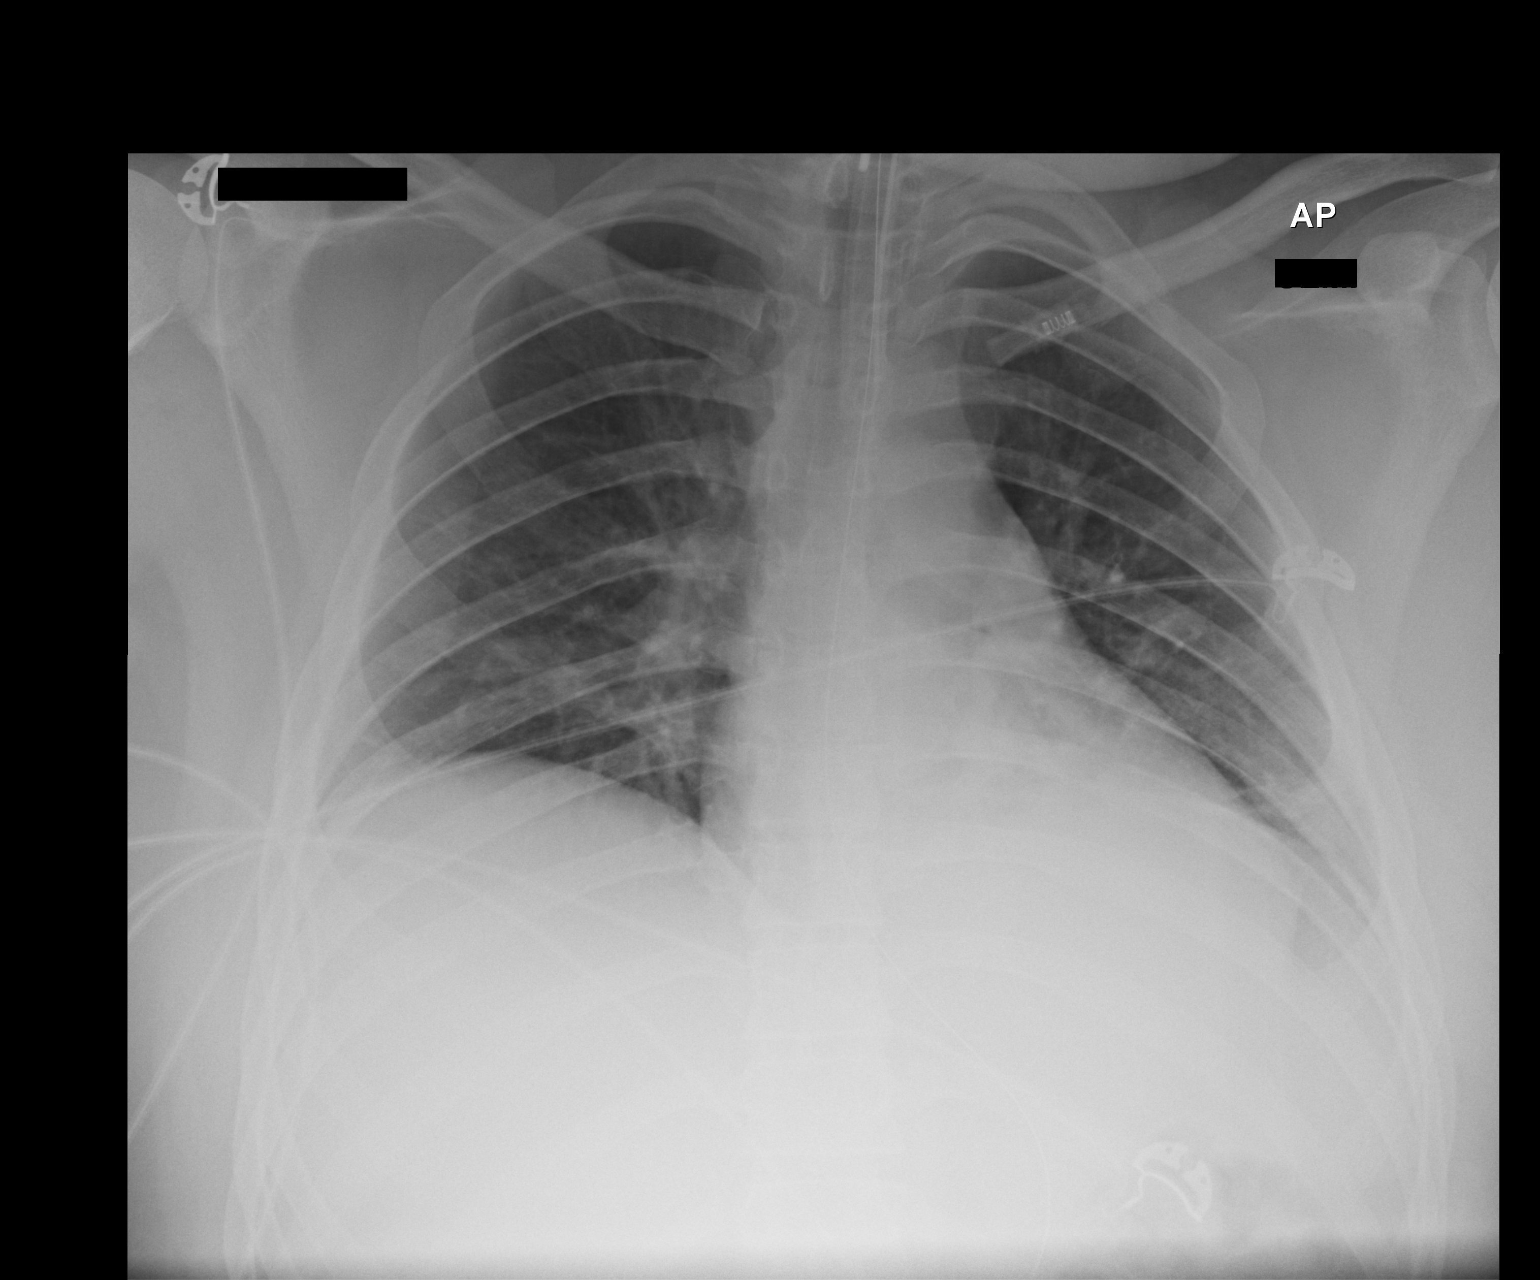

[1 of 1 positions shown; findings below may reference images not displayed]

FINDINGS: Endotracheal tube tip 3 cm above the carina.

Nasogastric tube courses below the diaphragm. Tip is not included on
the present exam.

Consolidation left base may represent atelectasis, infiltrate and/
or pleural effusion. Follow-up until clearance recommended to
exclude underlying abnormality.

Mild central pulmonary vascular prominence.

No gross pneumothorax.

Heart size top-normal.
IMPRESSION: Consolidation left base may represent atelectasis. Infiltrate and/or
pleural effusions secondary considerations.

## 2016-04-03 DIAGNOSIS — S069X9S Unspecified intracranial injury with loss of consciousness of unspecified duration, sequela: Secondary | ICD-10-CM | POA: Diagnosis not present

## 2016-04-03 DIAGNOSIS — F3489 Other specified persistent mood disorders: Secondary | ICD-10-CM | POA: Diagnosis not present

## 2016-04-03 DIAGNOSIS — F6381 Intermittent explosive disorder: Secondary | ICD-10-CM | POA: Diagnosis not present

## 2016-04-05 DIAGNOSIS — F3489 Other specified persistent mood disorders: Secondary | ICD-10-CM | POA: Diagnosis not present

## 2016-04-05 DIAGNOSIS — F6381 Intermittent explosive disorder: Secondary | ICD-10-CM | POA: Diagnosis not present

## 2016-04-05 DIAGNOSIS — F339 Major depressive disorder, recurrent, unspecified: Secondary | ICD-10-CM | POA: Diagnosis not present

## 2016-04-10 IMAGING — CR DG CHEST 1V PORT
1 series · 1 of 1 positions shown · non-contrast
Comparison: Multiple priors, most recently 05/23/2015.

CLINICAL DATA: 24-year-old male status post intubation.

EXAM:
PORTABLE CHEST 1 VIEW

[AP]
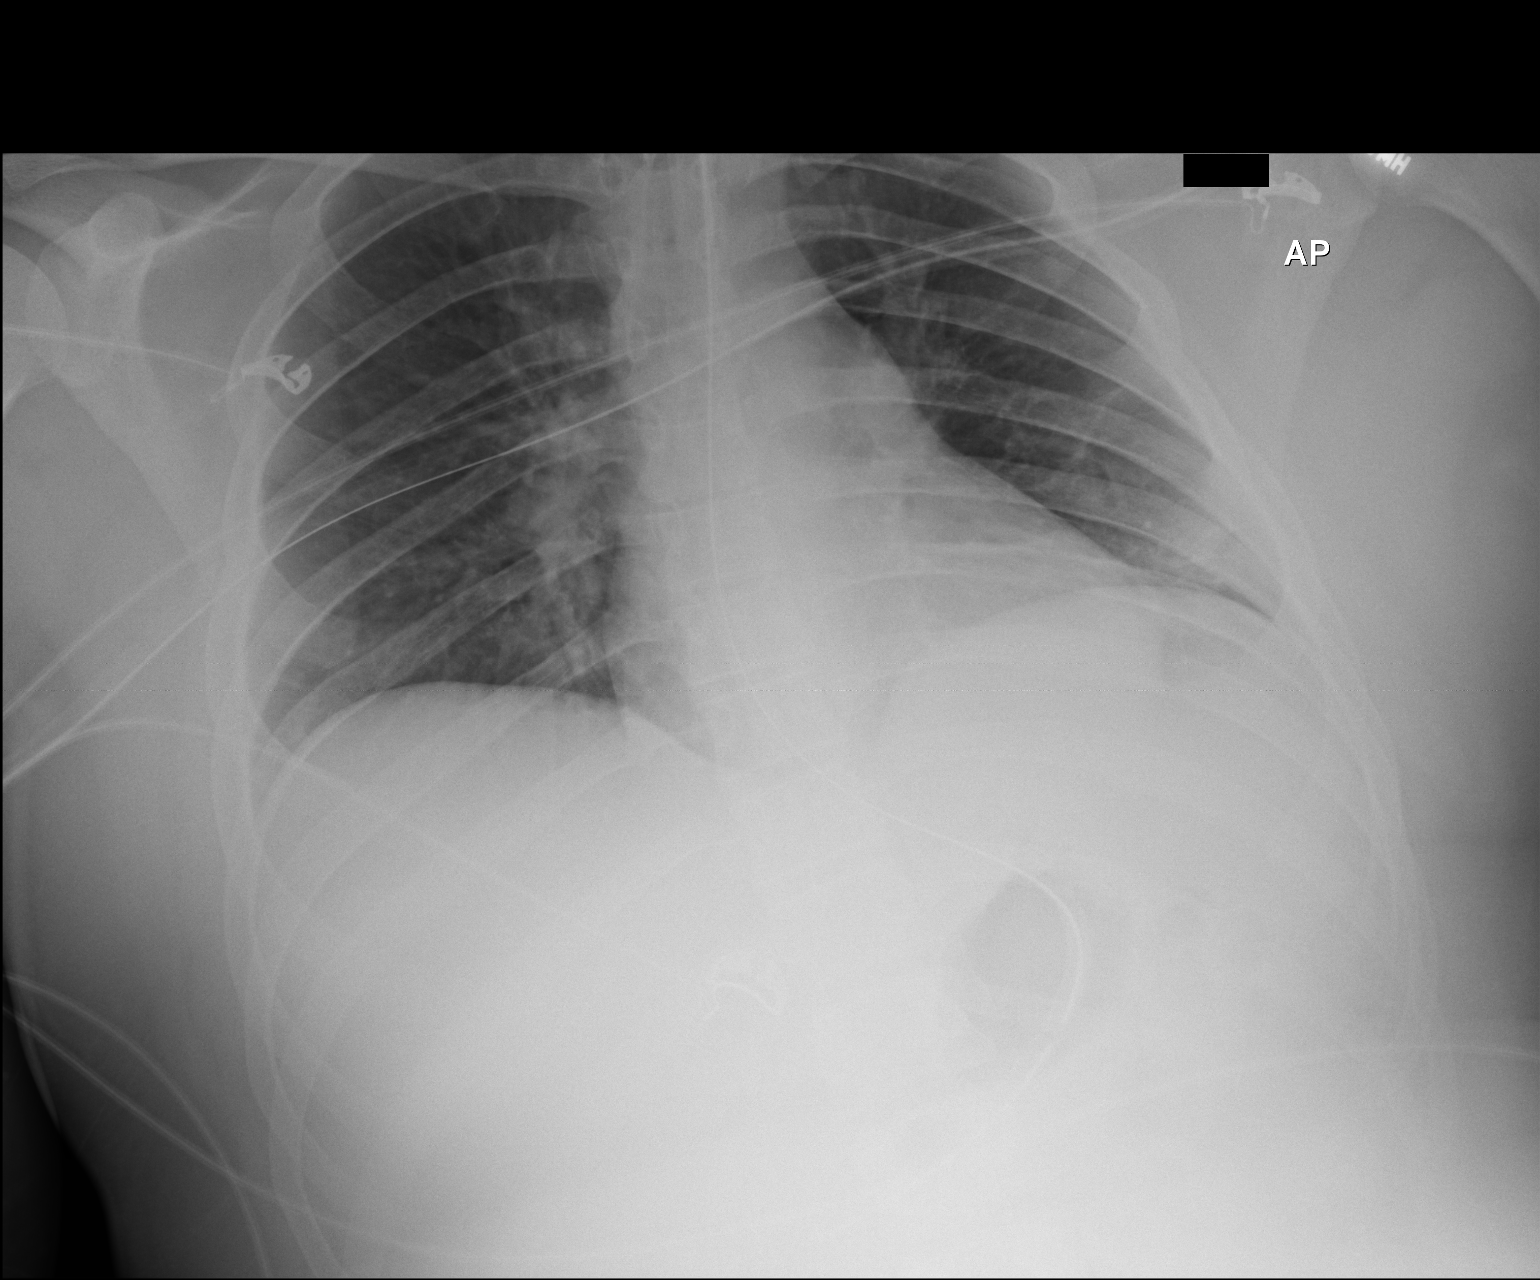

[1 of 1 positions shown; findings below may reference images not displayed]

FINDINGS: An endotracheal tube is in place with tip 3.7 cm above the carina.
Nasogastric tube extends into the antrum of the stomach. Lung
volumes are low. No consolidative airspace disease. No pleural
effusions. No pneumothorax. No pulmonary nodule or mass noted.
Pulmonary vasculature and the cardiomediastinal silhouette are
within normal limits.
IMPRESSION: 1. Support apparatus, as above.
2. Low lung volumes without radiographic evidence of acute
cardiopulmonary disease.

## 2016-04-19 DIAGNOSIS — F6381 Intermittent explosive disorder: Secondary | ICD-10-CM | POA: Diagnosis not present

## 2016-04-19 DIAGNOSIS — F339 Major depressive disorder, recurrent, unspecified: Secondary | ICD-10-CM | POA: Diagnosis not present

## 2016-04-26 DIAGNOSIS — F6381 Intermittent explosive disorder: Secondary | ICD-10-CM | POA: Diagnosis not present

## 2016-04-26 DIAGNOSIS — F339 Major depressive disorder, recurrent, unspecified: Secondary | ICD-10-CM | POA: Diagnosis not present

## 2016-05-15 DIAGNOSIS — F339 Major depressive disorder, recurrent, unspecified: Secondary | ICD-10-CM | POA: Diagnosis not present

## 2016-05-15 DIAGNOSIS — F6381 Intermittent explosive disorder: Secondary | ICD-10-CM | POA: Diagnosis not present

## 2016-05-17 DIAGNOSIS — F6381 Intermittent explosive disorder: Secondary | ICD-10-CM | POA: Diagnosis not present

## 2016-05-17 DIAGNOSIS — F411 Generalized anxiety disorder: Secondary | ICD-10-CM | POA: Diagnosis not present

## 2016-05-29 DIAGNOSIS — F6381 Intermittent explosive disorder: Secondary | ICD-10-CM | POA: Diagnosis not present

## 2016-05-29 DIAGNOSIS — F411 Generalized anxiety disorder: Secondary | ICD-10-CM | POA: Diagnosis not present

## 2016-05-29 DIAGNOSIS — F339 Major depressive disorder, recurrent, unspecified: Secondary | ICD-10-CM | POA: Diagnosis not present

## 2016-06-12 ENCOUNTER — Encounter: Payer: Self-pay | Admitting: Family Medicine

## 2016-06-12 ENCOUNTER — Ambulatory Visit (INDEPENDENT_AMBULATORY_CARE_PROVIDER_SITE_OTHER): Payer: BLUE CROSS/BLUE SHIELD | Admitting: Family Medicine

## 2016-06-12 VITALS — BP 125/86 | HR 91 | Temp 97.9°F | Resp 16 | Ht 69.0 in | Wt 269.0 lb

## 2016-06-12 DIAGNOSIS — Z0000 Encounter for general adult medical examination without abnormal findings: Secondary | ICD-10-CM | POA: Diagnosis not present

## 2016-06-12 DIAGNOSIS — Z8669 Personal history of other diseases of the nervous system and sense organs: Secondary | ICD-10-CM

## 2016-06-12 DIAGNOSIS — Z862 Personal history of diseases of the blood and blood-forming organs and certain disorders involving the immune mechanism: Secondary | ICD-10-CM | POA: Diagnosis not present

## 2016-06-12 DIAGNOSIS — R000 Tachycardia, unspecified: Secondary | ICD-10-CM | POA: Diagnosis not present

## 2016-06-12 DIAGNOSIS — F6381 Intermittent explosive disorder: Secondary | ICD-10-CM

## 2016-06-12 DIAGNOSIS — M6282 Rhabdomyolysis: Secondary | ICD-10-CM

## 2016-06-12 DIAGNOSIS — E7800 Pure hypercholesterolemia, unspecified: Secondary | ICD-10-CM | POA: Diagnosis not present

## 2016-06-12 DIAGNOSIS — F411 Generalized anxiety disorder: Secondary | ICD-10-CM | POA: Diagnosis not present

## 2016-06-12 DIAGNOSIS — E785 Hyperlipidemia, unspecified: Secondary | ICD-10-CM | POA: Insufficient documentation

## 2016-06-12 DIAGNOSIS — L989 Disorder of the skin and subcutaneous tissue, unspecified: Secondary | ICD-10-CM

## 2016-06-12 DIAGNOSIS — R7309 Other abnormal glucose: Secondary | ICD-10-CM | POA: Insufficient documentation

## 2016-06-12 DIAGNOSIS — F339 Major depressive disorder, recurrent, unspecified: Secondary | ICD-10-CM | POA: Diagnosis not present

## 2016-06-12 LAB — CBC WITH DIFFERENTIAL/PLATELET
Basophils Absolute: 0 cells/uL (ref 0–200)
Basophils Relative: 0 %
Eosinophils Absolute: 213 cells/uL (ref 15–500)
Eosinophils Relative: 3 %
HEMATOCRIT: 48.5 % (ref 38.5–50.0)
HEMOGLOBIN: 16.9 g/dL (ref 13.2–17.1)
LYMPHS ABS: 2485 {cells}/uL (ref 850–3900)
Lymphocytes Relative: 35 %
MCH: 29.3 pg (ref 27.0–33.0)
MCHC: 34.8 g/dL (ref 32.0–36.0)
MCV: 84.1 fL (ref 80.0–100.0)
MONO ABS: 710 {cells}/uL (ref 200–950)
MPV: 11.2 fL (ref 7.5–12.5)
Monocytes Relative: 10 %
Neutro Abs: 3692 cells/uL (ref 1500–7800)
Neutrophils Relative %: 52 %
Platelets: 197 10*3/uL (ref 140–400)
RBC: 5.77 MIL/uL (ref 4.20–5.80)
RDW: 13.7 % (ref 11.0–15.0)
WBC: 7.1 10*3/uL (ref 3.8–10.8)

## 2016-06-12 LAB — LIPID PANEL
CHOLESTEROL: 176 mg/dL (ref <200)
HDL: 43 mg/dL (ref >40)
LDL Cholesterol: 108 mg/dL — ABNORMAL HIGH (ref <100)
TRIGLYCERIDES: 124 mg/dL (ref <150)
Total CHOL/HDL Ratio: 4.1 Ratio (ref <5.0)
VLDL: 25 mg/dL (ref <30)

## 2016-06-12 LAB — COMPLETE METABOLIC PANEL WITH GFR
ALT: 55 U/L — AB (ref 9–46)
AST: 23 U/L (ref 10–40)
Albumin: 4.5 g/dL (ref 3.6–5.1)
Alkaline Phosphatase: 96 U/L (ref 40–115)
BILIRUBIN TOTAL: 0.5 mg/dL (ref 0.2–1.2)
BUN: 23 mg/dL (ref 7–25)
CALCIUM: 9.8 mg/dL (ref 8.6–10.3)
CO2: 27 mmol/L (ref 20–31)
CREATININE: 1.11 mg/dL (ref 0.60–1.35)
Chloride: 102 mmol/L (ref 98–110)
GFR, Est Non African American: >89 mL/min (ref >=60)
Glucose, Bld: 94 mg/dL (ref 65–99)
Potassium: 4.4 mmol/L (ref 3.5–5.3)
Sodium: 139 mmol/L (ref 135–146)
TOTAL PROTEIN: 7.5 g/dL (ref 6.1–8.1)

## 2016-06-12 NOTE — Assessment & Plan Note (Signed)
History of elevated glucose, last A1c 5.2 in 2016, prior to significant wt gain. - Family history of DM, HLD  Plan: 1. Encouraged healthy diet, exercise 2. Check chemistry today and A1c

## 2016-06-12 NOTE — Progress Notes (Addendum)
Subjective:    Patient ID: Chase Hampton, male    DOB: 10-Feb-1991, 26 y.o.   MRN: [id number]  Chase Hampton is a 26 y.o. male presenting on 06/12/2016 for Annual Exam  Establish care with new provider today. History provided by patient, also present is mother Legacy Lacivita.  HPI   Specialists: Neurology - Dr Lyda Perone [facility]) - next apt 07/2016 Psychiatry - Leafy Half, NP ([facility]) - q 3 months  History of Major Depression vs Mood Disorder / H/o Suicidal, Homicidal Ideation - Reviewed chart and discussed with patient about prior history, do not have full Psychiatry and Neurology records available at this time, but does have significant prior history of significant uncontrolled mental health problem related to major acute life stressor with relationship with former fiancee, resulting in mental breakdown. Ultimately, was arrested for psychological abuse, and developed additional medical issues, see below NMS. - Since acute mental and medical illness in 2017, patient has been stabilized and had extensive Neuro-Psychiatric evaluations, without any clear diagnosis of persistent mood disorder, not confirmed Major Depression or Bipolar. Not diagnosed with Anxiety syndrome. Primary mental health diagnosis was "Intermittent Explosive Disorder" - Prior extensive medication list including - Clonazepam, Bromocriptine, Benztropine, Amantadine among other SSRI and psychiatric medications, has remained off of these - Now only takes Propanolol for heart rate and some anxiety and Tegretol for mood stabilization  History of Neuroleptic Malignant Syndrome / Complex Acute ICU hospitalization 04/2015) - Significant past history of suspected NMS with acute respiratory failure, bacteremia, and possible seizures, hospitalized at [facility] ICU (intubated in Neuro ICU). Patient briefly reviewed this course, does not recall much from time being critically ill, chart  available and was reviewed. - Today patient overall reports he has done very well since acute illness, asking about history of elevated CK levels from muscle breakdown if this can harm his kidneys in future, and about prior abnormal LFTs - Additionally concerned about Tachycardia, see below  Tachycardia - Reports newer concern over past 12 months, seems to only have been onset problem after acute illness in hospital, listed above. Prior to this illness reports had HR 60s-70s, now seems to remain mostly 90s at rest, and will dramatically increase with exercise or stress. Previously when doing PT would have HR >120-150 at times - Admits occasional chest tightness, bilateral without associated dyspnea, diaphoresis or other symptoms - Asking about second opinion from Cardiology - Taking Propanolol  OBESITY BMI >39 - History of abnormal weight gain, previously wt was >280 lbs last year in 2017, he has started Ketogenic diet with improvement, and working on exercise with PT and home exercises. Now weight lifting 4 times a week. Overall doing better, wt down >20 lbs - Previously did Wrestling in school, able to gain/lose weight in short periods of time  Skin Lesion, Left Elbow - Reports concern with raised skin lesion, asking about possible wart, on left elbow. Wanted it checked out today. Denies any symptoms of pain, spreading redness, itching.  GERD Reports no new concerns. Stable acid reflux. Seems to be controlled on Protonix now only using PRN  Additional Social Hx: - Reportedly on house arrest currently, living with parents locally   Depression screen [facility] 2/9 06/12/2016 08/15/2015 03/02/2015  Decreased Interest 0 1 3  Down, Depressed, Hopeless 0 2 3  PHQ - 2 Score 0 3 6  Altered sleeping 1 2 3   Tired, decreased energy 1 2 3   Change in appetite 0 1  3  Feeling bad or failure about yourself  1 2 3   Trouble concentrating 0 0 3  Moving slowly or fidgety/restless 0 1 0  Suicidal thoughts 0 0  3  PHQ-9 Score 3 11 24   Difficult doing work/chores - Somewhat difficult Extremely dIfficult   GAD 7 : Generalized Anxiety Score 06/12/2016 03/02/2015  Nervous, Anxious, on Edge 2 3  Control/stop worrying 0 3  Worry too much - different things 1 3  Trouble relaxing 0 3  Restless 0 0  Easily annoyed or irritable 1 1  Afraid - awful might happen 1 3  Total GAD 7 Score 5 16  Anxiety Difficulty Not difficult at all Very difficult    Past Medical History:  Diagnosis Date  . Anxiety   . Depression   . GERD (gastroesophageal reflux disease)    Past Surgical History:  Procedure Laterality Date  . NO PAST SURGERIES     Social History   Social History  . Marital status: Single    Spouse name: N/A  . Number of children: N/A  . Years of education: N/A   Occupational History  . Not on file.   Social History Main Topics  . Smoking status: Former Smoker    Types: Cigars  . Smokeless tobacco: Never Used  . Alcohol use No  . Drug use: Yes    Types: Marijuana  . Sexual activity: No   Other Topics Concern  . Not on file   Social History Narrative  . No narrative on file   Family History  Problem Relation Age of Onset  . Heart disease Maternal Grandfather   . Breast cancer Paternal Grandmother   . Diabetes Father   . Hyperlipidemia Maternal Grandmother   . Diabetes Paternal Grandfather   . Heart disease Paternal Grandfather   . Hyperlipidemia Paternal Grandfather   . Colon cancer Neg Hx   . Prostate cancer Neg Hx    Current Outpatient Prescriptions on File Prior to Visit  Medication Sig  . pantoprazole (PROTONIX) 40 MG tablet TAKE 1 TABLET BY MOUTH DAILY (Patient taking differently: Take 1 tablet twice daily)   No current facility-administered medications on file prior to visit.     Review of Systems  Constitutional: Negative for activity change, appetite change, chills, diaphoresis, fatigue, fever and unexpected weight change.  HENT: Positive for postnasal drip  (improving). Negative for congestion, hearing loss and sinus pressure.   Eyes: Negative for visual disturbance.  Respiratory: Negative for cough, chest tightness, shortness of breath and wheezing.   Cardiovascular: Negative for chest pain, palpitations and leg swelling.  Gastrointestinal: Negative for abdominal pain, blood in stool, constipation, diarrhea, nausea and vomiting.       Heartburn occasionally  Endocrine: Negative for cold intolerance and polyuria.  Genitourinary: Negative for decreased urine volume, difficulty urinating, dysuria, frequency and hematuria.  Musculoskeletal: Negative for arthralgias, back pain and neck pain.  Skin: Negative for rash.       Left elbow skin lesion  Allergic/Immunologic: Positive for environmental allergies.  Neurological: Negative for dizziness, tremors, seizures, weakness, light-headedness, numbness and headaches.  Hematological: Negative for adenopathy.  Psychiatric/Behavioral: Negative for agitation, behavioral problems, decreased concentration, dysphoric mood, hallucinations, self-injury, sleep disturbance and suicidal ideas. The patient is not nervous/anxious and is not hyperactive.    Per HPI unless specifically indicated above     Objective:    BP 125/86   Pulse 91   Temp 97.9 F (36.6 C) (Oral)   Resp 16  Ht 5\' 9"  (1.753 m)   Wt 269 lb (122 kg)   BMI 39.72 kg/m   Wt Readings from Last 3 Encounters:  06/12/16 269 lb (122 kg)  08/15/15 274 lb (124.3 kg)  06/14/15 256 lb 6.3 oz (116.3 kg)    Physical Exam  Constitutional: He is oriented to person, place, and time. He appears well-developed and well-nourished. No distress.  Well-appearing, comfortable, cooperative, obese  HENT:  Head: Normocephalic and atraumatic.  Mouth/Throat: Oropharynx is clear and moist.  Frontal / maxillary sinuses non-tender. Nares patent without purulence or edema. Bilateral TMs clear without erythema, effusion or bulging. Oropharynx clear without  erythema, exudates, edema or asymmetry.  Eyes: Conjunctivae and EOM are normal. Pupils are equal, round, and reactive to light.  Neck: Normal range of motion. Neck supple. No thyromegaly present.  Cardiovascular: Normal rate, regular rhythm, normal heart sounds and intact distal pulses.   No murmur heard. No ectopy or irregular heartbeats  Pulmonary/Chest: Effort normal and breath sounds normal. No respiratory distress. He has no wheezes. He has no rales.  Abdominal: Soft. Bowel sounds are normal. He exhibits no distension and no mass. There is no tenderness.  Musculoskeletal: Normal range of motion. He exhibits no edema or tenderness.  Back normal without deformity or abnormal curvature.  Upper / Lower Extremities: - Normal muscle tone, strength bilateral upper extremities 5/5, lower extremities 5/5  - Normal Gait  Lymphadenopathy:    He has no cervical adenopathy.  Neurological: He is alert and oriented to person, place, and time. No cranial nerve deficit.  Distal sensation to light touch intact all extremities symmetrical  Skin: Skin is warm and dry. No rash noted. He is not diaphoretic.  Left elbow posterior skin lesion - See picture - < 0.5 cm small firm raised nodular abnormality with dry skin, localized slightly raised hyperpigmented slightly annular skin abnormality surrounding - No erythema, non tender no extension  Psychiatric: He has a normal mood and affect. His behavior is normal.  Well groomed, good eye contact, normal speech and thoughts  Nursing note and vitals reviewed.      Left Elbow posterior      Chemistry      Component Value Date/Time   NA 141 06/09/2015 0555   NA 137 06/20/2014 2242   K 4.0 06/09/2015 0555   K 4.0 06/20/2014 2242   CL 101 06/09/2015 0555   CL 101 06/20/2014 2242   CO2 31 06/09/2015 0555   CO2 24 06/20/2014 2242   BUN 10 06/09/2015 0555   BUN 18 06/20/2014 2242   CREATININE 0.78 06/09/2015 0555   CREATININE 1.15 06/20/2014 2242        Component Value Date/Time   CALCIUM 9.4 06/09/2015 0555   CALCIUM 9.4 06/20/2014 2242   ALKPHOS 55 06/09/2015 0555   AST 17 06/09/2015 0555   ALT 32 06/09/2015 0555   BILITOT 0.6 06/09/2015 0555     Lipid Panel     Component Value Date/Time   CHOL 167 02/24/2015 0656   TRIG 77 05/26/2015 0430   HDL 41 02/24/2015 0656   CHOLHDL 4.1 02/24/2015 0656   VLDL 16 02/24/2015 0656   LDLCALC 110 (H) 02/24/2015 0656       Assessment & Plan:   Problem List Items Addressed This Visit    Tachycardia    Stable today, seems to be chronic problem with some variability with HR, higher at baseline and will have acute tachycardia >100 if activity / stress, or even when  at rest by report. - Chart review without significant remarkable Tachycardia - Has intermittent symptoms not always exertional with chest tightness, without other associated typical features  Plan: 1. Discussion today on HR, reviewed prior work-up EKG and hospitalization, likely acute illness related at that time, currently without significant problem on recent record. Given significant illness at that time, with probable NMS, currently on BB propanolol with breakthrough symptoms, consider further evaluation reasonable to have second opinion from Cardiology discuss future work-up or options 2. Check labs today with chemistry, TSH, CBC for initial work-up, agreed to defer EKG without symptoms today 3. Patient to notify office of Cardiology preference for referral      Relevant Orders   COMPLETE METABOLIC PANEL WITH GFR (Completed)   TSH (Completed)   T4, free (Completed)   Skin lesion of left arm    Asymptomatic, small area dorsal left elbow, see picture, with raised possible wart-like nodule with dry skin otherwise no abnormality, and some slightly pigmented annular skin lesion. - Reassurance today, not entirely consistent with ringworm / tinea vs wart, will defer management at this time and recommend observation - Future  can consider topical treatment for possible tinea if changing, vs punch biopsy, cryotherapy, or referral to Derm for shave biopsy      RESOLVED: Non-traumatic rhabdomyolysis   Intermittent explosive disorder    Stable currently. Followed by Psychiatry. Prior significant mental health dx MDD, suicidal/homicidal, questionable bipolar, anxiety, however by report psychiatric evaluations have not confirmed these diagnoses. Today PHQ/GAD screenings negative without concerns. Clinically seems to be mentally stable and improved at this time, possibly acute mental health breakdown due to acute life stressor  Plan: 1. Continue current management and follow-up from Psychiatry, advised patient will adjust diagnoses and update his chart to reflect dramatic improvement, and requesting records from Psychiatry to confirm these updates. 2. Continue currently on propanolol, Tegretol 3. Follow-up as needed      Hyperlipidemia    Prior elevated LDL. Abnormal wt gain now improving wt diet/exercise Check fasting lipids and chemistry today      Relevant Medications   propranolol (INDERAL) 20 MG tablet   Other Relevant Orders   Lipid panel (Completed)   History of neuroleptic malignant syndrome    Stable, without any recent flares or symptoms. Limited chronic complications, seems to have made dramatic recovery. Question if some tachycardia may be related. Significant acute illness with probable NMS 04/2015 hospitalized at [facility] Neuro ICU Followed by Neurology [facility]) currently On Propanolol Follow-up as needed      Abnormal glucose    History of elevated glucose, last A1c 5.2 in 2016, prior to significant wt gain. - Family history of DM, HLD  Plan: 1. Encouraged healthy diet, exercise 2. Check chemistry today and A1c      Relevant Orders   Hemoglobin A1c (Completed)    Other Visit Diagnoses    Annual physical exam    -  Primary   Relevant Orders   COMPLETE METABOLIC PANEL WITH GFR (Completed)    Lipid panel (Completed)   Hemoglobin A1c (Completed)   CBC with Differential/Platelet (Completed)   History of anemia       Relevant Orders   CBC with Differential/Platelet (Completed)      Meds ordered this encounter  Medications  . carbamazepine (TEGRETOL XR) 100 MG 12 hr tablet    Sig: Take 100 mg by mouth 2 (two) times daily.     Refill:  0  . propranolol (INDERAL) 20 MG tablet  Refill:  0      Follow up plan: Return in about 6 months (around 12/13/2016) for  Weight / Tachycardia.  Saralyn Pilar, DO [facility] 06/12/2016, 10:36 PM

## 2016-06-12 NOTE — Assessment & Plan Note (Addendum)
Stable, without any recent flares or symptoms. Limited chronic complications, seems to have made dramatic recovery. Question if some tachycardia may be related. Significant acute illness with probable NMS 04/2015 hospitalized at [facility] Neuro ICU Followed by Neurology [facility] currently On Propanolol Follow-up as needed

## 2016-06-12 NOTE — Patient Instructions (Signed)
Thank you for coming in to clinic today.  1. Keep up the good work  2. Please call us to notify us if you are able to locate the Cardiologist you prefer to see. Frequently we [facility] [place] [facility] or [facility] - For referral for Tachycardia - Given your history I think this is a good idea to have re-evaluated - We will check the blood work and thyroid function today - Additionally this may be due to your diet changes, lifestyle, resuming more active exercise, and weight  3. For the skin lesion on left elbow, I don't know exactly what it is, possibly wart or has some characteristics of thick skin kind of like an eczema over elbow - No definitive treatment - if you want to try some OTC hydrocortisone as needed that is fine - In future we could consider cryotherapy vs biopsy, or referral to Dermatology, let me know if significant changes  Please have your Neurology / Psychiatry offices send us copy of most recent office visits / results  Please schedule a follow-up appointment with Dr. Althea CharonKaramalegos in 6 months for follow-up Weight / Tachycardia  If you have any other questions or concerns, please feel free to call the clinic or send a message through MyChart. You may also schedule an earlier appointment if necessary.  Saralyn PilarAlexander Doni Widmer, DO [facility]

## 2016-06-12 NOTE — Assessment & Plan Note (Signed)
Prior elevated LDL. Abnormal wt gain now improving wt diet/exercise Check fasting lipids and chemistry today

## 2016-06-12 NOTE — Assessment & Plan Note (Signed)
Stable today, seems to be chronic problem with some variability with HR, higher at baseline and will have acute tachycardia >100 if activity / stress, or even when at rest by report. - Chart review without significant remarkable Tachycardia - Has intermittent symptoms not always exertional with chest tightness, without other associated typical features  Plan: 1. Discussion today on HR, reviewed prior work-up EKG and hospitalization, likely acute illness related at that time, currently without significant problem on recent record. Given significant illness at that time, with probable NMS, currently on BB propanolol with breakthrough symptoms, consider further evaluation reasonable to have second opinion from Cardiology discuss future work-up or options 2. Check labs today with chemistry, TSH, CBC for initial work-up, agreed to defer EKG without symptoms today 3. Patient to notify office of Cardiology preference for referral

## 2016-06-12 NOTE — Assessment & Plan Note (Addendum)
Stable currently. Followed by Psychiatry. Prior significant mental health dx MDD, suicidal/homicidal, questionable bipolar, anxiety, however by report psychiatric evaluations have not confirmed these diagnoses. Today PHQ/GAD screenings negative without concerns. Clinically seems to be mentally stable and improved at this time, possibly acute mental health breakdown due to acute life stressor  Plan: 1. Continue current management and follow-up from Psychiatry, advised patient will adjust diagnoses and update his chart to reflect dramatic improvement, and requesting records from Psychiatry to confirm these updates. 2. Continue currently on propanolol, Tegretol 3. Follow-up as needed

## 2016-06-12 NOTE — Assessment & Plan Note (Signed)
Asymptomatic, small area dorsal left elbow, see picture, with raised possible wart-like nodule with dry skin otherwise no abnormality, and some slightly pigmented annular skin lesion. - Reassurance today, not entirely consistent with ringworm / tinea vs wart, will defer management at this time and recommend observation - Future can consider topical treatment for possible tinea if changing, vs punch biopsy, cryotherapy, or referral to Derm for shave biopsy

## 2016-06-13 LAB — T4, FREE: FREE T4: 1.1 ng/dL (ref 0.8–1.8)

## 2016-06-13 LAB — HEMOGLOBIN A1C
Hgb A1c MFr Bld: 5 % (ref <5.7)
Mean Plasma Glucose: 97 mg/dL

## 2016-06-13 LAB — TSH: TSH: 2.79 mIU/L (ref 0.40–4.50)

## 2016-06-26 DIAGNOSIS — F411 Generalized anxiety disorder: Secondary | ICD-10-CM | POA: Diagnosis not present

## 2016-06-26 DIAGNOSIS — F6381 Intermittent explosive disorder: Secondary | ICD-10-CM | POA: Diagnosis not present

## 2016-06-26 DIAGNOSIS — F339 Major depressive disorder, recurrent, unspecified: Secondary | ICD-10-CM | POA: Diagnosis not present

## 2016-07-12 DIAGNOSIS — F339 Major depressive disorder, recurrent, unspecified: Secondary | ICD-10-CM | POA: Diagnosis not present

## 2016-07-12 DIAGNOSIS — F411 Generalized anxiety disorder: Secondary | ICD-10-CM | POA: Diagnosis not present

## 2016-07-12 DIAGNOSIS — F6381 Intermittent explosive disorder: Secondary | ICD-10-CM | POA: Diagnosis not present

## 2016-08-02 DIAGNOSIS — G210 Malignant neuroleptic syndrome: Secondary | ICD-10-CM | POA: Diagnosis not present

## 2016-08-02 DIAGNOSIS — F411 Generalized anxiety disorder: Secondary | ICD-10-CM | POA: Diagnosis not present

## 2016-08-02 DIAGNOSIS — F339 Major depressive disorder, recurrent, unspecified: Secondary | ICD-10-CM | POA: Diagnosis not present

## 2016-08-02 DIAGNOSIS — F6381 Intermittent explosive disorder: Secondary | ICD-10-CM | POA: Diagnosis not present

## 2016-08-02 DIAGNOSIS — G4700 Insomnia, unspecified: Secondary | ICD-10-CM | POA: Diagnosis not present

## 2016-08-02 DIAGNOSIS — G4733 Obstructive sleep apnea (adult) (pediatric): Secondary | ICD-10-CM | POA: Diagnosis not present

## 2016-08-09 DIAGNOSIS — F339 Major depressive disorder, recurrent, unspecified: Secondary | ICD-10-CM | POA: Diagnosis not present

## 2016-08-09 DIAGNOSIS — F411 Generalized anxiety disorder: Secondary | ICD-10-CM | POA: Diagnosis not present

## 2016-08-09 DIAGNOSIS — F6381 Intermittent explosive disorder: Secondary | ICD-10-CM | POA: Diagnosis not present

## 2016-08-14 DIAGNOSIS — F411 Generalized anxiety disorder: Secondary | ICD-10-CM | POA: Diagnosis not present

## 2016-08-14 DIAGNOSIS — F6381 Intermittent explosive disorder: Secondary | ICD-10-CM | POA: Diagnosis not present

## 2016-08-14 DIAGNOSIS — F339 Major depressive disorder, recurrent, unspecified: Secondary | ICD-10-CM | POA: Diagnosis not present

## 2016-08-28 DIAGNOSIS — F411 Generalized anxiety disorder: Secondary | ICD-10-CM | POA: Diagnosis not present

## 2016-08-28 DIAGNOSIS — F339 Major depressive disorder, recurrent, unspecified: Secondary | ICD-10-CM | POA: Diagnosis not present

## 2016-08-28 DIAGNOSIS — F6381 Intermittent explosive disorder: Secondary | ICD-10-CM | POA: Diagnosis not present

## 2016-09-19 DIAGNOSIS — F411 Generalized anxiety disorder: Secondary | ICD-10-CM | POA: Diagnosis not present

## 2016-09-19 DIAGNOSIS — F6381 Intermittent explosive disorder: Secondary | ICD-10-CM | POA: Diagnosis not present

## 2016-09-19 DIAGNOSIS — F339 Major depressive disorder, recurrent, unspecified: Secondary | ICD-10-CM | POA: Diagnosis not present

## 2016-10-09 DIAGNOSIS — F411 Generalized anxiety disorder: Secondary | ICD-10-CM | POA: Diagnosis not present

## 2016-10-09 DIAGNOSIS — F339 Major depressive disorder, recurrent, unspecified: Secondary | ICD-10-CM | POA: Diagnosis not present

## 2016-10-09 DIAGNOSIS — F6381 Intermittent explosive disorder: Secondary | ICD-10-CM | POA: Diagnosis not present

## 2016-10-22 DIAGNOSIS — F6381 Intermittent explosive disorder: Secondary | ICD-10-CM | POA: Diagnosis not present

## 2016-10-22 DIAGNOSIS — F3341 Major depressive disorder, recurrent, in partial remission: Secondary | ICD-10-CM | POA: Diagnosis not present

## 2016-10-22 DIAGNOSIS — F411 Generalized anxiety disorder: Secondary | ICD-10-CM | POA: Diagnosis not present

## 2016-11-07 DIAGNOSIS — F411 Generalized anxiety disorder: Secondary | ICD-10-CM | POA: Diagnosis not present

## 2016-11-07 DIAGNOSIS — F6381 Intermittent explosive disorder: Secondary | ICD-10-CM | POA: Diagnosis not present

## 2016-11-07 DIAGNOSIS — F3341 Major depressive disorder, recurrent, in partial remission: Secondary | ICD-10-CM | POA: Diagnosis not present

## 2016-11-21 DIAGNOSIS — F411 Generalized anxiety disorder: Secondary | ICD-10-CM | POA: Diagnosis not present

## 2016-11-21 DIAGNOSIS — F3341 Major depressive disorder, recurrent, in partial remission: Secondary | ICD-10-CM | POA: Diagnosis not present

## 2016-11-21 DIAGNOSIS — F6381 Intermittent explosive disorder: Secondary | ICD-10-CM | POA: Diagnosis not present

## 2016-12-18 ENCOUNTER — Ambulatory Visit: Payer: Self-pay | Admitting: Family Medicine

## 2016-12-18 DIAGNOSIS — F3341 Major depressive disorder, recurrent, in partial remission: Secondary | ICD-10-CM | POA: Diagnosis not present

## 2016-12-18 DIAGNOSIS — F6381 Intermittent explosive disorder: Secondary | ICD-10-CM | POA: Diagnosis not present

## 2016-12-18 DIAGNOSIS — F411 Generalized anxiety disorder: Secondary | ICD-10-CM | POA: Diagnosis not present

## 2016-12-20 DIAGNOSIS — F339 Major depressive disorder, recurrent, unspecified: Secondary | ICD-10-CM | POA: Diagnosis not present

## 2016-12-20 DIAGNOSIS — F411 Generalized anxiety disorder: Secondary | ICD-10-CM | POA: Diagnosis not present

## 2016-12-20 DIAGNOSIS — F6381 Intermittent explosive disorder: Secondary | ICD-10-CM | POA: Diagnosis not present

## 2017-01-08 DIAGNOSIS — F6381 Intermittent explosive disorder: Secondary | ICD-10-CM | POA: Diagnosis not present

## 2017-01-08 DIAGNOSIS — F339 Major depressive disorder, recurrent, unspecified: Secondary | ICD-10-CM | POA: Diagnosis not present

## 2017-01-08 DIAGNOSIS — F411 Generalized anxiety disorder: Secondary | ICD-10-CM | POA: Diagnosis not present

## 2017-01-30 DIAGNOSIS — F339 Major depressive disorder, recurrent, unspecified: Secondary | ICD-10-CM | POA: Diagnosis not present

## 2017-01-30 DIAGNOSIS — F411 Generalized anxiety disorder: Secondary | ICD-10-CM | POA: Diagnosis not present

## 2017-04-01 DIAGNOSIS — F339 Major depressive disorder, recurrent, unspecified: Secondary | ICD-10-CM | POA: Diagnosis not present

## 2017-04-01 DIAGNOSIS — F411 Generalized anxiety disorder: Secondary | ICD-10-CM | POA: Diagnosis not present

## 2017-04-22 DIAGNOSIS — F339 Major depressive disorder, recurrent, unspecified: Secondary | ICD-10-CM | POA: Diagnosis not present

## 2017-04-22 DIAGNOSIS — F411 Generalized anxiety disorder: Secondary | ICD-10-CM | POA: Diagnosis not present

## 2017-05-13 DIAGNOSIS — B078 Other viral warts: Secondary | ICD-10-CM | POA: Diagnosis not present

## 2017-05-15 DIAGNOSIS — F339 Major depressive disorder, recurrent, unspecified: Secondary | ICD-10-CM | POA: Diagnosis not present

## 2017-05-15 DIAGNOSIS — F411 Generalized anxiety disorder: Secondary | ICD-10-CM | POA: Diagnosis not present

## 2017-06-03 DIAGNOSIS — B078 Other viral warts: Secondary | ICD-10-CM | POA: Diagnosis not present

## 2017-06-05 DIAGNOSIS — F339 Major depressive disorder, recurrent, unspecified: Secondary | ICD-10-CM | POA: Diagnosis not present

## 2017-06-05 DIAGNOSIS — F411 Generalized anxiety disorder: Secondary | ICD-10-CM | POA: Diagnosis not present

## 2017-07-13 ENCOUNTER — Ambulatory Visit
Admission: EM | Admit: 2017-07-13 | Discharge: 2017-07-13 | Disposition: A | Discharge status: Home / Self Care | Payer: BLUE CROSS/BLUE SHIELD | Attending: Family Medicine | Admitting: Family Medicine

## 2017-07-13 DIAGNOSIS — B9789 Other viral agents as the cause of diseases classified elsewhere: Secondary | ICD-10-CM | POA: Diagnosis not present

## 2017-07-13 DIAGNOSIS — J069 Acute upper respiratory infection, unspecified: Secondary | ICD-10-CM

## 2017-07-13 DIAGNOSIS — R5383 Other fatigue: Secondary | ICD-10-CM | POA: Diagnosis not present

## 2017-07-13 DIAGNOSIS — J028 Acute pharyngitis due to other specified organisms: Secondary | ICD-10-CM

## 2017-07-13 DIAGNOSIS — J029 Acute pharyngitis, unspecified: Secondary | ICD-10-CM

## 2017-07-13 LAB — RAPID STREP SCREEN (MED CTR MEBANE ONLY): STREPTOCOCCUS, GROUP A SCREEN (DIRECT): NEGATIVE

## 2017-07-13 MED ORDER — HYDROCOD POLST-CPM POLST ER 10-8 MG/5ML PO SUER: 5.0000 mL | Freq: Two times a day (BID) | ORAL | 0 refills | Status: DC

## 2017-07-13 MED ORDER — BENZONATATE 200 MG PO CAPS: ORAL_CAPSULE | ORAL | 0 refills | Status: DC

## 2017-07-13 NOTE — ED Provider Notes (Signed)
[facility]    CSN: [id number] Arrival date & time: 07/13/17  0904     History   Chief Complaint Chief Complaint  Patient presents with  . Fever    HPI Chase Hampton is a 27 y.o. male.   HPI 27 year old male presents with fever and fatigue that he has had since Thursday 2 days prior to this visit.  C/O sore throat malaise as well as cough.  This is much worse when he lies down and keeps him awake at night.  He took Tylenol or ibuprofen this morning and is afebrile right now.  Fevers have been 100.4 range.  He states that he has been around his niece and nephew but neither of them have been sick to his knowledge.        Past Medical History:  Diagnosis Date  . Anxiety   . Depression   . GERD (gastroesophageal reflux disease)     Patient Active Problem List   Diagnosis Date Noted  . Tachycardia 06/12/2016  . Skin lesion of left arm 06/12/2016  . Abnormal glucose 06/12/2016  . Hyperlipidemia 06/12/2016  . Eczema 08/15/2015  . History of neuroleptic malignant syndrome   . Intermittent explosive disorder     Past Surgical History:  Procedure Laterality Date  . NO PAST SURGERIES         Home Medications    Prior to Admission medications   Medication Sig Start Date End Date Taking? Authorizing Provider  benzonatate (TESSALON) 200 MG capsule Take one cap TID PRN cough 07/13/17   Lutricia Feiloemer, William P, PA-C  carbamazepine (TEGRETOL XR) 100 MG 12 hr tablet Take 100 mg by mouth 2 (two) times daily.  05/10/16   [provider]  chlorpheniramine-HYDROcodone (TUSSIONEX PENNKINETIC ER) 10-8 MG/5ML SUER Take 5 mLs by mouth 2 (two) times daily. 07/13/17   Lutricia Feiloemer, William P, PA-C  propranolol (INDERAL) 20 MG tablet  05/19/16   [provider]    Family History Family History  Problem Relation Age of Onset  . Heart disease Maternal Grandfather   . Breast cancer Paternal Grandmother   . Diabetes Father   . Hyperlipidemia Maternal Grandmother    . Diabetes Paternal Grandfather   . Heart disease Paternal Grandfather   . Hyperlipidemia Paternal Grandfather   . Colon cancer Neg Hx   . Prostate cancer Neg Hx     Social History Social History   Tobacco Use  . Smoking status: Former Smoker    Types: Cigars  . Smokeless tobacco: Never Used  Substance Use Topics  . Alcohol use: No  . Drug use: Yes    Types: Marijuana     Allergies   Haldol [haloperidol lactate]   Review of Systems Review of Systems  Constitutional: Positive for activity change, chills, fatigue and fever.  HENT: Positive for congestion and sore throat.   Respiratory: Positive for cough.   All other systems reviewed and are negative.    Physical Exam Triage Vital Signs ED Triage Vitals  Enc Vitals Group     BP 07/13/17 0913 124/83     Pulse Rate 07/13/17 0913 94     Resp 07/13/17 0913 18     Temp 07/13/17 0913 97.7 F (36.5 C)     Temp Source 07/13/17 0913 Oral     SpO2 07/13/17 0913 98 %     Weight 07/13/17 0915 280 lb (127 kg)     Height --      Head Circumference --  Peak Flow --      Pain Score 07/13/17 0915 3     Pain Loc --      Pain Edu? --      Excl. in GC? --    No data found.  Updated Vital Signs BP 124/83 (BP Location: Left Arm)   Pulse 94   Temp 97.7 F (36.5 C) (Oral)   Resp 18   Wt 280 lb (127 kg)   SpO2 98%   BMI 41.35 kg/m   Visual Acuity Right Eye Distance:   Left Eye Distance:   Bilateral Distance:    Right Eye Near:   Left Eye Near:    Bilateral Near:     Physical Exam  Constitutional: He is oriented to person, place, and time. He appears well-developed and well-nourished. No distress.  HENT:  Head: Normocephalic.  Right Ear: External ear normal.  Left Ear: External ear normal.  Nose: Nose normal.  Mouth/Throat: Oropharynx is clear and moist. No oropharyngeal exudate.  Eyes: Pupils are equal, round, and reactive to light. Right eye exhibits no discharge. Left eye exhibits no discharge.    Neck: Normal range of motion. Neck supple.  Pulmonary/Chest: Effort normal and breath sounds normal.  Musculoskeletal: Normal range of motion.  Lymphadenopathy:    He has no cervical adenopathy.  Neurological: He is alert and oriented to person, place, and time.  Skin: Skin is warm and dry. He is not diaphoretic.  Psychiatric: He has a normal mood and affect. His behavior is normal. Judgment and thought content normal.  Nursing note and vitals reviewed.    UC Treatments / Results  Labs (all labs ordered are listed, but only abnormal results are displayed) Labs Reviewed  RAPID STREP SCREEN ([facility] ONLY)  CULTURE, GROUP A STREP [facility])    EKG None Radiology No results found.  Procedures Procedures (including critical care time)  Medications Ordered in UC Medications - No data to display   Initial Impression / Assessment and Plan / UC Course  I have reviewed the triage vital signs and the nursing notes.  Pertinent labs & imaging results that were available during my care of the patient were reviewed by me and considered in my medical decision making (see chart for details).     Plan: 1. Test/x-ray results and diagnosis reviewed with patient 2. rx as per orders; risks, benefits, potential side effects reviewed with patient 3. Recommend supportive treatment with rest and fluids.  Use Motrin for sore throat and body aches as well as fever.  Told him this is likely a viral illness does not require antibiotics.  I will supply him with cough suppressants.  Cultures of the throat will be available in 48 hours.  These will be treated according to the results. 4. F/u prn if symptoms worsen or don't improve   Final Clinical Impressions(s) / UC Diagnoses   Final diagnoses:  Upper respiratory tract infection, unspecified type  Sore throat (viral)    ED Discharge Orders        Ordered    benzonatate (TESSALON) 200 MG capsule     07/13/17 0934     chlorpheniramine-HYDROcodone (TUSSIONEX PENNKINETIC ER) 10-8 MG/5ML SUER  2 times daily     07/13/17 0934       Controlled Substance Prescriptions [place] Controlled Substance Registry consulted? Not Applicable   Lutricia Feil, PA-C 07/13/17 1610

## 2017-07-13 NOTE — ED Triage Notes (Signed)
Pt reports fever since Thursday but still having sore throat and feeling bad. Did take tylenol and ibuprofen this morning. No other complaints

## 2017-07-15 LAB — CULTURE, GROUP A STREP (THRC)

## 2018-05-13 DIAGNOSIS — F39 Unspecified mood [affective] disorder: Secondary | ICD-10-CM | POA: Diagnosis not present

## 2018-05-13 DIAGNOSIS — F339 Major depressive disorder, recurrent, unspecified: Secondary | ICD-10-CM | POA: Diagnosis not present

## 2018-05-13 DIAGNOSIS — F419 Anxiety disorder, unspecified: Secondary | ICD-10-CM | POA: Diagnosis not present

## 2018-05-26 DIAGNOSIS — F320 Major depressive disorder, single episode, mild: Secondary | ICD-10-CM | POA: Diagnosis not present

## 2018-05-27 DIAGNOSIS — F39 Unspecified mood [affective] disorder: Secondary | ICD-10-CM | POA: Diagnosis not present

## 2018-05-27 DIAGNOSIS — F419 Anxiety disorder, unspecified: Secondary | ICD-10-CM | POA: Diagnosis not present

## 2018-06-10 DIAGNOSIS — F419 Anxiety disorder, unspecified: Secondary | ICD-10-CM | POA: Diagnosis not present

## 2018-06-10 DIAGNOSIS — F4310 Post-traumatic stress disorder, unspecified: Secondary | ICD-10-CM | POA: Diagnosis not present

## 2018-06-10 DIAGNOSIS — F3162 Bipolar disorder, current episode mixed, moderate: Secondary | ICD-10-CM | POA: Diagnosis not present

## 2018-06-17 DIAGNOSIS — F3162 Bipolar disorder, current episode mixed, moderate: Secondary | ICD-10-CM | POA: Diagnosis not present

## 2018-06-17 DIAGNOSIS — F4310 Post-traumatic stress disorder, unspecified: Secondary | ICD-10-CM | POA: Diagnosis not present

## 2018-06-17 DIAGNOSIS — F419 Anxiety disorder, unspecified: Secondary | ICD-10-CM | POA: Diagnosis not present

## 2018-06-24 DIAGNOSIS — F4310 Post-traumatic stress disorder, unspecified: Secondary | ICD-10-CM | POA: Diagnosis not present

## 2018-06-24 DIAGNOSIS — F3162 Bipolar disorder, current episode mixed, moderate: Secondary | ICD-10-CM | POA: Diagnosis not present

## 2018-06-24 DIAGNOSIS — F419 Anxiety disorder, unspecified: Secondary | ICD-10-CM | POA: Diagnosis not present

## 2018-06-30 DIAGNOSIS — F3162 Bipolar disorder, current episode mixed, moderate: Secondary | ICD-10-CM | POA: Diagnosis not present

## 2018-06-30 DIAGNOSIS — F4310 Post-traumatic stress disorder, unspecified: Secondary | ICD-10-CM | POA: Diagnosis not present

## 2018-06-30 DIAGNOSIS — F419 Anxiety disorder, unspecified: Secondary | ICD-10-CM | POA: Diagnosis not present

## 2018-07-08 DIAGNOSIS — G4700 Insomnia, unspecified: Secondary | ICD-10-CM | POA: Diagnosis not present

## 2018-07-08 DIAGNOSIS — F4310 Post-traumatic stress disorder, unspecified: Secondary | ICD-10-CM | POA: Diagnosis not present

## 2018-07-08 DIAGNOSIS — F3162 Bipolar disorder, current episode mixed, moderate: Secondary | ICD-10-CM | POA: Diagnosis not present

## 2018-07-08 DIAGNOSIS — F419 Anxiety disorder, unspecified: Secondary | ICD-10-CM | POA: Diagnosis not present

## 2018-07-14 DIAGNOSIS — G4700 Insomnia, unspecified: Secondary | ICD-10-CM | POA: Diagnosis not present

## 2018-07-14 DIAGNOSIS — F3112 Bipolar disorder, current episode manic without psychotic features, moderate: Secondary | ICD-10-CM | POA: Diagnosis not present

## 2018-07-14 DIAGNOSIS — F4310 Post-traumatic stress disorder, unspecified: Secondary | ICD-10-CM | POA: Diagnosis not present

## 2018-07-14 DIAGNOSIS — F3162 Bipolar disorder, current episode mixed, moderate: Secondary | ICD-10-CM | POA: Diagnosis not present

## 2018-07-14 DIAGNOSIS — F419 Anxiety disorder, unspecified: Secondary | ICD-10-CM | POA: Diagnosis not present

## 2018-07-22 DIAGNOSIS — G4700 Insomnia, unspecified: Secondary | ICD-10-CM | POA: Diagnosis not present

## 2018-07-22 DIAGNOSIS — F419 Anxiety disorder, unspecified: Secondary | ICD-10-CM | POA: Diagnosis not present

## 2018-07-22 DIAGNOSIS — F3162 Bipolar disorder, current episode mixed, moderate: Secondary | ICD-10-CM | POA: Diagnosis not present

## 2018-07-22 DIAGNOSIS — F4310 Post-traumatic stress disorder, unspecified: Secondary | ICD-10-CM | POA: Diagnosis not present

## 2018-07-28 DIAGNOSIS — F3162 Bipolar disorder, current episode mixed, moderate: Secondary | ICD-10-CM | POA: Diagnosis not present

## 2018-07-28 DIAGNOSIS — F4310 Post-traumatic stress disorder, unspecified: Secondary | ICD-10-CM | POA: Diagnosis not present

## 2018-07-28 DIAGNOSIS — F419 Anxiety disorder, unspecified: Secondary | ICD-10-CM | POA: Diagnosis not present

## 2018-07-28 DIAGNOSIS — G4700 Insomnia, unspecified: Secondary | ICD-10-CM | POA: Diagnosis not present

## 2018-08-05 DIAGNOSIS — F419 Anxiety disorder, unspecified: Secondary | ICD-10-CM | POA: Diagnosis not present

## 2018-08-05 DIAGNOSIS — G4700 Insomnia, unspecified: Secondary | ICD-10-CM | POA: Diagnosis not present

## 2018-08-05 DIAGNOSIS — F4310 Post-traumatic stress disorder, unspecified: Secondary | ICD-10-CM | POA: Diagnosis not present

## 2018-08-05 DIAGNOSIS — F3162 Bipolar disorder, current episode mixed, moderate: Secondary | ICD-10-CM | POA: Diagnosis not present

## 2018-08-11 DIAGNOSIS — F4310 Post-traumatic stress disorder, unspecified: Secondary | ICD-10-CM | POA: Diagnosis not present

## 2018-08-11 DIAGNOSIS — G4700 Insomnia, unspecified: Secondary | ICD-10-CM | POA: Diagnosis not present

## 2018-08-11 DIAGNOSIS — F419 Anxiety disorder, unspecified: Secondary | ICD-10-CM | POA: Diagnosis not present

## 2018-08-11 DIAGNOSIS — F3162 Bipolar disorder, current episode mixed, moderate: Secondary | ICD-10-CM | POA: Diagnosis not present

## 2018-08-22 DIAGNOSIS — F3162 Bipolar disorder, current episode mixed, moderate: Secondary | ICD-10-CM | POA: Diagnosis not present

## 2018-08-22 DIAGNOSIS — G4700 Insomnia, unspecified: Secondary | ICD-10-CM | POA: Diagnosis not present

## 2018-08-22 DIAGNOSIS — F419 Anxiety disorder, unspecified: Secondary | ICD-10-CM | POA: Diagnosis not present

## 2018-08-22 DIAGNOSIS — F4310 Post-traumatic stress disorder, unspecified: Secondary | ICD-10-CM | POA: Diagnosis not present

## 2018-08-25 DIAGNOSIS — F419 Anxiety disorder, unspecified: Secondary | ICD-10-CM | POA: Diagnosis not present

## 2018-08-25 DIAGNOSIS — F4310 Post-traumatic stress disorder, unspecified: Secondary | ICD-10-CM | POA: Diagnosis not present

## 2018-08-25 DIAGNOSIS — G4700 Insomnia, unspecified: Secondary | ICD-10-CM | POA: Diagnosis not present

## 2018-08-25 DIAGNOSIS — F3162 Bipolar disorder, current episode mixed, moderate: Secondary | ICD-10-CM | POA: Diagnosis not present

## 2018-09-06 DIAGNOSIS — Z20828 Contact with and (suspected) exposure to other viral communicable diseases: Secondary | ICD-10-CM | POA: Diagnosis not present

## 2018-09-08 DIAGNOSIS — F3162 Bipolar disorder, current episode mixed, moderate: Secondary | ICD-10-CM | POA: Diagnosis not present

## 2018-09-08 DIAGNOSIS — F419 Anxiety disorder, unspecified: Secondary | ICD-10-CM | POA: Diagnosis not present

## 2018-09-08 DIAGNOSIS — F4310 Post-traumatic stress disorder, unspecified: Secondary | ICD-10-CM | POA: Diagnosis not present

## 2018-09-08 DIAGNOSIS — G4700 Insomnia, unspecified: Secondary | ICD-10-CM | POA: Diagnosis not present

## 2018-10-06 DIAGNOSIS — G4700 Insomnia, unspecified: Secondary | ICD-10-CM | POA: Diagnosis not present

## 2018-10-06 DIAGNOSIS — F419 Anxiety disorder, unspecified: Secondary | ICD-10-CM | POA: Diagnosis not present

## 2018-10-06 DIAGNOSIS — F4310 Post-traumatic stress disorder, unspecified: Secondary | ICD-10-CM | POA: Diagnosis not present

## 2018-10-06 DIAGNOSIS — F3162 Bipolar disorder, current episode mixed, moderate: Secondary | ICD-10-CM | POA: Diagnosis not present

## 2018-10-07 DIAGNOSIS — F4310 Post-traumatic stress disorder, unspecified: Secondary | ICD-10-CM | POA: Diagnosis not present

## 2018-10-07 DIAGNOSIS — F3162 Bipolar disorder, current episode mixed, moderate: Secondary | ICD-10-CM | POA: Diagnosis not present

## 2018-10-07 DIAGNOSIS — F419 Anxiety disorder, unspecified: Secondary | ICD-10-CM | POA: Diagnosis not present

## 2018-10-07 DIAGNOSIS — G4700 Insomnia, unspecified: Secondary | ICD-10-CM | POA: Diagnosis not present

## 2018-10-21 DIAGNOSIS — F4310 Post-traumatic stress disorder, unspecified: Secondary | ICD-10-CM | POA: Diagnosis not present

## 2018-10-21 DIAGNOSIS — G4700 Insomnia, unspecified: Secondary | ICD-10-CM | POA: Diagnosis not present

## 2018-10-21 DIAGNOSIS — F419 Anxiety disorder, unspecified: Secondary | ICD-10-CM | POA: Diagnosis not present

## 2018-10-21 DIAGNOSIS — F3162 Bipolar disorder, current episode mixed, moderate: Secondary | ICD-10-CM | POA: Diagnosis not present

## 2018-10-22 DIAGNOSIS — F4310 Post-traumatic stress disorder, unspecified: Secondary | ICD-10-CM | POA: Diagnosis not present

## 2018-10-22 DIAGNOSIS — G4700 Insomnia, unspecified: Secondary | ICD-10-CM | POA: Diagnosis not present

## 2018-10-22 DIAGNOSIS — F3162 Bipolar disorder, current episode mixed, moderate: Secondary | ICD-10-CM | POA: Diagnosis not present

## 2018-10-22 DIAGNOSIS — F419 Anxiety disorder, unspecified: Secondary | ICD-10-CM | POA: Diagnosis not present

## 2018-11-07 DIAGNOSIS — F419 Anxiety disorder, unspecified: Secondary | ICD-10-CM | POA: Diagnosis not present

## 2018-11-07 DIAGNOSIS — G4700 Insomnia, unspecified: Secondary | ICD-10-CM | POA: Diagnosis not present

## 2018-11-07 DIAGNOSIS — F4310 Post-traumatic stress disorder, unspecified: Secondary | ICD-10-CM | POA: Diagnosis not present

## 2018-11-07 DIAGNOSIS — F3162 Bipolar disorder, current episode mixed, moderate: Secondary | ICD-10-CM | POA: Diagnosis not present

## 2018-11-20 DIAGNOSIS — F419 Anxiety disorder, unspecified: Secondary | ICD-10-CM | POA: Diagnosis not present

## 2018-11-20 DIAGNOSIS — F3162 Bipolar disorder, current episode mixed, moderate: Secondary | ICD-10-CM | POA: Diagnosis not present

## 2018-11-20 DIAGNOSIS — G4700 Insomnia, unspecified: Secondary | ICD-10-CM | POA: Diagnosis not present

## 2018-11-20 DIAGNOSIS — F4310 Post-traumatic stress disorder, unspecified: Secondary | ICD-10-CM | POA: Diagnosis not present

## 2018-12-17 DIAGNOSIS — G4700 Insomnia, unspecified: Secondary | ICD-10-CM | POA: Diagnosis not present

## 2018-12-17 DIAGNOSIS — F3162 Bipolar disorder, current episode mixed, moderate: Secondary | ICD-10-CM | POA: Diagnosis not present

## 2018-12-17 DIAGNOSIS — F4310 Post-traumatic stress disorder, unspecified: Secondary | ICD-10-CM | POA: Diagnosis not present

## 2018-12-17 DIAGNOSIS — F419 Anxiety disorder, unspecified: Secondary | ICD-10-CM | POA: Diagnosis not present

## 2018-12-18 DIAGNOSIS — G4700 Insomnia, unspecified: Secondary | ICD-10-CM | POA: Diagnosis not present

## 2018-12-18 DIAGNOSIS — F4310 Post-traumatic stress disorder, unspecified: Secondary | ICD-10-CM | POA: Diagnosis not present

## 2018-12-18 DIAGNOSIS — F3162 Bipolar disorder, current episode mixed, moderate: Secondary | ICD-10-CM | POA: Diagnosis not present

## 2018-12-18 DIAGNOSIS — F419 Anxiety disorder, unspecified: Secondary | ICD-10-CM | POA: Diagnosis not present

## 2019-01-16 DIAGNOSIS — F419 Anxiety disorder, unspecified: Secondary | ICD-10-CM | POA: Diagnosis not present

## 2019-01-16 DIAGNOSIS — G4700 Insomnia, unspecified: Secondary | ICD-10-CM | POA: Diagnosis not present

## 2019-01-16 DIAGNOSIS — F3162 Bipolar disorder, current episode mixed, moderate: Secondary | ICD-10-CM | POA: Diagnosis not present

## 2019-01-16 DIAGNOSIS — F4310 Post-traumatic stress disorder, unspecified: Secondary | ICD-10-CM | POA: Diagnosis not present

## 2019-02-13 DIAGNOSIS — F4310 Post-traumatic stress disorder, unspecified: Secondary | ICD-10-CM | POA: Diagnosis not present

## 2019-02-13 DIAGNOSIS — F3162 Bipolar disorder, current episode mixed, moderate: Secondary | ICD-10-CM | POA: Diagnosis not present

## 2019-02-13 DIAGNOSIS — G4700 Insomnia, unspecified: Secondary | ICD-10-CM | POA: Diagnosis not present

## 2019-02-13 DIAGNOSIS — F419 Anxiety disorder, unspecified: Secondary | ICD-10-CM | POA: Diagnosis not present

## 2019-03-23 DIAGNOSIS — F3162 Bipolar disorder, current episode mixed, moderate: Secondary | ICD-10-CM | POA: Diagnosis not present

## 2019-03-23 DIAGNOSIS — F419 Anxiety disorder, unspecified: Secondary | ICD-10-CM | POA: Diagnosis not present

## 2019-03-23 DIAGNOSIS — G4700 Insomnia, unspecified: Secondary | ICD-10-CM | POA: Diagnosis not present

## 2019-06-25 ENCOUNTER — Ambulatory Visit: Payer: Managed Care, Other (non HMO) | Attending: Internal Medicine

## 2019-06-25 DIAGNOSIS — Z23 Encounter for immunization: Secondary | ICD-10-CM

## 2019-06-25 NOTE — Progress Notes (Signed)
   Covid-19 Vaccination Clinic  Name:  Chase Hampton    MRN: [id number] DOB: 1990-10-02  06/25/2019  [name] was observed post Covid-19 immunization for 15 minutes without incident. He was provided with Vaccine Information Sheet and instruction to access the V-Safe system.   [name] was instructed to call 911 with any severe reactions post vaccine: Marland Kitchen Difficulty breathing  . Swelling of face and throat  . A fast heartbeat  . A bad rash all over body  . Dizziness and weakness   Immunizations Administered    Name Date Dose VIS Date Route   Pfizer COVID-19 Vaccine 06/25/2019 12:11 PM 0.3 mL 03/06/2019 Intramuscular   Manufacturer: [employer], [employer]   Lot: [id number]   NDC: [address]-0

## 2019-07-22 ENCOUNTER — Other Ambulatory Visit: Payer: Self-pay

## 2019-07-22 ENCOUNTER — Ambulatory Visit: Payer: Managed Care, Other (non HMO) | Attending: Internal Medicine

## 2019-07-22 DIAGNOSIS — Z23 Encounter for immunization: Secondary | ICD-10-CM

## 2019-07-22 NOTE — Progress Notes (Signed)
   Covid-19 Vaccination Clinic  Name:  Chase Hampton    MRN: [id number] DOB: [birth year]  07/22/2019  Chase Hampton was observed post Covid-19 immunization for 15 minutes without incident. He was provided with Vaccine Information Sheet and instruction to access the V-Safe system.   Chase Hampton was instructed to call 911 with any severe reactions post vaccine: Marland Kitchen Difficulty breathing  . Swelling of face and throat  . A fast heartbeat  . A bad rash all over body  . Dizziness and weakness   Immunizations Administered    Name Date Dose VIS Date Route   Pfizer COVID-19 Vaccine 07/22/2019 12:58 PM 0.3 mL 05/20/2018 Intramuscular   Manufacturer: [employer], [employer]   Lot: [id number]   NDC: [address]-9

## 2021-12-14 ENCOUNTER — Encounter: Payer: Self-pay | Admitting: Family Medicine

## 2021-12-14 ENCOUNTER — Ambulatory Visit (INDEPENDENT_AMBULATORY_CARE_PROVIDER_SITE_OTHER): Payer: Managed Care, Other (non HMO) | Admitting: Family Medicine

## 2021-12-14 VITALS — BP 120/71 | HR 77 | Ht 70.0 in | Wt 270.0 lb

## 2021-12-14 DIAGNOSIS — R7309 Other abnormal glucose: Secondary | ICD-10-CM

## 2021-12-14 DIAGNOSIS — E669 Obesity, unspecified: Secondary | ICD-10-CM

## 2021-12-14 DIAGNOSIS — F3178 Bipolar disorder, in full remission, most recent episode mixed: Secondary | ICD-10-CM

## 2021-12-14 DIAGNOSIS — Z23 Encounter for immunization: Secondary | ICD-10-CM | POA: Diagnosis not present

## 2021-12-14 DIAGNOSIS — Z7689 Persons encountering health services in other specified circumstances: Secondary | ICD-10-CM

## 2021-12-14 DIAGNOSIS — Z8249 Family history of ischemic heart disease and other diseases of the circulatory system: Secondary | ICD-10-CM

## 2021-12-14 DIAGNOSIS — Z833 Family history of diabetes mellitus: Secondary | ICD-10-CM

## 2021-12-14 DIAGNOSIS — E7800 Pure hypercholesterolemia, unspecified: Secondary | ICD-10-CM

## 2021-12-14 DIAGNOSIS — Z79899 Other long term (current) drug therapy: Secondary | ICD-10-CM

## 2021-12-14 NOTE — Assessment & Plan Note (Signed)
Bipolar Type 1 mixed, in remission Followed by Psychiatry - Idelia Salm PA ([facility]) No longer with therapist CBT Controlled on med management Reviewed and updated med rec today Lamictal 200mg  x 1.5 tab = 300mg  daily PM Seroquel 100mg  QHS Wellbutrin XL 150mg  daily PRN for depressive episodes only Continue current course

## 2021-12-14 NOTE — Patient Instructions (Addendum)
Thank you for coming to the office today.  Keep up the great the work overall! It seems like you are doing very well and I encourage you to continue with your Psychiatry for further management of your meds.  Spot on back is likely a Lipoma  DUE for FASTING BLOOD WORK (no food or drink after midnight before the lab appointment, only water or coffee without cream/sugar on the morning of)  SCHEDULE "Lab Only" visit in the morning at the clinic for lab draw in 1 YEAR  - Make sure Lab Only appointment is at about 1 week before your next appointment, so that results will be available  For Lab Results, once available within 2-3 days of blood draw, you can can log in to MyChart online to view your results and a brief explanation. Also, we can discuss results at next follow-up visit.   Please schedule a Follow-up Appointment to: Return in about 1 year (around 12/15/2022) for 1 year fasting lab only then 1 week later Annual Physical.  If you have any other questions or concerns, please feel free to call the office or send a message through [place]. You may also schedule an earlier appointment if necessary.  Additionally, you may be receiving a survey about your experience at our office within a few days to 1 week by e-mail or mail. We value your feedback.  Nobie Putnam, DO [place]

## 2021-12-14 NOTE — Progress Notes (Signed)
Subjective:    Patient ID: Chase Hampton, male    DOB: Aug 19, 1990, 31 y.o.   MRN: [id number]  Chase Hampton is a 31 y.o. male presenting on 12/14/2021 for Establish Care  Re-establishing care today, last seen 2018  HPI  Specialists: Psychiatry - Idelia Salm PA ([facility])  Bipolar Type 1, mixed, in remission History of mania  [facility]) Followed for mental health by Psychiatry and Therapist. He is mostly on virtual follow up with them and they prescribe and manage his medications and his condition is well treated. He is no longer needing CBT therapist at this time. Wellbutrin PRN depressive episodes - very rarely taking.  BMI >38 Wt down 15 lbs since Feb 2023 Diet improving Walking more with dogs Weight lifting exercising 3 x week currently   History of Neuroleptic Malignant Syndrome / Complex Acute ICU hospitalization 04/2015) - Significant past history of suspected NMS with acute respiratory failure, bacteremia, and possible seizures, hospitalized at [facility] ICU (intubated in Neuro ICU). Patient briefly reviewed this course, does not recall much from time being critically ill, chart available and was reviewed.   PMH Tachycardia Has improved   HM Due for Flu Shot     12/14/2021    9:09 AM 06/12/2016    9:57 AM 08/15/2015    3:09 PM  Depression screen PHQ 2/9  Decreased Interest 0 0 1  Down, Depressed, Hopeless 0 0 2  PHQ - 2 Score 0 0 3  Altered sleeping 1 1 2   Tired, decreased energy 0 1 2  Change in appetite 0 0 1  Feeling bad or failure about yourself  0 1 2  Trouble concentrating 0 0 0  Moving slowly or fidgety/restless 0 0 1  Suicidal thoughts 0 0 0  PHQ-9 Score 1 3 11   Difficult doing work/chores Not difficult at all  Somewhat difficult    Past Medical History:  Diagnosis Date   Anxiety    Bipolar 1 disorder (HCC)    Depression    GERD (gastroesophageal reflux disease)    Past Surgical History:  Procedure Laterality Date    NO PAST SURGERIES     Social History   Socioeconomic History   Marital status: Single    Spouse name: Not on file   Number of children: Not on file   Years of education: Not on file   Highest education level: Not on file  Occupational History   Not on file  Tobacco Use   Smoking status: Former    Types: Cigars   Smokeless tobacco: Never  Vaping Use   Vaping Use: Never used  Substance and Sexual Activity   Alcohol use: Yes    Alcohol/week: 2.0 standard drinks of alcohol    Types: 2 Standard drinks or equivalent per week   Drug use: Yes    Types: Marijuana   Sexual activity: Never  Other Topics Concern   Not on file  Social History Narrative   Not on file   Social Determinants of Health   Financial Resource Strain: Not on file  Food Insecurity: Not on file  Transportation Needs: Not on file  Physical Activity: Not on file  Stress: Not on file  Social Connections: Not on file  Intimate Partner Violence: Not on file   Family History  Problem Relation Age of Onset   Heart disease Maternal Grandfather    Breast cancer Paternal Grandmother    Diabetes Father    Hyperlipidemia Maternal Grandmother  Diabetes Paternal Grandfather    Heart disease Paternal Grandfather    Hyperlipidemia Paternal Grandfather    Colon cancer Neg Hx    Prostate cancer Neg Hx    Current Outpatient Medications on File Prior to Visit  Medication Sig   buPROPion (WELLBUTRIN XL) 150 MG 24 hr tablet Take 1 tablet (150 mg total) by mouth daily as needed (depression).   lamoTRIgine (LAMICTAL) 200 MG tablet Take 300 mg by mouth at bedtime. One and half tablet = 300mg    QUEtiapine (SEROQUEL) 100 MG tablet Take 100 mg by mouth at bedtime.   No current facility-administered medications on file prior to visit.    Review of Systems  Constitutional:  Negative for activity change, appetite change, chills, diaphoresis, fatigue and fever.  HENT:  Negative for congestion and hearing loss.   Eyes:   Negative for visual disturbance.  Respiratory:  Negative for cough, chest tightness, shortness of breath and wheezing.   Cardiovascular:  Negative for chest pain, palpitations and leg swelling.  Gastrointestinal:  Negative for abdominal pain, constipation, diarrhea, nausea and vomiting.  Genitourinary:  Negative for dysuria, frequency and hematuria.  Musculoskeletal:  Negative for arthralgias and neck pain.  Skin:  Negative for rash.  Neurological:  Negative for dizziness, weakness, light-headedness, numbness and headaches.  Hematological:  Negative for adenopathy.  Psychiatric/Behavioral:  Negative for behavioral problems, dysphoric mood and sleep disturbance.    Per HPI unless specifically indicated above     Objective:    BP 120/71   Pulse 77   Ht 5\' 10"  (1.778 m)   Wt 270 lb (122.5 kg)   SpO2 99%   BMI 38.74 kg/m   Wt Readings from Last 3 Encounters:  12/14/21 270 lb (122.5 kg)  07/13/17 280 lb (127 kg)  06/12/16 269 lb (122 kg)    Physical Exam Vitals and nursing note reviewed.  Constitutional:      General: He is not in acute distress.    Appearance: He is well-developed. He is obese. He is not diaphoretic.     Comments: Well-appearing, comfortable, cooperative  HENT:     Head: Normocephalic and atraumatic.  Eyes:     General:        Right eye: No discharge.        Left eye: No discharge.     Conjunctiva/sclera: Conjunctivae normal.     Pupils: Pupils are equal, round, and reactive to light.  Neck:     Thyroid: No thyromegaly.     Vascular: No carotid bruit.  Cardiovascular:     Rate and Rhythm: Normal rate and regular rhythm.     Pulses: Normal pulses.     Heart sounds: Normal heart sounds. No murmur heard. Pulmonary:     Effort: Pulmonary effort is normal. No respiratory distress.     Breath sounds: Normal breath sounds. No wheezing or rales.  Abdominal:     General: Bowel sounds are normal. There is no distension.     Palpations: Abdomen is soft. There  is no mass.     Tenderness: There is no abdominal tenderness.  Musculoskeletal:        General: No tenderness. Normal range of motion.     Cervical back: Normal range of motion and neck supple.     Right lower leg: No edema.     Left lower leg: No edema.     Comments: Upper / Lower Extremities: - Normal muscle tone, strength bilateral upper extremities 5/5, lower extremities 5/5  Lymphadenopathy:  Cervical: No cervical adenopathy.  Skin:    General: Skin is warm and dry.     Findings: Lesion (2-3 cm lipoma R lower back/flank) present. No erythema or rash.  Neurological:     Mental Status: He is alert and oriented to person, place, and time.     Comments: Distal sensation intact to light touch all extremities  Psychiatric:        Mood and Affect: Mood normal.        Behavior: Behavior normal.        Thought Content: Thought content normal.     Comments: Well groomed, good eye contact, normal speech and thoughts      Results for orders placed or performed during the hospital encounter of 07/13/17  Rapid Strep Screen ([facility] & [facility] ONLY)   Specimen: Oral Mucosa/Gingiva; Other  Result Value Ref Range   Streptococcus, Group A Screen (Direct) NEGATIVE NEGATIVE  Culture, group A strep   Specimen: Throat  Result Value Ref Range   Specimen Description      THROAT Performed at [facility], [address], [place], [place] [address]    Special Requests      NONE Reflexed from [id number] Performed at [facility], [address] [place], [place] [address]    Culture      NO GROUP A STREP (S.PYOGENES) ISOLATED Performed at [facility], [place] [address], [place], [place] [address]    Report Status 07/15/2017 FINAL       Assessment & Plan:   Problem List Items Addressed This Visit     Abnormal glucose   Relevant Orders   Hemoglobin A1c   Bipolar 1 disorder, mixed, full remission ([place]) - Primary    Bipolar Type 1 mixed, in remission Followed by  Psychiatry - Idelia Salm PA ([facility]) No longer with therapist CBT Controlled on med management Reviewed and updated med rec today Lamictal 200mg  x 1.5 tab = 300mg  daily PM Seroquel 100mg  QHS Wellbutrin XL 150mg  daily PRN for depressive episodes only Continue current course      Relevant Medications   buPROPion (WELLBUTRIN XL) 150 MG 24 hr tablet   Other Relevant Orders   COMPLETE METABOLIC PANEL WITH GFR   CBC with Differential/Platelet   Other Visit Diagnoses     Encounter to establish care with new doctor       Needs flu shot       Relevant Orders   Flu Vaccine QUAD 57mo+IM (Fluarix, Fluzone & Alfiuria Quad PF) (Completed)   Family history of diabetes mellitus (DM)       Relevant Orders   Hemoglobin A1c   Family history of heart disease       Relevant Orders   Lipid panel   Elevated LDL cholesterol level       Relevant Orders   COMPLETE METABOLIC PANEL WITH GFR   Lipid panel   TSH   Long-term use of high-risk medication       Relevant Orders   COMPLETE METABOLIC PANEL WITH GFR   CBC with Differential/Platelet   Lipid panel   Hemoglobin A1c   TSH   Obesity (BMI 35.0-39.9 without comorbidity)          Establish care  Updated Health Maintenance information Fasting labs ordered today Encouraged improvement to lifestyle with diet and exercise Goal of weight loss  Flu Shot today  Orders Placed This Encounter  Procedures   Flu Vaccine QUAD 80mo+IM (Fluarix, Fluzone &  Alfiuria Quad PF)   COMPLETE METABOLIC PANEL WITH GFR   CBC with Differential/Platelet   Lipid panel    Order Specific Question:   Has the patient fasted?    Answer:   Yes   Hemoglobin A1c   TSH      No orders of the defined types were placed in this encounter.    Follow up plan: Return in about 1 year (around 12/15/2022) for 1 year fasting lab only then 1 week later Annual Physical.  Future labs after review current.  Nobie Putnam, [facility] 12/14/2021, 9:15 AM

## 2021-12-15 ENCOUNTER — Other Ambulatory Visit: Payer: Self-pay | Admitting: Family Medicine

## 2021-12-15 DIAGNOSIS — R7989 Other specified abnormal findings of blood chemistry: Secondary | ICD-10-CM

## 2021-12-15 LAB — COMPLETE METABOLIC PANEL WITH GFR
AG Ratio: 1.7 (calc) (ref 1.0–2.5)
ALT: 32 U/L (ref 9–46)
AST: 16 U/L (ref 10–40)
Albumin: 4.4 g/dL (ref 3.6–5.1)
Alkaline phosphatase (APISO): 85 U/L (ref 36–130)
BUN/Creatinine Ratio: 11 (calc) (ref 6–22)
BUN: 15 mg/dL (ref 7–25)
CO2: 28 mmol/L (ref 20–32)
Calcium: 9.6 mg/dL (ref 8.6–10.3)
Chloride: 103 mmol/L (ref 98–110)
Creat: 1.37 mg/dL — ABNORMAL HIGH (ref 0.60–1.24)
Globulin: 2.6 g/dL (calc) (ref 1.9–3.7)
Glucose, Bld: 105 mg/dL — ABNORMAL HIGH (ref 65–99)
Potassium: 4.1 mmol/L (ref 3.5–5.3)
Sodium: 141 mmol/L (ref 135–146)
Total Bilirubin: 0.7 mg/dL (ref 0.2–1.2)
Total Protein: 7 g/dL (ref 6.1–8.1)
eGFR: 72 mL/min/{1.73_m2} (ref >=60)

## 2021-12-15 LAB — HEMOGLOBIN A1C
Hgb A1c MFr Bld: 5.2 % of total Hgb (ref <5.7)
Mean Plasma Glucose: 103 mg/dL
eAG (mmol/L): 5.7 mmol/L

## 2021-12-15 LAB — CBC WITH DIFFERENTIAL/PLATELET
Absolute Monocytes: 429 cells/uL (ref 200–950)
Basophils Absolute: 39 cells/uL (ref 0–200)
Basophils Relative: 0.7 %
Eosinophils Absolute: 231 cells/uL (ref 15–500)
Eosinophils Relative: 4.2 %
HCT: 43.8 % (ref 38.5–50.0)
Hemoglobin: 15.2 g/dL (ref 13.2–17.1)
Lymphs Abs: 1876 cells/uL (ref 850–3900)
MCH: 29.2 pg (ref 27.0–33.0)
MCHC: 34.7 g/dL (ref 32.0–36.0)
MCV: 84.2 fL (ref 80.0–100.0)
MPV: 11.9 fL (ref 7.5–12.5)
Monocytes Relative: 7.8 %
Neutro Abs: 2926 cells/uL (ref 1500–7800)
Neutrophils Relative %: 53.2 %
Platelets: 183 10*3/uL (ref 140–400)
RBC: 5.2 10*6/uL (ref 4.20–5.80)
RDW: 13.1 % (ref 11.0–15.0)
Total Lymphocyte: 34.1 %
WBC: 5.5 10*3/uL (ref 3.8–10.8)

## 2021-12-15 LAB — LIPID PANEL
Cholesterol: 174 mg/dL (ref <200)
HDL: 47 mg/dL (ref >=40)
LDL Cholesterol (Calc): 103 mg/dL (calc) — ABNORMAL HIGH
Non-HDL Cholesterol (Calc): 127 mg/dL (calc) (ref <130)
Total CHOL/HDL Ratio: 3.7 (calc) (ref <5.0)
Triglycerides: 143 mg/dL (ref <150)

## 2021-12-15 LAB — TSH: TSH: 2.29 mIU/L (ref 0.40–4.50)

## 2022-03-13 ENCOUNTER — Ambulatory Visit: Payer: Self-pay | Admitting: Family Medicine

## 2022-04-23 ENCOUNTER — Ambulatory Visit: Payer: Self-pay | Admitting: Family Medicine

## 2022-09-25 ENCOUNTER — Other Ambulatory Visit: Payer: Self-pay

## 2022-09-25 ENCOUNTER — Ambulatory Visit (INDEPENDENT_AMBULATORY_CARE_PROVIDER_SITE_OTHER): Payer: Managed Care, Other (non HMO)

## 2022-09-25 ENCOUNTER — Ambulatory Visit
Admission: EM | Admit: 2022-09-25 | Discharge: 2022-09-25 | Disposition: A | Discharge status: Home / Self Care | Payer: Managed Care, Other (non HMO) | Attending: Physician Assistant | Admitting: Physician Assistant

## 2022-09-25 ENCOUNTER — Encounter: Payer: Self-pay | Admitting: *Deleted

## 2022-09-25 DIAGNOSIS — M79671 Pain in right foot: Secondary | ICD-10-CM

## 2022-09-25 MED ORDER — NAPROXEN 500 MG PO TABS: ORAL_TABLET | ORAL | 0 refills | Status: DC

## 2022-09-25 NOTE — ED Provider Notes (Signed)
[facility]    CSN: [id number] Arrival date & time: 09/25/22  1104      History   Chief Complaint Chief Complaint  Patient presents with   Foot Pain    HPI Chase Hampton is a 32 y.o. male presenting for right midfoot pain for the past couple days.  He denies injury.  He says he has a lot of pain when trying to bear weight on the foot.  He reports fracturing this area 10 to 15 years ago and says the pain is currently in the same area where he had a fracture.  He denies any chronic pain in this area.  No recent change to his activities.  No numbness or weakness.  He feels that the foot is swollen.  He has taken Tylenol for pain which has helped slightly.  No history of arthritis or gout.  No other complaints.  HPI  Past Medical History:  Diagnosis Date   Anxiety    Bipolar 1 disorder (HCC)    Depression    GERD (gastroesophageal reflux disease)     Patient Active Problem List   Diagnosis Date Noted   Bipolar 1 disorder, mixed, full remission (HCC) 12/14/2021   Tachycardia 06/12/2016   Skin lesion of left arm 06/12/2016   Abnormal glucose 06/12/2016   Hyperlipidemia 06/12/2016   Eczema 08/15/2015   History of neuroleptic malignant syndrome     Past Surgical History:  Procedure Laterality Date   NO PAST SURGERIES         Home Medications    Prior to Admission medications   Medication Sig Start Date End Date Taking? Authorizing Provider  finasteride (PROPECIA) 1 MG tablet Take 1 mg by mouth daily.   Yes [provider]  lamoTRIgine (LAMICTAL) 200 MG tablet Take 300 mg by mouth at bedtime. One and half tablet = 300mg  09/29/21  Yes [provider]  naproxen (NAPROSYN) 500 MG tablet Take 1 tab po BID with foot PRN foot pain 09/25/22  Yes Eusebio Friendly B, PA-C  QUEtiapine (SEROQUEL) 100 MG tablet Take 100 mg by mouth at bedtime. 12/05/21  Yes [provider]  buPROPion (WELLBUTRIN XL) 150 MG 24 hr tablet Take 1 tablet (150 mg total)  by mouth daily as needed (depression). 12/14/21   Smitty Cords, DO    Family History Family History  Problem Relation Age of Onset   Heart disease Maternal Grandfather    Breast cancer Paternal Grandmother    Diabetes Father    Hyperlipidemia Maternal Grandmother    Diabetes Paternal Grandfather    Heart disease Paternal Grandfather    Hyperlipidemia Paternal Grandfather    Colon cancer Neg Hx    Prostate cancer Neg Hx     Social History Social History   Tobacco Use   Smoking status: Former    Types: Cigars   Smokeless tobacco: Never  Vaping Use   Vaping Use: Never used  Substance Use Topics   Alcohol use: Yes    Alcohol/week: 2.0 standard drinks of alcohol    Types: 2 Standard drinks or equivalent per week   Drug use: Yes    Types: Marijuana     Allergies   Haldol [haloperidol lactate]   Review of Systems Review of Systems  Constitutional:  Negative for fever.  Musculoskeletal:  Positive for arthralgias, gait problem and joint swelling.  Skin:  Negative for color change and wound.  Neurological:  Negative for weakness and numbness.     Physical  Exam Triage Vital Signs ED Triage Vitals  Enc Vitals Group     BP 09/25/22 1116 (!) 150/95     Pulse Rate 09/25/22 1116 85     Resp 09/25/22 1116 18     Temp 09/25/22 1116 98.4 F (36.9 C)     Temp src --      SpO2 09/25/22 1116 95 %     Weight --      Height --      Head Circumference --      Peak Flow --      Pain Score 09/25/22 1112 7     Pain Loc --      Pain Edu? --      Excl. in GC? --    No data found.  Updated Vital Signs BP (!) 150/95   Pulse 85   Temp 98.4 F (36.9 C)   Resp 18   SpO2 95%       Physical Exam Vitals and nursing note reviewed.  Constitutional:      General: He is not in acute distress.    Appearance: Normal appearance. He is well-developed. He is not ill-appearing.  HENT:     Head: Normocephalic and atraumatic.  Eyes:     General: No scleral icterus.     Conjunctiva/sclera: Conjunctivae normal.  Cardiovascular:     Rate and Rhythm: Normal rate.     Pulses: Normal pulses.  Pulmonary:     Effort: Pulmonary effort is normal. No respiratory distress.  Musculoskeletal:     Cervical back: Neck supple.     Right foot: Normal range of motion. Tenderness (TTP 3-5 metatarsals.) present. No swelling. Normal pulse.     Comments: Flat feet.  Skin:    General: Skin is warm and dry.     Capillary Refill: Capillary refill takes less than 2 seconds.  Neurological:     General: No focal deficit present.     Mental Status: He is alert. Mental status is at baseline.     Motor: No weakness.     Gait: Gait normal.  Psychiatric:        Mood and Affect: Mood normal.      UC Treatments / Results  Labs (all labs ordered are listed, but only abnormal results are displayed) Labs Reviewed - No data to display  EKG   Radiology DG Foot Complete Right  Result Date: 09/25/2022 CLINICAL DATA:  Pain and swelling EXAM: RIGHT FOOT COMPLETE - 3+ VIEW COMPARISON:  10/08/2007 FINDINGS: No recent fracture or dislocation is seen. There are no focal lytic lesions. There is interval healing of fracture of fourth metatarsal. There are no opaque foreign bodies. Small bony protuberance seen in the distal phalanx of big toe appears stable. Minimal bony spurs are seen in first metatarsophalangeal joint. IMPRESSION: No acute findings are seen in right foot. Electronically Signed   By: Ernie Avena M.D.   On: 09/25/2022 12:14    Procedures Procedures (including critical care time)  Medications Ordered in UC Medications - No data to display  Initial Impression / Assessment and Plan / UC Course  I have reviewed the triage vital signs and the nursing notes.  Pertinent labs & imaging results that were available during my care of the patient were reviewed by me and considered in my medical decision making (see chart for details).   32 y/o male presents for atraumatic  right midfoot pain. Hx of fracture of 4th metatarsal and pain is in the same  area. Taking Tylenol.   X-ray of right foot obtained.  Normal x-ray.  Discussed results with patient.  Advised RICE guidelines.  Sent naproxen to pharmacy.  Advised he may also take Tylenol.  Discussed shoe inserts since he does have a flatfoot.  Reviewed return and Ortho precautions.   Final Clinical Impressions(s) / UC Diagnoses   Final diagnoses:  Right foot pain     Discharge Instructions      -X-ray does not show any acute abnormalities.  -You discomfort could be related to your foot being flat and poor arch support. Consider shoe inserts. -Ice and elevate foot.  -Take tylenol and NSAIDs as needed for pain.  -If no improvement in the next week or worsening of symptoms please follow up with orthopedics.  You have a condition requiring you to follow up with Orthopedics so please call one of the following office for appointment:   [facility] [address] [place], [place] [address] Phone: [phone]  [facility] [address] [place], [place] [address] Phone: [phone]      ED Prescriptions     Medication Sig Dispense Auth. Provider   naproxen (NAPROSYN) 500 MG tablet Take 1 tab po BID with foot PRN foot pain 30 tablet Shirlee Latch, PA-C      I have reviewed the PDMP during this encounter.   Shirlee Latch, PA-C 09/25/22 1228

## 2022-09-25 NOTE — ED Triage Notes (Signed)
Pt reports RT foot pain with out injury. Pt reports he has a difficult time walking due to pain and swelling. Pt broke the 4th metatarsal in the past. Pt says pain feels the same as when he broke foot.

## 2022-09-25 NOTE — Discharge Instructions (Addendum)
-  X-ray does not show any acute abnormalities.  -You discomfort could be related to your foot being flat and poor arch support. Consider shoe inserts. -Ice and elevate foot.  -Take tylenol and NSAIDs as needed for pain.  -If no improvement in the next week or worsening of symptoms please follow up with orthopedics.  You have a condition requiring you to follow up with Orthopedics so please call one of the following office for appointment:   [facility] [address] [place], [place] [address] Phone: [phone]  [facility] [address] [place], [place] [address] Phone: [phone]

## 2023-05-03 ENCOUNTER — Ambulatory Visit: Payer: Managed Care, Other (non HMO) | Admitting: Family Medicine

## 2023-05-03 VITALS — BP 130/80 | HR 90 | Ht 70.0 in | Wt 268.0 lb

## 2023-05-03 DIAGNOSIS — Z79899 Other long term (current) drug therapy: Secondary | ICD-10-CM

## 2023-05-03 DIAGNOSIS — R7309 Other abnormal glucose: Secondary | ICD-10-CM | POA: Diagnosis not present

## 2023-05-03 DIAGNOSIS — Z0000 Encounter for general adult medical examination without abnormal findings: Secondary | ICD-10-CM

## 2023-05-03 DIAGNOSIS — Z833 Family history of diabetes mellitus: Secondary | ICD-10-CM | POA: Diagnosis not present

## 2023-05-03 DIAGNOSIS — E7800 Pure hypercholesterolemia, unspecified: Secondary | ICD-10-CM

## 2023-05-03 DIAGNOSIS — F3178 Bipolar disorder, in full remission, most recent episode mixed: Secondary | ICD-10-CM

## 2023-05-03 NOTE — Patient Instructions (Addendum)
 Thank you for coming to the office today.  Consider Vraylar, talk to the Psychiatry, it should not have the weight component. It can be very effective monotherapy or adjunct  Consider Psychiatry options  [facility] [address] [facility] [place], [id number] Phone: [phone] (Option 1) [email or web address]  Let me know if when you find a Psychiatry that you are interested in and I can look them up as well.  Consider Neuropsychology referral in the future  Monitor BP  DUE for FASTING BLOOD WORK (no food or drink after midnight before the lab appointment, only water  or coffee without cream/sugar on the morning of)  Next week   For Lab Results, once available within 2-3 days of blood draw, you can can log in to MyChart online to view your results and a brief explanation. Also, we can discuss results at next follow-up visit.    Please schedule a Follow-up Appointment to: Return for 1 year fasting lab > 1 week later Annual Physical.  If you have any other questions or concerns, please feel free to call the office or send a message through MyChart. You may also schedule an earlier appointment if necessary.  Additionally, you may be receiving a survey about your experience at our office within a few days to 1 week by e-mail or mail. We value your feedback.  Marsa Officer, DO [facility], [place]

## 2023-05-03 NOTE — Progress Notes (Signed)
 Subjective:    Patient ID: Chase Hampton, male    DOB: 19-Oct-1990, 33 y.o.   MRN: [id number]  Chase Hampton is a 33 y.o. male presenting on 05/03/2023 for Annual Exam   HPI  Discussed the use of AI scribe software for clinical note transcription with the patient, who gave verbal consent to proceed.  History of Present Illness    Chase Hampton is a 33 year old male with bipolar disorder who presents for an annual physical exam.     Bipolar Type 1, mixed, in remission History of mania Followed by [facility]) Currently doing well mostly in remission with control of symptoms. He says some issue with changing to various psychiatry due to structure of [facility]. He has done well with medication management Meds - Lamictal 300m gdaily, Quetiapine 100mg  nightly, has Bupropion XL 150mg  AS NEEDED only Interested to change to other Psychiatry to have more continuity in future.  He experiences cognitive issues such as 'cloudiness' and difficulty finding words, which he attributes to either his condition or medication side effects. He is considering a neuropsychological evaluation to assess cognitive function.  He has difficulty with sleep, stating that Seroquel helps him sleep but leaves him feeling 'unconscious for eight hours' and unable to wake up easily. He also has trouble falling and staying asleep, which affects his mood stability.   Morbid Obesity BMI >38 His weight is stable around 263 pounds, with difficulty losing more weight despite tracking his caloric intake. He attributes some of this difficulty to Seroquel, which he believes contributes to weight gain. He has previously lost weight during a manic episode but does not count that as healthy weight loss.   Elevated BP. Not HTN He experiences fluid retention, especially when sitting at his desk for extended periods, but notes that it improves with activity. He monitors his blood pressure at home and acknowledges that  stress may elevate it at times.    PMH Neuroleptic Malignant Syndrome / Complex Acute ICU hospitalization 04/2015)      05/03/2023    3:55 PM 12/14/2021    9:09 AM 06/12/2016    9:57 AM  Depression screen PHQ 2/9  Decreased Interest 0 0 0  Down, Depressed, Hopeless 0 0 0  PHQ - 2 Score 0 0 0  Altered sleeping 2 1 1   Tired, decreased energy 0 0 1  Change in appetite 1 0 0  Feeling bad or failure about yourself  0 0 1  Trouble concentrating 1 0 0  Moving slowly or fidgety/restless 1 0 0  Suicidal thoughts 0 0 0  PHQ-9 Score 5 1 3   Difficult doing work/chores Somewhat difficult Not difficult at all        05/03/2023    3:55 PM 12/14/2021    9:10 AM 06/12/2016    9:58 AM 03/02/2015    2:17 PM  GAD 7 : Generalized Anxiety Score  Nervous, Anxious, on Edge 1 1 2 3   Control/stop worrying 0 0 0 3  Worry too much - different things 0 0 1 3  Trouble relaxing 1 0 0 3  Restless 0 0 0 0  Easily annoyed or irritable 1 0 1 1  Afraid - awful might happen 0 0 1 3  Total GAD 7 Score 3 1 5 16   Anxiety Difficulty Not difficult at all Not difficult at all Not difficult at all Very difficult     Past Medical History:  Diagnosis Date   Allergy  Anxiety    Bipolar 1 disorder (HCC)    Depression    GERD (gastroesophageal reflux disease)    Hypertension    Seizures (HCC)    Sleep apnea    Past Surgical History:  Procedure Laterality Date   NO PAST SURGERIES     Social History   Socioeconomic History   Marital status: Single    Spouse name: Not on file   Number of children: Not on file   Years of education: Not on file   Highest education level: Bachelor's degree (e.g., BA, AB, BS)  Occupational History   Not on file  Tobacco Use   Smoking status: Former    Types: Cigars   Smokeless tobacco: Never   Tobacco comments:    Smoke Cigars occasionally in college  Vaping Use   Vaping status: Never Used  Substance and Sexual Activity   Alcohol use: Yes    Alcohol/week: 1.0  standard drink of alcohol    Types: 1 Standard drinks or equivalent per week   Drug use: Not Currently    Types: Marijuana   Sexual activity: Yes    Birth control/protection: Condom  Other Topics Concern   Not on file  Social History Narrative   Not on file   Social Drivers of Health   Financial Resource Strain: Low Risk  (05/03/2023)   Overall Financial Resource Strain (CARDIA)    Difficulty of Paying Living Expenses: Not hard at all  Food Insecurity: No Food Insecurity (05/03/2023)   Hunger Vital Sign    Worried About Running Out of Food in the Last Year: Never true    Ran Out of Food in the Last Year: Never true  Transportation Needs: No Transportation Needs (05/03/2023)   PRAPARE - Administrator, Civil Service (Medical): No    Lack of Transportation (Non-Medical): No  Physical Activity: Sufficiently Active (05/03/2023)   Exercise Vital Sign    Days of Exercise per Week: 4 days    Minutes of Exercise per Session: 60 min  Stress: No Stress Concern Present (05/03/2023)   [employer] of Occupational Health - Occupational Stress Questionnaire    Feeling of Stress : Only a little  Social Connections: Moderately Isolated (05/03/2023)   Social Connection and Isolation Panel [NHANES]    Frequency of Communication with Friends and Family: More than three times a week    Frequency of Social Gatherings with Friends and Family: More than three times a week    Attends Religious Services: 1 to 4 times per year    Active Member of [employer] or Organizations: No    Attends Engineer, Structural: Not on file    Marital Status: Never married  Catering Manager Violence: Not on file   Family History  Problem Relation Age of Onset   Heart disease Maternal Grandfather    Obesity Maternal Grandfather    Breast cancer Paternal Grandmother    Diabetes Father    Arthritis Father    Obesity Father    Hyperlipidemia Maternal Grandmother    Diabetes Paternal Grandfather    Heart  disease Paternal Grandfather    Hyperlipidemia Paternal Grandfather    Obesity Paternal Grandfather    Anxiety disorder Mother    Arthritis Mother    Hearing loss Mother    Obesity Mother    Depression Paternal Uncle    Colon cancer Neg Hx    Prostate cancer Neg Hx    Current Outpatient Medications on File Prior to Visit  Medication Sig   buPROPion (WELLBUTRIN XL) 150 MG 24 hr tablet Take 1 tablet (150 mg total) by mouth daily as needed (depression).   lamoTRIgine (LAMICTAL) 200 MG tablet Take 300 mg by mouth at bedtime. One and half tablet = 300mg    QUEtiapine (SEROQUEL) 100 MG tablet Take 100 mg by mouth at bedtime.   No current facility-administered medications on file prior to visit.    Review of Systems  Constitutional:  Negative for activity change, appetite change, chills, diaphoresis, fatigue and fever.  HENT:  Negative for congestion and hearing loss.   Eyes:  Negative for visual disturbance.  Respiratory:  Negative for cough, chest tightness, shortness of breath and wheezing.   Cardiovascular:  Negative for chest pain, palpitations and leg swelling.  Gastrointestinal:  Negative for abdominal pain, constipation, diarrhea, nausea and vomiting.  Genitourinary:  Negative for dysuria, frequency and hematuria.  Musculoskeletal:  Negative for arthralgias and neck pain.  Skin:  Negative for rash.  Neurological:  Negative for dizziness, weakness, light-headedness, numbness and headaches.  Hematological:  Negative for adenopathy.  Psychiatric/Behavioral:  Negative for behavioral problems, dysphoric mood and sleep disturbance.    Per HPI unless specifically indicated above     Objective:    BP 130/80 (BP Location: Left Arm, Cuff Size: Normal)   Pulse 90   Ht 5' 10 (1.778 m)   Wt 268 lb (121.6 kg)   SpO2 95%   BMI 38.45 kg/m   Wt Readings from Last 3 Encounters:  05/03/23 268 lb (121.6 kg)  12/14/21 270 lb (122.5 kg)  07/13/17 280 lb (127 kg)    Physical Exam Vitals  and nursing note reviewed.  Constitutional:      General: He is not in acute distress.    Appearance: He is well-developed. He is obese. He is not diaphoretic.     Comments: Well-appearing, comfortable, cooperative  HENT:     Head: Normocephalic and atraumatic.  Eyes:     General:        Right eye: No discharge.        Left eye: No discharge.     Conjunctiva/sclera: Conjunctivae normal.     Pupils: Pupils are equal, round, and reactive to light.  Neck:     Thyroid : No thyromegaly.     Vascular: No carotid bruit.  Cardiovascular:     Rate and Rhythm: Normal rate and regular rhythm.     Pulses: Normal pulses.     Heart sounds: Normal heart sounds. No murmur heard. Pulmonary:     Effort: Pulmonary effort is normal. No respiratory distress.     Breath sounds: Normal breath sounds. No wheezing or rales.  Abdominal:     General: Bowel sounds are normal. There is no distension.     Palpations: Abdomen is soft. There is no mass.     Tenderness: There is no abdominal tenderness.  Musculoskeletal:        General: No tenderness. Normal range of motion.     Cervical back: Normal range of motion and neck supple.     Right lower leg: Edema (trace) present.     Left lower leg: No edema.     Comments: Upper / Lower Extremities: - Normal muscle tone, strength bilateral upper extremities 5/5, lower extremities 5/5  Lymphadenopathy:     Cervical: No cervical adenopathy.  Skin:    General: Skin is warm and dry.     Findings: No erythema or rash.  Neurological:     Mental  Status: He is alert and oriented to person, place, and time.     Comments: Distal sensation intact to light touch all extremities  Psychiatric:        Mood and Affect: Mood normal.        Behavior: Behavior normal.        Thought Content: Thought content normal.     Comments: Well groomed, good eye contact, normal speech and thoughts     Results for orders placed or performed in visit on 12/14/21  COMPLETE METABOLIC  PANEL WITH GFR   Collection Time: 12/14/21  9:50 AM  Result Value Ref Range   Glucose, Bld 105 (H) 65 - 99 mg/dL   BUN 15 7 - 25 mg/dL   Creat 8.62 (H) 9.39 - 1.24 mg/dL   eGFR 72 > OR = 60 fO/fpw/8.26f7   BUN/Creatinine Ratio 11 6 - 22 (calc)   Sodium 141 135 - 146 mmol/L   Potassium 4.1 3.5 - 5.3 mmol/L   Chloride 103 98 - 110 mmol/L   CO2 28 20 - 32 mmol/L   Calcium 9.6 8.6 - 10.3 mg/dL   Total Protein 7.0 6.1 - 8.1 g/dL   Albumin 4.4 3.6 - 5.1 g/dL   Globulin 2.6 1.9 - 3.7 g/dL (calc)   AG Ratio 1.7 1.0 - 2.5 (calc)   Total Bilirubin 0.7 0.2 - 1.2 mg/dL   Alkaline phosphatase (APISO) 85 36 - 130 U/L   AST 16 10 - 40 U/L   ALT 32 9 - 46 U/L  CBC with Differential/Platelet   Collection Time: 12/14/21  9:50 AM  Result Value Ref Range   WBC 5.5 3.8 - 10.8 Thousand/uL   RBC 5.20 4.20 - 5.80 Million/uL   Hemoglobin 15.2 13.2 - 17.1 g/dL   HCT 56.1 61.4 - 49.9 %   MCV 84.2 80.0 - 100.0 fL   MCH 29.2 27.0 - 33.0 pg   MCHC 34.7 32.0 - 36.0 g/dL   RDW 86.8 88.9 - 84.9 %   Platelets 183 140 - 400 Thousand/uL   MPV 11.9 7.5 - 12.5 fL   Neutro Abs 2,926 1,500 - 7,800 cells/uL   Lymphs Abs 1,876 850 - 3,900 cells/uL   Absolute Monocytes 429 200 - 950 cells/uL   Eosinophils Absolute 231 15 - 500 cells/uL   Basophils Absolute 39 0 - 200 cells/uL   Neutrophils Relative % 53.2 %   Total Lymphocyte 34.1 %   Monocytes Relative 7.8 %   Eosinophils Relative 4.2 %   Basophils Relative 0.7 %  Lipid panel   Collection Time: 12/14/21  9:50 AM  Result Value Ref Range   Cholesterol 174 <200 mg/dL   HDL 47 > OR = 40 mg/dL   Triglycerides 856 <849 mg/dL   LDL Cholesterol (Calc) 103 (H) mg/dL (calc)   Total CHOL/HDL Ratio 3.7 <5.0 (calc)   Non-HDL Cholesterol (Calc) 127 <130 mg/dL (calc)  Hemoglobin J8r   Collection Time: 12/14/21  9:50 AM  Result Value Ref Range   Hgb A1c MFr Bld 5.2 <5.7 % of total Hgb   Mean Plasma Glucose 103 mg/dL   eAG (mmol/L) 5.7 mmol/L  TSH   Collection  Time: 12/14/21  9:50 AM  Result Value Ref Range   TSH 2.29 0.40 - 4.50 mIU/L      Assessment & Plan:   Problem List Items Addressed This Visit     Abnormal glucose   Relevant Orders   Hemoglobin A1c   Bipolar 1 disorder, mixed,  full remission (HCC)   Relevant Orders   COMPLETE METABOLIC PANEL WITH GFR   CBC with Differential/Platelet   Morbid obesity (HCC)   Other Visit Diagnoses       Annual physical exam    -  Primary   Relevant Orders   TSH   Lipid panel   Hemoglobin A1c   COMPLETE METABOLIC PANEL WITH GFR   CBC with Differential/Platelet     Family history of diabetes mellitus (DM)       Relevant Orders   Hemoglobin A1c     Elevated LDL cholesterol level       Relevant Orders   TSH   Lipid panel   COMPLETE METABOLIC PANEL WITH GFR     Long-term use of high-risk medication       Relevant Orders   TSH   Lipid panel   Hemoglobin A1c   COMPLETE METABOLIC PANEL WITH GFR   CBC with Differential/Platelet        Updated Health Maintenance information Fasting labs ordered for 1 week in advance Encouraged improvement to lifestyle with diet and exercise Goal of weight loss   Bipolar Disorder, Type 1 Followed by [facility] St[facility] on current regimen of Lamictal 300mg  (takes 1.5 of 200mg  tab) and Seroquel 100mg  (for sleep). Discussed cognitive decline and cloudiness potentially related to medication (likely seroquel side effect). Patient interested in neuropsychiatric evaluation or re-adjust medications in future. -Continue current medication regimen. -Consider locate new Psychiatry -Consider neuropsychiatric evaluation for cognitive concerns.  Morbid Obesity BMI >38 Weight Management Patient has been stable at current weight, with difficulty losing additional weight >1 year. Discussed likely impact of Seroquel on weight and potential alternatives. -Encourage continued lifestyle modifications for weight loss. -Discuss potential medication changes with  psychiatrist. Such as Vraylar  Hypertension Blood pressure slightly elevated during visit. Patient has home blood pressure cuff for monitoring. -Goal weight loss and encourage continued lifestyle modifications for blood pressure control. -Monitor blood pressure at home.  General Health Maintenance -Order routine blood work to be completed next week, including cholesterol, A1c, thyroid , chemistry, and blood counts. -Last tetanus shot was in April 2023. -Encourage COVID booster and flu shot at pharmacy.         Orders Placed This Encounter  Procedures   TSH    Standing Status:   Future    Expected Date:   05/10/2023    Expiration Date:   05/02/2024   Lipid panel    Standing Status:   Future    Expected Date:   05/10/2023    Expiration Date:   05/02/2024    Has the patient fasted?:   Yes   Hemoglobin A1c    Standing Status:   Future    Expected Date:   05/10/2023    Expiration Date:   05/02/2024   COMPLETE METABOLIC PANEL WITH GFR    Standing Status:   Future    Expected Date:   05/10/2023    Expiration Date:   05/02/2024   CBC with Differential/Platelet    Standing Status:   Future    Expected Date:   05/10/2023    Expiration Date:   05/02/2024    No orders of the defined types were placed in this encounter.    Follow up plan: Return for 1 year fasting lab > 1 week later Annual Physical.  Future labs 05/10/23 CMET LIPID A1c CBC TSH   Marsa Officer, DO [facility] 05/03/2023, 4:05 PM

## 2023-05-10 ENCOUNTER — Other Ambulatory Visit: Payer: Managed Care, Other (non HMO)

## 2023-05-10 DIAGNOSIS — E7800 Pure hypercholesterolemia, unspecified: Secondary | ICD-10-CM

## 2023-05-10 DIAGNOSIS — R7309 Other abnormal glucose: Secondary | ICD-10-CM

## 2023-05-10 DIAGNOSIS — F3178 Bipolar disorder, in full remission, most recent episode mixed: Secondary | ICD-10-CM

## 2023-05-10 DIAGNOSIS — Z833 Family history of diabetes mellitus: Secondary | ICD-10-CM

## 2023-05-10 DIAGNOSIS — Z79899 Other long term (current) drug therapy: Secondary | ICD-10-CM

## 2023-05-10 DIAGNOSIS — Z0000 Encounter for general adult medical examination without abnormal findings: Secondary | ICD-10-CM

## 2023-05-11 LAB — TSH: TSH: 4.38 m[IU]/L (ref 0.40–4.50)

## 2023-05-11 LAB — LIPID PANEL
Cholesterol: 142 mg/dL (ref <200)
HDL: 43 mg/dL (ref >=40)
LDL Cholesterol (Calc): 83 mg/dL
Non-HDL Cholesterol (Calc): 99 mg/dL (ref <130)
Total CHOL/HDL Ratio: 3.3 (calc) (ref <5.0)
Triglycerides: 78 mg/dL (ref <150)

## 2023-05-11 LAB — CBC WITH DIFFERENTIAL/PLATELET
Absolute Lymphocytes: 2384 {cells}/uL (ref 850–3900)
Absolute Monocytes: 401 {cells}/uL (ref 200–950)
Basophils Absolute: 41 {cells}/uL (ref 0–200)
Basophils Relative: 0.7 %
Eosinophils Absolute: 230 {cells}/uL (ref 15–500)
Eosinophils Relative: 3.9 %
HCT: 44.2 % (ref 38.5–50.0)
Hemoglobin: 14.8 g/dL (ref 13.2–17.1)
MCH: 29.2 pg (ref 27.0–33.0)
MCHC: 33.5 g/dL (ref 32.0–36.0)
MCV: 87.2 fL (ref 80.0–100.0)
MPV: 12.2 fL (ref 7.5–12.5)
Monocytes Relative: 6.8 %
Neutro Abs: 2844 {cells}/uL (ref 1500–7800)
Neutrophils Relative %: 48.2 %
Platelets: 201 10*3/uL (ref 140–400)
RBC: 5.07 10*6/uL (ref 4.20–5.80)
RDW: 13.1 % (ref 11.0–15.0)
Total Lymphocyte: 40.4 %
WBC: 5.9 10*3/uL (ref 3.8–10.8)

## 2023-05-11 LAB — COMPLETE METABOLIC PANEL WITH GFR
AG Ratio: 1.7 (calc) (ref 1.0–2.5)
ALT: 45 U/L (ref 9–46)
AST: 19 U/L (ref 10–40)
Albumin: 4.3 g/dL (ref 3.6–5.1)
Alkaline phosphatase (APISO): 79 U/L (ref 36–130)
BUN: 17 mg/dL (ref 7–25)
CO2: 29 mmol/L (ref 20–32)
Calcium: 9.4 mg/dL (ref 8.6–10.3)
Chloride: 104 mmol/L (ref 98–110)
Creat: 1.16 mg/dL (ref 0.60–1.26)
Globulin: 2.6 g/dL (ref 1.9–3.7)
Glucose, Bld: 93 mg/dL (ref 65–99)
Potassium: 4.1 mmol/L (ref 3.5–5.3)
Sodium: 141 mmol/L (ref 135–146)
Total Bilirubin: 0.4 mg/dL (ref 0.2–1.2)
Total Protein: 6.9 g/dL (ref 6.1–8.1)
eGFR: 87 mL/min/{1.73_m2} (ref >=60)

## 2023-05-11 LAB — HEMOGLOBIN A1C
Hgb A1c MFr Bld: 5.4 %{Hb} (ref <5.7)
Mean Plasma Glucose: 108 mg/dL
eAG (mmol/L): 6 mmol/L

## 2023-05-15 ENCOUNTER — Encounter: Payer: Self-pay | Admitting: Family Medicine

## 2023-05-15 ENCOUNTER — Other Ambulatory Visit: Payer: Self-pay | Admitting: Family Medicine

## 2023-05-15 DIAGNOSIS — E7800 Pure hypercholesterolemia, unspecified: Secondary | ICD-10-CM

## 2023-05-15 DIAGNOSIS — Z79899 Other long term (current) drug therapy: Secondary | ICD-10-CM

## 2023-05-15 DIAGNOSIS — R7309 Other abnormal glucose: Secondary | ICD-10-CM

## 2023-05-15 DIAGNOSIS — F3178 Bipolar disorder, in full remission, most recent episode mixed: Secondary | ICD-10-CM

## 2023-05-15 DIAGNOSIS — Z833 Family history of diabetes mellitus: Secondary | ICD-10-CM

## 2023-05-15 DIAGNOSIS — Z0000 Encounter for general adult medical examination without abnormal findings: Secondary | ICD-10-CM

## 2023-09-22 ENCOUNTER — Encounter: Payer: Self-pay | Admitting: Emergency Medicine

## 2023-09-22 ENCOUNTER — Ambulatory Visit
Admission: EM | Admit: 2023-09-22 | Discharge: 2023-09-22 | Disposition: A | Discharge status: Home / Self Care | Attending: Emergency Medicine | Admitting: Emergency Medicine

## 2023-09-22 ENCOUNTER — Ambulatory Visit (INDEPENDENT_AMBULATORY_CARE_PROVIDER_SITE_OTHER)

## 2023-09-22 DIAGNOSIS — R109 Unspecified abdominal pain: Secondary | ICD-10-CM | POA: Diagnosis not present

## 2023-09-22 DIAGNOSIS — N200 Calculus of kidney: Secondary | ICD-10-CM | POA: Insufficient documentation

## 2023-09-22 LAB — URINALYSIS, W/ REFLEX TO CULTURE (INFECTION SUSPECTED)
Glucose, UA: NEGATIVE mg/dL
Ketones, ur: NEGATIVE mg/dL
Leukocytes,Ua: NEGATIVE
Nitrite: NEGATIVE
Protein, ur: 100 mg/dL — AB
RBC / HPF: >50 RBC/hpf (ref 0–5)
Specific Gravity, Urine: >1.03 — ABNORMAL HIGH (ref 1.005–1.030)
Squamous Epithelial / HPF: NONE SEEN /HPF (ref 0–5)
pH: 5.5 (ref 5.0–8.0)

## 2023-09-22 MED ORDER — ONDANSETRON 4 MG PO TBDP
4.0000 mg | ORAL_TABLET | Freq: Once | ORAL | Status: AC
  Administered 2023-09-22: 4 mg via ORAL

## 2023-09-22 MED ORDER — KETOROLAC TROMETHAMINE 10 MG PO TABS: 10.0000 mg | ORAL_TABLET | Freq: Four times a day (QID) | ORAL | 0 refills | Status: DC | PRN

## 2023-09-22 MED ORDER — ONDANSETRON 4 MG PO TBDP: 4.0000 mg | ORAL_TABLET | Freq: Three times a day (TID) | ORAL | 0 refills | Status: DC | PRN

## 2023-09-22 MED ORDER — KETOROLAC TROMETHAMINE 30 MG/ML IJ SOLN
30.0000 mg | Freq: Once | INTRAMUSCULAR | Status: AC
  Administered 2023-09-22: 30 mg via INTRAMUSCULAR

## 2023-09-22 NOTE — Discharge Instructions (Addendum)
 As we discussed, I think you are currently passing a kidney stone.  Strain all of your urine to see if you catch a stone.  It may look like a piece of gravel or it may look like a piece of ice, depending on where the stone is made of.  Save it so that it can be analyzed later to determine any possible preventative measures to prevent new stones from forming.  Increase your oral fluid intake such increased urine production to help flush your urinary tract and push the stone along.  Use the Toradol every 6 hours with food as needed for pain.  You may use the Zofran  every 8 hours as needed for nausea.  I have referred you to urology for further evaluation and to discuss treatment options.  If you have any increase in pain, develop nausea or vomiting where you cannot keep down fluids or medications, you see blood in your urine, or you start running fevers you need to go to the ER for evaluation.

## 2023-09-22 NOTE — ED Triage Notes (Addendum)
 Patient c/o left sided flank pain, dysuria, urinary frequency that started morning around 8:15am.  Patient reports some nausea.  Patient states that he thought it was constipation so he took Pepto and also Colace today.

## 2023-09-22 NOTE — ED Provider Notes (Signed)
 [facility]    CSN: [id number] Arrival date & time: 09/22/23  1030      History   Chief Complaint Chief Complaint  Patient presents with   Flank Pain    left   Dysuria    HPI Chase Hampton is a 33 y.o. male.   HPI  33 year old male with past medical history significant for morbid obesity, bipolar 1 disorder, hyperlipidemia, eczema, and neuroleptic malignant syndrome presents for evaluation of sudden onset of left flank pain with associated pain with urination and urinary frequency.  He also reports that he had significant diaphoresis and nausea at the time.  He reports that the pain at the time was a 10/10 and is currently rating it as a 9/10.  He reports he thought it might be constipation so he took some Pepto-Bismol and Colace.  No history of kidney stones.  He has not observed blood in his urine.  Past Medical History:  Diagnosis Date   Allergy    Anxiety    Bipolar 1 disorder (HCC)    Depression    GERD (gastroesophageal reflux disease)    Hypertension    Seizures (HCC)    Sleep apnea     Patient Active Problem List   Diagnosis Date Noted   Morbid obesity (HCC) 05/03/2023   Bipolar 1 disorder, mixed, full remission (HCC) 12/14/2021   Tachycardia 06/12/2016   Skin lesion of left arm 06/12/2016   Abnormal glucose 06/12/2016   Hyperlipidemia 06/12/2016   Eczema 08/15/2015   History of neuroleptic malignant syndrome     Past Surgical History:  Procedure Laterality Date   NO PAST SURGERIES     WISDOM TOOTH EXTRACTION         Home Medications    Prior to Admission medications   Medication Sig Start Date End Date Taking? Authorizing Provider  ketorolac (TORADOL) 10 MG tablet Take 1 tablet (10 mg total) by mouth every 6 (six) hours as needed. 09/22/23  Yes Bernardino Ditch, NP  lamoTRIgine (LAMICTAL) 200 MG tablet Take 300 mg by mouth at bedtime. One and half tablet = 300mg  09/29/21  Yes [provider]  ondansetron  (ZOFRAN -ODT) 4 MG  disintegrating tablet Take 1 tablet (4 mg total) by mouth every 8 (eight) hours as needed for nausea or vomiting. 09/22/23  Yes Bernardino Ditch, NP  traZODone  (DESYREL ) 100 MG tablet Take 50-200 mg by mouth at bedtime as needed. 08/21/23  Yes [provider]    Family History Family History  Problem Relation Age of Onset   Heart disease Maternal Grandfather    Obesity Maternal Grandfather    Breast cancer Paternal Grandmother    Diabetes Father    Arthritis Father    Obesity Father    Hyperlipidemia Maternal Grandmother    Diabetes Paternal Grandfather    Heart disease Paternal Grandfather    Hyperlipidemia Paternal Grandfather    Obesity Paternal Grandfather    Anxiety disorder Mother    Arthritis Mother    Hearing loss Mother    Obesity Mother    Depression Paternal Uncle    Colon cancer Neg Hx    Prostate cancer Neg Hx     Social History Social History   Tobacco Use   Smoking status: Former    Types: Cigars   Smokeless tobacco: Never   Tobacco comments:    Smoke Cigars occasionally in college  Vaping Use   Vaping status: Never Used  Substance Use Topics   Alcohol use: Yes  Alcohol/week: 1.0 standard drink of alcohol    Types: 1 Standard drinks or equivalent per week   Drug use: Not Currently    Types: Marijuana     Allergies   Haldol  [haloperidol  lactate]   Review of Systems Review of Systems  Constitutional:  Positive for diaphoresis. Negative for fever.  Gastrointestinal:  Positive for nausea. Negative for vomiting.  Genitourinary:  Positive for dysuria, flank pain and frequency. Negative for hematuria and urgency.     Physical Exam Triage Vital Signs ED Triage Vitals  Encounter Vitals Group     BP      Girls Systolic BP Percentile      Girls Diastolic BP Percentile      Boys Systolic BP Percentile      Boys Diastolic BP Percentile      Pulse      Resp      Temp      Temp src      SpO2      Weight      Height      Head  Circumference      Peak Flow      Pain Score      Pain Loc      Pain Education      Exclude from Growth Chart    No data found.  Updated Vital Signs BP 121/89 (BP Location: Left Arm)   Pulse 61   Temp (!) 97.4 F (36.3 C) (Temporal)   Resp 20   Ht 5' 10 (1.778 m)   Wt 268 lb 1.3 oz (121.6 kg)   SpO2 100%   BMI 38.47 kg/m   Visual Acuity Right Eye Distance:   Left Eye Distance:   Bilateral Distance:    Right Eye Near:   Left Eye Near:    Bilateral Near:     Physical Exam Vitals and nursing note reviewed.  Constitutional:      General: He is in acute distress.     Appearance: Normal appearance. He is not ill-appearing.  HENT:     Head: Normocephalic and atraumatic.   Cardiovascular:     Rate and Rhythm: Normal rate and regular rhythm.     Pulses: Normal pulses.     Heart sounds: Normal heart sounds. No murmur heard.    No friction rub. No gallop.  Pulmonary:     Effort: Pulmonary effort is normal.     Breath sounds: Normal breath sounds. No wheezing, rhonchi or rales.  Abdominal:     Tenderness: There is no right CVA tenderness or left CVA tenderness.   Skin:    General: Skin is warm.     Capillary Refill: Capillary refill takes less than 2 seconds.     Comments: Clammy   Neurological:     General: No focal deficit present.     Mental Status: He is alert and oriented to person, place, and time.      UC Treatments / Results  Labs (all labs ordered are listed, but only abnormal results are displayed) Labs Reviewed  URINALYSIS, W/ REFLEX TO CULTURE (INFECTION SUSPECTED) - Abnormal; Notable for the following components:      Result Value   APPearance HAZY (*)    Specific Gravity, Urine >1.030 (*)    Hgb urine dipstick MODERATE (*)    Bilirubin Urine SMALL (*)    Protein, ur 100 (*)    Bacteria, UA RARE (*)    All other components within normal limits  EKG   Radiology DG Abdomen 1 View Result Date: 09/22/2023 CLINICAL DATA:  Sudden onset  left flank pain with associated sweating and nausea. EXAM: ABDOMEN - 1 VIEW COMPARISON:  May 24, 2015 FINDINGS: Nonobstructive bowel gas pattern. No pneumoperitoneum. No organomegaly. 4 mm calcification in the region of the left renal fossa. Small calcification in the right pelvis measuring 3 mm, possibly a pelvic phlebolith. No acute fracture or destructive lesion. The lung bases are clear. IMPRESSION: 1. Small calcification in the region of the left renal fossa measuring 4 mm, likely a renal calculus. Right pelvic calcification measuring 3 mm, possibly a bladder calculus or phlebolith. Alternatively, this could conceivably represent a right ureteral calculus. 2. Nonobstructive bowel gas pattern. Electronically Signed   By: Rogelia Myers M.D.   On: 09/22/2023 12:08    Procedures Procedures (including critical care time)  Medications Ordered in UC Medications  ketorolac (TORADOL) 30 MG/ML injection 30 mg (30 mg Intramuscular Given 09/22/23 1136)  ondansetron  (ZOFRAN -ODT) disintegrating tablet 4 mg (4 mg Oral Given 09/22/23 1136)    Initial Impression / Assessment and Plan / UC Course  I have reviewed the triage vital signs and the nursing notes.  Pertinent labs & imaging results that were available during my care of the patient were reviewed by me and considered in my medical decision making (see chart for details).   Patient is a pleasant 33 year old male who was in a moderate degree of pain presenting for evaluation of acute onset of left flank pain with associated dysuria and urinary frequency.  Also nausea and diaphoresis.  He is not currently diaphoretic though he is clammy.  Cardiopulmonary exam Nischal and sounds in all fields.  He does not have any CVA tenderness on exam.  He is antsy and unable to sit still on the stretcher due to the fact that he is uncomfortable.  He was able to provide a urine specimen which is brownish in color.  I will order 30 mg of IM Toradol, 4 mg of ODT  Zofran , and a KUB to assess for possible renal stone.  Additionally, a urinalysis to assess for the presence of infection.  Urinalysis has a hazy appearance with a high specific gravity of >1.030, moderate hemoglobin, small bilirubin, 100 protein.  Negative leukocyte esterase and nitrites.  Reflex microscopy shows >50 RBCs and rare bacteria.  KUB independently reviewed and evaluated by me.  Impression: Small radiolucency at the proximal end of the left ureter.  Scattered stool throughout the proximal, transverse, and descending colon.  There is also a radiolucency in the pelvis favored to be possible phlebolith.  Radiology read is pending. Radiology impression states small calcification in the region of the left renal fossa measuring 4 mm, likely a renal calculus.  Right pelvic calcification measuring 3 mm, possibly a bladder calculus or phlebolith.  Alternatively, this could conceivably represent a right ureteral calculus.  Nonobstructive bowel gas pattern.  I suspect that the patient is passing a left renal stone given the location of the pain, type of pain, and significant blood in his urine.  I we will discharge him home with urine strainers to filter his urine to see if he can catch a stone.  Given that the stone is so high Flomax is controversial so I will not prescribe it at this time    Final Clinical Impressions(s) / UC Diagnoses   Final diagnoses:  Left flank pain  Left renal stone     Discharge Instructions  As we discussed, I think you are currently passing a kidney stone.  Strain all of your urine to see if you catch a stone.  It may look like a piece of gravel or it may look like a piece of ice, depending on where the stone is made of.  Save it so that it can be analyzed later to determine any possible preventative measures to prevent new stones from forming.  Increase your oral fluid intake such increased urine production to help flush your urinary tract and push the  stone along.  Use the Toradol every 6 hours with food as needed for pain.  You may use the Zofran  every 8 hours as needed for nausea.  I have referred you to urology for further evaluation and to discuss treatment options.  If you have any increase in pain, develop nausea or vomiting where you cannot keep down fluids or medications, you see blood in your urine, or you start running fevers you need to go to the ER for evaluation.     ED Prescriptions     Medication Sig Dispense Auth. Provider   ketorolac (TORADOL) 10 MG tablet Take 1 tablet (10 mg total) by mouth every 6 (six) hours as needed. 20 tablet Bernardino Ditch, NP   ondansetron  (ZOFRAN -ODT) 4 MG disintegrating tablet Take 1 tablet (4 mg total) by mouth every 8 (eight) hours as needed for nausea or vomiting. 20 tablet Bernardino Ditch, NP      PDMP not reviewed this encounter.   Bernardino Ditch, NP 09/22/23 1220

## 2023-10-16 ENCOUNTER — Ambulatory Visit: Admitting: Urology

## 2023-10-21 ENCOUNTER — Other Ambulatory Visit: Payer: Self-pay

## 2023-10-21 DIAGNOSIS — N200 Calculus of kidney: Secondary | ICD-10-CM

## 2023-10-22 ENCOUNTER — Other Ambulatory Visit
Admission: RE | Admit: 2023-10-22 | Discharge: 2023-10-22 | Disposition: A | Discharge status: Home / Self Care | Attending: Urology | Admitting: Urology

## 2023-10-22 ENCOUNTER — Ambulatory Visit (INDEPENDENT_AMBULATORY_CARE_PROVIDER_SITE_OTHER): Admitting: Urology

## 2023-10-22 VITALS — BP 121/82 | HR 87 | Ht 69.0 in | Wt 278.2 lb

## 2023-10-22 DIAGNOSIS — N200 Calculus of kidney: Secondary | ICD-10-CM | POA: Diagnosis present

## 2023-10-22 LAB — URINALYSIS, COMPLETE (UACMP) WITH MICROSCOPIC
Bilirubin Urine: NEGATIVE
Glucose, UA: NEGATIVE mg/dL
Hgb urine dipstick: NEGATIVE
Ketones, ur: NEGATIVE mg/dL
Leukocytes,Ua: NEGATIVE
Nitrite: NEGATIVE
Protein, ur: NEGATIVE mg/dL
Specific Gravity, Urine: >1.03 — ABNORMAL HIGH (ref 1.005–1.030)
pH: 5.5 (ref 5.0–8.0)

## 2023-10-22 NOTE — Patient Instructions (Signed)
 [facility]

## 2023-10-22 NOTE — Progress Notes (Signed)
   10/22/23 4:30 PM   Chase Hampton 11/06/1990 [id number]  CC: Left ureteral stone  HPI: 33 year old male who presented to urgent care on 09/22/2023 with left-sided flank pain.  X-ray showed a 3 mm left proximal ureteral stone, urinalysis showed microscopic hematuria.  He passed a stone a few days later and symptoms have resolved.   PMH: Past Medical History:  Diagnosis Date   Allergy    Anxiety    Bipolar 1 disorder (HCC)    Depression    GERD (gastroesophageal reflux disease)    Hypertension    Seizures (HCC)    Sleep apnea     Surgical History: Past Surgical History:  Procedure Laterality Date   NO PAST SURGERIES     WISDOM TOOTH EXTRACTION        Family History: Family History  Problem Relation Age of Onset   Heart disease Maternal Grandfather    Obesity Maternal Grandfather    Breast cancer Paternal Grandmother    Diabetes Father    Arthritis Father    Obesity Father    Hyperlipidemia Maternal Grandmother    Diabetes Paternal Grandfather    Heart disease Paternal Grandfather    Hyperlipidemia Paternal Grandfather    Obesity Paternal Grandfather    Anxiety disorder Mother    Arthritis Mother    Hearing loss Mother    Obesity Mother    Depression Paternal Uncle    Colon cancer Neg Hx    Prostate cancer Neg Hx     Social History:  reports that he has quit smoking. His smoking use included cigars. He has never used smokeless tobacco. He reports current alcohol use of about 1.0 standard drink of alcohol per week. He reports that he does not currently use drugs after having used the following drugs: Marijuana.  Physical Exam: BP 121/82 (BP Location: Left Arm, Patient Position: Sitting, Cuff Size: Large)   Pulse 87   Ht 5' 9 (1.753 m)   Wt 278 lb 3.2 oz (126.2 kg)   SpO2 96%   BMI 41.08 kg/m    Constitutional:  Alert and oriented, No acute distress. Cardiovascular: No clubbing, cyanosis, or edema. Respiratory: Normal respiratory effort, no  increased work of breathing. GI: Abdomen is soft, nontender, nondistended, no abdominal masses  Assessment & Plan:   33 year old male with spontaneously passed 3 mm left-sided ureteral stone.  We discussed general stone prevention strategies including adequate hydration with goal of producing 2.5 L of urine daily, increasing citric acid intake, increasing calcium intake during high oxalate meals, minimizing animal protein, and decreasing salt intake. Information about dietary recommendations given today.   He prefers follow-up as needed  Redell Burnet, MD 10/22/2023  [facility] [address], [address] [place], [place] [address] [phone]

## 2024-04-30 ENCOUNTER — Telehealth: Payer: Self-pay

## 2024-04-30 NOTE — Telephone Encounter (Signed)
 Left message for patient to return call To reschedule appointment

## 2024-04-30 NOTE — Telephone Encounter (Signed)
 Copied from CRM [id number]. Topic: Appointments - Scheduling Inquiry for Clinic >> Apr 30, 2024  2:57 PM Chase Hampton wrote: Reason for CRM: patient is calling to r/s his physical per [facility]. Next avail is April but he has lab work tomorrow for the physical. Please fu with pt

## 2024-05-01 ENCOUNTER — Other Ambulatory Visit: Payer: Managed Care, Other (non HMO)

## 2024-05-01 DIAGNOSIS — E7800 Pure hypercholesterolemia, unspecified: Secondary | ICD-10-CM

## 2024-05-01 DIAGNOSIS — Z0000 Encounter for general adult medical examination without abnormal findings: Secondary | ICD-10-CM

## 2024-05-01 DIAGNOSIS — Z833 Family history of diabetes mellitus: Secondary | ICD-10-CM

## 2024-05-01 DIAGNOSIS — Z79899 Other long term (current) drug therapy: Secondary | ICD-10-CM

## 2024-05-01 DIAGNOSIS — F3178 Bipolar disorder, in full remission, most recent episode mixed: Secondary | ICD-10-CM

## 2024-05-01 DIAGNOSIS — R7309 Other abnormal glucose: Secondary | ICD-10-CM

## 2024-05-01 LAB — COMPLETE METABOLIC PANEL WITHOUT GFR
AG Ratio: 1.9 (calc) (ref 1.0–2.5)
ALT: 38 U/L (ref 9–46)
AST: 15 U/L (ref 10–40)
Albumin: 4.5 g/dL (ref 3.6–5.1)
Alkaline phosphatase (APISO): 90 U/L (ref 36–130)
BUN: 18 mg/dL (ref 7–25)
CO2: 27 mmol/L (ref 20–32)
Calcium: 9.7 mg/dL (ref 8.6–10.3)
Chloride: 104 mmol/L (ref 98–110)
Creat: 1.08 mg/dL (ref 0.60–1.26)
Globulin: 2.4 g/dL (ref 1.9–3.7)
Glucose, Bld: 94 mg/dL (ref 65–99)
Potassium: 4.2 mmol/L (ref 3.5–5.3)
Sodium: 140 mmol/L (ref 135–146)
Total Bilirubin: 0.8 mg/dL (ref 0.2–1.2)
Total Protein: 6.9 g/dL (ref 6.1–8.1)

## 2024-05-01 LAB — CBC WITH DIFFERENTIAL/PLATELET
Absolute Lymphocytes: 2448 {cells}/uL (ref 850–3900)
Absolute Monocytes: 517 {cells}/uL (ref 200–950)
Basophils Absolute: 27 {cells}/uL (ref 0–200)
Basophils Relative: 0.4 %
Eosinophils Absolute: 313 {cells}/uL (ref 15–500)
Eosinophils Relative: 4.6 %
HCT: 43.1 % (ref 39.4–51.1)
Hemoglobin: 14.9 g/dL (ref 13.2–17.1)
MCH: 29.3 pg (ref 27.0–33.0)
MCHC: 34.6 g/dL (ref 31.6–35.4)
MCV: 84.8 fL (ref 81.4–101.7)
MPV: 12.3 fL (ref 7.5–12.5)
Monocytes Relative: 7.6 %
Neutro Abs: 3495 {cells}/uL (ref 1500–7800)
Neutrophils Relative %: 51.4 %
Platelets: 203 10*3/uL (ref 140–400)
RBC: 5.08 Million/uL (ref 4.20–5.80)
RDW: 13.7 % (ref 11.0–15.0)
Total Lymphocyte: 36 %
WBC: 6.8 10*3/uL (ref 3.8–10.8)

## 2024-05-01 LAB — LIPID PANEL
Cholesterol: 144 mg/dL
HDL: 35 mg/dL — ABNORMAL LOW
LDL Cholesterol (Calc): 87 mg/dL
Non-HDL Cholesterol (Calc): 109 mg/dL
Total CHOL/HDL Ratio: 4.1 (calc)
Triglycerides: 119 mg/dL

## 2024-05-01 LAB — TSH: TSH: 2.76 m[IU]/L (ref 0.40–4.50)

## 2024-05-08 ENCOUNTER — Encounter: Payer: Managed Care, Other (non HMO) | Admitting: Family Medicine

## 2024-05-13 ENCOUNTER — Encounter: Admitting: Family Medicine
